# Patient Record
Sex: Male | Born: 1955
Health system: Southern US, Community
[De-identification: ages and names within clinical notes are randomized; demographics above are authoritative.]

## PROBLEM LIST (undated history)

## (undated) DIAGNOSIS — I10 Essential (primary) hypertension: Secondary | ICD-10-CM

## (undated) DIAGNOSIS — M1990 Unspecified osteoarthritis, unspecified site: Secondary | ICD-10-CM

## (undated) DIAGNOSIS — I639 Cerebral infarction, unspecified: Secondary | ICD-10-CM

## (undated) DIAGNOSIS — E785 Hyperlipidemia, unspecified: Secondary | ICD-10-CM

## (undated) DIAGNOSIS — F17200 Nicotine dependence, unspecified, uncomplicated: Secondary | ICD-10-CM

---

## 2004-09-30 ENCOUNTER — Ambulatory Visit (HOSPITAL_COMMUNITY): Admission: RE | Admit: 2004-09-30 | Discharge: 2004-09-30 | Payer: Self-pay | Admitting: Gastroenterology

## 2012-03-18 ENCOUNTER — Ambulatory Visit: Payer: Self-pay

## 2013-07-08 DIAGNOSIS — I10 Essential (primary) hypertension: Secondary | ICD-10-CM | POA: Diagnosis not present

## 2013-07-08 DIAGNOSIS — E782 Mixed hyperlipidemia: Secondary | ICD-10-CM | POA: Diagnosis not present

## 2013-07-08 DIAGNOSIS — R7309 Other abnormal glucose: Secondary | ICD-10-CM | POA: Diagnosis not present

## 2014-01-07 DIAGNOSIS — R7309 Other abnormal glucose: Secondary | ICD-10-CM | POA: Diagnosis not present

## 2014-01-07 DIAGNOSIS — Z125 Encounter for screening for malignant neoplasm of prostate: Secondary | ICD-10-CM | POA: Diagnosis not present

## 2014-01-07 DIAGNOSIS — I10 Essential (primary) hypertension: Secondary | ICD-10-CM | POA: Diagnosis not present

## 2014-01-07 DIAGNOSIS — M545 Low back pain, unspecified: Secondary | ICD-10-CM | POA: Diagnosis not present

## 2014-01-07 DIAGNOSIS — E782 Mixed hyperlipidemia: Secondary | ICD-10-CM | POA: Diagnosis not present

## 2014-07-10 DIAGNOSIS — R739 Hyperglycemia, unspecified: Secondary | ICD-10-CM | POA: Diagnosis not present

## 2014-07-10 DIAGNOSIS — Z23 Encounter for immunization: Secondary | ICD-10-CM | POA: Diagnosis not present

## 2014-07-10 DIAGNOSIS — M545 Low back pain: Secondary | ICD-10-CM | POA: Diagnosis not present

## 2014-07-10 DIAGNOSIS — Z9181 History of falling: Secondary | ICD-10-CM | POA: Diagnosis not present

## 2014-07-10 DIAGNOSIS — R7309 Other abnormal glucose: Secondary | ICD-10-CM | POA: Diagnosis not present

## 2014-07-10 DIAGNOSIS — E782 Mixed hyperlipidemia: Secondary | ICD-10-CM | POA: Diagnosis not present

## 2014-07-10 DIAGNOSIS — I10 Essential (primary) hypertension: Secondary | ICD-10-CM | POA: Diagnosis not present

## 2015-07-30 DIAGNOSIS — Z5320 Procedure and treatment not carried out because of patient's decision for unspecified reasons: Secondary | ICD-10-CM | POA: Diagnosis not present

## 2015-07-30 DIAGNOSIS — E782 Mixed hyperlipidemia: Secondary | ICD-10-CM | POA: Diagnosis not present

## 2015-07-30 DIAGNOSIS — Z720 Tobacco use: Secondary | ICD-10-CM | POA: Diagnosis not present

## 2015-07-30 DIAGNOSIS — Z6832 Body mass index (BMI) 32.0-32.9, adult: Secondary | ICD-10-CM | POA: Diagnosis not present

## 2015-07-30 DIAGNOSIS — M545 Low back pain: Secondary | ICD-10-CM | POA: Diagnosis not present

## 2015-07-30 DIAGNOSIS — Z1389 Encounter for screening for other disorder: Secondary | ICD-10-CM | POA: Diagnosis not present

## 2015-07-30 DIAGNOSIS — I10 Essential (primary) hypertension: Secondary | ICD-10-CM | POA: Diagnosis not present

## 2015-07-30 DIAGNOSIS — R7303 Prediabetes: Secondary | ICD-10-CM | POA: Diagnosis not present

## 2015-07-30 DIAGNOSIS — E669 Obesity, unspecified: Secondary | ICD-10-CM | POA: Diagnosis not present

## 2015-07-30 DIAGNOSIS — Z23 Encounter for immunization: Secondary | ICD-10-CM | POA: Diagnosis not present

## 2016-03-07 DIAGNOSIS — I10 Essential (primary) hypertension: Secondary | ICD-10-CM | POA: Diagnosis not present

## 2016-03-07 DIAGNOSIS — Z720 Tobacco use: Secondary | ICD-10-CM | POA: Diagnosis not present

## 2016-03-07 DIAGNOSIS — F17200 Nicotine dependence, unspecified, uncomplicated: Secondary | ICD-10-CM | POA: Diagnosis not present

## 2016-03-07 DIAGNOSIS — Z79899 Other long term (current) drug therapy: Secondary | ICD-10-CM | POA: Diagnosis not present

## 2016-03-07 DIAGNOSIS — E782 Mixed hyperlipidemia: Secondary | ICD-10-CM | POA: Diagnosis not present

## 2016-03-07 DIAGNOSIS — Z6832 Body mass index (BMI) 32.0-32.9, adult: Secondary | ICD-10-CM | POA: Diagnosis not present

## 2016-03-07 DIAGNOSIS — M545 Low back pain: Secondary | ICD-10-CM | POA: Diagnosis not present

## 2016-03-07 DIAGNOSIS — R7303 Prediabetes: Secondary | ICD-10-CM | POA: Diagnosis not present

## 2016-03-07 DIAGNOSIS — Z125 Encounter for screening for malignant neoplasm of prostate: Secondary | ICD-10-CM | POA: Diagnosis not present

## 2016-07-06 DIAGNOSIS — Z23 Encounter for immunization: Secondary | ICD-10-CM | POA: Diagnosis not present

## 2016-10-25 DIAGNOSIS — Z9181 History of falling: Secondary | ICD-10-CM | POA: Diagnosis not present

## 2016-10-25 DIAGNOSIS — I10 Essential (primary) hypertension: Secondary | ICD-10-CM | POA: Diagnosis not present

## 2016-10-25 DIAGNOSIS — Z1389 Encounter for screening for other disorder: Secondary | ICD-10-CM | POA: Diagnosis not present

## 2016-10-25 DIAGNOSIS — Z6832 Body mass index (BMI) 32.0-32.9, adult: Secondary | ICD-10-CM | POA: Diagnosis not present

## 2016-10-25 DIAGNOSIS — M545 Low back pain: Secondary | ICD-10-CM | POA: Diagnosis not present

## 2016-10-25 DIAGNOSIS — E669 Obesity, unspecified: Secondary | ICD-10-CM | POA: Diagnosis not present

## 2016-10-25 DIAGNOSIS — R7303 Prediabetes: Secondary | ICD-10-CM | POA: Diagnosis not present

## 2016-10-25 DIAGNOSIS — Z720 Tobacco use: Secondary | ICD-10-CM | POA: Diagnosis not present

## 2016-10-25 DIAGNOSIS — E782 Mixed hyperlipidemia: Secondary | ICD-10-CM | POA: Diagnosis not present

## 2017-05-15 DIAGNOSIS — Z1389 Encounter for screening for other disorder: Secondary | ICD-10-CM | POA: Diagnosis not present

## 2017-05-15 DIAGNOSIS — M545 Low back pain: Secondary | ICD-10-CM | POA: Diagnosis not present

## 2017-05-15 DIAGNOSIS — Z0000 Encounter for general adult medical examination without abnormal findings: Secondary | ICD-10-CM | POA: Diagnosis not present

## 2017-05-15 DIAGNOSIS — R7303 Prediabetes: Secondary | ICD-10-CM | POA: Diagnosis not present

## 2017-05-15 DIAGNOSIS — Z136 Encounter for screening for cardiovascular disorders: Secondary | ICD-10-CM | POA: Diagnosis not present

## 2017-05-15 DIAGNOSIS — E785 Hyperlipidemia, unspecified: Secondary | ICD-10-CM | POA: Diagnosis not present

## 2017-05-15 DIAGNOSIS — Z6832 Body mass index (BMI) 32.0-32.9, adult: Secondary | ICD-10-CM | POA: Diagnosis not present

## 2017-05-15 DIAGNOSIS — Z125 Encounter for screening for malignant neoplasm of prostate: Secondary | ICD-10-CM | POA: Diagnosis not present

## 2017-05-15 DIAGNOSIS — E782 Mixed hyperlipidemia: Secondary | ICD-10-CM | POA: Diagnosis not present

## 2017-05-15 DIAGNOSIS — E669 Obesity, unspecified: Secondary | ICD-10-CM | POA: Diagnosis not present

## 2017-05-15 DIAGNOSIS — I10 Essential (primary) hypertension: Secondary | ICD-10-CM | POA: Diagnosis not present

## 2017-06-08 DIAGNOSIS — Z23 Encounter for immunization: Secondary | ICD-10-CM | POA: Diagnosis not present

## 2017-12-04 DIAGNOSIS — E782 Mixed hyperlipidemia: Secondary | ICD-10-CM | POA: Diagnosis not present

## 2017-12-04 DIAGNOSIS — Z79899 Other long term (current) drug therapy: Secondary | ICD-10-CM | POA: Diagnosis not present

## 2017-12-12 DIAGNOSIS — R7303 Prediabetes: Secondary | ICD-10-CM | POA: Diagnosis not present

## 2017-12-12 DIAGNOSIS — M545 Low back pain: Secondary | ICD-10-CM | POA: Diagnosis not present

## 2017-12-12 DIAGNOSIS — E782 Mixed hyperlipidemia: Secondary | ICD-10-CM | POA: Diagnosis not present

## 2017-12-12 DIAGNOSIS — I10 Essential (primary) hypertension: Secondary | ICD-10-CM | POA: Diagnosis not present

## 2018-06-06 DIAGNOSIS — E782 Mixed hyperlipidemia: Secondary | ICD-10-CM | POA: Diagnosis not present

## 2018-06-06 DIAGNOSIS — Z79899 Other long term (current) drug therapy: Secondary | ICD-10-CM | POA: Diagnosis not present

## 2018-06-06 DIAGNOSIS — Z125 Encounter for screening for malignant neoplasm of prostate: Secondary | ICD-10-CM | POA: Diagnosis not present

## 2018-06-26 DIAGNOSIS — I10 Essential (primary) hypertension: Secondary | ICD-10-CM | POA: Diagnosis not present

## 2018-06-26 DIAGNOSIS — E876 Hypokalemia: Secondary | ICD-10-CM | POA: Diagnosis not present

## 2018-06-26 DIAGNOSIS — E782 Mixed hyperlipidemia: Secondary | ICD-10-CM | POA: Diagnosis not present

## 2018-06-26 DIAGNOSIS — R7303 Prediabetes: Secondary | ICD-10-CM | POA: Diagnosis not present

## 2018-06-26 DIAGNOSIS — Z1331 Encounter for screening for depression: Secondary | ICD-10-CM | POA: Diagnosis not present

## 2018-12-23 DIAGNOSIS — Z79899 Other long term (current) drug therapy: Secondary | ICD-10-CM | POA: Diagnosis not present

## 2018-12-23 DIAGNOSIS — E782 Mixed hyperlipidemia: Secondary | ICD-10-CM | POA: Diagnosis not present

## 2018-12-26 DIAGNOSIS — E782 Mixed hyperlipidemia: Secondary | ICD-10-CM | POA: Diagnosis not present

## 2018-12-26 DIAGNOSIS — M545 Low back pain: Secondary | ICD-10-CM | POA: Diagnosis not present

## 2018-12-26 DIAGNOSIS — R7303 Prediabetes: Secondary | ICD-10-CM | POA: Diagnosis not present

## 2018-12-26 DIAGNOSIS — I10 Essential (primary) hypertension: Secondary | ICD-10-CM | POA: Diagnosis not present

## 2019-06-18 ENCOUNTER — Other Ambulatory Visit: Payer: Self-pay

## 2019-09-16 DIAGNOSIS — J358 Other chronic diseases of tonsils and adenoids: Secondary | ICD-10-CM | POA: Diagnosis not present

## 2019-09-16 DIAGNOSIS — J029 Acute pharyngitis, unspecified: Secondary | ICD-10-CM | POA: Diagnosis not present

## 2019-09-16 DIAGNOSIS — Z20822 Contact with and (suspected) exposure to covid-19: Secondary | ICD-10-CM | POA: Diagnosis not present

## 2019-10-08 DIAGNOSIS — Z125 Encounter for screening for malignant neoplasm of prostate: Secondary | ICD-10-CM | POA: Diagnosis not present

## 2019-10-08 DIAGNOSIS — E782 Mixed hyperlipidemia: Secondary | ICD-10-CM | POA: Diagnosis not present

## 2019-10-08 DIAGNOSIS — I10 Essential (primary) hypertension: Secondary | ICD-10-CM | POA: Diagnosis not present

## 2019-10-08 DIAGNOSIS — R7303 Prediabetes: Secondary | ICD-10-CM | POA: Diagnosis not present

## 2019-10-08 DIAGNOSIS — Z79899 Other long term (current) drug therapy: Secondary | ICD-10-CM | POA: Diagnosis not present

## 2019-10-08 DIAGNOSIS — M545 Low back pain: Secondary | ICD-10-CM | POA: Diagnosis not present

## 2019-10-09 DIAGNOSIS — Z0000 Encounter for general adult medical examination without abnormal findings: Secondary | ICD-10-CM | POA: Diagnosis not present

## 2019-10-09 DIAGNOSIS — Z1211 Encounter for screening for malignant neoplasm of colon: Secondary | ICD-10-CM | POA: Diagnosis not present

## 2019-10-09 DIAGNOSIS — Z125 Encounter for screening for malignant neoplasm of prostate: Secondary | ICD-10-CM | POA: Diagnosis not present

## 2019-10-09 DIAGNOSIS — E785 Hyperlipidemia, unspecified: Secondary | ICD-10-CM | POA: Diagnosis not present

## 2019-10-09 DIAGNOSIS — Z6832 Body mass index (BMI) 32.0-32.9, adult: Secondary | ICD-10-CM | POA: Diagnosis not present

## 2019-10-09 DIAGNOSIS — Z9181 History of falling: Secondary | ICD-10-CM | POA: Diagnosis not present

## 2019-10-09 DIAGNOSIS — Z1331 Encounter for screening for depression: Secondary | ICD-10-CM | POA: Diagnosis not present

## 2020-01-27 DIAGNOSIS — Z1159 Encounter for screening for other viral diseases: Secondary | ICD-10-CM | POA: Diagnosis not present

## 2020-05-28 DIAGNOSIS — M545 Low back pain, unspecified: Secondary | ICD-10-CM | POA: Diagnosis not present

## 2020-05-28 DIAGNOSIS — E782 Mixed hyperlipidemia: Secondary | ICD-10-CM | POA: Diagnosis not present

## 2020-05-28 DIAGNOSIS — E669 Obesity, unspecified: Secondary | ICD-10-CM | POA: Diagnosis not present

## 2020-05-28 DIAGNOSIS — Z6832 Body mass index (BMI) 32.0-32.9, adult: Secondary | ICD-10-CM | POA: Diagnosis not present

## 2020-05-28 DIAGNOSIS — I10 Essential (primary) hypertension: Secondary | ICD-10-CM | POA: Diagnosis not present

## 2020-05-28 DIAGNOSIS — Z79899 Other long term (current) drug therapy: Secondary | ICD-10-CM | POA: Diagnosis not present

## 2020-05-28 DIAGNOSIS — R7303 Prediabetes: Secondary | ICD-10-CM | POA: Diagnosis not present

## 2020-05-28 DIAGNOSIS — Z23 Encounter for immunization: Secondary | ICD-10-CM | POA: Diagnosis not present

## 2020-05-28 DIAGNOSIS — Z720 Tobacco use: Secondary | ICD-10-CM | POA: Diagnosis not present

## 2020-10-11 DIAGNOSIS — E669 Obesity, unspecified: Secondary | ICD-10-CM | POA: Diagnosis not present

## 2020-10-11 DIAGNOSIS — Z1331 Encounter for screening for depression: Secondary | ICD-10-CM | POA: Diagnosis not present

## 2020-10-11 DIAGNOSIS — Z9181 History of falling: Secondary | ICD-10-CM | POA: Diagnosis not present

## 2020-10-11 DIAGNOSIS — Z0000 Encounter for general adult medical examination without abnormal findings: Secondary | ICD-10-CM | POA: Diagnosis not present

## 2020-10-11 DIAGNOSIS — E785 Hyperlipidemia, unspecified: Secondary | ICD-10-CM | POA: Diagnosis not present

## 2020-11-17 DIAGNOSIS — E669 Obesity, unspecified: Secondary | ICD-10-CM | POA: Diagnosis not present

## 2020-11-17 DIAGNOSIS — I10 Essential (primary) hypertension: Secondary | ICD-10-CM | POA: Diagnosis not present

## 2020-11-17 DIAGNOSIS — Z125 Encounter for screening for malignant neoplasm of prostate: Secondary | ICD-10-CM | POA: Diagnosis not present

## 2020-11-17 DIAGNOSIS — M545 Low back pain, unspecified: Secondary | ICD-10-CM | POA: Diagnosis not present

## 2020-11-17 DIAGNOSIS — Z79899 Other long term (current) drug therapy: Secondary | ICD-10-CM | POA: Diagnosis not present

## 2020-11-17 DIAGNOSIS — Z139 Encounter for screening, unspecified: Secondary | ICD-10-CM | POA: Diagnosis not present

## 2020-11-17 DIAGNOSIS — E782 Mixed hyperlipidemia: Secondary | ICD-10-CM | POA: Diagnosis not present

## 2020-11-17 DIAGNOSIS — Z720 Tobacco use: Secondary | ICD-10-CM | POA: Diagnosis not present

## 2021-05-10 ENCOUNTER — Emergency Department: Payer: Medicare Other

## 2021-05-10 ENCOUNTER — Inpatient Hospital Stay
Admission: EM | Admit: 2021-05-10 | Discharge: 2021-05-13 | DRG: 481 | Disposition: A | Discharge status: Skilled Nursing Facility | Payer: Medicare Other | Attending: Internal Medicine | Admitting: Internal Medicine

## 2021-05-10 ENCOUNTER — Other Ambulatory Visit: Payer: Self-pay

## 2021-05-10 ENCOUNTER — Encounter: Payer: Self-pay | Admitting: Internal Medicine

## 2021-05-10 DIAGNOSIS — D62 Acute posthemorrhagic anemia: Secondary | ICD-10-CM | POA: Diagnosis not present

## 2021-05-10 DIAGNOSIS — R5381 Other malaise: Secondary | ICD-10-CM | POA: Diagnosis not present

## 2021-05-10 DIAGNOSIS — E569 Vitamin deficiency, unspecified: Secondary | ICD-10-CM | POA: Diagnosis not present

## 2021-05-10 DIAGNOSIS — Z4789 Encounter for other orthopedic aftercare: Secondary | ICD-10-CM | POA: Diagnosis not present

## 2021-05-10 DIAGNOSIS — S7222XA Displaced subtrochanteric fracture of left femur, initial encounter for closed fracture: Principal | ICD-10-CM

## 2021-05-10 DIAGNOSIS — Z79899 Other long term (current) drug therapy: Secondary | ICD-10-CM | POA: Diagnosis not present

## 2021-05-10 DIAGNOSIS — R0902 Hypoxemia: Secondary | ICD-10-CM | POA: Diagnosis not present

## 2021-05-10 DIAGNOSIS — M25552 Pain in left hip: Secondary | ICD-10-CM | POA: Diagnosis not present

## 2021-05-10 DIAGNOSIS — S72142D Displaced intertrochanteric fracture of left femur, subsequent encounter for closed fracture with routine healing: Secondary | ICD-10-CM | POA: Diagnosis not present

## 2021-05-10 DIAGNOSIS — E876 Hypokalemia: Secondary | ICD-10-CM

## 2021-05-10 DIAGNOSIS — Y92008 Other place in unspecified non-institutional (private) residence as the place of occurrence of the external cause: Secondary | ICD-10-CM

## 2021-05-10 DIAGNOSIS — R52 Pain, unspecified: Secondary | ICD-10-CM | POA: Diagnosis not present

## 2021-05-10 DIAGNOSIS — I10 Essential (primary) hypertension: Secondary | ICD-10-CM | POA: Diagnosis present

## 2021-05-10 DIAGNOSIS — M6259 Muscle wasting and atrophy, not elsewhere classified, multiple sites: Secondary | ICD-10-CM | POA: Diagnosis not present

## 2021-05-10 DIAGNOSIS — Z7401 Bed confinement status: Secondary | ICD-10-CM | POA: Diagnosis not present

## 2021-05-10 DIAGNOSIS — S72142A Displaced intertrochanteric fracture of left femur, initial encounter for closed fracture: Secondary | ICD-10-CM | POA: Diagnosis not present

## 2021-05-10 DIAGNOSIS — D638 Anemia in other chronic diseases classified elsewhere: Secondary | ICD-10-CM | POA: Diagnosis present

## 2021-05-10 DIAGNOSIS — F17200 Nicotine dependence, unspecified, uncomplicated: Secondary | ICD-10-CM | POA: Diagnosis present

## 2021-05-10 DIAGNOSIS — W010XXA Fall on same level from slipping, tripping and stumbling without subsequent striking against object, initial encounter: Secondary | ICD-10-CM | POA: Diagnosis present

## 2021-05-10 DIAGNOSIS — Z043 Encounter for examination and observation following other accident: Secondary | ICD-10-CM | POA: Diagnosis not present

## 2021-05-10 DIAGNOSIS — Z8249 Family history of ischemic heart disease and other diseases of the circulatory system: Secondary | ICD-10-CM

## 2021-05-10 DIAGNOSIS — S7222XD Displaced subtrochanteric fracture of left femur, subsequent encounter for closed fracture with routine healing: Secondary | ICD-10-CM | POA: Diagnosis not present

## 2021-05-10 DIAGNOSIS — E871 Hypo-osmolality and hyponatremia: Secondary | ICD-10-CM | POA: Diagnosis present

## 2021-05-10 DIAGNOSIS — R2681 Unsteadiness on feet: Secondary | ICD-10-CM | POA: Diagnosis not present

## 2021-05-10 DIAGNOSIS — S72142K Displaced intertrochanteric fracture of left femur, subsequent encounter for closed fracture with nonunion: Secondary | ICD-10-CM | POA: Diagnosis not present

## 2021-05-10 DIAGNOSIS — W19XXXA Unspecified fall, initial encounter: Secondary | ICD-10-CM

## 2021-05-10 DIAGNOSIS — Z419 Encounter for procedure for purposes other than remedying health state, unspecified: Secondary | ICD-10-CM

## 2021-05-10 DIAGNOSIS — E782 Mixed hyperlipidemia: Secondary | ICD-10-CM | POA: Diagnosis present

## 2021-05-10 DIAGNOSIS — F17210 Nicotine dependence, cigarettes, uncomplicated: Secondary | ICD-10-CM | POA: Diagnosis present

## 2021-05-10 DIAGNOSIS — M14852 Arthropathies in other specified diseases classified elsewhere, left hip: Secondary | ICD-10-CM | POA: Diagnosis not present

## 2021-05-10 DIAGNOSIS — E7849 Other hyperlipidemia: Secondary | ICD-10-CM | POA: Diagnosis not present

## 2021-05-10 DIAGNOSIS — R9431 Abnormal electrocardiogram [ECG] [EKG]: Secondary | ICD-10-CM | POA: Diagnosis not present

## 2021-05-10 DIAGNOSIS — Z7982 Long term (current) use of aspirin: Secondary | ICD-10-CM | POA: Diagnosis not present

## 2021-05-10 DIAGNOSIS — Z20822 Contact with and (suspected) exposure to covid-19: Secondary | ICD-10-CM | POA: Diagnosis present

## 2021-05-10 DIAGNOSIS — S72302A Unspecified fracture of shaft of left femur, initial encounter for closed fracture: Secondary | ICD-10-CM

## 2021-05-10 DIAGNOSIS — R531 Weakness: Secondary | ICD-10-CM | POA: Diagnosis not present

## 2021-05-10 HISTORY — DX: Hyperlipidemia, unspecified: E78.5

## 2021-05-10 HISTORY — DX: Essential (primary) hypertension: I10

## 2021-05-10 LAB — CBC WITH DIFFERENTIAL/PLATELET
Abs Immature Granulocytes: 0.03 10*3/uL (ref 0.00–0.07)
Basophils Absolute: 0 10*3/uL (ref 0.0–0.1)
Basophils Relative: 1 %
Eosinophils Absolute: 0.3 10*3/uL (ref 0.0–0.5)
Eosinophils Relative: 4 %
HCT: 37.1 % — ABNORMAL LOW (ref 39.0–52.0)
Hemoglobin: 12.8 g/dL — ABNORMAL LOW (ref 13.0–17.0)
Immature Granulocytes: 0 %
Lymphocytes Relative: 21 %
Lymphs Abs: 1.5 10*3/uL (ref 0.7–4.0)
MCH: 32.7 pg (ref 26.0–34.0)
MCHC: 34.5 g/dL (ref 30.0–36.0)
MCV: 94.6 fL (ref 80.0–100.0)
Monocytes Absolute: 0.3 10*3/uL (ref 0.1–1.0)
Monocytes Relative: 4 %
Neutro Abs: 5.2 10*3/uL (ref 1.7–7.7)
Neutrophils Relative %: 70 %
Platelets: 314 10*3/uL (ref 150–400)
RBC: 3.92 MIL/uL — ABNORMAL LOW (ref 4.22–5.81)
RDW: 13.1 % (ref 11.5–15.5)
WBC: 7.4 10*3/uL (ref 4.0–10.5)
nRBC: 0 % (ref 0.0–0.2)

## 2021-05-10 LAB — MAGNESIUM: Magnesium: 1.9 mg/dL (ref 1.7–2.4)

## 2021-05-10 LAB — COMPREHENSIVE METABOLIC PANEL
ALT: 12 U/L (ref 0–44)
AST: 16 U/L (ref 15–41)
Albumin: 3.7 g/dL (ref 3.5–5.0)
Alkaline Phosphatase: 86 U/L (ref 38–126)
Anion gap: 6 (ref 5–15)
BUN: 8 mg/dL (ref 8–23)
CO2: 31 mmol/L (ref 22–32)
Calcium: 8.6 mg/dL — ABNORMAL LOW (ref 8.9–10.3)
Chloride: 95 mmol/L — ABNORMAL LOW (ref 98–111)
Creatinine, Ser: 0.78 mg/dL (ref 0.61–1.24)
GFR, Estimated: >60 mL/min (ref >60)
Glucose, Bld: 136 mg/dL — ABNORMAL HIGH (ref 70–99)
Potassium: 3 mmol/L — ABNORMAL LOW (ref 3.5–5.1)
Sodium: 132 mmol/L — ABNORMAL LOW (ref 135–145)
Total Bilirubin: 0.6 mg/dL (ref 0.3–1.2)
Total Protein: 7.4 g/dL (ref 6.5–8.1)

## 2021-05-10 LAB — TYPE AND SCREEN
ABO/RH(D): O POS
Antibody Screen: NEGATIVE

## 2021-05-10 LAB — RESP PANEL BY RT-PCR (FLU A&B, COVID) ARPGX2
Influenza A by PCR: NEGATIVE
Influenza B by PCR: NEGATIVE
SARS Coronavirus 2 by RT PCR: NEGATIVE

## 2021-05-10 MED ORDER — ALBUTEROL SULFATE (2.5 MG/3ML) 0.083% IN NEBU
2.5000 mg | INHALATION_SOLUTION | Freq: Four times a day (QID) | RESPIRATORY_TRACT | Status: DC
  Administered 2021-05-10 – 2021-05-11 (×2): 2.5 mg via RESPIRATORY_TRACT
  Filled 2021-05-10 (×3): qty 3

## 2021-05-10 MED ORDER — ONDANSETRON HCL 4 MG/2ML IJ SOLN: 4.0000 mg | Freq: Four times a day (QID) | INTRAMUSCULAR | Status: DC | PRN

## 2021-05-10 MED ORDER — BISACODYL 5 MG PO TBEC: 5.0000 mg | DELAYED_RELEASE_TABLET | Freq: Every day | ORAL | Status: DC | PRN

## 2021-05-10 MED ORDER — ATORVASTATIN CALCIUM 20 MG PO TABS
20.0000 mg | ORAL_TABLET | Freq: Every day | ORAL | Status: DC
  Administered 2021-05-11 – 2021-05-12 (×3): 20 mg via ORAL
  Filled 2021-05-10 (×4): qty 1

## 2021-05-10 MED ORDER — ACETAMINOPHEN 650 MG RE SUPP: 650.0000 mg | Freq: Four times a day (QID) | RECTAL | Status: DC | PRN

## 2021-05-10 MED ORDER — METOPROLOL TARTRATE 5 MG/5ML IV SOLN: 5.0000 mg | Freq: Four times a day (QID) | INTRAVENOUS | Status: DC | PRN

## 2021-05-10 MED ORDER — CYCLOBENZAPRINE HCL 10 MG PO TABS
10.0000 mg | ORAL_TABLET | Freq: Three times a day (TID) | ORAL | Status: DC | PRN
  Administered 2021-05-11 – 2021-05-12 (×3): 10 mg via ORAL
  Filled 2021-05-10 (×3): qty 1

## 2021-05-10 MED ORDER — MAGNESIUM OXIDE -MG SUPPLEMENT 400 (240 MG) MG PO TABS
400.0000 mg | ORAL_TABLET | Freq: Every day | ORAL | Status: DC
  Administered 2021-05-12 – 2021-05-13 (×2): 400 mg via ORAL
  Filled 2021-05-10 (×2): qty 1

## 2021-05-10 MED ORDER — MAGNESIUM SULFATE IN D5W 1-5 GM/100ML-% IV SOLN
1.0000 g | Freq: Once | INTRAVENOUS | Status: AC
  Administered 2021-05-11: 1 g via INTRAVENOUS
  Filled 2021-05-10 (×2): qty 100

## 2021-05-10 MED ORDER — METOPROLOL TARTRATE 50 MG PO TABS
100.0000 mg | ORAL_TABLET | Freq: Two times a day (BID) | ORAL | Status: DC
  Administered 2021-05-10 – 2021-05-12 (×4): 100 mg via ORAL
  Filled 2021-05-10 (×6): qty 2

## 2021-05-10 MED ORDER — MORPHINE SULFATE (PF) 2 MG/ML IV SOLN
2.0000 mg | INTRAVENOUS | Status: DC | PRN
  Administered 2021-05-10 – 2021-05-11 (×3): 2 mg via INTRAVENOUS
  Filled 2021-05-10 (×3): qty 1

## 2021-05-10 MED ORDER — ONDANSETRON HCL 4 MG PO TABS: 4.0000 mg | ORAL_TABLET | Freq: Four times a day (QID) | ORAL | Status: DC | PRN

## 2021-05-10 MED ORDER — POTASSIUM CHLORIDE CRYS ER 20 MEQ PO TBCR
40.0000 meq | EXTENDED_RELEASE_TABLET | Freq: Once | ORAL | Status: AC
  Administered 2021-05-10: 40 meq via ORAL
  Filled 2021-05-10: qty 2

## 2021-05-10 MED ORDER — THIAMINE HCL 100 MG PO TABS
100.0000 mg | ORAL_TABLET | Freq: Every day | ORAL | Status: DC
  Administered 2021-05-10 – 2021-05-13 (×3): 100 mg via ORAL
  Filled 2021-05-10 (×3): qty 1

## 2021-05-10 MED ORDER — HYDROCODONE-ACETAMINOPHEN 5-325 MG PO TABS
1.0000 | ORAL_TABLET | ORAL | Status: DC | PRN
  Administered 2021-05-11: 2 via ORAL
  Filled 2021-05-10 (×2): qty 2

## 2021-05-10 MED ORDER — SENNOSIDES-DOCUSATE SODIUM 8.6-50 MG PO TABS: 1.0000 | ORAL_TABLET | Freq: Every evening | ORAL | Status: DC | PRN

## 2021-05-10 MED ORDER — MORPHINE SULFATE (PF) 4 MG/ML IV SOLN
4.0000 mg | Freq: Once | INTRAVENOUS | Status: AC
  Administered 2021-05-10: 4 mg via INTRAVENOUS
  Filled 2021-05-10: qty 1

## 2021-05-10 MED ORDER — ACETAMINOPHEN 325 MG PO TABS: 650.0000 mg | ORAL_TABLET | Freq: Four times a day (QID) | ORAL | Status: DC | PRN

## 2021-05-10 MED ORDER — CEFAZOLIN SODIUM-DEXTROSE 2-4 GM/100ML-% IV SOLN
2.0000 g | INTRAVENOUS | Status: AC
  Administered 2021-05-11: 2 g via INTRAVENOUS
  Filled 2021-05-10 (×2): qty 100

## 2021-05-10 NOTE — ED Notes (Signed)
Surgeon at bedside. Pt will have surgery tomorrow at 1300. NPO after midnight tonight.

## 2021-05-10 NOTE — Consult Note (Signed)
ORTHOPAEDIC CONSULTATION  REQUESTING PHYSICIAN: Marlow Baars, MD  Chief Complaint:   Left hip/thigh pain  History of Present Illness: Jonathon Anderson is a 65 y.o. male with a history of hypertension hyperlipidemia who lives independently and ambulates without any assistive devices.  The patient was in his usual state of health this afternoon when he apparently slipped and fell while going back into his house, landing on his left side.  He was unable to get up and so was brought to the emergency room by EMS where x-rays have demonstrated a displaced subtrochanteric left hip fracture.  The patient denies any associated injuries.  He did not strike his head or lose consciousness.  The patient also denies any lightheadedness, dizziness, chest pain, shortness of breath, or other symptoms which may have precipitated his fall.  Past Medical History:  Diagnosis Date   Hyperlipidemia    Hypertension    History reviewed. No pertinent surgical history. Social History   Socioeconomic History   Marital status: Single    Spouse name: Not on file   Number of children: Not on file   Years of education: Not on file   Highest education level: Not on file  Occupational History   Not on file  Tobacco Use   Smoking status: Not on file   Smokeless tobacco: Not on file  Substance and Sexual Activity   Alcohol use: Not on file   Drug use: Not on file   Sexual activity: Not on file  Other Topics Concern   Not on file  Social History Narrative   Not on file   Social Determinants of Health   Financial Resource Strain: Not on file  Food Insecurity: Not on file  Transportation Needs: Not on file  Physical Activity: Not on file  Stress: Not on file  Social Connections: Not on file   History reviewed. No pertinent family history. No Known Allergies Prior to Admission medications   Medication Sig Start Date End Date Taking?  Authorizing Provider  aspirin EC 81 MG tablet Take 81 mg by mouth daily. Swallow whole.   Yes [provider]  atorvastatin (LIPITOR) 20 MG tablet Take 20 mg by mouth at bedtime. 11/28/20  Yes [provider]  cyclobenzaprine (FLEXERIL) 10 MG tablet Take 10 mg by mouth 2 (two) times daily as needed. 02/24/21  Yes [provider]  diclofenac (VOLTAREN) 75 MG EC tablet Take 75 mg by mouth 2 (two) times daily. 02/24/21  Yes [provider]  metoprolol tartrate (LOPRESSOR) 100 MG tablet Take 100 mg by mouth 2 (two) times daily. 02/24/21  Yes [provider]  triamterene-hydrochlorothiazide (MAXZIDE) 75-50 MG tablet Take 1 tablet by mouth every morning. 02/24/21  Yes [provider]   DG Chest Portable 1 View  Result Date: 05/10/2021 CLINICAL DATA:  Fall EXAM: PORTABLE CHEST 1 VIEW COMPARISON:  None. FINDINGS: The heart size and mediastinal contours are within normal limits. Both lungs are clear. The visualized skeletal structures are unremarkable. IMPRESSION: No active disease. Electronically Signed   By: Jasmine Pang M.D.   On: 05/10/2021 16:53   DG Hip Unilat With Pelvis 2-3 Views Left  Result Date: 05/10/2021 CLINICAL DATA:  Fall with hip pain EXAM: DG HIP (WITH OR WITHOUT PELVIS) 2-3V LEFT COMPARISON:  None. FINDINGS: There is an acute subtrochanteric fracture on the left with approximately 1.0 cm medial displacement, 1.0 cm anterior displacement, and slight posterior angulation of the dominant distal fragment. Femoroacetabular alignment is maintained. There is no  fracture on the right. The SI joints and symphysis pubis are intact. IMPRESSION: Acute mildly displaced left subtrochanteric fracture. Electronically Signed   By: Lesia Hausen M.D.   On: 05/10/2021 16:53    Positive ROS: All other systems have been reviewed and were otherwise negative with the exception of those mentioned in the HPI and as above.  Physical Exam: General:  Alert, no acute  distress Psychiatric:  Patient is competent for consent with normal mood and affect   Cardiovascular:  No pedal edema Respiratory:  No wheezing, non-labored breathing GI:  Abdomen is soft and non-tender Skin:  No lesions in the area of chief complaint Neurologic:  Sensation intact distally Lymphatic:  No axillary or cervical lymphadenopathy  Orthopedic Exam:  Orthopedic examination is limited to the left hip and lower extremity.  Skin inspection around the left hip is unremarkable.  The left lower extremity is somewhat shortened and externally rotated as compared to the right.  No swelling, erythema, ecchymosis, abrasions, or other skin abnormalities are identified.  He has mild tenderness to palpation over the anterior and lateral aspects of the left hip.  He has more severe pain with any attempted active or passive motion of the left hip.  He is neurovascularly intact to the left lower extremity and foot.  X-rays:  Recent x-rays of the pelvis and left hip are available for review and have been reviewed by myself.  These films demonstrate a 2-part mildly displaced subtrochanteric fracture of the left hip.  No significant degenerative changes of the left hip joint are noted.  No lytic lesions or other acute bony abnormalities are identified.  Assessment: Closed displaced left subtrochanteric hip fracture.  Plan: The treatment options, including both surgical and nonsurgical choices, have been discussed in detail with the patient and his family.  The patient and his family would like to proceed with surgical intervention to include an intramedullary nailing of the displaced left subtrochanteric hip fracture.  The risks (including bleeding, infection, nerve and/or blood vessel injury, persistent or recurrent pain, loosening or failure of the components, leg length inequality, dislocation, need for further surgery, blood clots, strokes, heart attacks or arrhythmias, pneumonia, etc.) and benefits of  the surgical procedure were discussed.  The patient states his understanding and agrees to proceed.  He agrees to a blood transfusion if necessary.  A formal written consent will be obtained by the nursing staff.  Thank you for asking me to participate in the care of this most pleasant yet unfortunate man.  I will be happy to follow him with you.   Maryagnes Amos, MD  Beeper #:  [phone]  05/10/2021 5:37 PM

## 2021-05-10 NOTE — H&P (Signed)
History and Physical    Jonathon Anderson IRC:[id number] DOB: 01/07/1956 DOA: 05/10/2021  PCP: Pcp, No Dr. Rob Bunting Patient coming from: Home  I have personally briefly reviewed patient's [facility].  Chief Complaint: Left hip pain after fall at home  HPI: Jonathon Anderson is a 65 y.o. male with medical history significant for hypertension, hyperlipidemia, smoker, who presents to the emergency department on 05/10/2021 with left hip pain after a fall at home. He slipped on the floor at home and landed on his left hip on the stairs. He did not hit his head. He had instant pain in the left hip, does not radiate, is up to 7/10 at times and is characterized as sharp. He was unable to stand or walk. Symptoms are alleviated by nothing and exacerbated by attempting to stand. Associated symptoms: No fever, chills, chest pain, palpitations, shortness of breath. He has chronic occasional cough but has not noticed wheezing.  ED Course: X-rays of the left hip show an acute mildly displaced left subtrochanteric fracture.  Orthopedic surgeon, Dr. Joice Lofts, was consulted by the emergency department; he evaluated the patient and plan is for surgery in the afternoon on 05/11/2021.  Review of Systems: As per HPI otherwise all other systems reviewed and are unremarable. CARDIOVASCULAR: No chest pain or palpitations.  GI: No abdominal pain, nausea, vomiting, diarrhea, constipation, or bloody stool.  NEUROLOGICAL: No headache or focal weakness.    Past Medical History:  Diagnosis Date   Hyperlipidemia    Hypertension     History reviewed. No pertinent surgical history. Patient has never had surgery before.   Social History   Social History   Tobacco Use   Smoking status: Every Day    Packs/day: 0.50    Types: Cigarettes   Smokeless tobacco: Never  Substance Use Topics   Alcohol use: Not Currently    Comment: Previously heavy alcohol use 4-5 cans of beer/day but  no alchol since 10/2020.   Drug use: None.   No Known Allergies  Family History  Problem Relation Age of Onset   Heart attack Mother    Hypertension Brother    Heart failure Paternal Grandfather      Home Medications  Prior to Admission medications   Medication Sig Start Date End Date Taking? Authorizing Provider  aspirin EC 81 MG tablet Take 81 mg by mouth daily. Swallow whole.   Yes [provider]  atorvastatin (LIPITOR) 20 MG tablet Take 20 mg by mouth at bedtime. 11/28/20  Yes [provider]  cyclobenzaprine (FLEXERIL) 10 MG tablet Take 10 mg by mouth 2 (two) times daily as needed. 02/24/21  Yes [provider]  diclofenac (VOLTAREN) 75 MG EC tablet Take 75 mg by mouth 2 (two) times daily. 02/24/21  Yes [provider]  metoprolol tartrate (LOPRESSOR) 100 MG tablet Take 100 mg by mouth 2 (two) times daily. 02/24/21  Yes [provider]  triamterene-hydrochlorothiazide (MAXZIDE) 75-50 MG tablet Take 1 tablet by mouth every morning. 02/24/21  Yes [provider]    Physical Exam: Vitals:   05/10/21 1530 05/10/21 1535 05/10/21 1545  BP:  (!) 145/85 (!) 147/90  Pulse:  72 69  Resp:  16 13  Temp:  97.6 F (36.4 C)   TempSrc:  Oral   SpO2:  96% 95%  Weight: 97.5 kg    Height: 5\' 8"  (1.727 m)      Constitutional: NAD, calm, comfortable, ill-appearing. Vitals:   05/10/21  1530 05/10/21 1535 05/10/21 1545  BP:  (!) 145/85 (!) 147/90  Pulse:  72 69  Resp:  16 13  Temp:  97.6 F (36.4 C)   TempSrc:  Oral   SpO2:  96% 95%  Weight: 97.5 kg    Height: 5\' 8"  (1.727 m)     Eyes: Pupils equal and round, lids and conjunctivae without icterus or erythema. ENMT: Mucous membranes are dry. Posterior pharynx clear of any exudate or lesions. Nares patent without discharge or bleeding.  Normocephalic, atraumatic.  Poor dentition.  Neck: normal, supple, no masses, trachea midline.  Thyroid nontender, no masses appreciated, no  thyromegaly. Respiratory: clear to auscultation bilaterally. Chest wall movements are symmetric. No rales. Bilateral wheezing and coarse breath sounds. Occasional cough. Normal respiratory effort. No accessory muscle use.  Cardiovascular: Regular rate and rhythm, no murmurs / rubs / gallops. Pulses: DP pulses 2+ bilaterally. No carotid bruits.  Capillary refill less than 3 seconds. Edema: None bilaterally. GI: soft, non-distended, normal active bowel sounds. No hepatosplenomegaly. No rigidity, rebound, or guarding. Non-tender. No masses palpated.  Musculoskeletal: no clubbing / cyanosis. No joint deformity upper and lower extremities. Good ROM, no contractures. Normal muscle tone.  No tenderness or deformity in the upper extremities bilaterally. Tenderness in the left hip and left femur. Mild tenderness in the left knee. Left leg is shortened and externally rotated.  Integument: no rashes, lesions, ulcers. No induration. Clean, dry, intact. Neurologic: CN 2-12 grossly intact. Sensation grossly intact to light touch. DTR 2+ bilaterally.  Babinski: Toes downgoing bilaterally.  Strength 5/5 in all, except left lower extremity not tested due to fracture.  Intact rapid alternating movements bilaterally.  No pronator drift. Psychiatric: Normal judgment and insight. Alert and oriented x 3. Normal mood.  Normal and appropriate affect. Lymphatic: No cervical lymphadenopathy. No supraclavicular lymphadenopathy.   Labs on Admission: I have personally reviewed the following labs and imaging studies.  CBC: Recent Labs  Lab 05/10/21 1552  WBC 7.4  NEUTROABS 5.2  HGB 12.8*  HCT 37.1*  MCV 94.6  PLT 314    Basic Metabolic Panel: Recent Labs  Lab 05/10/21 1552  NA 132*  K 3.0*  CL 95*  CO2 31  GLUCOSE 136*  BUN 8  CREATININE 0.78  CALCIUM 8.6*    GFR: Estimated Creatinine Clearance: 101.4 mL/min (by C-G formula based on SCr of 0.78 mg/dL).  Liver Function Tests: Recent Labs  Lab  05/10/21 1552  AST 16  ALT 12  ALKPHOS 86  BILITOT 0.6  PROT 7.4  ALBUMIN 3.7    Radiological Exams on Admission: DG Chest Portable 1 View  Result Date: 05/10/2021 CLINICAL DATA:  Fall EXAM: PORTABLE CHEST 1 VIEW COMPARISON:  None. FINDINGS: The heart size and mediastinal contours are within normal limits. Both lungs are clear. The visualized skeletal structures are unremarkable. IMPRESSION: No active disease. Electronically Signed   By: 05/12/2021 M.D.   On: 05/10/2021 16:53   DG Hip Unilat With Pelvis 2-3 Views Left  Result Date: 05/10/2021 CLINICAL DATA:  Fall with hip pain EXAM: DG HIP (WITH OR WITHOUT PELVIS) 2-3V LEFT COMPARISON:  None. FINDINGS: There is an acute subtrochanteric fracture on the left with approximately 1.0 cm medial displacement, 1.0 cm anterior displacement, and slight posterior angulation of the dominant distal fragment. Femoroacetabular alignment is maintained. There is no fracture on the right. The SI joints and symphysis pubis are intact. IMPRESSION: Acute mildly displaced left subtrochanteric fracture. Electronically Signed   By: 05/12/2021  Noone M.D.   On: 05/10/2021 16:53    EKG: Independently reviewed.  70 bpm.  Sinus rhythm.  Significant artifact especially in the leads V4-V6.    Assessment/Plan  Principal Problem:    Closed subtrochanteric fracture of hip, left, initial encounter (HCC) Not on any blood thinners other than aspirin does take diclofenac at home, which will not be continued while he is hospitalized. Plan: IV morphine as needed for pain.  Appreciate orthopedic surgery consult.  Plan is for surgery on 05/11/2021.   Active Problems:    Primary hypertension Plan: Continue home metoprolol.  We will hold home triamterene hydrochlorothiazide because patient has low potassium level and because he may need some IV fluid because he will be n.p.o. for his surgery.    Hypokalemia Plan: Replace.  Check magnesium and replace if needed.     Cigarette nicotine dependence, uncomplicated Plan: Counseled to quit.  Nicotine patch given.  Patient also had wheezing on exam with a chest x-ray showing no acute process; will give albuterol nebs.    Mixed hyperlipidemia Plan: Continue home atorvastatin.     DVT prophylaxis: SCDs. Code Status:   Full Code  Disposition Plan:   Patient is from:  Home  Anticipated DC to:  Home  Anticipated DC date:  10/22.22.  Anticipated DC barriers: Pain, surgery.  Consults called:  Dr. Joice Lofts, Orthopedic surgery  Admission status:  Inpatient   Severity of Illness: The appropriate patient status for this patient is INPATIENT. Inpatient status is judged to be reasonable and necessary in order to provide the required intensity of service to ensure the patient's safety. The patient's presenting symptoms, physical exam findings, and initial radiographic and laboratory data in the context of their chronic comorbidities is felt to place them at high risk for further clinical deterioration. Furthermore, it is not anticipated that the patient will be medically stable for discharge from the hospital within 2 midnights of admission.  Patient has an acute left hip fracture and is unable to stand.  He requires IV morphine, IV fluids, surgical treatment.  * I certify that at the point of admission it is my clinical judgment that the patient will require inpatient hospital care spanning beyond 2 midnights from the point of admission due to high intensity of service, high risk for further deterioration and high frequency of surveillance required.Marlow Baars MD [facility]  How to contact the [facility] Attending or Consulting provider 7A - 7P or covering provider during after hours 7P -7A, for this patient?   Check the care team in [facility] and look for a) attending/consulting [facility] provider listed and b) the [facility] team listed Log into [email or web address] and use [place]'s universal password to access. If you do not  have the password, please contact the hospital operator. Locate the [facility] provider you are looking for under [facility] and page to a number that you can be directly reached. If you still have difficulty reaching the provider, please page the [facility] (Director on Call) for the Hospitalists listed on amion for assistance.  05/10/2021, 5:34 PM

## 2021-05-10 NOTE — ED Triage Notes (Signed)
Pt to ED from home, [facility]  Pt slipped and fell back while getting into house  Shortening and external rotation L leg  20g IV fentanyl by EMS, now 3/10 pain, VS normal  Alert and oriented

## 2021-05-10 NOTE — ED Provider Notes (Signed)
[facility] Emergency Department Provider Note ____________________________________________   Event Date/Time   First MD Initiated Contact with Patient 05/10/21 1535     (approximate)  I have reviewed the triage vital signs and the nursing notes.  HISTORY  Chief Complaint Fall and Hip Pain   HPI Jonathon Anderson is a 65 y.o. malewho presents to the ED for evaluation of hip pain after a fall.   Chart review indicates history of HTN, HLD.  ASA 81 is only thinner.  Patient presents to the ED from home via EMS for evaluation of mechanical fall and left hip pain.  He reports being in his typical state of health, until developing a mechanical fall this afternoon.  He was outside, conversing with his daughter, when he turned, lost his footing and struck his left hip directly on concrete steps.  Denies head injury, syncope or additional site of pain beyond his left hip.  Reports 6/10 intensity pain that is constant and nonradiating.  No past medical history on file.  There are no problems to display for this patient.    Prior to Admission medications   Not on File    Allergies Patient has no known allergies.  No family history on file.  Social History    Review of Systems  Constitutional: No fever/chills Eyes: No visual changes. ENT: No sore throat. Cardiovascular: Denies chest pain. Respiratory: Denies shortness of breath. Gastrointestinal: No abdominal pain.  No nausea, no vomiting.  No diarrhea.  No constipation. Genitourinary: Negative for dysuria. Musculoskeletal: Negative for back pain. Positive for left hip pain after a fall Skin: Negative for rash. Neurological: Negative for headaches, focal weakness or numbness.   ____________________________________________   PHYSICAL EXAM:  VITAL SIGNS: Vitals:   05/10/21 1535 05/10/21 1545  BP: (!) 145/85 (!) 147/90  Pulse: 72 69  Resp: 16 13  Temp: 97.6 F (36.4 C)   SpO2: 96% 95%      Constitutional: Alert and oriented. Well appearing and in no acute distress. Eyes: Conjunctivae are normal. PERRL. EOMI. Head: Atraumatic. Nose: No congestion/rhinnorhea. Mouth/Throat: Mucous membranes are moist.  Oropharynx non-erythematous. Neck: No stridor. No cervical spine tenderness to palpation. Cardiovascular: Normal rate, regular rhythm. Grossly normal heart sounds.  Good peripheral circulation. Respiratory: Normal respiratory effort.  No retractions. Lungs CTAB. Gastrointestinal: Soft , nondistended, nontender to palpation. No CVA tenderness. Musculoskeletal: Left leg is shortened and externally rotated.  Pain with logrolling of the left lower extremity.  No signs of deformity or trauma to the left knee, shin, ankle/foot. Other 3 extremities have no deformity or evidence of acute trauma. Neurologic:  Normal speech and language. No gross focal neurologic deficits are appreciated.  Skin:  Skin is warm, dry and intact. No rash noted. Psychiatric: Mood and affect are normal. Speech and behavior are normal.  ____________________________________________   LABS (all labs ordered are listed, but only abnormal results are displayed)  Labs Reviewed  COMPREHENSIVE METABOLIC PANEL - Abnormal; Notable for the following components:      Result Value   Sodium 132 (*)    Potassium 3.0 (*)    Chloride 95 (*)    Glucose, Bld 136 (*)    Calcium 8.6 (*)    All other components within normal limits  CBC WITH DIFFERENTIAL/PLATELET - Abnormal; Notable for the following components:   RBC 3.92 (*)    Hemoglobin 12.8 (*)    HCT 37.1 (*)    All other components within normal limits  TYPE AND  SCREEN   ____________________________________________  12 Lead EKG  Sinus rhythm, rate of 70 bpm.  Normal axis and intervals.  No evidence of acute ischemia. ____________________________________________  RADIOLOGY  ED MD interpretation: Plain film of the pelvis and left hip reviewed by me with  subtrochanteric fracture with some displacement. CXR reviewed by me without evidence of acute cardiopulmonary pathology.  Official radiology report(s): No results found.  ____________________________________________   PROCEDURES and INTERVENTIONS  Procedure(s) performed (including Critical Care):  .1-3 Lead EKG Interpretation Performed by: Delton Prairie, MD Authorized by: Delton Prairie, MD     Interpretation: normal     ECG rate:  70   ECG rate assessment: normal     Rhythm: sinus rhythm     Ectopy: none     Conduction: normal    Medications  morphine 4 MG/ML injection 4 mg (has no administration in time range)  potassium chloride SA (KLOR-CON) CR tablet 40 mEq (has no administration in time range)    ____________________________________________   MDM / ED COURSE   Fairly healthy and functional 65 year old male presents to the ED after mechanical fall with a displaced subtrochanteric fracture on the left.  Exam with clinical hip fracture due to shortening and external rotation of the left leg.  No evidence of open injury or trauma beyond his left hip.  No evidence of neurologic or vascular deficits.  Blood work with mild hypokalemia that was repleted orally.  Plain film confirms subtrochanteric fracture on the left.  We will discuss with Ortho regarding fixation and anticipate medical admission.  Clinical Course as of 05/10/21 Jonathon Anderson  Tue May 10, 2021  1555 Reviewed plan of care with the patient my suspicion for hip fracture.  We discussed work-up, imaging and the possibility of surgical intervention.  He is in agreement. [DS]  1657 Reassessed and updated patient. [DS]  1716 I discuss with Dr. Joice Lofts, he reviews XR, tomorrow morning.  [DS]    Clinical Course User Index [DS] Delton Prairie, MD    ____________________________________________   FINAL CLINICAL IMPRESSION(S) / ED DIAGNOSES  Final diagnoses:  Fall, initial encounter  Hypokalemia  Left hip pain  Closed  displaced subtrochanteric fracture of left femur, initial encounter [facility])     ED Discharge Orders     None        Jonathon Anderson   Note:  This document was prepared using Dragon voice recognition software and may include unintentional dictation errors.    Delton Prairie, MD 05/10/21 Jonathon Anderson

## 2021-05-10 NOTE — ED Notes (Signed)
Dr. Smith at bedside.

## 2021-05-11 ENCOUNTER — Inpatient Hospital Stay: Payer: Medicare Other

## 2021-05-11 ENCOUNTER — Encounter: Payer: Self-pay | Admitting: Internal Medicine

## 2021-05-11 ENCOUNTER — Inpatient Hospital Stay: Payer: Medicare Other | Admitting: Anesthesiology

## 2021-05-11 ENCOUNTER — Encounter
Admission: EM | Disposition: A | Discharge status: Skilled Nursing Facility | Payer: Self-pay | Source: Home / Self Care | Attending: Internal Medicine

## 2021-05-11 HISTORY — PX: INTRAMEDULLARY (IM) NAIL INTERTROCHANTERIC: SHX5875

## 2021-05-11 LAB — SURGICAL PCR SCREEN
MRSA, PCR: NEGATIVE
Staphylococcus aureus: POSITIVE — AB

## 2021-05-11 LAB — CBC
HCT: 39.7 % (ref 39.0–52.0)
Hemoglobin: 13.3 g/dL (ref 13.0–17.0)
MCH: 31.5 pg (ref 26.0–34.0)
MCHC: 33.5 g/dL (ref 30.0–36.0)
MCV: 94.1 fL (ref 80.0–100.0)
Platelets: 340 10*3/uL (ref 150–400)
RBC: 4.22 MIL/uL (ref 4.22–5.81)
RDW: 12.9 % (ref 11.5–15.5)
WBC: 9 10*3/uL (ref 4.0–10.5)
nRBC: 0 % (ref 0.0–0.2)

## 2021-05-11 LAB — PROTIME-INR
INR: 1.1 (ref 0.8–1.2)
Prothrombin Time: 13.7 seconds (ref 11.4–15.2)

## 2021-05-11 LAB — BASIC METABOLIC PANEL
Anion gap: 11 (ref 5–15)
BUN: 7 mg/dL — ABNORMAL LOW (ref 8–23)
CO2: 27 mmol/L (ref 22–32)
Calcium: 8.9 mg/dL (ref 8.9–10.3)
Chloride: 96 mmol/L — ABNORMAL LOW (ref 98–111)
Creatinine, Ser: 0.67 mg/dL (ref 0.61–1.24)
GFR, Estimated: >60 mL/min (ref >60)
Glucose, Bld: 108 mg/dL — ABNORMAL HIGH (ref 70–99)
Potassium: 3.8 mmol/L (ref 3.5–5.1)
Sodium: 134 mmol/L — ABNORMAL LOW (ref 135–145)

## 2021-05-11 LAB — APTT: aPTT: 30 seconds (ref 24–36)

## 2021-05-11 LAB — HIV ANTIBODY (ROUTINE TESTING W REFLEX): HIV Screen 4th Generation wRfx: NONREACTIVE

## 2021-05-11 SURGERY — FIXATION, FRACTURE, INTERTROCHANTERIC, WITH INTRAMEDULLARY ROD
Anesthesia: General | Site: Leg Upper | Laterality: Left

## 2021-05-11 MED ORDER — LACTATED RINGERS IV SOLN: INTRAVENOUS | Status: DC | PRN

## 2021-05-11 MED ORDER — FLEET ENEMA 7-19 GM/118ML RE ENEM: 1.0000 | ENEMA | Freq: Once | RECTAL | Status: DC | PRN

## 2021-05-11 MED ORDER — METOCLOPRAMIDE HCL 5 MG/ML IJ SOLN: 5.0000 mg | Freq: Three times a day (TID) | INTRAMUSCULAR | Status: DC | PRN

## 2021-05-11 MED ORDER — DOCUSATE SODIUM 100 MG PO CAPS
100.0000 mg | ORAL_CAPSULE | Freq: Two times a day (BID) | ORAL | Status: DC
  Administered 2021-05-11 – 2021-05-13 (×4): 100 mg via ORAL
  Filled 2021-05-11 (×4): qty 1

## 2021-05-11 MED ORDER — BUPIVACAINE IN DEXTROSE 0.75-8.25 % IT SOLN
INTRATHECAL | Status: DC | PRN
  Administered 2021-05-11: 2 mL via INTRATHECAL

## 2021-05-11 MED ORDER — CEFAZOLIN SODIUM-DEXTROSE 2-4 GM/100ML-% IV SOLN
2.0000 g | Freq: Four times a day (QID) | INTRAVENOUS | Status: AC
  Administered 2021-05-11 – 2021-05-12 (×3): 2 g via INTRAVENOUS
  Filled 2021-05-11 (×3): qty 100

## 2021-05-11 MED ORDER — DIPHENHYDRAMINE HCL 12.5 MG/5ML PO ELIX: 12.5000 mg | ORAL_SOLUTION | ORAL | Status: DC | PRN

## 2021-05-11 MED ORDER — LACTATED RINGERS IV SOLN: INTRAVENOUS | Status: DC

## 2021-05-11 MED ORDER — DEXAMETHASONE SODIUM PHOSPHATE 10 MG/ML IJ SOLN
INTRAMUSCULAR | Status: DC | PRN
  Administered 2021-05-11: 10 mg via INTRAVENOUS

## 2021-05-11 MED ORDER — 0.9 % SODIUM CHLORIDE (POUR BTL) OPTIME
TOPICAL | Status: DC | PRN
  Administered 2021-05-11: 200 mL

## 2021-05-11 MED ORDER — KETAMINE HCL 50 MG/5ML IJ SOSY
PREFILLED_SYRINGE | INTRAMUSCULAR | Status: AC
  Filled 2021-05-11: qty 5

## 2021-05-11 MED ORDER — CEFAZOLIN SODIUM-DEXTROSE 2-4 GM/100ML-% IV SOLN
INTRAVENOUS | Status: AC
  Filled 2021-05-11: qty 100

## 2021-05-11 MED ORDER — ONDANSETRON HCL 4 MG/2ML IJ SOLN
INTRAMUSCULAR | Status: AC
  Filled 2021-05-11: qty 2

## 2021-05-11 MED ORDER — ACETAMINOPHEN 10 MG/ML IV SOLN
INTRAVENOUS | Status: DC | PRN
  Administered 2021-05-11: 1000 mg via INTRAVENOUS

## 2021-05-11 MED ORDER — KETAMINE HCL 50 MG/ML IJ SOLN
INTRAMUSCULAR | Status: DC | PRN
  Administered 2021-05-11 (×2): 10 mg via INTRAMUSCULAR

## 2021-05-11 MED ORDER — CHLORHEXIDINE GLUCONATE CLOTH 2 % EX PADS
6.0000 | MEDICATED_PAD | Freq: Every day | CUTANEOUS | Status: DC
  Administered 2021-05-12 – 2021-05-13 (×2): 6 via TOPICAL

## 2021-05-11 MED ORDER — PROPOFOL 500 MG/50ML IV EMUL
INTRAVENOUS | Status: DC | PRN
  Administered 2021-05-11: 25 ug/kg/min via INTRAVENOUS

## 2021-05-11 MED ORDER — LACTATED RINGERS IV BOLUS
250.0000 mL | INTRAVENOUS | Status: AC
  Administered 2021-05-11: 250 mL via INTRAVENOUS

## 2021-05-11 MED ORDER — ACETAMINOPHEN 10 MG/ML IV SOLN
INTRAVENOUS | Status: AC
  Filled 2021-05-11: qty 100

## 2021-05-11 MED ORDER — METOCLOPRAMIDE HCL 10 MG PO TABS: 5.0000 mg | ORAL_TABLET | Freq: Three times a day (TID) | ORAL | Status: DC | PRN

## 2021-05-11 MED ORDER — MIDAZOLAM HCL 5 MG/5ML IJ SOLN
INTRAMUSCULAR | Status: DC | PRN
  Administered 2021-05-11: 2 mg via INTRAVENOUS

## 2021-05-11 MED ORDER — KETOROLAC TROMETHAMINE 15 MG/ML IJ SOLN
7.5000 mg | Freq: Four times a day (QID) | INTRAMUSCULAR | Status: AC
  Administered 2021-05-12 (×3): 7.5 mg via INTRAVENOUS
  Filled 2021-05-11 (×3): qty 1

## 2021-05-11 MED ORDER — TRAMADOL HCL 50 MG PO TABS: 50.0000 mg | ORAL_TABLET | Freq: Four times a day (QID) | ORAL | Status: DC | PRN

## 2021-05-11 MED ORDER — BUPIVACAINE-EPINEPHRINE (PF) 0.5% -1:200000 IJ SOLN
INTRAMUSCULAR | Status: DC | PRN
  Administered 2021-05-11: 30 mL via PERINEURAL

## 2021-05-11 MED ORDER — FENTANYL CITRATE (PF) 100 MCG/2ML IJ SOLN: 25.0000 ug | INTRAMUSCULAR | Status: DC | PRN

## 2021-05-11 MED ORDER — KETOROLAC TROMETHAMINE 15 MG/ML IJ SOLN: 15.0000 mg | Freq: Once | INTRAMUSCULAR | Status: AC

## 2021-05-11 MED ORDER — MAGNESIUM HYDROXIDE 400 MG/5ML PO SUSP
30.0000 mL | Freq: Every day | ORAL | Status: DC | PRN
  Administered 2021-05-11 – 2021-05-12 (×2): 30 mL via ORAL
  Filled 2021-05-11 (×3): qty 30

## 2021-05-11 MED ORDER — KETOROLAC TROMETHAMINE 15 MG/ML IJ SOLN
INTRAMUSCULAR | Status: AC
  Administered 2021-05-11: 15 mg via INTRAVENOUS
  Filled 2021-05-11: qty 1

## 2021-05-11 MED ORDER — PHENYLEPHRINE HCL (PRESSORS) 10 MG/ML IV SOLN
INTRAVENOUS | Status: DC | PRN
  Administered 2021-05-11 (×3): 80 ug via INTRAVENOUS

## 2021-05-11 MED ORDER — MUPIROCIN 2 % EX OINT
1.0000 "application " | TOPICAL_OINTMENT | Freq: Two times a day (BID) | CUTANEOUS | Status: DC
  Administered 2021-05-11 – 2021-05-13 (×4): 1 via NASAL
  Filled 2021-05-11: qty 22

## 2021-05-11 MED ORDER — FENTANYL CITRATE (PF) 100 MCG/2ML IJ SOLN
INTRAMUSCULAR | Status: DC | PRN
  Administered 2021-05-11 (×2): 25 ug via INTRAVENOUS

## 2021-05-11 MED ORDER — ENOXAPARIN SODIUM 40 MG/0.4ML IJ SOSY
40.0000 mg | PREFILLED_SYRINGE | INTRAMUSCULAR | Status: DC
  Administered 2021-05-12 – 2021-05-13 (×2): 40 mg via SUBCUTANEOUS
  Filled 2021-05-11 (×2): qty 0.4

## 2021-05-11 MED ORDER — ONDANSETRON HCL 4 MG/2ML IJ SOLN
4.0000 mg | Freq: Four times a day (QID) | INTRAMUSCULAR | Status: DC | PRN
  Administered 2021-05-11: 4 mg via INTRAVENOUS
  Filled 2021-05-11: qty 2

## 2021-05-11 MED ORDER — DEXAMETHASONE SODIUM PHOSPHATE 10 MG/ML IJ SOLN
INTRAMUSCULAR | Status: AC
  Filled 2021-05-11: qty 1

## 2021-05-11 MED ORDER — ACETAMINOPHEN 325 MG PO TABS: 325.0000 mg | ORAL_TABLET | Freq: Four times a day (QID) | ORAL | Status: DC | PRN

## 2021-05-11 MED ORDER — VASOPRESSIN 20 UNIT/ML IV SOLN
INTRAVENOUS | Status: AC
  Filled 2021-05-11: qty 1

## 2021-05-11 MED ORDER — BUPIVACAINE-EPINEPHRINE (PF) 0.5% -1:200000 IJ SOLN
INTRAMUSCULAR | Status: AC
  Filled 2021-05-11: qty 30

## 2021-05-11 MED ORDER — ACETAMINOPHEN 10 MG/ML IV SOLN: 1000.0000 mg | Freq: Once | INTRAVENOUS | Status: DC | PRN

## 2021-05-11 MED ORDER — LACTATED RINGERS IV BOLUS
250.0000 mL | Freq: Once | INTRAVENOUS | Status: AC | PRN
  Administered 2021-05-11: 250 mL via INTRAVENOUS

## 2021-05-11 MED ORDER — FENTANYL CITRATE (PF) 100 MCG/2ML IJ SOLN
INTRAMUSCULAR | Status: AC
  Filled 2021-05-11: qty 2

## 2021-05-11 MED ORDER — ONDANSETRON HCL 4 MG/2ML IJ SOLN
INTRAMUSCULAR | Status: DC | PRN
  Administered 2021-05-11: 4 mg via INTRAVENOUS

## 2021-05-11 MED ORDER — ONDANSETRON HCL 4 MG/2ML IJ SOLN: 4.0000 mg | Freq: Once | INTRAMUSCULAR | Status: DC | PRN

## 2021-05-11 MED ORDER — POTASSIUM CHLORIDE IN NACL 20-0.9 MEQ/L-% IV SOLN
INTRAVENOUS | Status: DC
  Filled 2021-05-11 (×2): qty 1000

## 2021-05-11 MED ORDER — ACETAMINOPHEN 500 MG PO TABS
500.0000 mg | ORAL_TABLET | Freq: Four times a day (QID) | ORAL | Status: AC
  Administered 2021-05-11 – 2021-05-12 (×3): 500 mg via ORAL
  Filled 2021-05-11 (×3): qty 1

## 2021-05-11 MED ORDER — MIDAZOLAM HCL 2 MG/2ML IJ SOLN
INTRAMUSCULAR | Status: AC
  Filled 2021-05-11: qty 2

## 2021-05-11 MED ORDER — ONDANSETRON HCL 4 MG PO TABS
4.0000 mg | ORAL_TABLET | Freq: Four times a day (QID) | ORAL | Status: DC | PRN
  Administered 2021-05-12: 4 mg via ORAL
  Filled 2021-05-11: qty 1

## 2021-05-11 MED ORDER — PHENYLEPHRINE HCL-NACL 20-0.9 MG/250ML-% IV SOLN
INTRAVENOUS | Status: DC | PRN
  Administered 2021-05-11: 40 ug/min via INTRAVENOUS

## 2021-05-11 MED ORDER — BISACODYL 10 MG RE SUPP: 10.0000 mg | Freq: Every day | RECTAL | Status: DC | PRN

## 2021-05-11 MED ORDER — DEXMEDETOMIDINE HCL IN NACL 400 MCG/100ML IV SOLN
INTRAVENOUS | Status: DC | PRN
  Administered 2021-05-11: 8 ug via INTRAVENOUS
  Administered 2021-05-11: 4 ug via INTRAVENOUS
  Administered 2021-05-11: 8 ug via INTRAVENOUS

## 2021-05-11 MED ORDER — HYDROCODONE-ACETAMINOPHEN 5-325 MG PO TABS
1.0000 | ORAL_TABLET | ORAL | Status: DC | PRN
  Administered 2021-05-11: 1 via ORAL
  Administered 2021-05-12 (×2): 2 via ORAL
  Administered 2021-05-13: 1 via ORAL
  Administered 2021-05-13: 2 via ORAL
  Filled 2021-05-11 (×3): qty 2
  Filled 2021-05-11: qty 1

## 2021-05-11 MED ORDER — VASOPRESSIN 20 UNIT/ML IV SOLN
INTRAVENOUS | Status: DC | PRN
  Administered 2021-05-11: 1 [IU] via INTRAVENOUS
  Administered 2021-05-11 (×2): 2 [IU] via INTRAVENOUS
  Administered 2021-05-11: 1 [IU] via INTRAVENOUS
  Administered 2021-05-11: 2 [IU] via INTRAVENOUS

## 2021-05-11 MED ORDER — PROPOFOL 10 MG/ML IV BOLUS
INTRAVENOUS | Status: AC
  Filled 2021-05-11: qty 20

## 2021-05-11 MED ORDER — PHENYLEPHRINE HCL-NACL 20-0.9 MG/250ML-% IV SOLN
INTRAVENOUS | Status: AC
  Filled 2021-05-11: qty 250

## 2021-05-11 MED ORDER — OXYCODONE HCL 5 MG/5ML PO SOLN
5.0000 mg | Freq: Once | ORAL | Status: DC | PRN
Start: 2021-05-11 — End: 2021-05-11

## 2021-05-11 MED ORDER — OXYCODONE HCL 5 MG PO TABS
5.0000 mg | ORAL_TABLET | Freq: Once | ORAL | Status: DC | PRN
Start: 2021-05-11 — End: 2021-05-11

## 2021-05-11 MED ORDER — ALBUTEROL SULFATE (2.5 MG/3ML) 0.083% IN NEBU: 2.5000 mg | INHALATION_SOLUTION | RESPIRATORY_TRACT | Status: DC | PRN

## 2021-05-11 SURGICAL SUPPLY — 50 items
BIT DRILL 4.3MMS DISTAL GRDTED (BIT) ×1 IMPLANT
BNDG COHESIVE 4X5 TAN ST LF (GAUZE/BANDAGES/DRESSINGS) ×2 IMPLANT
BNDG COHESIVE 6X5 TAN ST LF (GAUZE/BANDAGES/DRESSINGS) ×2 IMPLANT
CHLORAPREP W/TINT 26 (MISCELLANEOUS) ×4 IMPLANT
CORTICAL BONE SCR 5.0MM X 48MM (Screw) ×4 IMPLANT
DRAPE 3/4 80X56 (DRAPES) ×2 IMPLANT
DRAPE C-ARMOR (DRAPES) ×2 IMPLANT
DRILL 4.3MMS DISTAL GRADUATED (BIT) ×2
DRSG MEPILEX SACRM 8.7X9.8 (GAUZE/BANDAGES/DRESSINGS) ×2 IMPLANT
DRSG OPSITE POSTOP 3X4 (GAUZE/BANDAGES/DRESSINGS) IMPLANT
DRSG OPSITE POSTOP 4X6 (GAUZE/BANDAGES/DRESSINGS) IMPLANT
DRSG OPSITE POSTOP 4X8 (GAUZE/BANDAGES/DRESSINGS) ×2 IMPLANT
ELECT CAUTERY BLADE 6.4 (BLADE) ×2 IMPLANT
ELECT REM PT RETURN 9FT ADLT (ELECTROSURGICAL) ×2
ELECTRODE REM PT RTRN 9FT ADLT (ELECTROSURGICAL) ×1 IMPLANT
GAUZE 4X4 16PLY ~~LOC~~+RFID DBL (SPONGE) ×2 IMPLANT
GAUZE SPONGE 4X4 12PLY STRL (GAUZE/BANDAGES/DRESSINGS) ×2 IMPLANT
GLOVE SURG ENC MOIS LTX SZ8 (GLOVE) ×4 IMPLANT
GLOVE SURG UNDER LTX SZ8 (GLOVE) ×2 IMPLANT
GOWN STRL REUS W/ TWL LRG LVL3 (GOWN DISPOSABLE) ×1 IMPLANT
GOWN STRL REUS W/ TWL XL LVL3 (GOWN DISPOSABLE) ×1 IMPLANT
GOWN STRL REUS W/TWL LRG LVL3 (GOWN DISPOSABLE) ×2
GOWN STRL REUS W/TWL XL LVL3 (GOWN DISPOSABLE) ×2
GUIDEPIN VERSANAIL DSP 3.2X444 (ORTHOPEDIC DISPOSABLE SUPPLIES) ×2 IMPLANT
GUIDEWIRE BALL NOSE 100CM (WIRE) ×2 IMPLANT
HFN A/R SCREW 90MM (Screw) ×2 IMPLANT
HIP FR NAIL LAG SCREW 10.5X110 (Orthopedic Implant) ×2 IMPLANT
MANIFOLD NEPTUNE II (INSTRUMENTS) ×2 IMPLANT
MAT ABSORB  FLUID 56X50 GRAY (MISCELLANEOUS) ×2
MAT ABSORB FLUID 56X50 GRAY (MISCELLANEOUS) ×1 IMPLANT
NAIL HIP FRA AFFIX 130X9X400 L (Nail) ×2 IMPLANT
NEEDLE FILTER BLUNT 18X 1/2SAF (NEEDLE) ×1
NEEDLE FILTER BLUNT 18X1 1/2 (NEEDLE) ×1 IMPLANT
NEEDLE HYPO 22GX1.5 SAFETY (NEEDLE) ×2 IMPLANT
NS IRRIG 500ML POUR BTL (IV SOLUTION) ×2 IMPLANT
PACK HIP COMPR (MISCELLANEOUS) ×2 IMPLANT
SCREW BONE CORTICAL 5.0X44 (Screw) ×2 IMPLANT
SCREW CORTICL BON 5.0MM X 48MM (Screw) ×2 IMPLANT
SCREW DRILL BIT ANIT ROTATION (BIT) ×2 IMPLANT
SCREW LAG HIP FR NAIL 10.5X110 (Orthopedic Implant) ×1 IMPLANT
SPONGE T-LAP 18X18 ~~LOC~~+RFID (SPONGE) ×4 IMPLANT
STAPLER SKIN PROX 35W (STAPLE) ×2 IMPLANT
STRAP SAFETY 5IN WIDE (MISCELLANEOUS) ×2 IMPLANT
SUT VIC AB 0 CT1 36 (SUTURE) ×2 IMPLANT
SUT VIC AB 1 CT1 36 (SUTURE) ×2 IMPLANT
SUT VIC AB 2-0 CT1 (SUTURE) ×4 IMPLANT
SYR 10ML LL (SYRINGE) ×2 IMPLANT
SYR 30ML LL (SYRINGE) ×2 IMPLANT
TAPE MICROFOAM 4IN (TAPE) ×2 IMPLANT
WATER STERILE IRR 500ML POUR (IV SOLUTION) ×2 IMPLANT

## 2021-05-11 NOTE — Plan of Care (Signed)
  Problem: Education: Goal: Knowledge of General Education information will improve Description: Including pain rating scale, medication(s)/side effects and non-pharmacologic comfort measures Outcome: Progressing   Problem: Health Behavior/Discharge Planning: Goal: Ability to manage health-related needs will improve Outcome: Progressing   Problem: Clinical Measurements: Goal: Ability to maintain clinical measurements within normal limits will improve Outcome: Progressing Goal: Will remain free from infection Outcome: Progressing Goal: Respiratory complications will improve Outcome: Progressing   Problem: Activity: Goal: Risk for activity intolerance will decrease Outcome: Progressing   Problem: Nutrition: Goal: Adequate nutrition will be maintained Outcome: Progressing   Problem: Coping: Goal: Level of anxiety will decrease Outcome: Progressing   Problem: Elimination: Goal: Will not experience complications related to bowel motility Outcome: Progressing Goal: Will not experience complications related to urinary retention Outcome: Progressing   Problem: Pain Managment: Goal: General experience of comfort will improve Outcome: Progressing   Problem: Safety: Goal: Ability to remain free from injury will improve Outcome: Progressing   Problem: Skin Integrity: Goal: Risk for impaired skin integrity will decrease Outcome: Progressing   Problem: Education: Goal: Knowledge of the prescribed therapeutic regimen will improve Outcome: Progressing Goal: Understanding of discharge needs will improve Outcome: Progressing   Problem: Activity: Goal: Ability to avoid complications of mobility impairment will improve Outcome: Progressing Goal: Ability to tolerate increased activity will improve Outcome: Progressing   Problem: Clinical Measurements: Goal: Postoperative complications will be avoided or minimized Outcome: Progressing   Problem: Skin Integrity: Goal: Will  show signs of wound healing Outcome: Progressing   Problem: Pain Management: Goal: Pain level will decrease with appropriate interventions Outcome: Progressing

## 2021-05-11 NOTE — Op Note (Signed)
05/10/2021 - 05/11/2021  3:30 PM  Patient:   Jonathon Anderson  Pre-Op Diagnosis:   Displaced subtrochanteric fracture, left hip.  Post-Op Diagnosis:   Same  Procedure:   Reduction and internal fixation of displaced left subtrochanteric hip fracture with Biomet Affixis TFN nail.  Surgeon:   Maryagnes Amos, MD  Assistant:   None  Anesthesia:   Spinal  Findings:   As above  Complications:   None  EBL:   125 cc  Fluids:   1000 cc crystalloid  UOP:   None  TT:   None  Drains:   None  Closure:   Staples  Implants:   Biomet Affixis 9 x 400 mm TFN with a 110 mm lag screw, a 90 mm antirotation screw, and 44 mm and 48 mm distal interlocking screws  Brief Clinical Note:   The patient is a 65 year old male who sustained above-noted injury yesterday afternoon when he apparently tripped and fell while entering his home, landing on his left side. He was brought to the emergency room where x-rays demonstrated the above-noted injury. The patient has been cleared medically and presents at this time for reduction and internal fixation of the displaced subtrochanteric left hip fracture.  Procedure:   The patient was brought into the operating room. After adequate spinal anesthesia was obtained, the patient was lain in the supine position on the fracture table. The uninjured leg was placed in a flexed and abducted position while the injured lower extremity was placed in longitudinal traction. The fracture was reduced using longitudinal traction and internal rotation. The adequacy of reduction was verified fluoroscopically in AP and lateral projections and found to be satisfactorily aligned. The lateral aspects of the left hip and thigh were prepped with ChloraPrep solution before being draped sterilely. Preoperative antibiotics were administered. A timeout was performed to verify the appropriate surgical site.   The greater trochanter was identified fluoroscopically and an approximately 7-8 cm  incision was made about 2-3 fingerbreadths above the tip of the greater trochanter. The incision was carried down through the subcutaneous tissues to expose the gluteal fascia. This was split the length of the incision, providing access to the tip of the trochanter. Under fluoroscopic guidance, a guidewire was drilled through the tip of the trochanter into the proximal metaphysis to the level of the lesser trochanter. After verifying its position fluoroscopically in AP and lateral projections, it was overreamed with the initial reamer to the depth of the lesser trochanter. A guidewire was passed down through the femoral canal to the supracondylar region. The adequacy of guidewire position was verified fluoroscopically in AP and lateral projections before the length of the guidewire within the canal was measured and found to be 410 mm. Therefore, a 400 mm length nail was selected. The guidewire was overreamed sequentially using the flexible reamers, beginning with an 8.5 mm reamer and progressing to a 10.5 mm reamer. This provided good cortical chatter. The 9 x 400 mm Biomet Affixis TFN rod was selected and advanced to the appropriate depth, as verified fluoroscopically.   The guide system for the lag screw was positioned and advanced through an approximately 3 cm stab incision over the lateral aspect of the proximal femur. The guidewire was drilled up through the trochanteric femoral nail and into the femoral neck to rest within 5 mm of subchondral bone. After verifying its position in the femoral neck and head in both AP and lateral projections, the guidewire was measured and found to be optimally  replicated by a 110 mm lag screw. The guidewire was overreamed to the appropriate depth before the lag screw was inserted and advanced to the appropriate depth as verified fluoroscopically in AP and lateral projections. Given that this was a subtrochanteric fracture, it was elected to lock the screw to the rod. In  addition, it was felt best to place a second screw for additional fixation proximal to the fracture. Therefore, a 90 mm antirotation screw was placed and advanced to the appropriate depth as verified by fluoroscopic visualization. The locking screw was advanced, then backed off a quarter turn to set the lag screw. Again the adequacy of hardware position and fracture reduction was verified fluoroscopically in AP and lateral projections and found to be excellent.  Attention was directed distally. Using the "perfect circle" technique, the leg and fluoroscopy machine were positioned appropriately and approximately 1.5 cm stab incision was made over the skin at the appropriate point before the drill bit was advanced to the cortex and across the dynamic hole of these nail bicortically. The appropriate screw length was determined before the 48 mm distal interlocking screw was positioned, then advanced and tightened securely. Given that this was a subtrochanteric fracture, it was felt best that a second screw be placed distally. Therefore, an approximately 1.5 cm stab incision was made over the skin at the appropriate point before the drill bit was advanced through the cortex and across the static hole of the nail. The appropriate length of the screw was determined before the 48 mm distal interlocking screw was positioned, then advanced and tightened securely. Again the adequacy of screw positions were verified fluoroscopically in AP and lateral projections and found to be excellent.  Initially a 48 mm screw was selected for the static hole, but this was clearly too long by fluoroscopic visualization, so it was replaced with a 44 mm screw.  The wounds were irrigated thoroughly with sterile saline solution before the abductor fascia was reapproximated using #1 Vicryl interrupted sutures. The subcutaneous tissues were closed using 2-0 Vicryl interrupted sutures. The skin was closed using staples. A total of 30 cc of 0.5%  Sensorcaine with epinephrine was injected in and around all incisions. Sterile occlusive dressings were applied to all wounds before the patient was transferred back to his hospital bed. The patient was then transferred to the recovery room in satisfactory condition after tolerating the procedure well.

## 2021-05-11 NOTE — Anesthesia Procedure Notes (Signed)
Spinal  Patient location during procedure: OR Start time: 05/11/2021 1:02 PM End time: 05/11/2021 1:06 PM Reason for block: surgical anesthesia Staffing Performed: anesthesiologist  Anesthesiologist: Reed Breech, MD Preanesthetic Checklist Completed: patient identified, IV checked, site marked, risks and benefits discussed, surgical consent, monitors and equipment checked, pre-op evaluation and timeout performed Spinal Block Patient position: right lateral decubitus Prep: ChloraPrep Patient monitoring: heart rate, cardiac monitor, continuous pulse ox and blood pressure Approach: midline Location: L2-3 Injection technique: single-shot Needle Needle type: Sprotte  Needle gauge: 24 G Needle length: 9 cm Assessment Sensory level: T4 Events: CSF return

## 2021-05-11 NOTE — Progress Notes (Signed)
PROGRESS NOTE    Jonathon Anderson  OZD:[id number] DOB: 1956/06/15 DOA: 05/10/2021 PCP: Pcp, No   Brief Narrative: Taken from H&P. Jonathon Anderson is a 65 y.o. male with medical history significant for hypertension, hyperlipidemia, smoker, who presents to the emergency department on 05/10/2021 with left hip pain after a fall at home. He slipped on the floor at home and landed on his left hip on the stairs. Found to have an acute mildly displaced left subtrochanteric fracture.  Orthopedic was consulted and he will be going to the OR later today.  Subjective: Patient was having some pain when seen today but stating that it is bearable at this time.  Waiting for his surgery.  Assessment & Plan:   Principal Problem:   Closed subtrochanteric fracture of hip, left, initial encounter [facility]) Active Problems:   Primary hypertension   Mixed hyperlipidemia   Cigarette nicotine dependence, uncomplicated   Hypokalemia  Left subtrochanteric fracture secondary to mechanical fall.  Going for ORIF with orthopedic later today. -Follow-up postoperative recommendations -Continue with pain management.  Hypertension.  Blood pressure within goal. -Continue with home metoprolol. -Can resume home dose of triamterene HCTZ if needed  Hypokalemia.  Resolved.  Magnesium within normal limit.  Nicotine dependence. -Nicotine patch  Dyslipidemia. -Continue with home dose of atorvastatin  Objective: Vitals:   05/11/21 0700 05/11/21 0800 05/11/21 0911 05/11/21 1221  BP: (!) 143/85 (!) 143/92 (!) 143/88 (!) 126/94  Pulse: 88 92 77 (!) 107  Resp: 14 13  18   Temp:  99.3 F (37.4 C) (!) 97.4 F (36.3 C) 97.8 F (36.6 C)  TempSrc:    Oral  SpO2: 95% 97% 100% 98%  Weight:      Height:        Intake/Output Summary (Last 24 hours) at 05/11/2021 1405 Last data filed at 05/11/2021 0203 Gross per 24 hour  Intake 100 ml  Output --  Net 100 ml   Filed Weights   05/10/21 1530  Weight: 97.5 kg     Examination:  General exam: Appears calm and comfortable  Respiratory system: Clear to auscultation. Respiratory effort normal. Cardiovascular system: S1 & S2 heard, RRR.  Gastrointestinal system: Soft, nontender, nondistended, bowel sounds positive. Central nervous system: Alert and oriented. No focal neurological deficits. Extremities: No edema, no cyanosis, pulses intact and symmetrical. Psychiatry: Judgement and insight appear normal. Mood & affect appropriate.    DVT prophylaxis: Lovenox after the surgery Code Status: Full Family Communication:  Disposition Plan:  Status is: Inpatient  Remains inpatient appropriate because: Of severity of illness.   Level of care: Med-Surg  All the records are reviewed and case discussed with Care Management/Social Worker. Management plans discussed with the patient, nursing and they are in agreement.  Consultants:  Orthopedic surgery  Procedures:  Antimicrobials:   Data Reviewed: I have personally reviewed following labs and imaging studies  CBC: Recent Labs  Lab 05/10/21 1552 05/11/21 0620  WBC 7.4 9.0  NEUTROABS 5.2  --   HGB 12.8* 13.3  HCT 37.1* 39.7  MCV 94.6 94.1  PLT 314 340   Basic Metabolic Panel: Recent Labs  Lab 05/10/21 1552 05/11/21 0620  NA 132* 134*  K 3.0* 3.8  CL 95* 96*  CO2 31 27  GLUCOSE 136* 108*  BUN 8 7*  CREATININE 0.78 0.67  CALCIUM 8.6* 8.9  MG 1.9  --    GFR: Estimated Creatinine Clearance: 101.4 mL/min (by C-G formula based on SCr of 0.67 mg/dL). Liver Function Tests:  Recent Labs  Lab 05/10/21 1552  AST 16  ALT 12  ALKPHOS 86  BILITOT 0.6  PROT 7.4  ALBUMIN 3.7   No results for input(s): LIPASE, AMYLASE in the last 168 hours. No results for input(s): AMMONIA in the last 168 hours. Coagulation Profile: Recent Labs  Lab 05/11/21 0620  INR 1.1   Cardiac Enzymes: No results for input(s): CKTOTAL, CKMB, CKMBINDEX, TROPONINI in the last 168 hours. BNP (last 3  results) No results for input(s): PROBNP in the last 8760 hours. HbA1C: No results for input(s): HGBA1C in the last 72 hours. CBG: No results for input(s): GLUCAP in the last 168 hours. Lipid Profile: No results for input(s): CHOL, HDL, LDLCALC, TRIG, CHOLHDL, LDLDIRECT in the last 72 hours. Thyroid Function Tests: No results for input(s): TSH, T4TOTAL, FREET4, T3FREE, THYROIDAB in the last 72 hours. Anemia Panel: No results for input(s): VITAMINB12, FOLATE, FERRITIN, TIBC, IRON, RETICCTPCT in the last 72 hours. Sepsis Labs: No results for input(s): PROCALCITON, LATICACIDVEN in the last 168 hours.  Recent Results (from the past 240 hour(s))  Resp Panel by RT-PCR (Flu A&B, Covid) Nasopharyngeal Swab     Status: None   Collection Time: 05/10/21  3:45 PM   Specimen: Nasopharyngeal Swab; Nasopharyngeal(NP) swabs in vial transport medium  Result Value Ref Range Status   SARS Coronavirus 2 by RT PCR NEGATIVE NEGATIVE Final    Comment: (NOTE) SARS-CoV-2 target nucleic acids are NOT DETECTED.  The SARS-CoV-2 RNA is generally detectable in upper respiratory specimens during the acute phase of infection. The lowest concentration of SARS-CoV-2 viral copies this assay can detect is 138 copies/mL. A negative result does not preclude SARS-Cov-2 infection and should not be used as the sole basis for treatment or other patient management decisions. A negative result may occur with  improper specimen collection/handling, submission of specimen other than nasopharyngeal swab, presence of viral mutation(s) within the areas targeted by this assay, and inadequate number of viral copies(<138 copies/mL). A negative result must be combined with clinical observations, patient history, and epidemiological information. The expected result is Negative.  Fact Sheet for Patients:  BloggerCourse.com  Fact Sheet for Healthcare Providers:   SeriousBroker.it  This test is no t yet approved or cleared by the [place] FDA and  has been authorized for detection and/or diagnosis of SARS-CoV-2 by FDA under an Emergency Use Authorization (EUA). This EUA will remain  in effect (meaning this test can be used) for the duration of the COVID-19 declaration under Section 564(b)(1) of the Act, 21 U.S.C.section 360bbb-3(b)(1), unless the authorization is terminated  or revoked sooner.       Influenza A by PCR NEGATIVE NEGATIVE Final   Influenza B by PCR NEGATIVE NEGATIVE Final    Comment: (NOTE) The Xpert Xpress SARS-CoV-2/FLU/RSV plus assay is intended as an aid in the diagnosis of influenza from Nasopharyngeal swab specimens and should not be used as a sole basis for treatment. Nasal washings and aspirates are unacceptable for Xpert Xpress SARS-CoV-2/FLU/RSV testing.  Fact Sheet for Patients: BloggerCourse.com  Fact Sheet for Healthcare Providers: SeriousBroker.it  This test is not yet approved or cleared by the [place] FDA and has been authorized for detection and/or diagnosis of SARS-CoV-2 by FDA under an Emergency Use Authorization (EUA). This EUA will remain in effect (meaning this test can be used) for the duration of the COVID-19 declaration under Section 564(b)(1) of the Act, 21 U.S.C. section 360bbb-3(b)(1), unless the authorization is terminated or revoked.  Performed at [facility], [address], [place], [place] [address]   Surgical pcr screen     Status: Abnormal   Collection Time: 05/11/21 10:03 AM   Specimen: Nasal Mucosa; Nasal Swab  Result Value Ref Range Status   MRSA, PCR NEGATIVE NEGATIVE Final   Staphylococcus aureus POSITIVE (A) NEGATIVE Final    Comment: (NOTE) The Xpert SA Assay (FDA approved for NASAL specimens in patients 37 years of age and older), is one component of a  comprehensive surveillance program. It is not intended to diagnose infection nor to guide or monitor treatment. Performed at [facility], [address] [place], [place] [address]      Radiology Studies: DG Chest Portable 1 View  Result Date: 05/10/2021 CLINICAL DATA:  Fall EXAM: PORTABLE CHEST 1 VIEW COMPARISON:  None. FINDINGS: The heart size and mediastinal contours are within normal limits. Both lungs are clear. The visualized skeletal structures are unremarkable. IMPRESSION: No active disease. Electronically Signed   By: Jasmine Pang M.D.   On: 05/10/2021 16:53   DG Hip Unilat With Pelvis 2-3 Views Left  Result Date: 05/10/2021 CLINICAL DATA:  Fall with hip pain EXAM: DG HIP (WITH OR WITHOUT PELVIS) 2-3V LEFT COMPARISON:  None. FINDINGS: There is an acute subtrochanteric fracture on the left with approximately 1.0 cm medial displacement, 1.0 cm anterior displacement, and slight posterior angulation of the dominant distal fragment. Femoroacetabular alignment is maintained. There is no fracture on the right. The SI joints and symphysis pubis are intact. IMPRESSION: Acute mildly displaced left subtrochanteric fracture. Electronically Signed   By: Lesia Hausen M.D.   On: 05/10/2021 16:53    Scheduled Meds:  [MAR Hold] albuterol  2.5 mg Nebulization QID   [MAR Hold] atorvastatin  20 mg Oral QHS   [MAR Hold] Chlorhexidine Gluconate Cloth  6 each Topical Daily   [MAR Hold] magnesium oxide  400 mg Oral Daily   [MAR Hold] metoprolol tartrate  100 mg Oral BID   [MAR Hold] mupirocin ointment  1 application Nasal BID   [MAR Hold] thiamine  100 mg Oral Daily   Continuous Infusions:   LOS: 1 day   Time spent: 40 minutes More than 50% of the time was spent in counseling/coordination of care  Arnetha Courser, MD [facility]  If 7PM-7AM, please contact night-coverage [email or web address]  05/11/2021, 2:05 PM   This record has been created using Manufacturing engineer. Errors have been sought and corrected,but may not always be located. Such creation errors do not reflect on the standard of care.

## 2021-05-11 NOTE — Anesthesia Preprocedure Evaluation (Addendum)
Anesthesia Evaluation  Patient identified by MRN, date of birth, ID band Patient awake    Reviewed: Allergy & Precautions, NPO status , Patient's Chart, lab work & pertinent test results  History of Anesthesia Complications Negative for: history of anesthetic complications  Airway Mallampati: I   Neck ROM: Full    Dental   Few missing and broken teeth; upper partial removed:   Pulmonary Current Smoker (1/3-1/2 ppd) and Patient abstained from smoking.,    Pulmonary exam normal breath sounds clear to auscultation       Cardiovascular hypertension, Pt. on medications and Pt. on home beta blockers Normal cardiovascular exam Rhythm:Regular Rate:Normal  ECG 05/10/21:  Sinus rhythm Paired ventricular premature complexes Aberrant conduction of SV complex(es) Minimal ST elevation, anterior leads Baseline wander in lead(s) III   Neuro/Psych CVA (pt reported, not in Epic; 2010, residual left hand numbness)    GI/Hepatic negative GI ROS,   Endo/Other  Obesity   Renal/GU negative Renal ROS     Musculoskeletal   Abdominal   Peds  Hematology negative hematology ROS (+)   Anesthesia Other Findings Left Subtrochanteric Hip Fracture  Reproductive/Obstetrics                          Anesthesia Physical Anesthesia Plan  ASA: 2  Anesthesia Plan: General and Spinal   Post-op Pain Management:    Induction: Intravenous  PONV Risk Score and Plan: 1 and Propofol infusion, TIVA and Treatment may vary due to age or medical condition  Airway Management Planned: Natural Airway  Additional Equipment:   Intra-op Plan:   Post-operative Plan:   Informed Consent: I have reviewed the patients History and Physical, chart, labs and discussed the procedure including the risks, benefits and alternatives for the proposed anesthesia with the patient or authorized representative who has indicated his/her understanding  and acceptance.       Plan Discussed with: CRNA  Anesthesia Plan Comments: (Plan for spinal and GA with natural airway, GETA backup. Plt 340 on 05/11/21.)       Anesthesia Quick Evaluation

## 2021-05-11 NOTE — ED Notes (Signed)
Informed RN bed assigned 

## 2021-05-11 NOTE — Transfer of Care (Signed)
Immediate Anesthesia Transfer of Care Note  Patient: Jonathon Anderson  Procedure(s) Performed: INTRAMEDULLARY (IM) NAIL INTERTROCHANTRIC (Left)  Patient Location: PACU  Anesthesia Type:Spinal  Level of Consciousness: awake  Airway & Oxygen Therapy: Patient Spontanous Breathing  Post-op Assessment: Report given to RN  Post vital signs: stable  Last Vitals:  Vitals Value Taken Time  BP    Temp    Pulse    Resp 14 05/11/21 1537  SpO2    Vitals shown include unvalidated device data.  Last Pain:  Vitals:   05/11/21 1221  TempSrc: Oral  PainSc: 2          Complications: No notable events documented.

## 2021-05-12 ENCOUNTER — Encounter: Payer: Self-pay | Admitting: Surgery

## 2021-05-12 LAB — CBC
HCT: 29.9 % — ABNORMAL LOW (ref 39.0–52.0)
Hemoglobin: 10.5 g/dL — ABNORMAL LOW (ref 13.0–17.0)
MCH: 33.2 pg (ref 26.0–34.0)
MCHC: 35.1 g/dL (ref 30.0–36.0)
MCV: 94.6 fL (ref 80.0–100.0)
Platelets: 328 K/uL (ref 150–400)
RBC: 3.16 MIL/uL — ABNORMAL LOW (ref 4.22–5.81)
RDW: 12.5 % (ref 11.5–15.5)
WBC: 14.5 K/uL — ABNORMAL HIGH (ref 4.0–10.5)
nRBC: 0 % (ref 0.0–0.2)

## 2021-05-12 LAB — BASIC METABOLIC PANEL
Anion gap: 6 (ref 5–15)
BUN: 13 mg/dL (ref 8–23)
CO2: 28 mmol/L (ref 22–32)
Calcium: 8.3 mg/dL — ABNORMAL LOW (ref 8.9–10.3)
Chloride: 97 mmol/L — ABNORMAL LOW (ref 98–111)
Creatinine, Ser: 0.99 mg/dL (ref 0.61–1.24)
GFR, Estimated: >60 mL/min (ref >60)
Glucose, Bld: 150 mg/dL — ABNORMAL HIGH (ref 70–99)
Potassium: 3.8 mmol/L (ref 3.5–5.1)
Sodium: 131 mmol/L — ABNORMAL LOW (ref 135–145)

## 2021-05-12 LAB — IRON AND TIBC
Iron: 28 ug/dL — ABNORMAL LOW (ref 45–182)
Saturation Ratios: 12 % — ABNORMAL LOW (ref 17.9–39.5)
TIBC: 239 ug/dL — ABNORMAL LOW (ref 250–450)
UIBC: 211 ug/dL

## 2021-05-12 LAB — RETICULOCYTES
Immature Retic Fract: 22.3 % — ABNORMAL HIGH (ref 2.3–15.9)
RBC.: 3.14 MIL/uL — ABNORMAL LOW (ref 4.22–5.81)
Retic Count, Absolute: 79.8 10*3/uL (ref 19.0–186.0)
Retic Ct Pct: 2.5 % (ref 0.4–3.1)

## 2021-05-12 LAB — GLUCOSE, CAPILLARY: Glucose-Capillary: 144 mg/dL — ABNORMAL HIGH (ref 70–99)

## 2021-05-12 LAB — FOLATE: Folate: 4.2 ng/mL — ABNORMAL LOW (ref >5.9)

## 2021-05-12 LAB — FERRITIN: Ferritin: 86 ng/mL (ref 24–336)

## 2021-05-12 LAB — VITAMIN B12: Vitamin B-12: 224 pg/mL (ref 180–914)

## 2021-05-12 MED ORDER — FE FUMARATE-B12-VIT C-FA-IFC PO CAPS
1.0000 | ORAL_CAPSULE | Freq: Two times a day (BID) | ORAL | Status: DC
  Administered 2021-05-12 – 2021-05-13 (×2): 1 via ORAL
  Filled 2021-05-12 (×5): qty 1

## 2021-05-12 NOTE — Progress Notes (Signed)
PROGRESS NOTE    STAVROS CAIL  YIF:[id number] DOB: 1956-03-13 DOA: 05/10/2021 PCP: Pcp, No   Brief Narrative: Taken from H&P. XYON LUKASIK is a 65 y.o. male with medical history significant for hypertension, hyperlipidemia, smoker, who presents to the emergency department on 05/10/2021 with left hip pain after a fall at home. He slipped on the floor at home and landed on his left hip on the stairs. Found to have an acute mildly displaced left subtrochanteric fracture.  Orthopedic was consulted and he was taken to the OR s/p ORIF.  Tolerated the procedure well. PT is recommending SNF.  TOC started the work-up  Subjective: Patient was seen and examined today.  Seems improving, pain well controlled under current regimen.  Eating his breakfast.  Assessment & Plan:   Principal Problem:   Closed subtrochanteric fracture of hip, left, initial encounter [facility]) Active Problems:   Primary hypertension   Mixed hyperlipidemia   Cigarette nicotine dependence, uncomplicated   Hypokalemia  Left subtrochanteric fracture secondary to mechanical fall.  S/P ORIF with orthopedic on 05/01/2021. -PT is recommending SNF-TOC started the work-up. -Continue with pain management.  Acute blood loss anemia.  Hemoglobin dropped to 10.5 from 12.8, although blood loss mentioned is 125 cc in the operating note.  -Check anemia panel-shows mildly low folate and iron, B12 pending. -Start him on supplement.  Hypertension.  Blood pressure within goal. -Continue with home metoprolol. -Can resume home dose of triamterene HCTZ if needed  Hypokalemia.  Resolved.  Magnesium within normal limit.  Nicotine dependence. -Nicotine patch  Dyslipidemia. -Continue with home dose of atorvastatin  Objective: Vitals:   05/11/21 2044 05/12/21 0011 05/12/21 0532 05/12/21 0733  BP:  116/64 120/68 117/70  Pulse:  98 (!) 109 (!) 110  Resp:    16  Temp:  98.8 F (37.1 C) 98.2 F (36.8 C) 97.6 F (36.4 C)   TempSrc:  Oral Oral Oral  SpO2: 95% 97% 96% 96%  Weight:      Height:        Intake/Output Summary (Last 24 hours) at 05/12/2021 0905 Last data filed at 05/12/2021 0600 Gross per 24 hour  Intake 2490 ml  Output 1125 ml  Net 1365 ml    Filed Weights   05/10/21 1530  Weight: 97.5 kg    Examination:  General.  Well-developed gentleman, in no acute distress. Pulmonary.  Lungs clear bilaterally, normal respiratory effort. CV.  Regular rate and rhythm, no JVD, rub or murmur. Abdomen.  Soft, nontender, nondistended, BS positive. CNS.  Alert and oriented x3.  No focal neurologic deficit. Extremities.  No edema, no cyanosis, pulses intact and symmetrical. Psychiatry.  Judgment and insight appears normal.    DVT prophylaxis: Lovenox  Code Status: Full Family Communication: Discussed with patient Disposition Plan:  Status is: Inpatient  Remains inpatient appropriate because: Of severity of illness.   Level of care: Med-Surg  All the records are reviewed and case discussed with Care Management/Social Worker. Management plans discussed with the patient, nursing and they are in agreement.  Consultants:  Orthopedic surgery  Procedures:  Antimicrobials:   Data Reviewed: I have personally reviewed following labs and imaging studies  CBC: Recent Labs  Lab 05/10/21 1552 05/11/21 0620 05/12/21 0412  WBC 7.4 9.0 14.5*  NEUTROABS 5.2  --   --   HGB 12.8* 13.3 10.5*  HCT 37.1* 39.7 29.9*  MCV 94.6 94.1 94.6  PLT 314 340 328    Basic Metabolic Panel: Recent Labs  Lab  05/10/21 1552 05/11/21 0620 05/12/21 0412  NA 132* 134* 131*  K 3.0* 3.8 3.8  CL 95* 96* 97*  CO2 31 27 28   GLUCOSE 136* 108* 150*  BUN 8 7* 13  CREATININE 0.78 0.67 0.99  CALCIUM 8.6* 8.9 8.3*  MG 1.9  --   --     GFR: Estimated Creatinine Clearance: 81.9 mL/min (by C-G formula based on SCr of 0.99 mg/dL). Liver Function Tests: Recent Labs  Lab 05/10/21 1552  AST 16  ALT 12  ALKPHOS  86  BILITOT 0.6  PROT 7.4  ALBUMIN 3.7    No results for input(s): LIPASE, AMYLASE in the last 168 hours. No results for input(s): AMMONIA in the last 168 hours. Coagulation Profile: Recent Labs  Lab 05/11/21 0620  INR 1.1    Cardiac Enzymes: No results for input(s): CKTOTAL, CKMB, CKMBINDEX, TROPONINI in the last 168 hours. BNP (last 3 results) No results for input(s): PROBNP in the last 8760 hours. HbA1C: No results for input(s): HGBA1C in the last 72 hours. CBG: No results for input(s): GLUCAP in the last 168 hours. Lipid Profile: No results for input(s): CHOL, HDL, LDLCALC, TRIG, CHOLHDL, LDLDIRECT in the last 72 hours. Thyroid Function Tests: No results for input(s): TSH, T4TOTAL, FREET4, T3FREE, THYROIDAB in the last 72 hours. Anemia Panel: No results for input(s): VITAMINB12, FOLATE, FERRITIN, TIBC, IRON, RETICCTPCT in the last 72 hours. Sepsis Labs: No results for input(s): PROCALCITON, LATICACIDVEN in the last 168 hours.  Recent Results (from the past 240 hour(s))  Resp Panel by RT-PCR (Flu A&B, Covid) Nasopharyngeal Swab     Status: None   Collection Time: 05/10/21  3:45 PM   Specimen: Nasopharyngeal Swab; Nasopharyngeal(NP) swabs in vial transport medium  Result Value Ref Range Status   SARS Coronavirus 2 by RT PCR NEGATIVE NEGATIVE Final    Comment: (NOTE) SARS-CoV-2 target nucleic acids are NOT DETECTED.  The SARS-CoV-2 RNA is generally detectable in upper respiratory specimens during the acute phase of infection. The lowest concentration of SARS-CoV-2 viral copies this assay can detect is 138 copies/mL. A negative result does not preclude SARS-Cov-2 infection and should not be used as the sole basis for treatment or other patient management decisions. A negative result may occur with  improper specimen collection/handling, submission of specimen other than nasopharyngeal swab, presence of viral mutation(s) within the areas targeted by this assay, and  inadequate number of viral copies(<138 copies/mL). A negative result must be combined with clinical observations, patient history, and epidemiological information. The expected result is Negative.  Fact Sheet for Patients:  05/12/21  Fact Sheet for Healthcare Providers:  BloggerCourse.com  This test is no t yet approved or cleared by the [employer] FDA and  has been authorized for detection and/or diagnosis of SARS-CoV-2 by FDA under an Emergency Use Authorization (EUA). This EUA will remain  in effect (meaning this test can be used) for the duration of the COVID-19 declaration under Section 564(b)(1) of the Act, 21 U.S.C.section 360bbb-3(b)(1), unless the authorization is terminated  or revoked sooner.       Influenza A by PCR NEGATIVE NEGATIVE Final   Influenza B by PCR NEGATIVE NEGATIVE Final    Comment: (NOTE) The Xpert Xpress SARS-CoV-2/FLU/RSV plus assay is intended as an aid in the diagnosis of influenza from Nasopharyngeal swab specimens and should not be used as a sole basis for treatment. Nasal washings and aspirates are unacceptable for Xpert Xpress SARS-CoV-2/FLU/RSV testing.  Fact Sheet for Patients: [place]  Fact  Sheet for Healthcare Providers: SeriousBroker.it  This test is not yet approved or cleared by the [place] and has been authorized for detection and/or diagnosis of SARS-CoV-2 by FDA under an Emergency Use Authorization (EUA). This EUA will remain in effect (meaning this test can be used) for the duration of the COVID-19 declaration under Section 564(b)(1) of the Act, 21 U.S.C. section 360bbb-3(b)(1), unless the authorization is terminated or revoked.  Performed at [facility], [address] [place], [place] [address]   Surgical pcr screen     Status: Abnormal   Collection Time: 05/11/21 10:03 AM    Specimen: Nasal Mucosa; Nasal Swab  Result Value Ref Range Status   MRSA, PCR NEGATIVE NEGATIVE Final   Staphylococcus aureus POSITIVE (A) NEGATIVE Final    Comment: (NOTE) The Xpert SA Assay (FDA approved for NASAL specimens in patients 40 years of age and older), is one component of a comprehensive surveillance program. It is not intended to diagnose infection nor to guide or monitor treatment. Performed at [facility], [address] [place], [place] [address]       Radiology Studies: DG Chest Portable 1 View  Result Date: 05/10/2021 CLINICAL DATA:  Fall EXAM: PORTABLE CHEST 1 VIEW COMPARISON:  None. FINDINGS: The heart size and mediastinal contours are within normal limits. Both lungs are clear. The visualized skeletal structures are unremarkable. IMPRESSION: No active disease. Electronically Signed   By: Jasmine Pang M.D.   On: 05/10/2021 16:53   DG C-Arm 1-60 Min  Result Date: 05/11/2021 CLINICAL DATA:  Surgery EXAM: DG C-ARM 1-60 MIN FLUOROSCOPY TIME:  Fluoroscopy Time:  1 minute 12 seconds Radiation Exposure Index (if provided by the fluoroscopic device): 22.2 mGy Number of Acquired Spot Images: 7 images COMPARISON:  Radiograph 05/11/2021 FINDINGS: Intraoperative images were obtained during left intertrochanteric femur fracture fixation with intramedullary nailing. IMPRESSION: Intraoperative images were obtained during left intertrochanteric femur fracture fixation with intramedullary nailing. Electronically Signed   By: Caprice Renshaw M.D.   On: 05/11/2021 17:50   DG C-Arm 1-60 Min  Result Date: 05/11/2021 CLINICAL DATA:  Surgery EXAM: DG C-ARM 1-60 MIN FLUOROSCOPY TIME:  Fluoroscopy Time:  1 minute 12 seconds Radiation Exposure Index (if provided by the fluoroscopic device): 22.2 mGy Number of Acquired Spot Images: 7 images COMPARISON:  Radiograph 05/10/2021 FINDINGS: Intraoperative images were obtained during intertrochanteric fracture fixation with intramedullary  nailing. IMPRESSION: Intraoperative images were obtained during intertrochanteric fracture fixation with intramedullary nailing. Electronically Signed   By: Caprice Renshaw M.D.   On: 05/11/2021 17:49   DG Hip Unilat With Pelvis 2-3 Views Left  Result Date: 05/10/2021 CLINICAL DATA:  Fall with hip pain EXAM: DG HIP (WITH OR WITHOUT PELVIS) 2-3V LEFT COMPARISON:  None. FINDINGS: There is an acute subtrochanteric fracture on the left with approximately 1.0 cm medial displacement, 1.0 cm anterior displacement, and slight posterior angulation of the dominant distal fragment. Femoroacetabular alignment is maintained. There is no fracture on the right. The SI joints and symphysis pubis are intact. IMPRESSION: Acute mildly displaced left subtrochanteric fracture. Electronically Signed   By: Lesia Hausen M.D.   On: 05/10/2021 16:53   DG FEMUR MIN 2 VIEWS LEFT  Result Date: 05/11/2021 CLINICAL DATA:  Status post left femur intramedullary nail EXAM: LEFT FEMUR 2 VIEWS COMPARISON:  None. FINDINGS: Postsurgical changes of left femur intramedullary nailing for an intertrochanteric fracture. Improved alignment. Expected soft tissue changes. IMPRESSION: Postsurgical changes of left femur intramedullary nailing for an intertrochanteric fracture.  Improved alignment. Expected soft tissue changes. Electronically Signed   By: Caprice Renshaw M.D.   On: 05/11/2021 17:51   DG FEMUR MIN 2 VIEWS LEFT  Result Date: 05/11/2021 CLINICAL DATA:  Left femur surgery EXAM: LEFT FEMUR 2 VIEWS COMPARISON:  Hip radiograph 05/10/2021 FINDINGS: Intraoperative images during left femur intramedullary narrowing and intertrochanteric fracture fixation. Intact hardware without evidence of immediate complication. IMPRESSION: Intraoperative images during intertrochanteric fracture fixation with intramedullary nailing. No evidence of immediate complication. Electronically Signed   By: Caprice Renshaw M.D.   On: 05/11/2021 17:48    Scheduled Meds:   acetaminophen  500 mg Oral Q6H   atorvastatin  20 mg Oral QHS   Chlorhexidine Gluconate Cloth  6 each Topical Daily   docusate sodium  100 mg Oral BID   enoxaparin (LOVENOX) injection  40 mg Subcutaneous Q24H   ketorolac  7.5 mg Intravenous Q6H   magnesium oxide  400 mg Oral Daily   metoprolol tartrate  100 mg Oral BID   mupirocin ointment  1 application Nasal BID   thiamine  100 mg Oral Daily   Continuous Infusions:  0.9 % NaCl with KCl 20 mEq / L 75 mL/hr at 05/11/21 1928    ceFAZolin (ANCEF) IV 2 g (05/12/21 0135)     LOS: 2 days   Time spent: 35 minutes More than 50% of the time was spent in counseling/coordination of care  Arnetha Courser, MD [facility]  If 7PM-7AM, please contact night-coverage [email or web address]  05/12/2021, 9:05 AM   This record has been created using Conservation officer, historic buildings. Errors have been sought and corrected,but may not always be located. Such creation errors do not reflect on the standard of care.

## 2021-05-12 NOTE — Evaluation (Signed)
Physical Therapy Evaluation Patient Details Name: Jonathon Anderson MRN: [id number] DOB: 06/26/1956 Today's Date: 05/12/2021  History of Present Illness  Pt is a 65 y.o. male with medical history significant for hypertension, hyperlipidemia, smoker, who presents to the emergency department on 05/10/2021 with left hip pain after a fall at home.  Pt diagnosed with displaced subtrochanteric fracture of the L hip and is s/p reduction and internal fixation of displaced left subtrochanteric hip fracture with [employer] TFN nail.   Clinical Impression  Pt was pleasant and motivated to participate during the session. Pt put forth very good effort during the session but despite that required extensive physical assistance with bed mobility and transfers and was only able to take several effortful steps at the EOB before fatiguing and needing to return to sitting.  Pt was able to maintain proper sequencing for PWB compliance during ambulation attempts with heavy multi-modal cuing and reported no increase in L hip pain with gait.  Pt is at a high risk for falls as well as NWB compliance if he were to return to his prior living situation at discharge from acute care. Pt will benefit from PT services in a SNF setting upon discharge to safely address deficits listed in patient problem list for decreased caregiver assistance and eventual return to PLOF.        Recommendations for follow up therapy are one component of a multi-disciplinary discharge planning process, led by the attending physician.  Recommendations may be updated based on patient status, additional functional criteria and insurance authorization.  Follow Up Recommendations SNF;Supervision/Assistance - 24 hour    Equipment Recommendations  Rolling walker with 5" wheels    Recommendations for Other Services       Precautions / Restrictions Precautions Precautions: Fall Restrictions Weight Bearing Restrictions: Yes LLE Weight Bearing:  Partial weight bearing      Mobility  Bed Mobility Overal bed mobility: Needs Assistance Bed Mobility: Supine to Sit;Sit to Supine     Supine to sit: Min assist Sit to supine: Mod assist   General bed mobility comments: Min to mod A for trunk and LLE control with signficant extra time and effort needed    Transfers Overall transfer level: Needs assistance Equipment used: Rolling walker (2 wheeled) Transfers: Sit to/from Stand Sit to Stand: From elevated surface;Mod assist         General transfer comment: Mod to max cuing for sequencing with Mod A and multiple rocking attempts needed to come to standing; pt with difficulty coming to full upright standing  Ambulation/Gait Ambulation/Gait assistance: Min assist;Mod assist Gait Distance (Feet): 1 Feet x 2 Assistive device: Rolling walker (2 wheeled) Gait Pattern/deviations: Step-to pattern;Trunk flexed;Decreased stance time - left;Antalgic Gait velocity: decreased   General Gait Details: Max verbal and visual cues needed for sequencing for PWB compliance as well as min-mod A for stability and to advance the RW  Stairs            Wheelchair Mobility    Modified Rankin (Stroke Patients Only)       Balance Overall balance assessment: Needs assistance;History of Falls   Sitting balance-Leahy Scale: Good     Standing balance support: Bilateral upper extremity supported;During functional activity Standing balance-Leahy Scale: Poor                               Pertinent Vitals/Pain Pain Assessment: 0-10 Pain Score: 5  Pain Location: L hip Pain  Descriptors / Indicators: Aching;Sore Pain Intervention(s): Premedicated before session;Repositioned;Ice applied;Monitored during session    Home Living Family/patient expects to be discharged to:: Private residence Living Arrangements: Other relatives Available Help at Discharge: Family;Available 24 hours/day Type of Home: Mobile home Home Access:  Stairs to enter Entrance Stairs-Rails: None Entrance Stairs-Number of Steps: 3 Home Layout: One level Home Equipment: Cane - single point Additional Comments: Pt lives with younger sister who is also retired, Building control surveyor available    Prior Function Level of Independence: Independent         Comments: Ind amb without an AD community distances, current fall secondary to slipping on wet concrete, no other fall history, Ind with ADLs     Hand Dominance        Extremity/Trunk Assessment   Upper Extremity Assessment Upper Extremity Assessment: Generalized weakness    Lower Extremity Assessment Lower Extremity Assessment: Generalized weakness;LLE deficits/detail LLE: Unable to fully assess due to pain       Communication   Communication: No difficulties  Cognition Arousal/Alertness: Awake/alert Behavior During Therapy: WFL for tasks assessed/performed Overall Cognitive Status: Within Functional Limits for tasks assessed                                        General Comments      Exercises Total Joint Exercises Ankle Circles/Pumps: AROM;Strengthening;Both;10 reps Quad Sets: Strengthening;Both;10 reps Gluteal Sets: 10 reps;Both;Strengthening Hip ABduction/ADduction: AAROM;Strengthening;Left;10 reps Straight Leg Raises: 10 reps;Left;Strengthening;AAROM Long Arc Quad: AROM;Strengthening;Both;10 reps Knee Flexion: 10 reps;Both;Strengthening;AROM Other Exercises Other Exercises: Smoking cessation education provided with emphasis on relationship between smoking and wound heeling Other Exercises: HEP education for BLE APs, QS, GS, and LAQs x 10 each 3-4x/day   Assessment/Plan    PT Assessment Patient needs continued PT services  PT Problem List Decreased strength;Decreased activity tolerance;Decreased balance;Decreased mobility;Decreased knowledge of use of DME;Pain;Decreased knowledge of precautions       PT Treatment Interventions DME  instruction;Gait training;Stair training;Functional mobility training;Therapeutic activities;Therapeutic exercise;Balance training;Patient/family education    PT Goals (Current goals can be found in the Care Plan section)  Acute Rehab PT Goals Patient Stated Goal: Get back to walking PT Goal Formulation: With patient Time For Goal Achievement: 05/25/21 Potential to Achieve Goals: Good    Frequency BID   Barriers to discharge Inaccessible home environment;Decreased caregiver support      Co-evaluation               AM-PAC PT "6 Clicks" Mobility  Outcome Measure Help needed turning from your back to your side while in a flat bed without using bedrails?: A Little Help needed moving from lying on your back to sitting on the side of a flat bed without using bedrails?: A Lot Help needed moving to and from a bed to a chair (including a wheelchair)?: A Lot Help needed standing up from a chair using your arms (e.g., wheelchair or bedside chair)?: A Lot Help needed to walk in hospital room?: Total Help needed climbing 3-5 steps with a railing? : Total 6 Click Score: 11    End of Session Equipment Utilized During Treatment: Gait belt Activity Tolerance: Patient tolerated treatment well Patient left: in bed;with call bell/phone within reach;with bed alarm set;with nursing/sitter in room;with SCD's reapplied Nurse Communication: Mobility status;Weight bearing status PT Visit Diagnosis: Unsteadiness on feet (R26.81);History of falling (Z91.81);Other abnormalities of gait and mobility (R26.89);Muscle weakness (generalized) (M62.81);Pain Pain -  Right/Left: Left Pain - part of body: Hip    Time: 6415-8309 PT Time Calculation (min) (ACUTE ONLY): 43 min   Charges:   PT Evaluation $PT Eval Moderate Complexity: 1 Mod PT Treatments $Therapeutic Exercise: 8-22 mins $Therapeutic Activity: 8-22 mins        D. Scott Fong Mccarry PT, DPT 05/12/21, 9:52 AM

## 2021-05-12 NOTE — Progress Notes (Signed)
Physical Therapy Treatment Patient Details Name: Jonathon Anderson MRN: [id number] DOB: 1956-01-01 Today's Date: 05/12/2021   History of Present Illness Pt is a 65 y.o. male with medical history significant for hypertension, hyperlipidemia, smoker, who presents to the emergency department on 05/10/2021 with left hip pain after a fall at home.  Pt diagnosed with displaced subtrochanteric fracture of the L hip and is s/p reduction and internal fixation of displaced left subtrochanteric hip fracture with [employer] TFN nail.    PT Comments    Pt was pleasant and motivated to participate during the session and put forth very good effort throughout. Pt required decreased physical assistance with bed mobility tasks and transfers and was able to amb 2 feet at the EOB with good WB compliance.  Pt reported no adverse symptoms during the session other than mild to moderate L hip pain with SpO2 and HR WNL.  Pt continued to present with functional weakness and poor activity tolerance with ambulation and would not be safe to return to his prior living situation at this time.  Pt will benefit from PT services in a SNF setting upon discharge to safely address deficits listed in patient problem list for decreased caregiver assistance and eventual return to PLOF.     Recommendations for follow up therapy are one component of a multi-disciplinary discharge planning process, led by the attending physician.  Recommendations may be updated based on patient status, additional functional criteria and insurance authorization.  Follow Up Recommendations  SNF;Supervision/Assistance - 24 hour     Equipment Recommendations  Rolling walker with 5" wheels    Recommendations for Other Services       Precautions / Restrictions Precautions Precautions: Fall Restrictions Weight Bearing Restrictions: Yes LLE Weight Bearing: Partial weight bearing     Mobility  Bed Mobility Overal bed mobility: Needs  Assistance Bed Mobility: Supine to Sit;Sit to Supine     Supine to sit: Supervision Sit to supine: Supervision   General bed mobility comments: Significantly increased time and effort and use of bed rails but no physical assist needed    Transfers Overall transfer level: Needs assistance Equipment used: Rolling walker (2 wheeled) Transfers: Sit to/from Stand Sit to Stand: From elevated surface;Min assist         General transfer comment: Mod cuing for sequencing with Min A and multiple rocking attempts needed to come to standing; pt with difficulty coming to full upright standing  Ambulation/Gait Ambulation/Gait assistance: Min assist Gait Distance (Feet): 2 Feet Assistive device: Rolling walker (2 wheeled) Gait Pattern/deviations: Step-to pattern;Trunk flexed;Decreased stance time - left;Antalgic Gait velocity: decreased   General Gait Details: Mod verbal and visual cues needed for sequencing for PWB compliance as well as min A for stability and to advance the RW   Stairs             Wheelchair Mobility    Modified Rankin (Stroke Patients Only)       Balance Overall balance assessment: Needs assistance;History of Falls   Sitting balance-Leahy Scale: Good     Standing balance support: Bilateral upper extremity supported;During functional activity Standing balance-Leahy Scale: Poor                              Cognition Arousal/Alertness: Awake/alert Behavior During Therapy: WFL for tasks assessed/performed Overall Cognitive Status: Within Functional Limits for tasks assessed  Exercises Total Joint Exercises Ankle Circles/Pumps: AROM;Strengthening;Both;10 reps Hip ABduction/ADduction: AAROM;Strengthening;Left;10 reps Straight Leg Raises: 10 reps;Left;Strengthening;AAROM Long Arc Quad: AROM;Strengthening;Both;10 reps Knee Flexion: 10 reps;Both;Strengthening;AROM    General Comments         Pertinent Vitals/Pain Pain Assessment: 0-10 Pain Score: 4  Pain Location: L hip Pain Descriptors / Indicators: Aching;Sore Pain Intervention(s): Premedicated before session;Monitored during session;Repositioned;Ice applied    Home Living                      Prior Function            PT Goals (current goals can now be found in the care plan section) Progress towards PT goals: Progressing toward goals    Frequency    BID      PT Plan Current plan remains appropriate    Co-evaluation              AM-PAC PT "6 Clicks" Mobility   Outcome Measure  Help needed turning from your back to your side while in a flat bed without using bedrails?: A Little Help needed moving from lying on your back to sitting on the side of a flat bed without using bedrails?: A Little Help needed moving to and from a bed to a chair (including a wheelchair)?: A Little Help needed standing up from a chair using your arms (e.g., wheelchair or bedside chair)?: A Little Help needed to walk in hospital room?: Total Help needed climbing 3-5 steps with a railing? : Total 6 Click Score: 14    End of Session Equipment Utilized During Treatment: Gait belt Activity Tolerance: Patient tolerated treatment well Patient left: in bed;with call bell/phone within reach;with bed alarm set;with SCD's reapplied Nurse Communication: Mobility status;Weight bearing status PT Visit Diagnosis: Unsteadiness on feet (R26.81);History of falling (Z91.81);Other abnormalities of gait and mobility (R26.89);Muscle weakness (generalized) (M62.81);Pain Pain - Right/Left: Left Pain - part of body: Hip     Time: 1350-1415 PT Time Calculation (min) (ACUTE ONLY): 25 min  Charges:  $Gait Training: 8-22 mins $Therapeutic Exercise: 8-22 mins                     D. Scott Deshawnda Acrey PT, DPT 05/12/21, 2:25 PM

## 2021-05-12 NOTE — Anesthesia Postprocedure Evaluation (Signed)
Anesthesia Post Note  Patient: TREVONTE ASHKAR  Procedure(s) Performed: INTRAMEDULLARY (IM) NAIL INTERTROCHANTRIC OF LEFT SUBTHROCHANTRIC HIP FRACTURE (Left: Leg Upper)  Patient location during evaluation: PACU Anesthesia Type: Spinal Level of consciousness: awake and alert, awake and oriented Pain management: pain level controlled Vital Signs Assessment: post-procedure vital signs reviewed and stable Respiratory status: spontaneous breathing, nonlabored ventilation and respiratory function stable Cardiovascular status: blood pressure returned to baseline and stable Postop Assessment: no apparent nausea or vomiting Anesthetic complications: no   No notable events documented.   Last Vitals:  Vitals:   05/12/21 0011 05/12/21 0532  BP: 116/64 120/68  Pulse: 98 (!) 109  Resp:    Temp: 37.1 C 36.8 C  SpO2: 97% 96%    Last Pain:  Vitals:   05/12/21 0557  TempSrc:   PainSc: 0-No pain                 Manfred Arch

## 2021-05-12 NOTE — Progress Notes (Signed)
   Subjective: 1 Day Post-Op Procedure(s) (LRB): INTRAMEDULLARY (IM) NAIL INTERTROCHANTRIC OF LEFT SUBTHROCHANTRIC HIP FRACTURE (Left) Patient reports pain as mild.   Patient is well, and has had no acute complaints or problems Denies any CP, SOB, ABD pain. We will continue therapy today.  Plan is to go Skilled nursing facility after hospital stay.  Objective: Vital signs in last 24 hours: Temp:  [97 F (36.1 C)-99 F (37.2 C)] 97.6 F (36.4 C) (10/20 0733) Pulse Rate:  [76-110] 110 (10/20 0733) Resp:  [14-19] 16 (10/20 0733) BP: (87-138)/(47-94) 117/70 (10/20 0733) SpO2:  [95 %-98 %] 96 % (10/20 0733)  Intake/Output from previous day: 10/19 0701 - 10/20 0700 In: 2490 [I.V.:1790; IV Piggyback:700] Out: 1125 [Urine:1000; Blood:125] Intake/Output this shift: No intake/output data recorded.  Recent Labs    05/10/21 1552 05/11/21 0620 05/12/21 0412  HGB 12.8* 13.3 10.5*   Recent Labs    05/11/21 0620 05/12/21 0412  WBC 9.0 14.5*  RBC 4.22 3.16*  HCT 39.7 29.9*  PLT 340 328   Recent Labs    05/11/21 0620 05/12/21 0412  NA 134* 131*  K 3.8 3.8  CL 96* 97*  CO2 27 28  BUN 7* 13  CREATININE 0.67 0.99  GLUCOSE 108* 150*  CALCIUM 8.9 8.3*   Recent Labs    05/11/21 0620  INR 1.1    EXAM General - Patient is Alert, Appropriate, and Oriented Extremity - Neurovascular intact Sensation intact distally Intact pulses distally Dorsiflexion/Plantar flexion intact Dressing - dressing C/D/I and no drainage Motor Function - intact, moving foot and toes well on exam.   Past Medical History:  Diagnosis Date   Hyperlipidemia    Hypertension     Assessment/Plan:   1 Day Post-Op Procedure(s) (LRB): INTRAMEDULLARY (IM) NAIL INTERTROCHANTRIC OF LEFT SUBTHROCHANTRIC HIP FRACTURE (Left) Principal Problem:   Closed subtrochanteric fracture of hip, left, initial encounter (HCC) Active Problems:   Primary hypertension   Mixed hyperlipidemia   Cigarette nicotine  dependence, uncomplicated   Hypokalemia  Estimated body mass index is 32.69 kg/m as calculated from the following:   Height as of this encounter: 5\' 8"  (1.727 m).   Weight as of this encounter: 97.5 kg. Advance diet Up with therapy, PWB LLE Pain well controlled Labs are stable - Hgb 10.5, continue to monitor VSS - HR 110, continue with IV fluids. Continue to monitor Work on BM CM to assist with discharge to SNF    DVT Prophylaxis - Lovenox, Foot Pumps, and TED hose Partial Weight-Bearing  to left leg   T. , PA-C [facility] 05/12/2021, 9:30 AM

## 2021-05-12 NOTE — NC FL2 (Signed)
[place] MEDICAID FL2 LEVEL OF CARE SCREENING TOOL     IDENTIFICATION  Patient Name: Jonathon Anderson Birthdate: Jul 07, 1956 Sex: male Admission Date (Current Location): 05/10/2021  [facility] and [place] Number:  Chiropodist and Address:  [facility], [address] [place], [place] [address]      Provider Number: [id number]  Attending Physician Name and Address:  Arnetha Courser, MD  Relative Name and Phone Number:  Eber Jones Sister [phone]    Current Level of Care: Hospital Recommended Level of Care: Skilled Nursing Facility Prior Approval Number:    Date Approved/Denied:   PASRR Number:   [id number] Discharge Plan: SNF    Current Diagnoses: Patient Active Problem List   Diagnosis Date Noted   Closed subtrochanteric fracture of hip, left, initial encounter (HCC) 05/10/2021   Primary hypertension 05/10/2021   Mixed hyperlipidemia 05/10/2021   Cigarette nicotine dependence, uncomplicated 05/10/2021   Hypokalemia 05/10/2021    Orientation RESPIRATION BLADDER Height & Weight     Self, Time, Situation, Place  Normal Continent, External catheter Weight: 97.5 kg Height:  5\' 8"  (172.7 cm)  BEHAVIORAL SYMPTOMS/MOOD NEUROLOGICAL BOWEL NUTRITION STATUS      Continent Diet (regular)  AMBULATORY STATUS COMMUNICATION OF NEEDS Skin   Extensive Assist   Normal, Surgical wounds                       Personal Care Assistance Level of Assistance  Bathing, Feeding, Dressing Bathing Assistance: Limited assistance Feeding assistance: Independent Dressing Assistance: Limited assistance     Functional Limitations Info             SPECIAL CARE FACTORS FREQUENCY  PT (By licensed PT), OT (By licensed OT)     PT Frequency: 5 times per week OT Frequency: 5 times per week            Contractures Contractures Info: Not present    Additional Factors Info  Code Status, Allergies Code Status Info: full Allergies Info: NKDA            Current Medications (05/12/2021):  This is the current hospital active medication list Current Facility-Administered Medications  Medication Dose Route Frequency Provider Last Rate Last Admin   0.9 % NaCl with KCl 20 mEq/ L  infusion   Intravenous Continuous Poggi, 05/14/2021, MD 75 mL/hr at 05/11/21 1928 New Bag at 05/11/21 1928   acetaminophen (TYLENOL) tablet 325-650 mg  325-650 mg Oral Q6H PRN Poggi, 05/13/21, MD       acetaminophen (TYLENOL) tablet 500 mg  500 mg Oral Q6H Poggi, Excell Seltzer, MD   500 mg at 05/12/21 0555   albuterol (PROVENTIL) (2.5 MG/3ML) 0.083% nebulizer solution 2.5 mg  2.5 mg Nebulization Q4H PRN 05/14/21, MD       atorvastatin (LIPITOR) tablet 20 mg  20 mg Oral QHS Poggi, Arnetha Courser, MD   20 mg at 05/11/21 2134   bisacodyl (DULCOLAX) suppository 10 mg  10 mg Rectal Daily PRN Poggi, 2135, MD       Chlorhexidine Gluconate Cloth 2 % PADS 6 each  6 each Topical Daily Poggi, Excell Seltzer, MD       cyclobenzaprine (FLEXERIL) tablet 10 mg  10 mg Oral TID PRN Excell Seltzer, MD   10 mg at 05/11/21 2134   diphenhydrAMINE (BENADRYL) 12.5 MG/5ML elixir 12.5-25 mg  12.5-25 mg Oral Q4H PRN Poggi, 08-23-1984, MD       docusate sodium (COLACE)  capsule 100 mg  100 mg Oral BID Christena Flake, MD   100 mg at 05/11/21 2134   enoxaparin (LOVENOX) injection 40 mg  40 mg Subcutaneous Q24H Poggi, Excell Seltzer, MD   40 mg at 05/12/21 1016   ferrous fumarate-b12-vitamic C-folic acid (TRINSICON / FOLTRIN) capsule 1 capsule  1 capsule Oral BID PC Arnetha Courser, MD       HYDROcodone-acetaminophen (NORCO/VICODIN) 5-325 MG per tablet 1-2 tablet  1-2 tablet Oral Q4H PRN Poggi, Excell Seltzer, MD   2 tablet at 05/11/21 1802   HYDROcodone-acetaminophen (NORCO/VICODIN) 5-325 MG per tablet 1-2 tablet  1-2 tablet Oral Q4H PRN Poggi, Excell Seltzer, MD   2 tablet at 05/12/21 1015   ketorolac (TORADOL) 15 MG/ML injection 7.5 mg  7.5 mg Intravenous Q6H Poggi, Excell Seltzer, MD   7.5 mg at 05/12/21 0555   magnesium hydroxide (MILK OF MAGNESIA) suspension  30 mL  30 mL Oral Daily PRN Christena Flake, MD   30 mL at 05/11/21 1803   magnesium oxide (MAG-OX) tablet 400 mg  400 mg Oral Daily Poggi, Excell Seltzer, MD   400 mg at 05/12/21 1014   metoCLOPramide (REGLAN) tablet 5-10 mg  5-10 mg Oral Q8H PRN Poggi, Excell Seltzer, MD       Or   metoCLOPramide (REGLAN) injection 5-10 mg  5-10 mg Intravenous Q8H PRN Poggi, Excell Seltzer, MD       metoprolol tartrate (LOPRESSOR) injection 5 mg  5 mg Intravenous Q6H PRN Poggi, Excell Seltzer, MD       metoprolol tartrate (LOPRESSOR) tablet 100 mg  100 mg Oral BID Poggi, Excell Seltzer, MD   100 mg at 05/12/21 1014   morphine 2 MG/ML injection 2 mg  2 mg Intravenous Q2H PRN Poggi, Excell Seltzer, MD   2 mg at 05/11/21 0615   mupirocin ointment (BACTROBAN) 2 % 1 application  1 application Nasal BID Poggi, Excell Seltzer, MD   1 application at 05/11/21 2136   ondansetron (ZOFRAN) tablet 4 mg  4 mg Oral Q6H PRN Poggi, Excell Seltzer, MD   4 mg at 05/12/21 1015   Or   ondansetron (ZOFRAN) injection 4 mg  4 mg Intravenous Q6H PRN Poggi, Excell Seltzer, MD   4 mg at 05/11/21 1804   senna-docusate (Senokot-S) tablet 1 tablet  1 tablet Oral QHS PRN Poggi, Excell Seltzer, MD       sodium phosphate (FLEET) 7-19 GM/118ML enema 1 enema  1 enema Rectal Once PRN Poggi, Excell Seltzer, MD       thiamine tablet 100 mg  100 mg Oral Daily Poggi, Excell Seltzer, MD   100 mg at 05/12/21 1015   traMADol (ULTRAM) tablet 50 mg  50 mg Oral Q6H PRN Poggi, Excell Seltzer, MD         Discharge Medications: Please see discharge summary for a list of discharge medications.  Relevant Imaging Results:  Relevant Lab Results:   Additional Information SS# [id number]  Marlowe Sax, RN

## 2021-05-12 NOTE — TOC Progression Note (Signed)
Transition of Care [facility]) - Progression Note    Patient Details  Name: Jonathon Anderson MRN: [id number] Date of Birth: 03-Nov-1955  Transition of Care [facility]) CM/SW Contact  Marlowe Sax, RN Phone Number: 05/12/2021, 10:32 AM  Clinical Narrative:      Spoke with the patient at the bedside, he is agreeable to a SNF bedsearch for STR, he lives at home with his younger sister and plans to return home      Expected Discharge Plan and Services                                                 Social Determinants of Health (SDOH) Interventions    Readmission Risk Interventions No flowsheet data found.

## 2021-05-13 DIAGNOSIS — R5381 Other malaise: Secondary | ICD-10-CM | POA: Diagnosis not present

## 2021-05-13 DIAGNOSIS — M14852 Arthropathies in other specified diseases classified elsewhere, left hip: Secondary | ICD-10-CM | POA: Diagnosis not present

## 2021-05-13 DIAGNOSIS — M6281 Muscle weakness (generalized): Secondary | ICD-10-CM | POA: Diagnosis not present

## 2021-05-13 DIAGNOSIS — R531 Weakness: Secondary | ICD-10-CM | POA: Diagnosis not present

## 2021-05-13 DIAGNOSIS — E7849 Other hyperlipidemia: Secondary | ICD-10-CM | POA: Diagnosis not present

## 2021-05-13 DIAGNOSIS — S7222XD Displaced subtrochanteric fracture of left femur, subsequent encounter for closed fracture with routine healing: Secondary | ICD-10-CM | POA: Diagnosis not present

## 2021-05-13 DIAGNOSIS — F17210 Nicotine dependence, cigarettes, uncomplicated: Secondary | ICD-10-CM | POA: Diagnosis not present

## 2021-05-13 DIAGNOSIS — E569 Vitamin deficiency, unspecified: Secondary | ICD-10-CM | POA: Diagnosis not present

## 2021-05-13 DIAGNOSIS — R2681 Unsteadiness on feet: Secondary | ICD-10-CM | POA: Diagnosis not present

## 2021-05-13 DIAGNOSIS — E785 Hyperlipidemia, unspecified: Secondary | ICD-10-CM | POA: Diagnosis not present

## 2021-05-13 DIAGNOSIS — S7222XA Displaced subtrochanteric fracture of left femur, initial encounter for closed fracture: Secondary | ICD-10-CM | POA: Diagnosis not present

## 2021-05-13 DIAGNOSIS — I10 Essential (primary) hypertension: Secondary | ICD-10-CM | POA: Diagnosis not present

## 2021-05-13 DIAGNOSIS — U071 COVID-19: Secondary | ICD-10-CM | POA: Diagnosis not present

## 2021-05-13 DIAGNOSIS — D509 Iron deficiency anemia, unspecified: Secondary | ICD-10-CM | POA: Diagnosis not present

## 2021-05-13 DIAGNOSIS — Z4789 Encounter for other orthopedic aftercare: Secondary | ICD-10-CM | POA: Diagnosis not present

## 2021-05-13 DIAGNOSIS — E871 Hypo-osmolality and hyponatremia: Secondary | ICD-10-CM | POA: Diagnosis not present

## 2021-05-13 DIAGNOSIS — W19XXXA Unspecified fall, initial encounter: Secondary | ICD-10-CM | POA: Diagnosis not present

## 2021-05-13 DIAGNOSIS — F17200 Nicotine dependence, unspecified, uncomplicated: Secondary | ICD-10-CM | POA: Diagnosis not present

## 2021-05-13 DIAGNOSIS — D649 Anemia, unspecified: Secondary | ICD-10-CM | POA: Diagnosis not present

## 2021-05-13 DIAGNOSIS — M6259 Muscle wasting and atrophy, not elsewhere classified, multiple sites: Secondary | ICD-10-CM | POA: Diagnosis not present

## 2021-05-13 DIAGNOSIS — Z7401 Bed confinement status: Secondary | ICD-10-CM | POA: Diagnosis not present

## 2021-05-13 LAB — RESP PANEL BY RT-PCR (FLU A&B, COVID) ARPGX2
Influenza A by PCR: NEGATIVE
Influenza B by PCR: NEGATIVE
SARS Coronavirus 2 by RT PCR: NEGATIVE

## 2021-05-13 LAB — BASIC METABOLIC PANEL
Anion gap: 10 (ref 5–15)
BUN: 14 mg/dL (ref 8–23)
CO2: 28 mmol/L (ref 22–32)
Calcium: 8 mg/dL — ABNORMAL LOW (ref 8.9–10.3)
Chloride: 96 mmol/L — ABNORMAL LOW (ref 98–111)
Creatinine, Ser: 0.72 mg/dL (ref 0.61–1.24)
GFR, Estimated: >60 mL/min (ref >60)
Glucose, Bld: 119 mg/dL — ABNORMAL HIGH (ref 70–99)
Potassium: 4 mmol/L (ref 3.5–5.1)
Sodium: 134 mmol/L — ABNORMAL LOW (ref 135–145)

## 2021-05-13 LAB — CBC
HCT: 26.2 % — ABNORMAL LOW (ref 39.0–52.0)
Hemoglobin: 8.9 g/dL — ABNORMAL LOW (ref 13.0–17.0)
MCH: 31.8 pg (ref 26.0–34.0)
MCHC: 34 g/dL (ref 30.0–36.0)
MCV: 93.6 fL (ref 80.0–100.0)
Platelets: 267 10*3/uL (ref 150–400)
RBC: 2.8 MIL/uL — ABNORMAL LOW (ref 4.22–5.81)
RDW: 12.8 % (ref 11.5–15.5)
WBC: 10.6 10*3/uL — ABNORMAL HIGH (ref 4.0–10.5)
nRBC: 0 % (ref 0.0–0.2)

## 2021-05-13 MED ORDER — HYDROCODONE-ACETAMINOPHEN 5-325 MG PO TABS: 1.0000 | ORAL_TABLET | ORAL | 0 refills | Status: DC | PRN

## 2021-05-13 MED ORDER — THIAMINE HCL 100 MG PO TABS: 100.0000 mg | ORAL_TABLET | Freq: Every day | ORAL | Status: DC

## 2021-05-13 MED ORDER — FE FUMARATE-B12-VIT C-FA-IFC PO CAPS: 1.0000 | ORAL_CAPSULE | Freq: Three times a day (TID) | ORAL | Status: DC

## 2021-05-13 MED ORDER — ENOXAPARIN SODIUM 40 MG/0.4ML IJ SOSY: 40.0000 mg | PREFILLED_SYRINGE | INTRAMUSCULAR | 0 refills | Status: DC

## 2021-05-13 MED ORDER — SENNOSIDES-DOCUSATE SODIUM 8.6-50 MG PO TABS: 1.0000 | ORAL_TABLET | Freq: Every day | ORAL | Status: DC

## 2021-05-13 MED ORDER — MUPIROCIN 2 % EX OINT: 1.0000 "application " | TOPICAL_OINTMENT | Freq: Two times a day (BID) | CUTANEOUS | 0 refills | Status: AC

## 2021-05-13 MED ORDER — MAGNESIUM OXIDE -MG SUPPLEMENT 400 (240 MG) MG PO TABS: 400.0000 mg | ORAL_TABLET | Freq: Every day | ORAL | Status: DC

## 2021-05-13 MED ORDER — BISACODYL 10 MG RE SUPP: 10.0000 mg | Freq: Every day | RECTAL | 0 refills | Status: DC | PRN

## 2021-05-13 NOTE — Plan of Care (Signed)
  Problem: Education: Goal: Knowledge of General Education information will improve Description: Including pain rating scale, medication(s)/side effects and non-pharmacologic comfort measures Outcome: Adequate for Discharge   Problem: Health Behavior/Discharge Planning: Goal: Ability to manage health-related needs will improve Outcome: Adequate for Discharge   Problem: Clinical Measurements: Goal: Ability to maintain clinical measurements within normal limits will improve Outcome: Adequate for Discharge Goal: Will remain free from infection Outcome: Adequate for Discharge Goal: Respiratory complications will improve Outcome: Adequate for Discharge   Problem: Activity: Goal: Risk for activity intolerance will decrease Outcome: Adequate for Discharge   Problem: Nutrition: Goal: Adequate nutrition will be maintained Outcome: Adequate for Discharge   Problem: Coping: Goal: Level of anxiety will decrease Outcome: Adequate for Discharge   Problem: Elimination: Goal: Will not experience complications related to bowel motility Outcome: Adequate for Discharge Goal: Will not experience complications related to urinary retention Outcome: Adequate for Discharge   Problem: Pain Managment: Goal: General experience of comfort will improve Outcome: Adequate for Discharge   Problem: Safety: Goal: Ability to remain free from injury will improve Outcome: Adequate for Discharge   Problem: Skin Integrity: Goal: Risk for impaired skin integrity will decrease Outcome: Adequate for Discharge   Problem: Education: Goal: Knowledge of the prescribed therapeutic regimen will improve Outcome: Adequate for Discharge Goal: Understanding of discharge needs will improve Outcome: Adequate for Discharge   Problem: Activity: Goal: Ability to avoid complications of mobility impairment will improve Outcome: Adequate for Discharge Goal: Ability to tolerate increased activity will improve Outcome:  Adequate for Discharge   Problem: Clinical Measurements: Goal: Postoperative complications will be avoided or minimized Outcome: Adequate for Discharge   Problem: Pain Management: Goal: Pain level will decrease with appropriate interventions Outcome: Adequate for Discharge   Problem: Skin Integrity: Goal: Will show signs of wound healing Outcome: Adequate for Discharge

## 2021-05-13 NOTE — Progress Notes (Signed)
Physical Therapy Treatment Patient Details Name: Jonathon Anderson MRN: [id number] DOB: 03-12-1956 Today's Date: 05/13/2021   History of Present Illness Pt is a 65 y.o. male with medical history significant for hypertension, hyperlipidemia, smoker, who presents to the emergency department on 05/10/2021 with left hip pain after a fall at home.  Pt diagnosed with displaced subtrochanteric fracture of the L hip and is s/p reduction and internal fixation of displaced left subtrochanteric hip fracture with Bi[employer] Affixis TFN nail.    PT Comments    Pt received upright in bed agreeable to afternoon session. Family member present throughout. Pt reports improvement in pain.  Remains very pleasant and motivated throughout. Able to transfer to EOB with HOB elevated and without external support from PT. Pt still requires use of his UE's to assist in mobilizing LLE to EOB. With bed elevated, able to stand with good hand palcement to RW with minguard and amb maintaining PWB on LLE 8' to standard chair in room. With seated rest for 3 min, able to stand with minguard with Ue's on chair rails and amb additional 7' to EOB requesting additional seated rest. After another 2-3 min with safe support and supervision, pt SPT/ step to recliner. Education during transfer on upright posture with ambulation to reduce strain on pecs, shoulders, and pt's Ue's. Pt verbalizing understanding. Pt will benefit from STR to progress safe ambulation to safe household distances with LRAD and LE strength as pt is still limited in mobility by WB'ing precautions placing pt at increased risk of falls. Pt denies questions about HEP packet.    Recommendations for follow up therapy are one component of a multi-disciplinary discharge planning process, led by the attending physician.  Recommendations may be updated based on patient status, additional functional criteria and insurance authorization.  Follow Up Recommendations   SNF;Supervision/Assistance - 24 hour     Equipment Recommendations  None recommended by PT (tbd by next venue of care)    Recommendations for Other Services       Precautions / Restrictions Precautions Precautions: Fall Restrictions Weight Bearing Restrictions: Yes LLE Weight Bearing: Partial weight bearing     Mobility  Bed Mobility Overal bed mobility: Needs Assistance Bed Mobility: Supine to Sit     Supine to sit: Supervision;HOB elevated Sit to supine: Supervision;HOB elevated   General bed mobility comments: No MinA needed for LLE for afternoon session Patient Response: Cooperative  Transfers Overall transfer level: Needs assistance Equipment used: Rolling walker (2 wheeled) Transfers: Sit to/from [employer] Sit to Stand: Min guard;From elevated surface Stand pivot transfers: From elevated surface;Min guard       General transfer comment: Relies on elevated surface, but better, safe hand placement with UE's without cuing needed.  Ambulation/Gait Ambulation/Gait assistance: Min guard Gait Distance (Feet): 17 Feet (8+7+2) Assistive device: Rolling walker (2 wheeled) Gait Pattern/deviations: Step-to pattern;Trunk flexed;Decreased stance time - left;Antalgic Gait velocity: decreased   General Gait Details: Remains with trunk flexed, but progressed in tolerance for ambulation. Maintains precautions.   Stairs             Wheelchair Mobility    Modified Rankin (Stroke Patients Only)       Balance Overall balance assessment: Needs assistance;History of Falls Sitting-balance support: No upper extremity supported;Feet supported Sitting balance-Leahy Scale: Good     Standing balance support: Bilateral upper extremity supported;During functional activity Standing balance-Leahy Scale: Poor Standing balance comment: Trunk flexed, still relies on UE reliance on RW's.  Cognition Arousal/Alertness:  Awake/alert Behavior During Therapy: WFL for tasks assessed/performed Overall Cognitive Status: Within Functional Limits for tasks assessed                                        Exercises Total Joint Exercises Ankle Circles/Pumps: AROM;Strengthening;Both;10 reps Quad Sets: Strengthening;AROM;Left;10 reps Gluteal Sets: 10 reps;Both;Strengthening Short Arc Quad: AROM;Left;10 reps Heel Slides: AROM;Left;10 reps Hip ABduction/ADduction: AAROM;Strengthening;Left;10 reps Straight Leg Raises: 10 reps;Left;Strengthening;AAROM Long Arc Quad: AROM;Strengthening;Both;10 reps Other Exercises Other Exercises: Education on upright psoture with ambulation    General Comments        Pertinent Vitals/Pain Pain Assessment: Faces Pain Score: 7  Faces Pain Scale: Hurts a little bit Pain Location: L hip Pain Descriptors / Indicators: Aching;Sore Pain Intervention(s): Limited activity within patient's tolerance;Repositioned;Premedicated before session    Home Living                      Prior Function            PT Goals (current goals can now be found in the care plan section) Acute Rehab PT Goals Patient Stated Goal: Get back to walking PT Goal Formulation: With patient Time For Goal Achievement: 05/25/21 Potential to Achieve Goals: Good Progress towards PT goals: Progressing toward goals    Frequency    BID      PT Plan Current plan remains appropriate    Co-evaluation              AM-PAC PT "6 Clicks" Mobility   Outcome Measure  Help needed turning from your back to your side while in a flat bed without using bedrails?: A Little Help needed moving from lying on your back to sitting on the side of a flat bed without using bedrails?: A Little Help needed moving to and from a bed to a chair (including a wheelchair)?: A Little Help needed standing up from a chair using your arms (e.g., wheelchair or bedside chair)?: A Little Help needed to  walk in hospital room?: A Lot Help needed climbing 3-5 steps with a railing? : Total 6 Click Score: 15    End of Session Equipment Utilized During Treatment: Gait belt Activity Tolerance: Patient tolerated treatment well Patient left: in chair;with call bell/phone within reach;with chair alarm set;with family/visitor present Nurse Communication: Mobility status PT Visit Diagnosis: Unsteadiness on feet (R26.81);History of falling (Z91.81);Other abnormalities of gait and mobility (R26.89);Muscle weakness (generalized) (M62.81);Pain Pain - Right/Left: Left Pain - part of body: Hip     Time: 6160-7371 PT Time Calculation (min) (ACUTE ONLY): 30 min  Charges:  $Gait Training: 23-37 mins $Therapeutic Exercise: 23-37 mins $Therapeutic Activity: 8-22 mins                     Selma Rodelo M. Fairly IV, PT, DPT Physical Therapist- [facility]  05/13/2021, 2:04 PM

## 2021-05-13 NOTE — Progress Notes (Signed)
This nurse attempted to call report to the facility. I was kept on hold for 20 minutes while the facility tried to get any nurse available to take report, but was unsuccessful. I left a message with the unit supervisor for someone to call me back for report. Case manager is aware, she will also reach out to the facility.

## 2021-05-13 NOTE — TOC Progression Note (Signed)
Transition of Care [facility]) - Progression Note    Patient Details  Name: ASWAD WANDREY MRN: [id number] Date of Birth: 11-27-1955  [facility] of [facility]) CM/SW Contact  Marlowe Sax, RN Phone Number: 05/13/2021, 1:39 PM  Clinical Narrative:   Called EMS to go to [facility], The bedside nurse to call report to [facility], The patient will notify his family of the DC        Expected Discharge Plan and Services           Expected Discharge Date: 05/13/21                                     Social Determinants of Health (SDOH) Interventions    Readmission Risk Interventions No flowsheet data found.

## 2021-05-13 NOTE — Progress Notes (Signed)
Physical Therapy Treatment Patient Details Name: Jonathon Anderson MRN: [id number] DOB: 07-13-1956 Today's Date: 05/13/2021   History of Present Illness Pt is a 65 y.o. male with medical history significant for hypertension, hyperlipidemia, smoker, who presents to the emergency department on 05/10/2021 with left hip pain after a fall at home.  Pt diagnosed with displaced subtrochanteric fracture of the L hip and is s/p reduction and internal fixation of displaced left subtrochanteric hip fracture with [employer] TFN nail.    PT Comments    Pt received upright in bed with RN and RN students giving pt morning meds. Agreeable to PT session. Began with LE therex and given appropriate HEP handout for Hip fracture. Intermittent, multimodal cues required for exercises with good carryover after cuing. Requires minA for mobilizing LLE to EoB today due to L hip pain at 5/10 NPS. Good static sitting balance noted. With bed elevated, pt requires minA to stand to RW with VC's for safe hand placement. Able to stand following PWB orders on LLE, but does require significant UE reliance on RW. Difficulty ambulating past 4' needing to sit at foot of bed with pt reporting 7/10 pain with weightbearing and ambulating. SPT x2 from bed to recliner <> then back to bed. Supervision only needed going from chair to bed with VC's/education to push up with UE's on chair rails then needs minA to return to bed. MinA relied on for returning LLE back into bed as well. Pt still having difficulty mobilizing due to L hip pain and Wb'ing precautions only able to amb 4' with hunched over posture placing pt at increased risk of falls. Will benefit from STR to optimize strength and progress OOB mobility with LRAD prior to returning back to home environment.   Recommendations for follow up therapy are one component of a multi-disciplinary discharge planning process, led by the attending physician.  Recommendations may be updated based on  patient status, additional functional criteria and insurance authorization.  Follow Up Recommendations  SNF;Supervision/Assistance - 24 hour     Equipment Recommendations  Rolling walker with 5" wheels    Recommendations for Other Services       Precautions / Restrictions Precautions Precautions: Fall Restrictions Weight Bearing Restrictions: Yes LLE Weight Bearing: Partial weight bearing     Mobility  Bed Mobility Overal bed mobility: Needs Assistance Bed Mobility: Supine to Sit;Sit to Supine     Supine to sit: Min assist;Supervision;HOB elevated Sit to supine: Supervision;Min assist;HOB elevated   General bed mobility comments: MinA for LLE due to pain. Does require increased time and effort still with [facility] elevated Patient Response: Cooperative  Transfers Overall transfer level: Needs assistance Equipment used: Rolling walker (2 wheeled) Transfers: Sit to/from [employer] Sit to Stand: From elevated surface;Min assist Stand pivot transfers: Min assist       General transfer comment: Still relies on elevated surface and Mod VC's for hand placement. Due to pain and WB'ing precautions, difficult standing upright. Significant UE reliance on RW.  Ambulation/Gait Ambulation/Gait assistance: Min assist Gait Distance (Feet): 4 Feet Assistive device: Rolling walker (2 wheeled) Gait Pattern/deviations: Step-to pattern;Trunk flexed;Decreased stance time - left;Antalgic Gait velocity: decreased   General Gait Details: ABle to maintain precautions througout. Only able to tolerate limited amb due to his pain.   Stairs             Wheelchair Mobility    Modified Rankin (Stroke Patients Only)       Balance Overall balance assessment: Needs assistance;History  of Falls Sitting-balance support: No upper extremity supported;Feet supported Sitting balance-Leahy Scale: Good     Standing balance support: Bilateral upper extremity supported;During  functional activity Standing balance-Leahy Scale: Poor Standing balance comment: Heavy reliance of UE's on RW for support.                            Cognition Arousal/Alertness: Awake/alert Behavior During Therapy: WFL for tasks assessed/performed Overall Cognitive Status: Within Functional Limits for tasks assessed                                        Exercises Total Joint Exercises Ankle Circles/Pumps: AROM;Strengthening;Both;10 reps Quad Sets: Strengthening;AROM;Left;10 reps Gluteal Sets: 10 reps;Both;Strengthening Short Arc Quad: AROM;Left;10 reps Heel Slides: AROM;Left;10 reps Hip ABduction/ADduction: AAROM;Strengthening;Left;10 reps Straight Leg Raises: 10 reps;Left;Strengthening;AAROM Long Arc Quad: AROM;Strengthening;Both;10 reps Other Exercises Other Exercises: Education on safe hand placement for transfers sitting to standing    General Comments        Pertinent Vitals/Pain Pain Assessment: 0-10 Pain Score: 7  Pain Location: L hip Pain Descriptors / Indicators: Aching;Sore Pain Intervention(s): Limited activity within patient's tolerance;Repositioned;Premedicated before session    Home Living                      Prior Function            PT Goals (current goals can now be found in the care plan section) Acute Rehab PT Goals Patient Stated Goal: Get back to walking PT Goal Formulation: With patient Time For Goal Achievement: 05/25/21 Potential to Achieve Goals: Good Progress towards PT goals: Progressing toward goals    Frequency    BID      PT Plan Current plan remains appropriate    Co-evaluation              AM-PAC PT "6 Clicks" Mobility   Outcome Measure  Help needed turning from your back to your side while in a flat bed without using bedrails?: A Little Help needed moving from lying on your back to sitting on the side of a flat bed without using bedrails?: A Little Help needed moving to and  from a bed to a chair (including a wheelchair)?: A Little Help needed standing up from a chair using your arms (e.g., wheelchair or bedside chair)?: A Lot Help needed to walk in hospital room?: A Lot Help needed climbing 3-5 steps with a railing? : Total 6 Click Score: 14    End of Session Equipment Utilized During Treatment: Gait belt Activity Tolerance: Patient tolerated treatment well;Patient limited by pain Patient left: in bed;with call bell/phone within reach;with bed alarm set;with SCD's reapplied Nurse Communication: Mobility status PT Visit Diagnosis: Unsteadiness on feet (R26.81);History of falling (Z91.81);Other abnormalities of gait and mobility (R26.89);Muscle weakness (generalized) (M62.81);Pain Pain - Right/Left: Left Pain - part of body: Hip     Time: 6433-2951 PT Time Calculation (min) (ACUTE ONLY): 40 min  Charges:  $Therapeutic Exercise: 23-37 mins $Therapeutic Activity: 8-22 mins                     Nayelie Gionfriddo M. Fairly IV, PT, DPT Physical Therapist- [facility]  05/13/2021, 11:54 AM

## 2021-05-13 NOTE — Care Management Important Message (Signed)
Important Message  Patient Details  Name: Jonathon Anderson MRN: [id number] Date of Birth: August 28, 1955   Medicare Important Message Given:  Yes     Olegario Messier A Pamelia Botto 05/13/2021, 12:14 PM

## 2021-05-13 NOTE — Progress Notes (Addendum)
  Subjective: 2 Days Post-Op Procedure(s) (LRB): INTRAMEDULLARY (IM) NAIL INTERTROCHANTRIC OF LEFT SUBTHROCHANTRIC HIP FRACTURE (Left) Patient reports pain as 5 on 0-10 scale.   Patient is well, and has had no acute complaints or problems Plan is to go Skilled nursing facility after hospital stay. Negative for chest pain and shortness of breath Fever: no Gastrointestinal: negative for nausea and vomiting.  Patient has had a bowel movement.  Objective: Vital signs in last 24 hours: Temp:  [97.5 F (36.4 C)-98.5 F (36.9 C)] 98 F (36.7 C) (10/21 0359) Pulse Rate:  [85-105] 85 (10/21 0359) Resp:  [15-20] 20 (10/21 0359) BP: (104-131)/(73-83) 104/76 (10/21 0359) SpO2:  [96 %-100 %] 100 % (10/21 0359)  Intake/Output from previous day:  Intake/Output Summary (Last 24 hours) at 05/13/2021 0748 Last data filed at 05/13/2021 0740 Gross per 24 hour  Intake 120 ml  Output 2100 ml  Net -1980 ml    Intake/Output this shift: Total I/O In: 120 [P.O.:120] Out: 250 [Urine:250]  Labs: Recent Labs    05/10/21 1552 05/11/21 0620 05/12/21 0412 05/13/21 0311  HGB 12.8* 13.3 10.5* 8.9*   Recent Labs    05/12/21 0412 05/13/21 0311  WBC 14.5* 10.6*  RBC 3.16*  3.14* 2.80*  HCT 29.9* 26.2*  PLT 328 267   Recent Labs    05/12/21 0412 05/13/21 0311  NA 131* 134*  K 3.8 4.0  CL 97* 96*  CO2 28 28  BUN 13 14  CREATININE 0.99 0.72  GLUCOSE 150* 119*  CALCIUM 8.3* 8.0*   Recent Labs    05/11/21 0620  INR 1.1     EXAM General - Patient is Alert, Appropriate, and Oriented Extremity - Neurovascular intact Dorsiflexion/Plantar flexion intact Compartment soft Dressing/Incision -clean, dry, no drainage Motor Function - intact, moving foot and toes well on exam.     Assessment/Plan: 2 Days Post-Op Procedure(s) (LRB): INTRAMEDULLARY (IM) NAIL INTERTROCHANTRIC OF LEFT SUBTHROCHANTRIC HIP FRACTURE (Left) Principal Problem:   Closed subtrochanteric fracture of hip, left,  initial encounter (HCC) Active Problems:   Primary hypertension   Mixed hyperlipidemia   Cigarette nicotine dependence, uncomplicated   Hypokalemia  Estimated body mass index is 32.69 kg/m as calculated from the following:   Height as of this encounter: 5\' 8"  (1.727 m).   Weight as of this encounter: 97.5 kg. Advance diet Up with therapy  Likely d/c to STR today.   F/u in 2 weeks at [facility] for staple removal.   DVT Prophylaxis - Lovenox, Ted hose, and foot pumps 50% Weight-Bearing to left leg  [facility], PA-C [facility] 05/13/2021, 7:48 AM

## 2021-05-13 NOTE — Discharge Summary (Signed)
Physician Discharge Summary  [name] QVZ:[id number] DOB: [birth year] DOA: 05/10/2021  PCP: Pcp, No  Admit date: 05/10/2021 Discharge date: 05/13/2021  Admitted From: Home Disposition: SNF  Recommendations for Outpatient Follow-up:  Follow up with PCP in 1-2 weeks Follow-up with orthopedic surgery Please obtain BMP/CBC in one week Please follow up on the following pending results: None  Home Health: No Equipment/Devices: Rolling walker Discharge Condition: Stable CODE STATUS: Full Diet recommendation: Heart Healthy   Brief/Interim Summary: Jonathon Anderson is a 65 y.o. male with medical history significant for hypertension, hyperlipidemia, smoker, who presents to the emergency department on 05/10/2021 with left hip pain after a fall at home. He slipped on the floor at home and landed on his left hip on the stairs. Found to have an acute mildly displaced left subtrochanteric fracture.  Orthopedic was consulted and he was taken to the OR s/p ORIF.  Tolerated the procedure well. Our physical therapist suggested going to rehab before returning home, he is being discharged to rehab for further management.  He will follow-up with orthopedic surgery within next 1 to 2 weeks for further recommendations and removal of staples.  He was also given 2 weeks of Lovenox for DVT prophylaxis.  Patient developed acute blood loss anemia postoperatively.  Although blood loss mentioned in the operating note was 125 cc but his hemoglobin dropped quite significantly.  Hemoglobin on the day of discharge was 8.9.  No significant postoperative bleeding noted.  Anemia panel with some anemia of chronic disease, low folate levels, some iron deficiency and B12 levels at 224 which are within lower normal limit.  He was started on supplements and should continue with them.  He needs to have his regular CBC checked to make sure that it is improving.  Will continue his home dose of antihypertensives on  discharge.  Patient will continue with rest of his home medications and follow-up with his providers.  Discharge Diagnoses:  Principal Problem:   Closed subtrochanteric fracture of hip, left, initial encounter [facility]) Active Problems:   Primary hypertension   Mixed hyperlipidemia   Cigarette nicotine dependence, uncomplicated   Hypokalemia   Discharge Instructions  Discharge Instructions     Diet - low sodium heart healthy   Complete by: As directed    Increase activity slowly   Complete by: As directed    Leave dressing on - Keep it clean, dry, and intact until clinic visit   Complete by: As directed       Allergies as of 05/13/2021   No Known Allergies      Medication List     TAKE these medications    aspirin EC 81 MG tablet Take 81 mg by mouth daily. Swallow whole.   atorvastatin 20 MG tablet Commonly known as: LIPITOR Take 20 mg by mouth at bedtime.   bisacodyl 10 MG suppository Commonly known as: DULCOLAX Place 1 suppository (10 mg total) rectally daily as needed for moderate constipation.   cyclobenzaprine 10 MG tablet Commonly known as: FLEXERIL Take 10 mg by mouth 2 (two) times daily as needed.   diclofenac 75 MG EC tablet Commonly known as: VOLTAREN Take 75 mg by mouth 2 (two) times daily.   enoxaparin 40 MG/0.4ML injection Commonly known as: LOVENOX Inject 0.4 mLs (40 mg total) into the skin daily for 14 days.   ferrous fumarate-b12-vitamic C-folic acid capsule Commonly known as: TRINSICON / FOLTRIN Take 1 capsule by mouth 3 (three) times daily after meals.   HYDROcodone-acetaminophen 5-325  MG tablet Commonly known as: NORCO/VICODIN Take 1 tablet by mouth every 4 (four) hours as needed for moderate pain.   magnesium oxide 400 (240 Mg) MG tablet Commonly known as: MAG-OX Take 1 tablet (400 mg total) by mouth daily. Start taking on: May 14, 2021   metoprolol tartrate 100 MG tablet Commonly known as: LOPRESSOR Take 100 mg by mouth 2  (two) times daily.   mupirocin ointment 2 % Commonly known as: BACTROBAN Place 1 application into the nose 2 (two) times daily for 3 days.   senna-docusate 8.6-50 MG tablet Commonly known as: Senokot-S Take 1 tablet by mouth at bedtime.   thiamine 100 MG tablet Take 1 tablet (100 mg total) by mouth daily. Start taking on: May 14, 2021   triamterene-hydrochlorothiazide 75-50 MG tablet Commonly known as: MAXZIDE Take 1 tablet by mouth every morning.               Discharge Care Instructions  (From admission, onward)           Start     Ordered   05/13/21 0000  Leave dressing on - Keep it clean, dry, and intact until clinic visit        05/13/21 1116            Contact information for follow-up providers     Anson [place], PA-C Follow up in 2 week(s).   Specialty: Physician Assistant Contact information: [address] [place] [address] [phone]              Contact information for after-discharge care     Destination     [facility] Preferred SNF .   Service: Skilled Nursing Contact information: [address] [place] [address] [phone]                    No Known Allergies  Consultations: Orthopedic surgery  Procedures/Studies: DG Chest Portable 1 View  Result Date: 05/10/2021 CLINICAL DATA:  Fall EXAM: PORTABLE CHEST 1 VIEW COMPARISON:  None. FINDINGS: The heart size and mediastinal contours are within normal limits. Both lungs are clear. The visualized skeletal structures are unremarkable. IMPRESSION: No active disease. Electronically Signed   By: Jasmine Pang M.D.   On: 05/10/2021 16:53   DG C-Arm 1-60 Min  Result Date: 05/11/2021 CLINICAL DATA:  Surgery EXAM: DG C-ARM 1-60 MIN FLUOROSCOPY TIME:  Fluoroscopy Time:  1 minute 12 seconds Radiation Exposure Index (if provided by the fluoroscopic device): 22.2 mGy Number of Acquired Spot  Images: 7 images COMPARISON:  Radiograph 05/11/2021 FINDINGS: Intraoperative images were obtained during left intertrochanteric femur fracture fixation with intramedullary nailing. IMPRESSION: Intraoperative images were obtained during left intertrochanteric femur fracture fixation with intramedullary nailing. Electronically Signed   By: Caprice Renshaw M.D.   On: 05/11/2021 17:50   DG C-Arm 1-60 Min  Result Date: 05/11/2021 CLINICAL DATA:  Surgery EXAM: DG C-ARM 1-60 MIN FLUOROSCOPY TIME:  Fluoroscopy Time:  1 minute 12 seconds Radiation Exposure Index (if provided by the fluoroscopic device): 22.2 mGy Number of Acquired Spot Images: 7 images COMPARISON:  Radiograph 05/10/2021 FINDINGS: Intraoperative images were obtained during intertrochanteric fracture fixation with intramedullary nailing. IMPRESSION: Intraoperative images were obtained during intertrochanteric fracture fixation with intramedullary nailing. Electronically Signed   By: Caprice Renshaw M.D.   On: 05/11/2021 17:49   DG Hip Unilat With Pelvis 2-3 Views Left  Result Date: 05/10/2021 CLINICAL DATA:  Fall with hip pain EXAM: DG  HIP (WITH OR WITHOUT PELVIS) 2-3V LEFT COMPARISON:  None. FINDINGS: There is an acute subtrochanteric fracture on the left with approximately 1.0 cm medial displacement, 1.0 cm anterior displacement, and slight posterior angulation of the dominant distal fragment. Femoroacetabular alignment is maintained. There is no fracture on the right. The SI joints and symphysis pubis are intact. IMPRESSION: Acute mildly displaced left subtrochanteric fracture. Electronically Signed   By: Lesia Hausen M.D.   On: 05/10/2021 16:53   DG FEMUR MIN 2 VIEWS LEFT  Result Date: 05/11/2021 CLINICAL DATA:  Status post left femur intramedullary nail EXAM: LEFT FEMUR 2 VIEWS COMPARISON:  None. FINDINGS: Postsurgical changes of left femur intramedullary nailing for an intertrochanteric fracture. Improved alignment. Expected soft tissue changes.  IMPRESSION: Postsurgical changes of left femur intramedullary nailing for an intertrochanteric fracture. Improved alignment. Expected soft tissue changes. Electronically Signed   By: Caprice Renshaw M.D.   On: 05/11/2021 17:51   DG FEMUR MIN 2 VIEWS LEFT  Result Date: 05/11/2021 CLINICAL DATA:  Left femur surgery EXAM: LEFT FEMUR 2 VIEWS COMPARISON:  Hip radiograph 05/10/2021 FINDINGS: Intraoperative images during left femur intramedullary narrowing and intertrochanteric fracture fixation. Intact hardware without evidence of immediate complication. IMPRESSION: Intraoperative images during intertrochanteric fracture fixation with intramedullary nailing. No evidence of immediate complication. Electronically Signed   By: Caprice Renshaw M.D.   On: 05/11/2021 17:48    Subjective: Patient was seen and examined today.  No new complaints.  Ready to move on to rehab. Brother at bedside.  Discharge Exam: Vitals:   05/13/21 0700 05/13/21 1115  BP: 108/73 136/80  Pulse: 92 (!) 101  Resp: 18 19  Temp: 98.2 F (36.8 C) 98.4 F (36.9 C)  SpO2: 100% 97%   Vitals:   05/13/21 0006 05/13/21 0359 05/13/21 0700 05/13/21 1115  BP: 131/73 104/76 108/73 136/80  Pulse: 98 85 92 (!) 101  Resp: 20 20 18 19   Temp: (!) 97.5 F (36.4 C) 98 F (36.7 C) 98.2 F (36.8 C) 98.4 F (36.9 C)  TempSrc: Oral Oral Oral Oral  SpO2: 96% 100% 100% 97%  Weight:      Height:        General: Pt is alert, awake, not in acute distress Cardiovascular: RRR, S1/S2 +, no rubs, no gallops Respiratory: CTA bilaterally, no wheezing, no rhonchi Abdominal: Soft, NT, ND, bowel sounds + Extremities: no edema, no cyanosis   The results of significant diagnostics from this hospitalization (including imaging, microbiology, ancillary and laboratory) are listed below for reference.    Microbiology: Recent Results (from the past 240 hour(s))  Resp Panel by RT-PCR (Flu A&B, Covid) Nasopharyngeal Swab     Status: None   Collection Time:  05/10/21  3:45 PM   Specimen: Nasopharyngeal Swab; Nasopharyngeal(NP) swabs in vial transport medium  Result Value Ref Range Status   SARS Coronavirus 2 by RT PCR NEGATIVE NEGATIVE Final    Comment: (NOTE) SARS-CoV-2 target nucleic acids are NOT DETECTED.  The SARS-CoV-2 RNA is generally detectable in upper respiratory specimens during the acute phase of infection. The lowest concentration of SARS-CoV-2 viral copies this assay can detect is 138 copies/mL. A negative result does not preclude SARS-Cov-2 infection and should not be used as the sole basis for treatment or other patient management decisions. A negative result may occur with  improper specimen collection/handling, submission of specimen other than nasopharyngeal swab, presence of viral mutation(s) within the areas targeted by this assay, and inadequate number of viral copies(<138 copies/mL). A negative result  must be combined with clinical observations, patient history, and epidemiological information. The expected result is Negative.  Fact Sheet for Patients:  BloggerCourse.com  Fact Sheet for Healthcare Providers:  SeriousBroker.it  This test is no t yet approved or cleared by the [place] FDA and  has been authorized for detection and/or diagnosis of SARS-CoV-2 by FDA under an Emergency Use Authorization (EUA). This EUA will remain  in effect (meaning this test can be used) for the duration of the COVID-19 declaration under Section 564(b)(1) of the Act, 21 U.S.C.section 360bbb-3(b)(1), unless the authorization is terminated  or revoked sooner.       Influenza A by PCR NEGATIVE NEGATIVE Final   Influenza B by PCR NEGATIVE NEGATIVE Final    Comment: (NOTE) The Xpert Xpress SARS-CoV-2/FLU/RSV plus assay is intended as an aid in the diagnosis of influenza from Nasopharyngeal swab specimens and should not be used as a sole basis for treatment. Nasal washings  and aspirates are unacceptable for Xpert Xpress SARS-CoV-2/FLU/RSV testing.  Fact Sheet for Patients: BloggerCourse.com  Fact Sheet for Healthcare Providers: SeriousBroker.it  This test is not yet approved or cleared by the [place] FDA and has been authorized for detection and/or diagnosis of SARS-CoV-2 by FDA under an Emergency Use Authorization (EUA). This EUA will remain in effect (meaning this test can be used) for the duration of the COVID-19 declaration under Section 564(b)(1) of the Act, 21 U.S.C. section 360bbb-3(b)(1), unless the authorization is terminated or revoked.  Performed at [facility], [address] [place], [place] [address]   Surgical pcr screen     Status: Abnormal   Collection Time: 05/11/21 10:03 AM   Specimen: Nasal Mucosa; Nasal Swab  Result Value Ref Range Status   MRSA, PCR NEGATIVE NEGATIVE Final   Staphylococcus aureus POSITIVE (A) NEGATIVE Final    Comment: (NOTE) The Xpert SA Assay (FDA approved for NASAL specimens in patients 58 years of age and older), is one component of a comprehensive surveillance program. It is not intended to diagnose infection nor to guide or monitor treatment. Performed at [facility], [address] [place], [place] [address]      Labs: BNP (last 3 results) No results for input(s): BNP in the last 8760 hours. Basic Metabolic Panel: Recent Labs  Lab 05/10/21 1552 05/11/21 0620 05/12/21 0412 05/13/21 0311  NA 132* 134* 131* 134*  K 3.0* 3.8 3.8 4.0  CL 95* 96* 97* 96*  CO2 GLUCOSE 136* 108* 150* 119*  BUN 8 7* 13 14  CREATININE 0.78 0.67 0.99 0.72  CALCIUM 8.6* 8.9 8.3* 8.0*  MG 1.9  --   --   --    Liver Function Tests: Recent Labs  Lab 05/10/21 1552  AST 16  ALT 12  ALKPHOS 86  BILITOT 0.6  PROT 7.4  ALBUMIN 3.7   No results for input(s): LIPASE, AMYLASE in the last 168 hours. No results for  input(s): AMMONIA in the last 168 hours. CBC: Recent Labs  Lab 05/10/21 1552 05/11/21 0620 05/12/21 0412 05/13/21 0311  WBC 7.4 9.0 14.5* 10.6*  NEUTROABS 5.2  --   --   --   HGB 12.8* 13.3 10.5* 8.9*  HCT 37.1* 39.7 29.9* 26.2*  MCV 94.6 94.1 94.6 93.6  PLT 314 340 328 267   Cardiac Enzymes: No results for input(s): CKTOTAL, CKMB, CKMBINDEX, TROPONINI in the last 168 hours. BNP: Invalid input(s): POCBNP CBG: Recent Labs  Lab 05/12/21 2206  GLUCAP 144*   D-Dimer No results for input(s): DDIMER in the last 72 hours. Hgb A1c No results for input(s): HGBA1C in the last 72 hours. Lipid Profile No results for input(s): CHOL, HDL, LDLCALC, TRIG, CHOLHDL, LDLDIRECT in the last 72 hours. Thyroid function studies No results for input(s): TSH, T4TOTAL, T3FREE, THYROIDAB in the last 72 hours.  Invalid input(s): FREET3 Anemia work up [employer]    05/12/21 0412 05/12/21 0957  VITAMINB12  --  224  FOLATE 4.2*  --   FERRITIN 86  --   TIBC 239*  --   IRON 28*  --   RETICCTPCT 2.5  --    Urinalysis No results found for: COLORURINE, APPEARANCEUR, LABSPEC, PHURINE, GLUCOSEU, HGBUR, BILIRUBINUR, KETONESUR, PROTEINUR, UROBILINOGEN, NITRITE, LEUKOCYTESUR Sepsis Labs Invalid input(s): PROCALCITONIN,  WBC,  LACTICIDVEN Microbiology Recent Results (from the past 240 hour(s))  Resp Panel by RT-PCR (Flu A&B, Covid) Nasopharyngeal Swab     Status: None   Collection Time: 05/10/21  3:45 PM   Specimen: Nasopharyngeal Swab; Nasopharyngeal(NP) swabs in vial transport medium  Result Value Ref Range Status   SARS Coronavirus 2 by RT PCR NEGATIVE NEGATIVE Final    Comment: (NOTE) SARS-CoV-2 target nucleic acids are NOT DETECTED.  The SARS-CoV-2 RNA is generally detectable in upper respiratory specimens during the acute phase of infection. The lowest concentration of SARS-CoV-2 viral copies this assay can detect is 138 copies/mL. A negative result does not preclude SARS-Cov-2 infection  and should not be used as the sole basis for treatment or other patient management decisions. A negative result may occur with  improper specimen collection/handling, submission of specimen other than nasopharyngeal swab, presence of viral mutation(s) within the areas targeted by this assay, and inadequate number of viral copies(<138 copies/mL). A negative result must be combined with clinical observations, patient history, and epidemiological information. The expected result is Negative.  Fact Sheet for Patients:  BloggerCourse.com  Fact Sheet for Healthcare Providers:  SeriousBroker.it  This test is no t yet approved or cleared by the [place] FDA and  has been authorized for detection and/or diagnosis of SARS-CoV-2 by FDA under an Emergency Use Authorization (EUA). This EUA will remain  in effect (meaning this test can be used) for the duration of the COVID-19 declaration under Section 564(b)(1) of the Act, 21 U.S.C.section 360bbb-3(b)(1), unless the authorization is terminated  or revoked sooner.       Influenza A by PCR NEGATIVE NEGATIVE Final   Influenza B by PCR NEGATIVE NEGATIVE Final    Comment: (NOTE) The Xpert Xpress SARS-CoV-2/FLU/RSV plus assay is intended as an aid in the diagnosis of influenza from Nasopharyngeal swab specimens and should not be used as a sole basis for treatment. Nasal washings and aspirates are unacceptable for Xpert Xpress SARS-CoV-2/FLU/RSV testing.  Fact Sheet for Patients: BloggerCourse.com  Fact Sheet for Healthcare Providers: SeriousBroker.it  This test is not yet approved or cleared by the [place] FDA and has been authorized for detection and/or diagnosis of SARS-CoV-2 by FDA under an Emergency Use Authorization (EUA). This EUA will remain in effect (meaning this test can be used) for the duration of the COVID-19 declaration  under Section 564(b)(1) of the Act, 21 U.S.C. section 360bbb-3(b)(1), unless the authorization is terminated or revoked.  Performed at [facility], [address], [place], [place] [address]   Surgical pcr screen     Status: Abnormal   Collection Time: 05/11/21 10:03 AM   Specimen: Nasal Mucosa; Nasal Swab  Result  Value Ref Range Status   MRSA, PCR NEGATIVE NEGATIVE Final   Staphylococcus aureus POSITIVE (A) NEGATIVE Final    Comment: (NOTE) The Xpert SA Assay (FDA approved for NASAL specimens in patients 66 years of age and older), is one component of a comprehensive surveillance program. It is not intended to diagnose infection nor to guide or monitor treatment. Performed at [facility], [address] [place], [place] [address]     Time coordinating discharge: Over 30 minutes  SIGNED:  Arnetha Courser, MD  [facility] 05/13/2021, 11:17 AM  If 7PM-7AM, please contact night-coverage [email or web address]  This record has been created using Conservation officer, historic buildings. Errors have been sought and corrected,but may not always be located. Such creation errors do not reflect on the standard of care.

## 2021-05-14 DIAGNOSIS — D509 Iron deficiency anemia, unspecified: Secondary | ICD-10-CM | POA: Diagnosis not present

## 2021-05-14 DIAGNOSIS — S7222XD Displaced subtrochanteric fracture of left femur, subsequent encounter for closed fracture with routine healing: Secondary | ICD-10-CM | POA: Diagnosis not present

## 2021-05-14 DIAGNOSIS — F17200 Nicotine dependence, unspecified, uncomplicated: Secondary | ICD-10-CM | POA: Diagnosis not present

## 2021-05-14 DIAGNOSIS — I10 Essential (primary) hypertension: Secondary | ICD-10-CM | POA: Diagnosis not present

## 2021-05-17 DIAGNOSIS — I10 Essential (primary) hypertension: Secondary | ICD-10-CM | POA: Diagnosis not present

## 2021-05-17 DIAGNOSIS — D509 Iron deficiency anemia, unspecified: Secondary | ICD-10-CM | POA: Diagnosis not present

## 2021-05-17 DIAGNOSIS — F17200 Nicotine dependence, unspecified, uncomplicated: Secondary | ICD-10-CM | POA: Diagnosis not present

## 2021-05-17 DIAGNOSIS — E785 Hyperlipidemia, unspecified: Secondary | ICD-10-CM | POA: Diagnosis not present

## 2021-05-17 DIAGNOSIS — S7222XD Displaced subtrochanteric fracture of left femur, subsequent encounter for closed fracture with routine healing: Secondary | ICD-10-CM | POA: Diagnosis not present

## 2021-05-18 DIAGNOSIS — I10 Essential (primary) hypertension: Secondary | ICD-10-CM | POA: Diagnosis not present

## 2021-05-18 DIAGNOSIS — U071 COVID-19: Secondary | ICD-10-CM | POA: Diagnosis not present

## 2021-05-18 DIAGNOSIS — S7222XD Displaced subtrochanteric fracture of left femur, subsequent encounter for closed fracture with routine healing: Secondary | ICD-10-CM | POA: Diagnosis not present

## 2021-05-20 DIAGNOSIS — F17200 Nicotine dependence, unspecified, uncomplicated: Secondary | ICD-10-CM | POA: Diagnosis not present

## 2021-05-20 DIAGNOSIS — M6281 Muscle weakness (generalized): Secondary | ICD-10-CM | POA: Diagnosis not present

## 2021-05-20 DIAGNOSIS — U071 COVID-19: Secondary | ICD-10-CM | POA: Diagnosis not present

## 2021-05-20 DIAGNOSIS — S7222XD Displaced subtrochanteric fracture of left femur, subsequent encounter for closed fracture with routine healing: Secondary | ICD-10-CM | POA: Diagnosis not present

## 2021-05-24 DIAGNOSIS — U071 COVID-19: Secondary | ICD-10-CM | POA: Diagnosis not present

## 2021-05-24 DIAGNOSIS — D649 Anemia, unspecified: Secondary | ICD-10-CM | POA: Diagnosis not present

## 2021-05-24 DIAGNOSIS — S7222XD Displaced subtrochanteric fracture of left femur, subsequent encounter for closed fracture with routine healing: Secondary | ICD-10-CM | POA: Diagnosis not present

## 2021-05-24 DIAGNOSIS — E871 Hypo-osmolality and hyponatremia: Secondary | ICD-10-CM | POA: Diagnosis not present

## 2021-05-25 DIAGNOSIS — S7222XD Displaced subtrochanteric fracture of left femur, subsequent encounter for closed fracture with routine healing: Secondary | ICD-10-CM | POA: Diagnosis not present

## 2021-05-25 DIAGNOSIS — I10 Essential (primary) hypertension: Secondary | ICD-10-CM | POA: Diagnosis not present

## 2021-05-25 DIAGNOSIS — F17200 Nicotine dependence, unspecified, uncomplicated: Secondary | ICD-10-CM | POA: Diagnosis not present

## 2021-05-25 DIAGNOSIS — D649 Anemia, unspecified: Secondary | ICD-10-CM | POA: Diagnosis not present

## 2021-05-25 DIAGNOSIS — E785 Hyperlipidemia, unspecified: Secondary | ICD-10-CM | POA: Diagnosis not present

## 2021-05-25 DIAGNOSIS — U071 COVID-19: Secondary | ICD-10-CM | POA: Diagnosis not present

## 2021-05-25 DIAGNOSIS — E871 Hypo-osmolality and hyponatremia: Secondary | ICD-10-CM | POA: Diagnosis not present

## 2021-05-31 DIAGNOSIS — E876 Hypokalemia: Secondary | ICD-10-CM | POA: Diagnosis not present

## 2021-05-31 DIAGNOSIS — S7222XD Displaced subtrochanteric fracture of left femur, subsequent encounter for closed fracture with routine healing: Secondary | ICD-10-CM | POA: Diagnosis not present

## 2021-05-31 DIAGNOSIS — E871 Hypo-osmolality and hyponatremia: Secondary | ICD-10-CM | POA: Diagnosis not present

## 2021-05-31 DIAGNOSIS — Z7982 Long term (current) use of aspirin: Secondary | ICD-10-CM | POA: Diagnosis not present

## 2021-05-31 DIAGNOSIS — W19XXXD Unspecified fall, subsequent encounter: Secondary | ICD-10-CM | POA: Diagnosis not present

## 2021-05-31 DIAGNOSIS — Z8616 Personal history of COVID-19: Secondary | ICD-10-CM | POA: Diagnosis not present

## 2021-05-31 DIAGNOSIS — D62 Acute posthemorrhagic anemia: Secondary | ICD-10-CM | POA: Diagnosis not present

## 2021-05-31 DIAGNOSIS — Z791 Long term (current) use of non-steroidal anti-inflammatories (NSAID): Secondary | ICD-10-CM | POA: Diagnosis not present

## 2021-05-31 DIAGNOSIS — J989 Respiratory disorder, unspecified: Secondary | ICD-10-CM | POA: Diagnosis not present

## 2021-05-31 DIAGNOSIS — M519 Unspecified thoracic, thoracolumbar and lumbosacral intervertebral disc disorder: Secondary | ICD-10-CM | POA: Diagnosis not present

## 2021-05-31 DIAGNOSIS — I10 Essential (primary) hypertension: Secondary | ICD-10-CM | POA: Diagnosis not present

## 2021-05-31 DIAGNOSIS — M170 Bilateral primary osteoarthritis of knee: Secondary | ICD-10-CM | POA: Diagnosis not present

## 2021-05-31 DIAGNOSIS — Z9181 History of falling: Secondary | ICD-10-CM | POA: Diagnosis not present

## 2021-05-31 DIAGNOSIS — E782 Mixed hyperlipidemia: Secondary | ICD-10-CM | POA: Diagnosis not present

## 2021-05-31 DIAGNOSIS — F17210 Nicotine dependence, cigarettes, uncomplicated: Secondary | ICD-10-CM | POA: Diagnosis not present

## 2021-06-01 DIAGNOSIS — I10 Essential (primary) hypertension: Secondary | ICD-10-CM | POA: Diagnosis not present

## 2021-06-01 DIAGNOSIS — E876 Hypokalemia: Secondary | ICD-10-CM | POA: Diagnosis not present

## 2021-06-01 DIAGNOSIS — D62 Acute posthemorrhagic anemia: Secondary | ICD-10-CM | POA: Diagnosis not present

## 2021-06-01 DIAGNOSIS — W19XXXD Unspecified fall, subsequent encounter: Secondary | ICD-10-CM | POA: Diagnosis not present

## 2021-06-01 DIAGNOSIS — S7222XD Displaced subtrochanteric fracture of left femur, subsequent encounter for closed fracture with routine healing: Secondary | ICD-10-CM | POA: Diagnosis not present

## 2021-06-01 DIAGNOSIS — E871 Hypo-osmolality and hyponatremia: Secondary | ICD-10-CM | POA: Diagnosis not present

## 2021-06-02 DIAGNOSIS — Z6829 Body mass index (BMI) 29.0-29.9, adult: Secondary | ICD-10-CM | POA: Diagnosis not present

## 2021-06-02 DIAGNOSIS — E871 Hypo-osmolality and hyponatremia: Secondary | ICD-10-CM | POA: Diagnosis not present

## 2021-06-02 DIAGNOSIS — Z8781 Personal history of (healed) traumatic fracture: Secondary | ICD-10-CM | POA: Diagnosis not present

## 2021-06-02 DIAGNOSIS — D62 Acute posthemorrhagic anemia: Secondary | ICD-10-CM | POA: Diagnosis not present

## 2021-06-02 DIAGNOSIS — Z79899 Other long term (current) drug therapy: Secondary | ICD-10-CM | POA: Diagnosis not present

## 2021-06-02 DIAGNOSIS — Z23 Encounter for immunization: Secondary | ICD-10-CM | POA: Diagnosis not present

## 2021-06-08 DIAGNOSIS — E876 Hypokalemia: Secondary | ICD-10-CM | POA: Diagnosis not present

## 2021-06-08 DIAGNOSIS — S7222XD Displaced subtrochanteric fracture of left femur, subsequent encounter for closed fracture with routine healing: Secondary | ICD-10-CM | POA: Diagnosis not present

## 2021-06-08 DIAGNOSIS — I10 Essential (primary) hypertension: Secondary | ICD-10-CM | POA: Diagnosis not present

## 2021-06-08 DIAGNOSIS — W19XXXD Unspecified fall, subsequent encounter: Secondary | ICD-10-CM | POA: Diagnosis not present

## 2021-06-08 DIAGNOSIS — D62 Acute posthemorrhagic anemia: Secondary | ICD-10-CM | POA: Diagnosis not present

## 2021-06-08 DIAGNOSIS — E871 Hypo-osmolality and hyponatremia: Secondary | ICD-10-CM | POA: Diagnosis not present

## 2021-06-14 DIAGNOSIS — I10 Essential (primary) hypertension: Secondary | ICD-10-CM | POA: Diagnosis not present

## 2021-06-14 DIAGNOSIS — E871 Hypo-osmolality and hyponatremia: Secondary | ICD-10-CM | POA: Diagnosis not present

## 2021-06-14 DIAGNOSIS — S7222XD Displaced subtrochanteric fracture of left femur, subsequent encounter for closed fracture with routine healing: Secondary | ICD-10-CM | POA: Diagnosis not present

## 2021-06-14 DIAGNOSIS — D62 Acute posthemorrhagic anemia: Secondary | ICD-10-CM | POA: Diagnosis not present

## 2021-06-14 DIAGNOSIS — W19XXXD Unspecified fall, subsequent encounter: Secondary | ICD-10-CM | POA: Diagnosis not present

## 2021-06-14 DIAGNOSIS — E876 Hypokalemia: Secondary | ICD-10-CM | POA: Diagnosis not present

## 2021-06-17 DIAGNOSIS — I10 Essential (primary) hypertension: Secondary | ICD-10-CM | POA: Diagnosis not present

## 2021-06-17 DIAGNOSIS — E871 Hypo-osmolality and hyponatremia: Secondary | ICD-10-CM | POA: Diagnosis not present

## 2021-06-17 DIAGNOSIS — E876 Hypokalemia: Secondary | ICD-10-CM | POA: Diagnosis not present

## 2021-06-17 DIAGNOSIS — W19XXXD Unspecified fall, subsequent encounter: Secondary | ICD-10-CM | POA: Diagnosis not present

## 2021-06-17 DIAGNOSIS — D62 Acute posthemorrhagic anemia: Secondary | ICD-10-CM | POA: Diagnosis not present

## 2021-06-17 DIAGNOSIS — S7222XD Displaced subtrochanteric fracture of left femur, subsequent encounter for closed fracture with routine healing: Secondary | ICD-10-CM | POA: Diagnosis not present

## 2021-06-21 DIAGNOSIS — E871 Hypo-osmolality and hyponatremia: Secondary | ICD-10-CM | POA: Diagnosis not present

## 2021-06-21 DIAGNOSIS — S7222XD Displaced subtrochanteric fracture of left femur, subsequent encounter for closed fracture with routine healing: Secondary | ICD-10-CM | POA: Diagnosis not present

## 2021-06-21 DIAGNOSIS — I10 Essential (primary) hypertension: Secondary | ICD-10-CM | POA: Diagnosis not present

## 2021-06-21 DIAGNOSIS — W19XXXD Unspecified fall, subsequent encounter: Secondary | ICD-10-CM | POA: Diagnosis not present

## 2021-06-21 DIAGNOSIS — D62 Acute posthemorrhagic anemia: Secondary | ICD-10-CM | POA: Diagnosis not present

## 2021-06-21 DIAGNOSIS — E876 Hypokalemia: Secondary | ICD-10-CM | POA: Diagnosis not present

## 2021-06-23 DIAGNOSIS — I10 Essential (primary) hypertension: Secondary | ICD-10-CM | POA: Diagnosis not present

## 2021-06-23 DIAGNOSIS — S7222XD Displaced subtrochanteric fracture of left femur, subsequent encounter for closed fracture with routine healing: Secondary | ICD-10-CM | POA: Diagnosis not present

## 2021-06-23 DIAGNOSIS — E876 Hypokalemia: Secondary | ICD-10-CM | POA: Diagnosis not present

## 2021-06-23 DIAGNOSIS — W19XXXD Unspecified fall, subsequent encounter: Secondary | ICD-10-CM | POA: Diagnosis not present

## 2021-06-23 DIAGNOSIS — D62 Acute posthemorrhagic anemia: Secondary | ICD-10-CM | POA: Diagnosis not present

## 2021-06-23 DIAGNOSIS — E871 Hypo-osmolality and hyponatremia: Secondary | ICD-10-CM | POA: Diagnosis not present

## 2021-06-24 DIAGNOSIS — S7222XD Displaced subtrochanteric fracture of left femur, subsequent encounter for closed fracture with routine healing: Secondary | ICD-10-CM | POA: Diagnosis not present

## 2021-06-27 DIAGNOSIS — D62 Acute posthemorrhagic anemia: Secondary | ICD-10-CM | POA: Diagnosis not present

## 2021-06-27 DIAGNOSIS — S7222XD Displaced subtrochanteric fracture of left femur, subsequent encounter for closed fracture with routine healing: Secondary | ICD-10-CM | POA: Diagnosis not present

## 2021-06-27 DIAGNOSIS — W19XXXD Unspecified fall, subsequent encounter: Secondary | ICD-10-CM | POA: Diagnosis not present

## 2021-06-27 DIAGNOSIS — I10 Essential (primary) hypertension: Secondary | ICD-10-CM | POA: Diagnosis not present

## 2021-06-27 DIAGNOSIS — E876 Hypokalemia: Secondary | ICD-10-CM | POA: Diagnosis not present

## 2021-06-27 DIAGNOSIS — E871 Hypo-osmolality and hyponatremia: Secondary | ICD-10-CM | POA: Diagnosis not present

## 2021-06-29 DIAGNOSIS — E782 Mixed hyperlipidemia: Secondary | ICD-10-CM | POA: Diagnosis not present

## 2021-06-29 DIAGNOSIS — E876 Hypokalemia: Secondary | ICD-10-CM | POA: Diagnosis not present

## 2021-06-29 DIAGNOSIS — M545 Low back pain, unspecified: Secondary | ICD-10-CM | POA: Diagnosis not present

## 2021-06-29 DIAGNOSIS — D62 Acute posthemorrhagic anemia: Secondary | ICD-10-CM | POA: Diagnosis not present

## 2021-06-29 DIAGNOSIS — Z79899 Other long term (current) drug therapy: Secondary | ICD-10-CM | POA: Diagnosis not present

## 2021-06-29 DIAGNOSIS — Z6830 Body mass index (BMI) 30.0-30.9, adult: Secondary | ICD-10-CM | POA: Diagnosis not present

## 2021-06-29 DIAGNOSIS — E871 Hypo-osmolality and hyponatremia: Secondary | ICD-10-CM | POA: Diagnosis not present

## 2021-06-29 DIAGNOSIS — I10 Essential (primary) hypertension: Secondary | ICD-10-CM | POA: Diagnosis not present

## 2021-06-29 DIAGNOSIS — S7222XD Displaced subtrochanteric fracture of left femur, subsequent encounter for closed fracture with routine healing: Secondary | ICD-10-CM | POA: Diagnosis not present

## 2021-06-29 DIAGNOSIS — W19XXXD Unspecified fall, subsequent encounter: Secondary | ICD-10-CM | POA: Diagnosis not present

## 2021-06-30 DIAGNOSIS — D62 Acute posthemorrhagic anemia: Secondary | ICD-10-CM | POA: Diagnosis not present

## 2021-06-30 DIAGNOSIS — Z8616 Personal history of COVID-19: Secondary | ICD-10-CM | POA: Diagnosis not present

## 2021-06-30 DIAGNOSIS — J989 Respiratory disorder, unspecified: Secondary | ICD-10-CM | POA: Diagnosis not present

## 2021-06-30 DIAGNOSIS — Z7982 Long term (current) use of aspirin: Secondary | ICD-10-CM | POA: Diagnosis not present

## 2021-06-30 DIAGNOSIS — F17210 Nicotine dependence, cigarettes, uncomplicated: Secondary | ICD-10-CM | POA: Diagnosis not present

## 2021-06-30 DIAGNOSIS — W19XXXD Unspecified fall, subsequent encounter: Secondary | ICD-10-CM | POA: Diagnosis not present

## 2021-06-30 DIAGNOSIS — E876 Hypokalemia: Secondary | ICD-10-CM | POA: Diagnosis not present

## 2021-06-30 DIAGNOSIS — Z9181 History of falling: Secondary | ICD-10-CM | POA: Diagnosis not present

## 2021-06-30 DIAGNOSIS — M519 Unspecified thoracic, thoracolumbar and lumbosacral intervertebral disc disorder: Secondary | ICD-10-CM | POA: Diagnosis not present

## 2021-06-30 DIAGNOSIS — M170 Bilateral primary osteoarthritis of knee: Secondary | ICD-10-CM | POA: Diagnosis not present

## 2021-06-30 DIAGNOSIS — E871 Hypo-osmolality and hyponatremia: Secondary | ICD-10-CM | POA: Diagnosis not present

## 2021-06-30 DIAGNOSIS — I10 Essential (primary) hypertension: Secondary | ICD-10-CM | POA: Diagnosis not present

## 2021-06-30 DIAGNOSIS — S7222XD Displaced subtrochanteric fracture of left femur, subsequent encounter for closed fracture with routine healing: Secondary | ICD-10-CM | POA: Diagnosis not present

## 2021-06-30 DIAGNOSIS — Z791 Long term (current) use of non-steroidal anti-inflammatories (NSAID): Secondary | ICD-10-CM | POA: Diagnosis not present

## 2021-06-30 DIAGNOSIS — E782 Mixed hyperlipidemia: Secondary | ICD-10-CM | POA: Diagnosis not present

## 2021-07-04 DIAGNOSIS — S7222XD Displaced subtrochanteric fracture of left femur, subsequent encounter for closed fracture with routine healing: Secondary | ICD-10-CM | POA: Diagnosis not present

## 2021-07-04 DIAGNOSIS — W19XXXD Unspecified fall, subsequent encounter: Secondary | ICD-10-CM | POA: Diagnosis not present

## 2021-07-04 DIAGNOSIS — I10 Essential (primary) hypertension: Secondary | ICD-10-CM | POA: Diagnosis not present

## 2021-07-04 DIAGNOSIS — E876 Hypokalemia: Secondary | ICD-10-CM | POA: Diagnosis not present

## 2021-07-04 DIAGNOSIS — D62 Acute posthemorrhagic anemia: Secondary | ICD-10-CM | POA: Diagnosis not present

## 2021-07-04 DIAGNOSIS — E871 Hypo-osmolality and hyponatremia: Secondary | ICD-10-CM | POA: Diagnosis not present

## 2021-07-06 DIAGNOSIS — W19XXXD Unspecified fall, subsequent encounter: Secondary | ICD-10-CM | POA: Diagnosis not present

## 2021-07-06 DIAGNOSIS — E871 Hypo-osmolality and hyponatremia: Secondary | ICD-10-CM | POA: Diagnosis not present

## 2021-07-06 DIAGNOSIS — D62 Acute posthemorrhagic anemia: Secondary | ICD-10-CM | POA: Diagnosis not present

## 2021-07-06 DIAGNOSIS — E876 Hypokalemia: Secondary | ICD-10-CM | POA: Diagnosis not present

## 2021-07-06 DIAGNOSIS — I10 Essential (primary) hypertension: Secondary | ICD-10-CM | POA: Diagnosis not present

## 2021-07-06 DIAGNOSIS — S7222XD Displaced subtrochanteric fracture of left femur, subsequent encounter for closed fracture with routine healing: Secondary | ICD-10-CM | POA: Diagnosis not present

## 2021-07-07 DIAGNOSIS — I10 Essential (primary) hypertension: Secondary | ICD-10-CM | POA: Diagnosis not present

## 2021-07-07 DIAGNOSIS — D62 Acute posthemorrhagic anemia: Secondary | ICD-10-CM | POA: Diagnosis not present

## 2021-07-07 DIAGNOSIS — E871 Hypo-osmolality and hyponatremia: Secondary | ICD-10-CM | POA: Diagnosis not present

## 2021-07-07 DIAGNOSIS — W19XXXD Unspecified fall, subsequent encounter: Secondary | ICD-10-CM | POA: Diagnosis not present

## 2021-07-07 DIAGNOSIS — E876 Hypokalemia: Secondary | ICD-10-CM | POA: Diagnosis not present

## 2021-07-07 DIAGNOSIS — S7222XD Displaced subtrochanteric fracture of left femur, subsequent encounter for closed fracture with routine healing: Secondary | ICD-10-CM | POA: Diagnosis not present

## 2021-07-11 DIAGNOSIS — E876 Hypokalemia: Secondary | ICD-10-CM | POA: Diagnosis not present

## 2021-07-11 DIAGNOSIS — D62 Acute posthemorrhagic anemia: Secondary | ICD-10-CM | POA: Diagnosis not present

## 2021-07-11 DIAGNOSIS — W19XXXD Unspecified fall, subsequent encounter: Secondary | ICD-10-CM | POA: Diagnosis not present

## 2021-07-11 DIAGNOSIS — S7222XD Displaced subtrochanteric fracture of left femur, subsequent encounter for closed fracture with routine healing: Secondary | ICD-10-CM | POA: Diagnosis not present

## 2021-07-11 DIAGNOSIS — E871 Hypo-osmolality and hyponatremia: Secondary | ICD-10-CM | POA: Diagnosis not present

## 2021-07-11 DIAGNOSIS — I10 Essential (primary) hypertension: Secondary | ICD-10-CM | POA: Diagnosis not present

## 2021-07-13 DIAGNOSIS — W19XXXD Unspecified fall, subsequent encounter: Secondary | ICD-10-CM | POA: Diagnosis not present

## 2021-07-13 DIAGNOSIS — D62 Acute posthemorrhagic anemia: Secondary | ICD-10-CM | POA: Diagnosis not present

## 2021-07-13 DIAGNOSIS — E871 Hypo-osmolality and hyponatremia: Secondary | ICD-10-CM | POA: Diagnosis not present

## 2021-07-13 DIAGNOSIS — E876 Hypokalemia: Secondary | ICD-10-CM | POA: Diagnosis not present

## 2021-07-13 DIAGNOSIS — I10 Essential (primary) hypertension: Secondary | ICD-10-CM | POA: Diagnosis not present

## 2021-07-13 DIAGNOSIS — S7222XD Displaced subtrochanteric fracture of left femur, subsequent encounter for closed fracture with routine healing: Secondary | ICD-10-CM | POA: Diagnosis not present

## 2021-07-14 DIAGNOSIS — E871 Hypo-osmolality and hyponatremia: Secondary | ICD-10-CM | POA: Diagnosis not present

## 2021-07-14 DIAGNOSIS — S7222XD Displaced subtrochanteric fracture of left femur, subsequent encounter for closed fracture with routine healing: Secondary | ICD-10-CM | POA: Diagnosis not present

## 2021-07-14 DIAGNOSIS — E876 Hypokalemia: Secondary | ICD-10-CM | POA: Diagnosis not present

## 2021-07-14 DIAGNOSIS — W19XXXD Unspecified fall, subsequent encounter: Secondary | ICD-10-CM | POA: Diagnosis not present

## 2021-07-14 DIAGNOSIS — D62 Acute posthemorrhagic anemia: Secondary | ICD-10-CM | POA: Diagnosis not present

## 2021-07-14 DIAGNOSIS — I10 Essential (primary) hypertension: Secondary | ICD-10-CM | POA: Diagnosis not present

## 2021-07-19 DIAGNOSIS — S7222XD Displaced subtrochanteric fracture of left femur, subsequent encounter for closed fracture with routine healing: Secondary | ICD-10-CM | POA: Diagnosis not present

## 2021-07-19 DIAGNOSIS — E871 Hypo-osmolality and hyponatremia: Secondary | ICD-10-CM | POA: Diagnosis not present

## 2021-07-19 DIAGNOSIS — W19XXXD Unspecified fall, subsequent encounter: Secondary | ICD-10-CM | POA: Diagnosis not present

## 2021-07-19 DIAGNOSIS — D62 Acute posthemorrhagic anemia: Secondary | ICD-10-CM | POA: Diagnosis not present

## 2021-07-19 DIAGNOSIS — I10 Essential (primary) hypertension: Secondary | ICD-10-CM | POA: Diagnosis not present

## 2021-07-19 DIAGNOSIS — E876 Hypokalemia: Secondary | ICD-10-CM | POA: Diagnosis not present

## 2021-07-20 DIAGNOSIS — W19XXXD Unspecified fall, subsequent encounter: Secondary | ICD-10-CM | POA: Diagnosis not present

## 2021-07-20 DIAGNOSIS — I10 Essential (primary) hypertension: Secondary | ICD-10-CM | POA: Diagnosis not present

## 2021-07-20 DIAGNOSIS — S7222XD Displaced subtrochanteric fracture of left femur, subsequent encounter for closed fracture with routine healing: Secondary | ICD-10-CM | POA: Diagnosis not present

## 2021-07-20 DIAGNOSIS — D62 Acute posthemorrhagic anemia: Secondary | ICD-10-CM | POA: Diagnosis not present

## 2021-07-20 DIAGNOSIS — E876 Hypokalemia: Secondary | ICD-10-CM | POA: Diagnosis not present

## 2021-07-20 DIAGNOSIS — E871 Hypo-osmolality and hyponatremia: Secondary | ICD-10-CM | POA: Diagnosis not present

## 2021-07-21 DIAGNOSIS — I10 Essential (primary) hypertension: Secondary | ICD-10-CM | POA: Diagnosis not present

## 2021-07-21 DIAGNOSIS — W19XXXD Unspecified fall, subsequent encounter: Secondary | ICD-10-CM | POA: Diagnosis not present

## 2021-07-21 DIAGNOSIS — D62 Acute posthemorrhagic anemia: Secondary | ICD-10-CM | POA: Diagnosis not present

## 2021-07-21 DIAGNOSIS — E871 Hypo-osmolality and hyponatremia: Secondary | ICD-10-CM | POA: Diagnosis not present

## 2021-07-21 DIAGNOSIS — E876 Hypokalemia: Secondary | ICD-10-CM | POA: Diagnosis not present

## 2021-07-21 DIAGNOSIS — S7222XD Displaced subtrochanteric fracture of left femur, subsequent encounter for closed fracture with routine healing: Secondary | ICD-10-CM | POA: Diagnosis not present

## 2021-07-26 DIAGNOSIS — E876 Hypokalemia: Secondary | ICD-10-CM | POA: Diagnosis not present

## 2021-07-26 DIAGNOSIS — S7222XD Displaced subtrochanteric fracture of left femur, subsequent encounter for closed fracture with routine healing: Secondary | ICD-10-CM | POA: Diagnosis not present

## 2021-07-26 DIAGNOSIS — D62 Acute posthemorrhagic anemia: Secondary | ICD-10-CM | POA: Diagnosis not present

## 2021-07-26 DIAGNOSIS — E871 Hypo-osmolality and hyponatremia: Secondary | ICD-10-CM | POA: Diagnosis not present

## 2021-07-26 DIAGNOSIS — I10 Essential (primary) hypertension: Secondary | ICD-10-CM | POA: Diagnosis not present

## 2021-07-26 DIAGNOSIS — W19XXXD Unspecified fall, subsequent encounter: Secondary | ICD-10-CM | POA: Diagnosis not present

## 2021-07-28 DIAGNOSIS — I10 Essential (primary) hypertension: Secondary | ICD-10-CM | POA: Diagnosis not present

## 2021-07-28 DIAGNOSIS — S7222XD Displaced subtrochanteric fracture of left femur, subsequent encounter for closed fracture with routine healing: Secondary | ICD-10-CM | POA: Diagnosis not present

## 2021-07-28 DIAGNOSIS — E876 Hypokalemia: Secondary | ICD-10-CM | POA: Diagnosis not present

## 2021-07-28 DIAGNOSIS — W19XXXD Unspecified fall, subsequent encounter: Secondary | ICD-10-CM | POA: Diagnosis not present

## 2021-07-28 DIAGNOSIS — D62 Acute posthemorrhagic anemia: Secondary | ICD-10-CM | POA: Diagnosis not present

## 2021-07-28 DIAGNOSIS — E871 Hypo-osmolality and hyponatremia: Secondary | ICD-10-CM | POA: Diagnosis not present

## 2021-08-03 DIAGNOSIS — S7222XG Displaced subtrochanteric fracture of left femur, subsequent encounter for closed fracture with delayed healing: Secondary | ICD-10-CM | POA: Diagnosis not present

## 2021-09-14 DIAGNOSIS — S7222XK Displaced subtrochanteric fracture of left femur, subsequent encounter for closed fracture with nonunion: Secondary | ICD-10-CM | POA: Diagnosis not present

## 2021-09-14 DIAGNOSIS — M1712 Unilateral primary osteoarthritis, left knee: Secondary | ICD-10-CM | POA: Diagnosis not present

## 2021-10-27 DIAGNOSIS — Z0000 Encounter for general adult medical examination without abnormal findings: Secondary | ICD-10-CM | POA: Diagnosis not present

## 2021-10-27 DIAGNOSIS — Z1331 Encounter for screening for depression: Secondary | ICD-10-CM | POA: Diagnosis not present

## 2021-10-27 DIAGNOSIS — Z9181 History of falling: Secondary | ICD-10-CM | POA: Diagnosis not present

## 2021-10-27 DIAGNOSIS — E669 Obesity, unspecified: Secondary | ICD-10-CM | POA: Diagnosis not present

## 2021-10-27 DIAGNOSIS — E785 Hyperlipidemia, unspecified: Secondary | ICD-10-CM | POA: Diagnosis not present

## 2022-01-27 DIAGNOSIS — S7222XK Displaced subtrochanteric fracture of left femur, subsequent encounter for closed fracture with nonunion: Secondary | ICD-10-CM | POA: Diagnosis not present

## 2022-02-27 DIAGNOSIS — S7222XK Displaced subtrochanteric fracture of left femur, subsequent encounter for closed fracture with nonunion: Secondary | ICD-10-CM | POA: Diagnosis not present

## 2022-03-23 IMAGING — XA DG FEMUR 2+V*L*
7 series · 7 of 7 positions shown · non-contrast
Comparison: Hip radiograph 05/10/2021

CLINICAL DATA: Left femur surgery

EXAM:
LEFT FEMUR 2 VIEWS

[Series 1: ortho standard · 1 of 1 slices shown (1 of 7)]
[im 1/1]
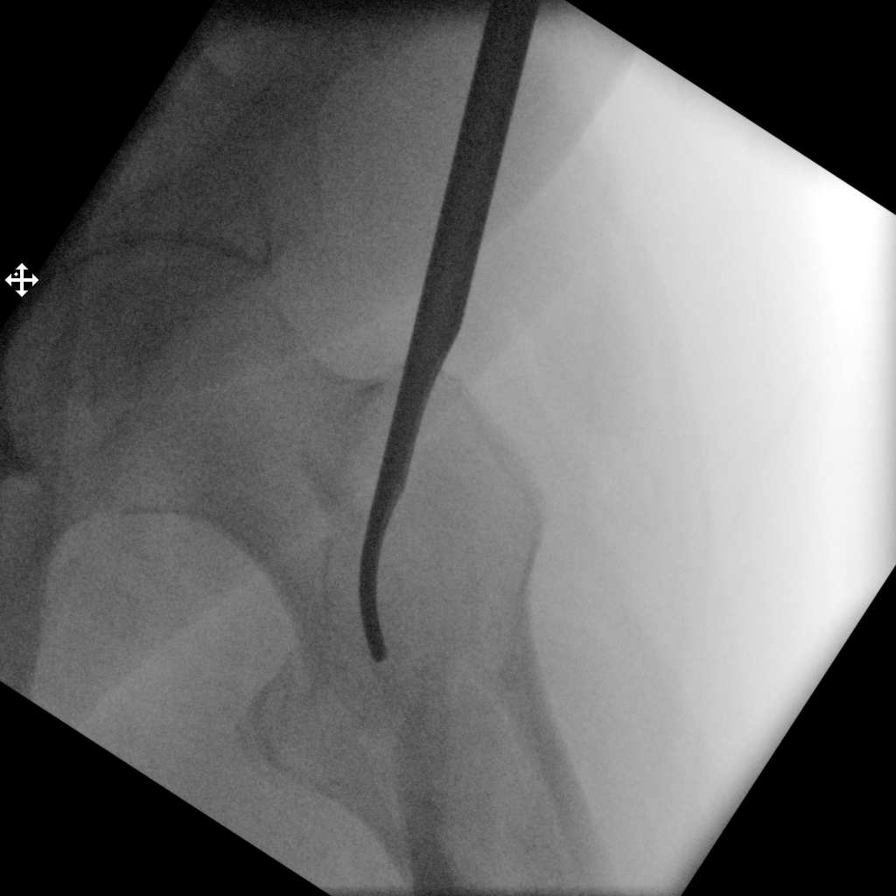

[Series 2: ortho standard · 1 of 1 slices shown (2 of 7)]
[im 1/1]
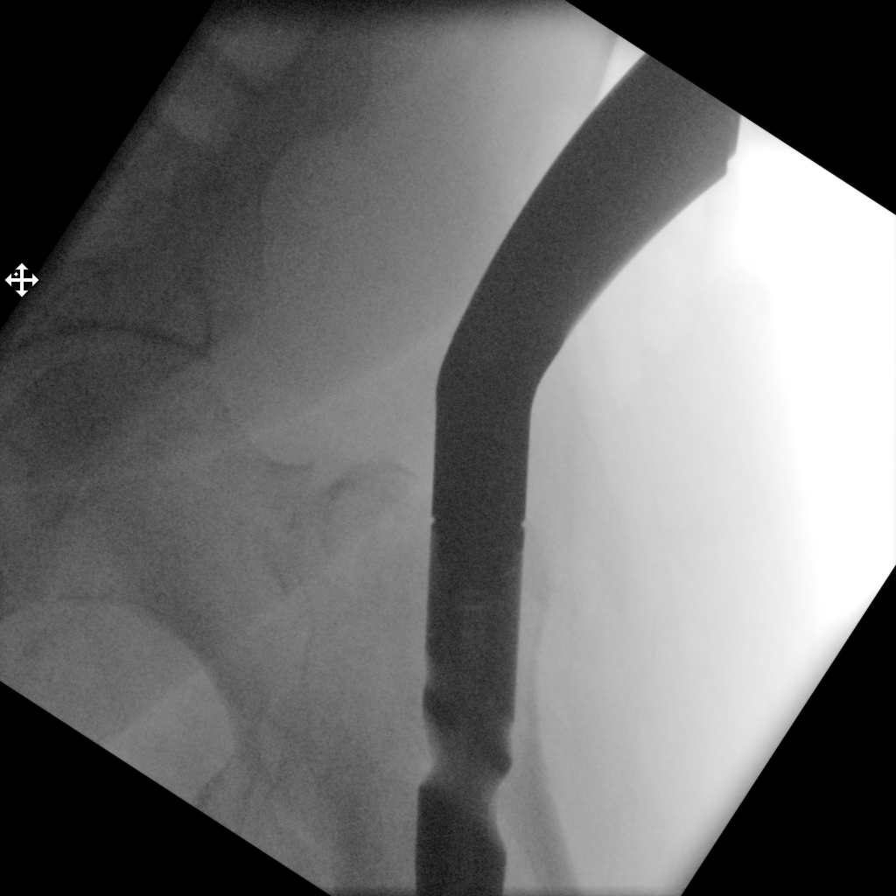

[Series 3: ortho standard · 1 of 1 slices shown (3 of 7)]
[im 1/1]
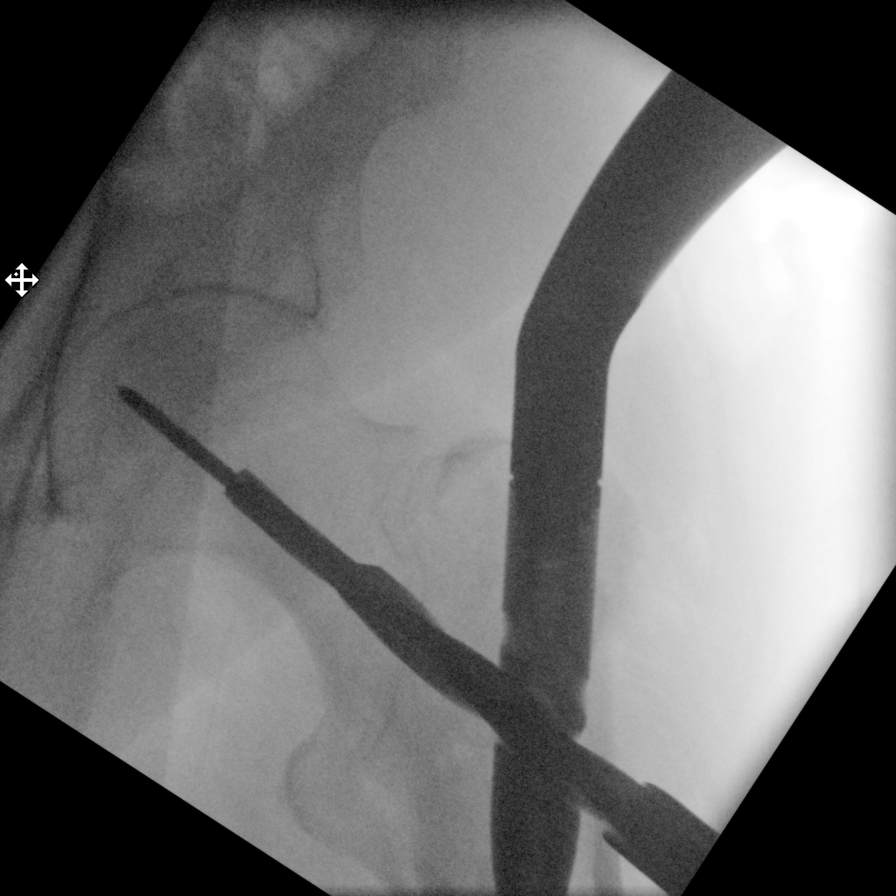

[Series 4: ortho standard · 1 of 1 slices shown (4 of 7)]
[im 1/1]
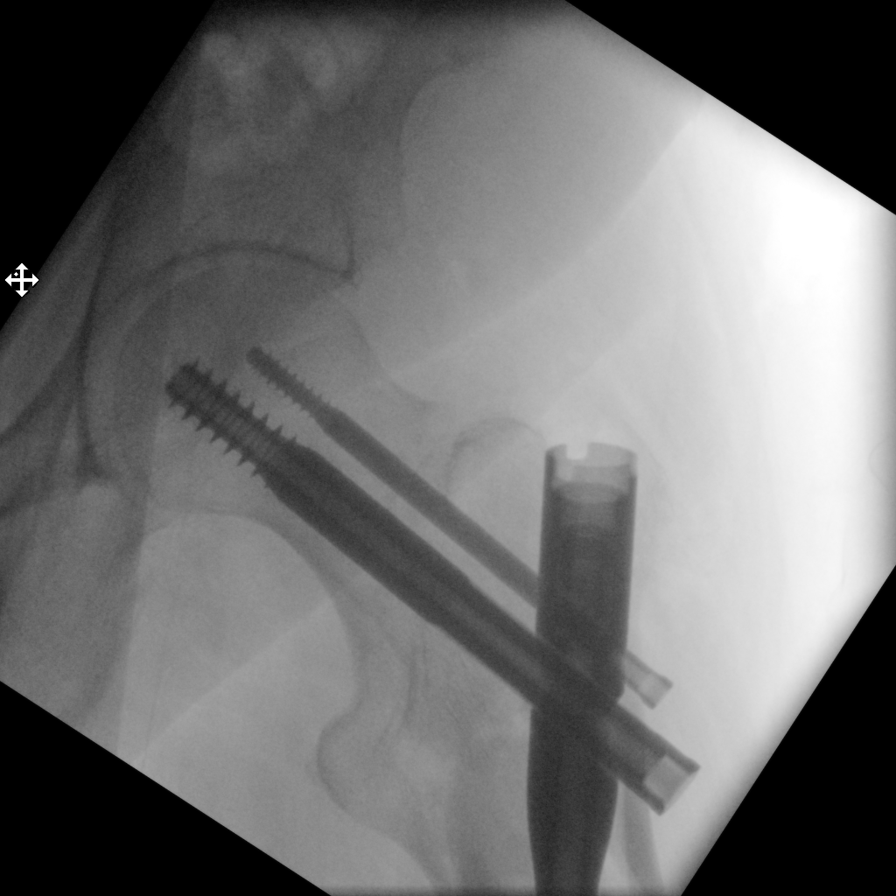

[Series 5: ortho standard · 1 of 1 slices shown (5 of 7)]
[im 1/1]
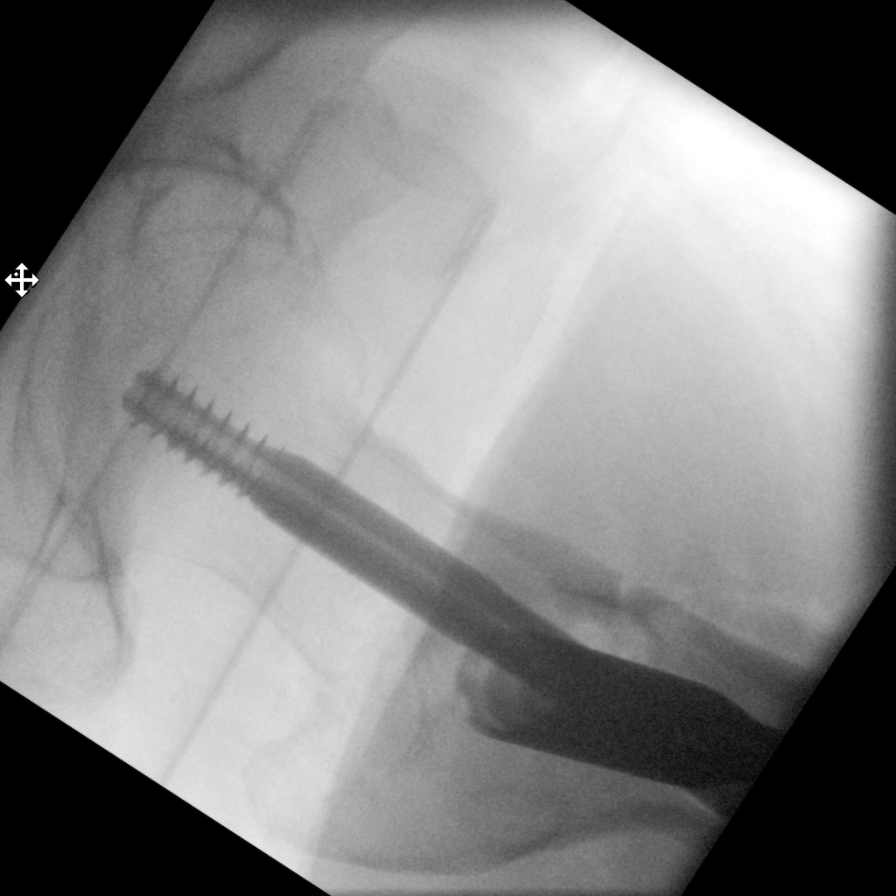

[Series 6: ortho standard · 1 of 1 slices shown (6 of 7)]
[im 1/1]
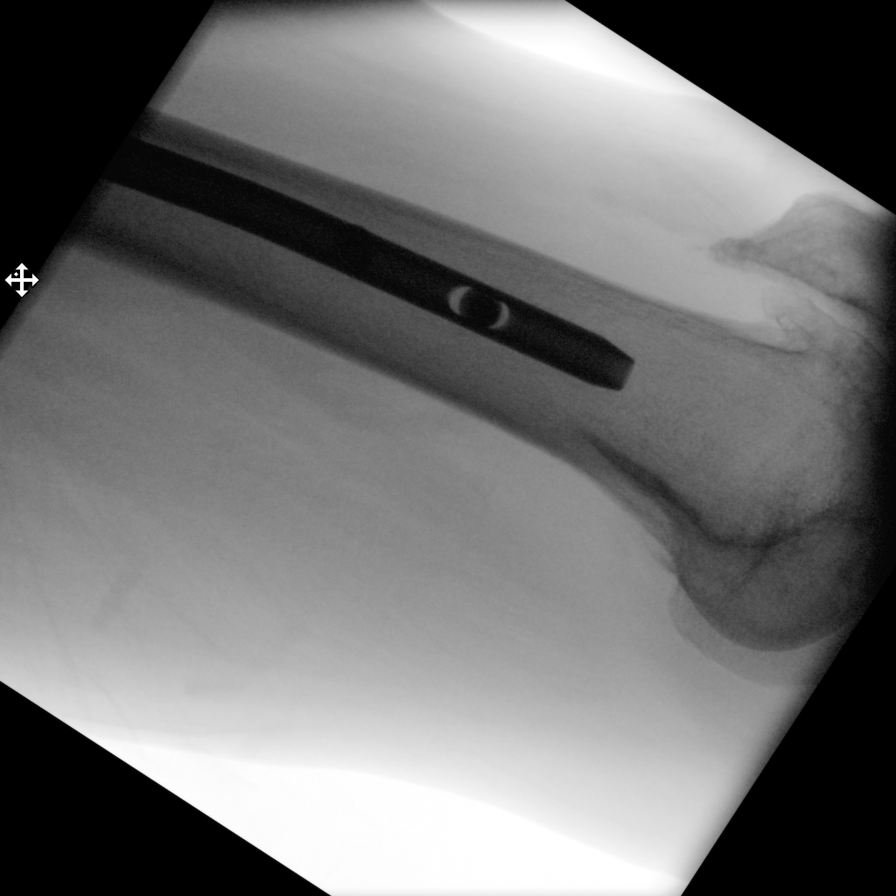

[Series 7: ortho standard · 1 of 1 slices shown (7 of 7)]
[im 1/1]
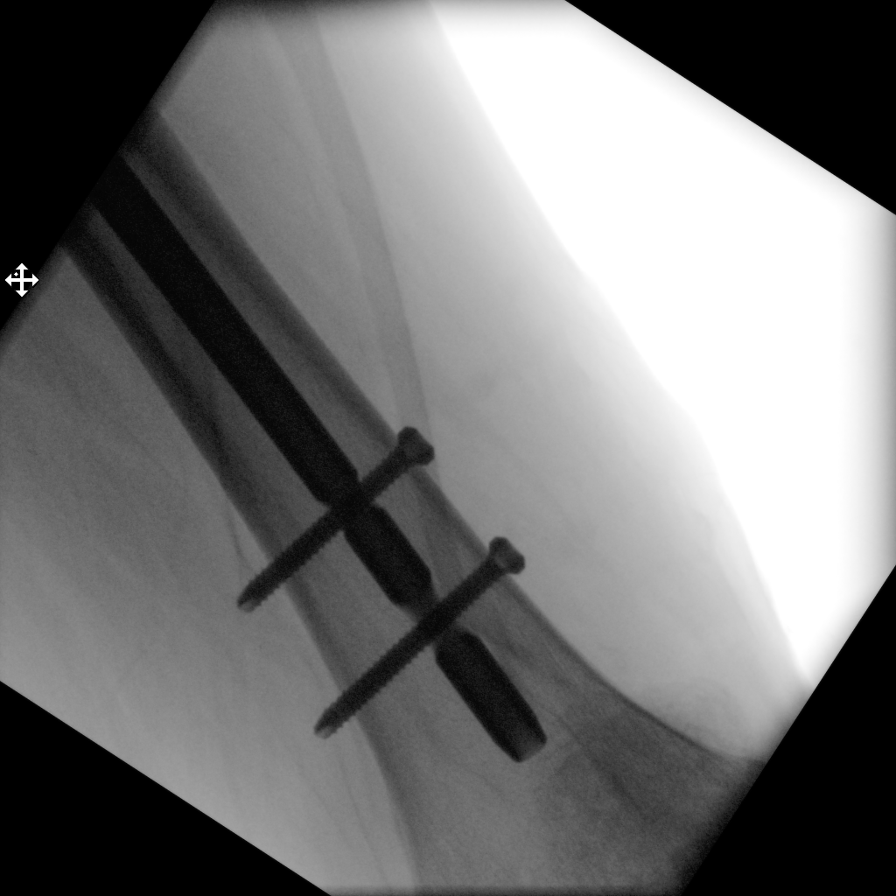

[7 of 7 positions shown; findings below may reference images not displayed]

FINDINGS: Intraoperative images during left femur intramedullary narrowing and
intertrochanteric fracture fixation. Intact hardware without
evidence of immediate complication.
IMPRESSION: Intraoperative images during intertrochanteric fracture fixation
with intramedullary nailing. No evidence of immediate complication.

## 2022-03-23 IMAGING — DX DG FEMUR 2+V*L*
5 series · 5 of 5 positions shown · non-contrast
Comparison: None.

CLINICAL DATA: Status post left femur intramedullary nail

EXAM:
LEFT FEMUR 2 VIEWS

[femur ap (1 of 2)]
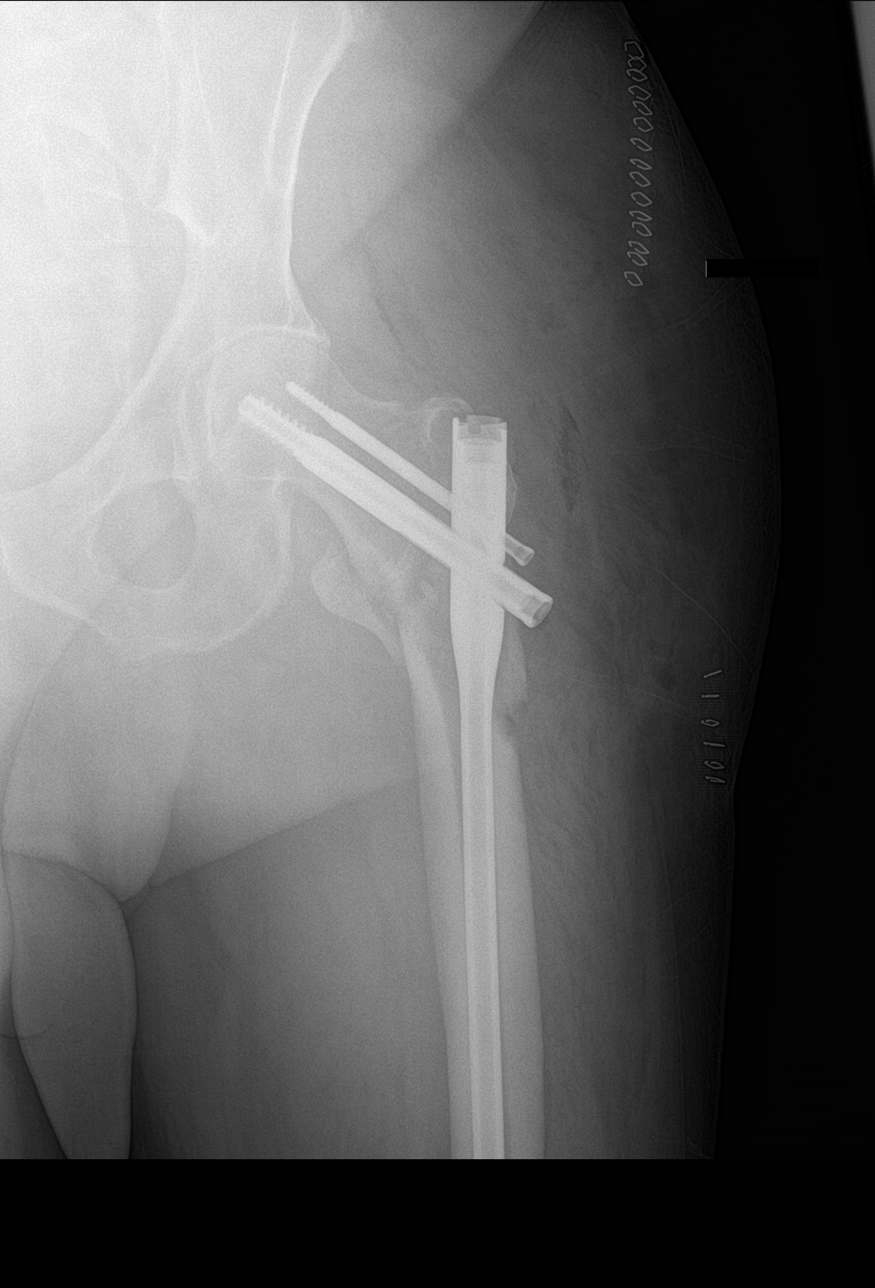

[femur ap (2 of 2)]
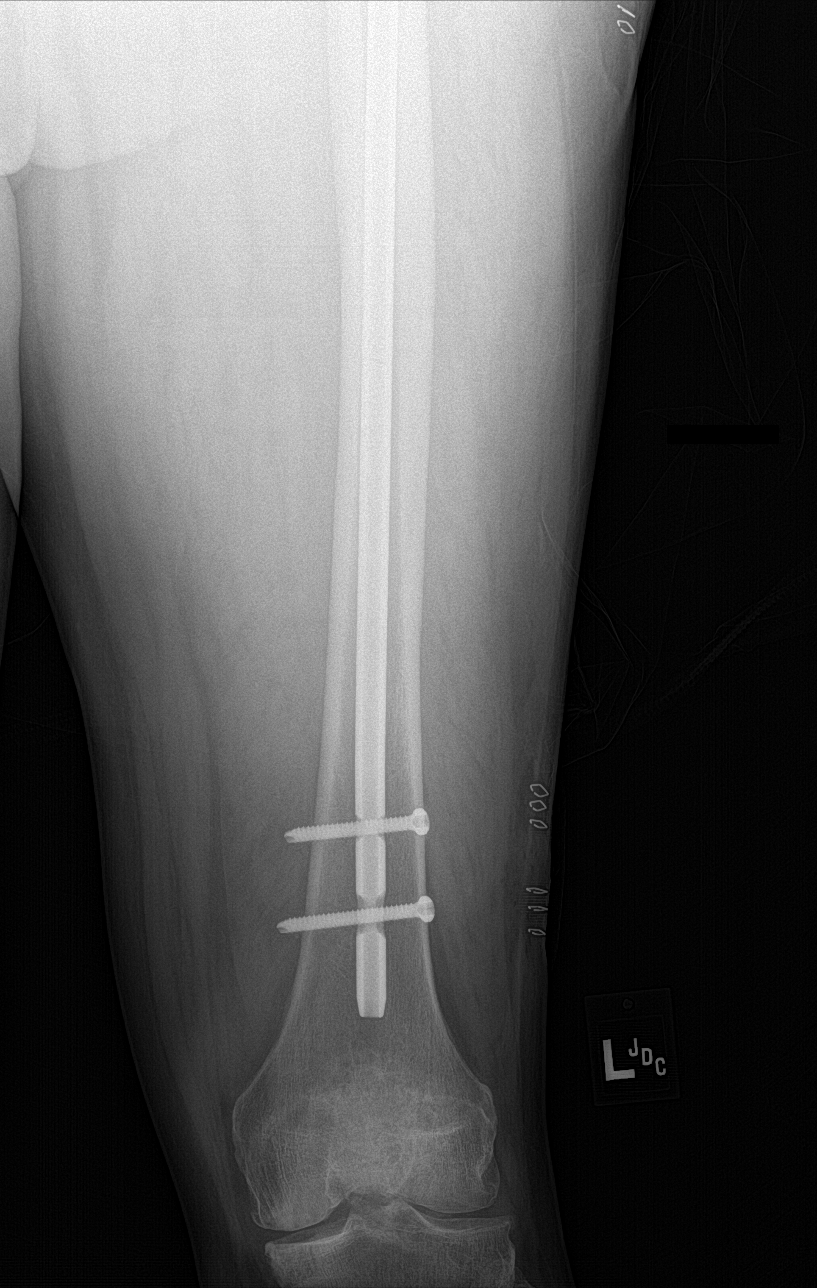

[femur lat (1 of 3)]
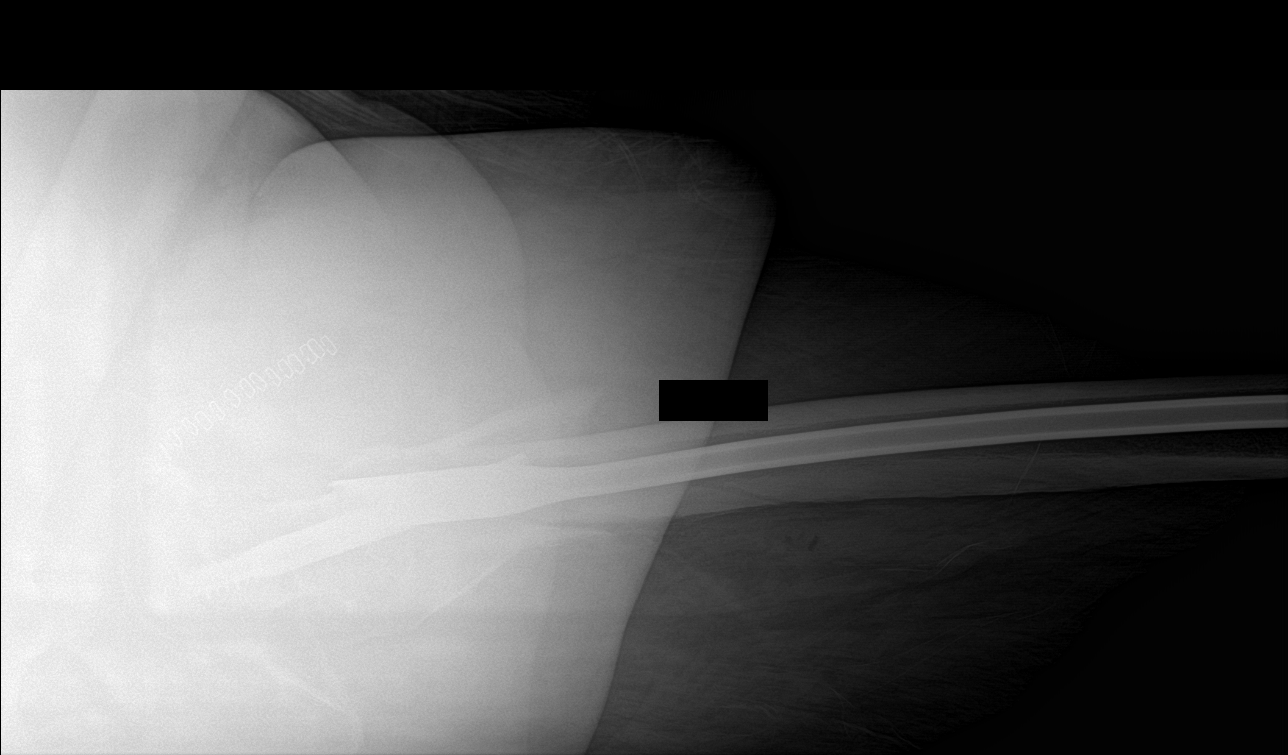

[femur lat (2 of 3)]
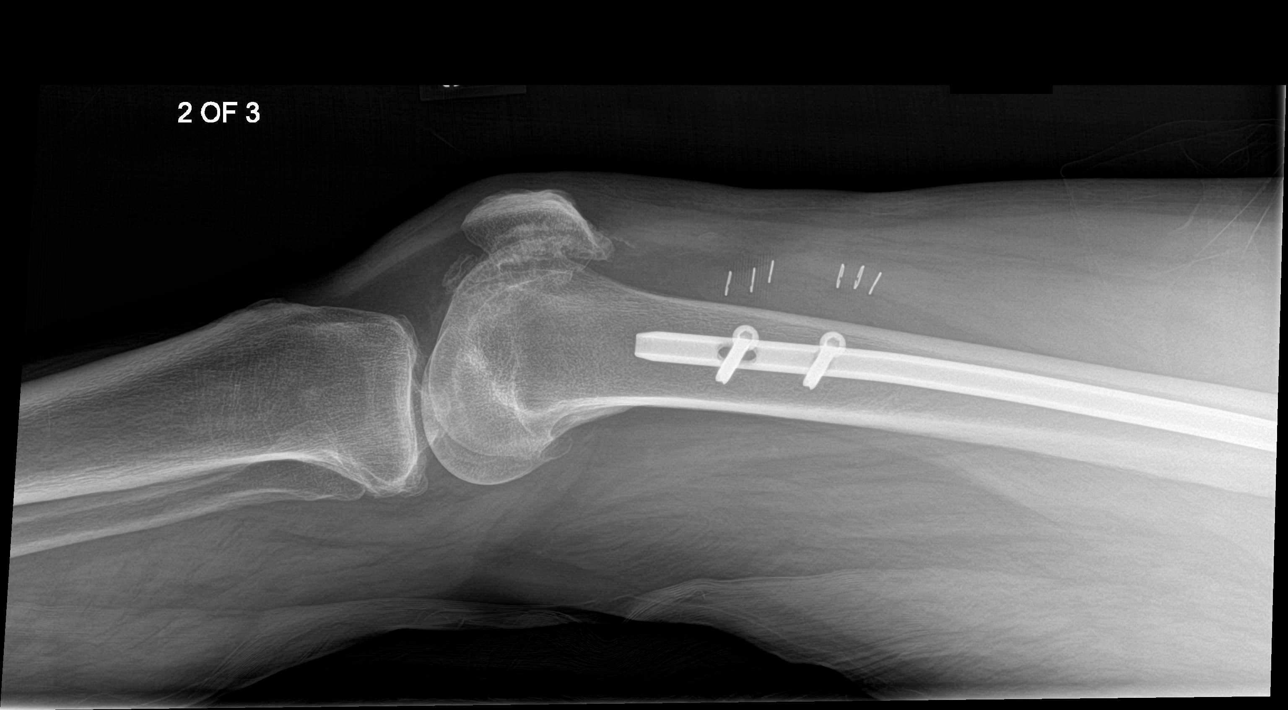

[femur lat (3 of 3)]
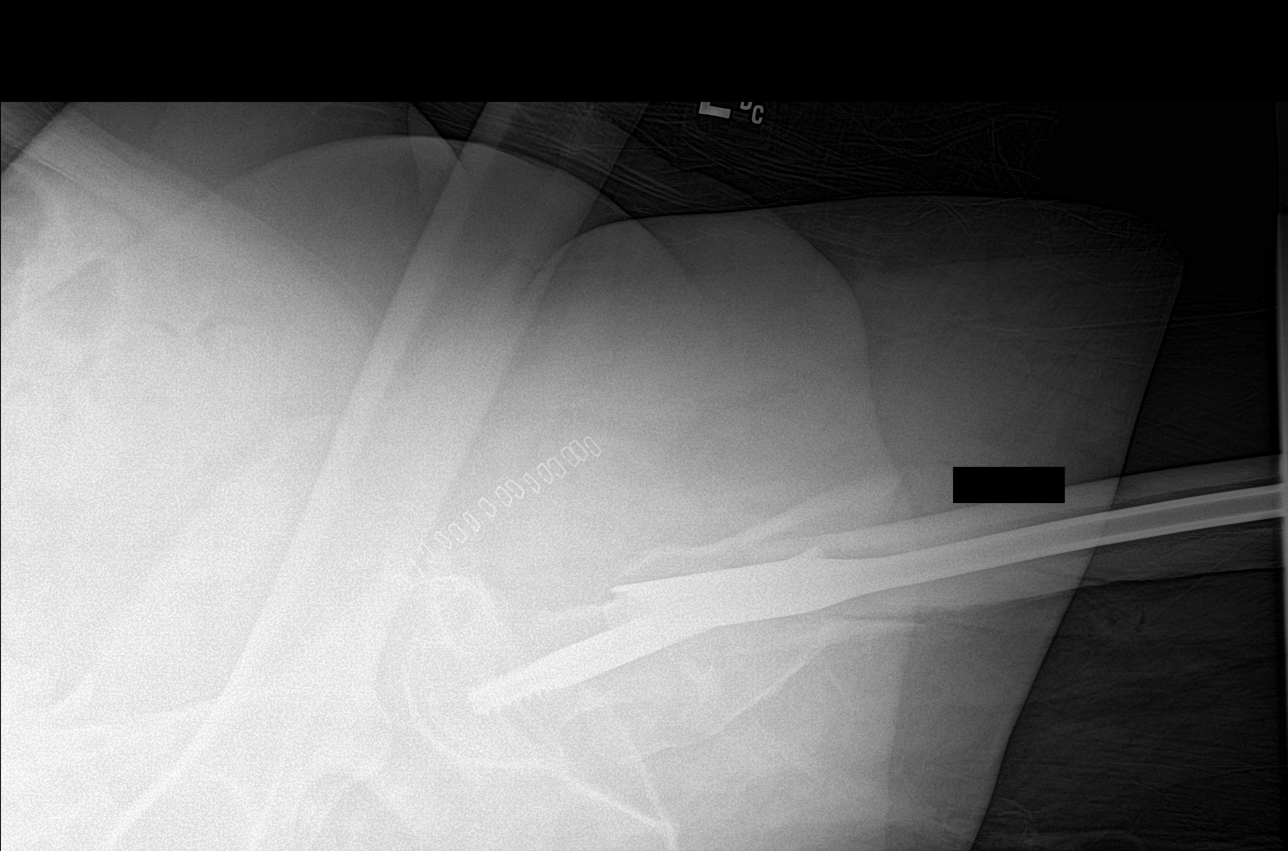

[5 of 5 positions shown; findings below may reference images not displayed]

FINDINGS: Postsurgical changes of left femur intramedullary nailing for an
intertrochanteric fracture. Improved alignment. Expected soft tissue
changes.
IMPRESSION: Postsurgical changes of left femur intramedullary nailing for an
intertrochanteric fracture. Improved alignment. Expected soft tissue
changes.

## 2022-03-29 DIAGNOSIS — M545 Low back pain, unspecified: Secondary | ICD-10-CM | POA: Diagnosis not present

## 2022-03-29 DIAGNOSIS — Z125 Encounter for screening for malignant neoplasm of prostate: Secondary | ICD-10-CM | POA: Diagnosis not present

## 2022-03-29 DIAGNOSIS — E871 Hypo-osmolality and hyponatremia: Secondary | ICD-10-CM | POA: Diagnosis not present

## 2022-03-29 DIAGNOSIS — E782 Mixed hyperlipidemia: Secondary | ICD-10-CM | POA: Diagnosis not present

## 2022-03-29 DIAGNOSIS — Z6830 Body mass index (BMI) 30.0-30.9, adult: Secondary | ICD-10-CM | POA: Diagnosis not present

## 2022-03-29 DIAGNOSIS — I10 Essential (primary) hypertension: Secondary | ICD-10-CM | POA: Diagnosis not present

## 2022-05-17 ENCOUNTER — Other Ambulatory Visit: Payer: Self-pay | Admitting: Student

## 2022-05-17 DIAGNOSIS — S7222XK Displaced subtrochanteric fracture of left femur, subsequent encounter for closed fracture with nonunion: Secondary | ICD-10-CM | POA: Diagnosis not present

## 2022-05-22 ENCOUNTER — Ambulatory Visit
Admission: RE | Admit: 2022-05-22 | Discharge: 2022-05-22 | Disposition: A | Discharge status: Home / Self Care | Payer: Medicare Other | Source: Ambulatory Visit | Attending: Student | Admitting: Student

## 2022-05-22 DIAGNOSIS — M1612 Unilateral primary osteoarthritis, left hip: Secondary | ICD-10-CM | POA: Diagnosis not present

## 2022-05-22 DIAGNOSIS — S7222XK Displaced subtrochanteric fracture of left femur, subsequent encounter for closed fracture with nonunion: Secondary | ICD-10-CM | POA: Insufficient documentation

## 2022-05-22 DIAGNOSIS — S7222XA Displaced subtrochanteric fracture of left femur, initial encounter for closed fracture: Secondary | ICD-10-CM | POA: Diagnosis not present

## 2022-05-31 DIAGNOSIS — S7222XK Displaced subtrochanteric fracture of left femur, subsequent encounter for closed fracture with nonunion: Secondary | ICD-10-CM | POA: Diagnosis not present

## 2022-06-01 DIAGNOSIS — Z23 Encounter for immunization: Secondary | ICD-10-CM | POA: Diagnosis not present

## 2022-06-01 DIAGNOSIS — E871 Hypo-osmolality and hyponatremia: Secondary | ICD-10-CM | POA: Diagnosis not present

## 2022-06-05 ENCOUNTER — Other Ambulatory Visit: Payer: Self-pay

## 2022-06-05 ENCOUNTER — Encounter (HOSPITAL_COMMUNITY): Payer: Self-pay | Admitting: Orthopedic Surgery

## 2022-06-05 NOTE — Progress Notes (Signed)
PCP - Sharman Crate Hamrick with [facility] Cardiologist - Denies EKG - Day of surgery Chest x-ray - NI ECHO - Denies Cardiac Cath - Denies CPAP - Denies DM Denies Blood Thinner Instructions: Per patient last aspirin dose on Saturday 06/03/22 ERAS Protcol - Clears until 0430 on 14th COVID TEST- NI  Anesthesia review: NI  -------------  SDW INSTRUCTIONS:  Your procedure is scheduled on Tuesday 06/06/22 @ 5:30. Please report to [facility] Main Entrance "A" at 530 A.M., and check in at the Admitting office. Call this number if you have problems the morning of surgery: [phone]   Remember: Do not eat anything after midnight the night before your surgery  You may drink clear liquids until 430 the morning of your surgery.   Clear liquids allowed are: Water, Non-Citrus Juices (without pulp), Carbonated Beverages, Clear Tea, Black Coffee Only, and Gatorade   Medications to take morning of surgery with a sip of water include: Metoprolol IF needed: Vicodin or Flexeril  As of today, STOP taking any Voltaren, Aspirin (unless otherwise instructed by your surgeon), Aleve, Naproxen, Ibuprofen, Motrin, Advil, Goody's, BC's, all herbal medications, fish oil, and all vitamins.    The Morning of Surgery Do not wear jewelry, make-up or nail polish. Do not wear lotions, powders, or perfumes/colognes, or deodorant Do not bring valuables to the hospital. [facility] is not responsible for any belongings or valuables.  If you are a smoker, DO NOT Smoke 24 hours prior to surgery  If you wear a CPAP at night please bring your mask the morning of surgery   Remember that you must have someone to transport you home after your surgery, and remain with you for 24 hours if you are discharged the same day.  Please bring cases for contacts, glasses, hearing aids, dentures or bridgework because it cannot be worn into surgery.   Patients discharged the day of surgery will not be allowed to  drive home.   Please shower the NIGHT BEFORE/MORNING OF SURGERY (use antibacterial soap like DIAL soap if possible). Wear comfortable clothes the morning of surgery. Oral Hygiene is also important to reduce your risk of infection.  Remember - BRUSH YOUR TEETH THE MORNING OF SURGERY WITH YOUR REGULAR TOOTHPASTE  Patient denies shortness of breath, fever, cough and chest pain.

## 2022-06-06 ENCOUNTER — Other Ambulatory Visit: Payer: Self-pay

## 2022-06-06 ENCOUNTER — Inpatient Hospital Stay (HOSPITAL_COMMUNITY)
Admission: RE | Admit: 2022-06-06 | Discharge: 2022-06-10 | DRG: 481 | Disposition: A | Discharge status: Home Health | Payer: Medicare Other | Attending: Orthopedic Surgery | Admitting: Orthopedic Surgery

## 2022-06-06 ENCOUNTER — Inpatient Hospital Stay (HOSPITAL_COMMUNITY): Payer: Medicare Other | Admitting: Anesthesiology

## 2022-06-06 ENCOUNTER — Encounter (HOSPITAL_COMMUNITY): Payer: Self-pay | Admitting: Orthopedic Surgery

## 2022-06-06 ENCOUNTER — Inpatient Hospital Stay (HOSPITAL_COMMUNITY): Payer: Medicare Other

## 2022-06-06 ENCOUNTER — Encounter (HOSPITAL_COMMUNITY)
Admission: RE | Disposition: A | Discharge status: Home Health | Payer: Self-pay | Source: Home / Self Care | Attending: Orthopedic Surgery

## 2022-06-06 DIAGNOSIS — D62 Acute posthemorrhagic anemia: Secondary | ICD-10-CM | POA: Diagnosis not present

## 2022-06-06 DIAGNOSIS — M80059A Age-related osteoporosis with current pathological fracture, unspecified femur, initial encounter for fracture: Secondary | ICD-10-CM

## 2022-06-06 DIAGNOSIS — S72002K Fracture of unspecified part of neck of left femur, subsequent encounter for closed fracture with nonunion: Principal | ICD-10-CM

## 2022-06-06 DIAGNOSIS — E871 Hypo-osmolality and hyponatremia: Secondary | ICD-10-CM | POA: Diagnosis present

## 2022-06-06 DIAGNOSIS — T84125A Displacement of internal fixation device of left femur, initial encounter: Principal | ICD-10-CM | POA: Diagnosis present

## 2022-06-06 DIAGNOSIS — S72142K Displaced intertrochanteric fracture of left femur, subsequent encounter for closed fracture with nonunion: Secondary | ICD-10-CM | POA: Diagnosis not present

## 2022-06-06 DIAGNOSIS — S72352K Displaced comminuted fracture of shaft of left femur, subsequent encounter for closed fracture with nonunion: Secondary | ICD-10-CM | POA: Diagnosis not present

## 2022-06-06 DIAGNOSIS — J9811 Atelectasis: Secondary | ICD-10-CM | POA: Diagnosis not present

## 2022-06-06 DIAGNOSIS — E785 Hyperlipidemia, unspecified: Secondary | ICD-10-CM | POA: Diagnosis present

## 2022-06-06 DIAGNOSIS — I10 Essential (primary) hypertension: Secondary | ICD-10-CM | POA: Diagnosis present

## 2022-06-06 DIAGNOSIS — S72052K Unspecified fracture of head of left femur, subsequent encounter for closed fracture with nonunion: Secondary | ICD-10-CM | POA: Diagnosis not present

## 2022-06-06 DIAGNOSIS — Z87310 Personal history of (healed) osteoporosis fracture: Secondary | ICD-10-CM

## 2022-06-06 DIAGNOSIS — S7292XA Unspecified fracture of left femur, initial encounter for closed fracture: Secondary | ICD-10-CM | POA: Diagnosis not present

## 2022-06-06 DIAGNOSIS — Y792 Prosthetic and other implants, materials and accessory orthopedic devices associated with adverse incidents: Secondary | ICD-10-CM | POA: Diagnosis present

## 2022-06-06 DIAGNOSIS — F17200 Nicotine dependence, unspecified, uncomplicated: Secondary | ICD-10-CM | POA: Diagnosis present

## 2022-06-06 DIAGNOSIS — Z01818 Encounter for other preprocedural examination: Secondary | ICD-10-CM | POA: Diagnosis not present

## 2022-06-06 DIAGNOSIS — M84750K Atypical femoral fracture, unspecified, subsequent encounter for fracture with nonunion: Secondary | ICD-10-CM

## 2022-06-06 DIAGNOSIS — Z79891 Long term (current) use of opiate analgesic: Secondary | ICD-10-CM

## 2022-06-06 DIAGNOSIS — Z4789 Encounter for other orthopedic aftercare: Secondary | ICD-10-CM | POA: Diagnosis not present

## 2022-06-06 DIAGNOSIS — Z8249 Family history of ischemic heart disease and other diseases of the circulatory system: Secondary | ICD-10-CM

## 2022-06-06 DIAGNOSIS — M65852 Other synovitis and tenosynovitis, left thigh: Secondary | ICD-10-CM | POA: Diagnosis not present

## 2022-06-06 HISTORY — PX: ORIF FEMUR FRACTURE: SHX2119

## 2022-06-06 HISTORY — PX: HARDWARE REMOVAL: SHX979

## 2022-06-06 HISTORY — DX: Nicotine dependence, unspecified, uncomplicated: F17.200

## 2022-06-06 LAB — CBC WITH DIFFERENTIAL/PLATELET
Abs Immature Granulocytes: 0.02 10*3/uL (ref 0.00–0.07)
Basophils Absolute: 0 10*3/uL (ref 0.0–0.1)
Basophils Relative: 0 %
Eosinophils Absolute: 0.2 10*3/uL (ref 0.0–0.5)
Eosinophils Relative: 4 %
HCT: 32.1 % — ABNORMAL LOW (ref 39.0–52.0)
Hemoglobin: 11.2 g/dL — ABNORMAL LOW (ref 13.0–17.0)
Immature Granulocytes: 0 %
Lymphocytes Relative: 17 %
Lymphs Abs: 1 10*3/uL (ref 0.7–4.0)
MCH: 32.3 pg (ref 26.0–34.0)
MCHC: 34.9 g/dL (ref 30.0–36.0)
MCV: 92.5 fL (ref 80.0–100.0)
Monocytes Absolute: 0.3 10*3/uL (ref 0.1–1.0)
Monocytes Relative: 5 %
Neutro Abs: 4.3 10*3/uL (ref 1.7–7.7)
Neutrophils Relative %: 74 %
Platelets: 300 10*3/uL (ref 150–400)
RBC: 3.47 MIL/uL — ABNORMAL LOW (ref 4.22–5.81)
RDW: 12.2 % (ref 11.5–15.5)
WBC: 5.8 10*3/uL (ref 4.0–10.5)
nRBC: 0 % (ref 0.0–0.2)

## 2022-06-06 LAB — COMPREHENSIVE METABOLIC PANEL
ALT: 8 U/L (ref 0–44)
AST: 12 U/L — ABNORMAL LOW (ref 15–41)
Albumin: 3.1 g/dL — ABNORMAL LOW (ref 3.5–5.0)
Alkaline Phosphatase: 108 U/L (ref 38–126)
Anion gap: 11 (ref 5–15)
BUN: 5 mg/dL — ABNORMAL LOW (ref 8–23)
CO2: 27 mmol/L (ref 22–32)
Calcium: 8.5 mg/dL — ABNORMAL LOW (ref 8.9–10.3)
Chloride: 92 mmol/L — ABNORMAL LOW (ref 98–111)
Creatinine, Ser: 0.87 mg/dL (ref 0.61–1.24)
GFR, Estimated: >60 mL/min (ref >60)
Glucose, Bld: 112 mg/dL — ABNORMAL HIGH (ref 70–99)
Potassium: 3.1 mmol/L — ABNORMAL LOW (ref 3.5–5.1)
Sodium: 130 mmol/L — ABNORMAL LOW (ref 135–145)
Total Bilirubin: 0.4 mg/dL (ref 0.3–1.2)
Total Protein: 6.7 g/dL (ref 6.5–8.1)

## 2022-06-06 LAB — VITAMIN D 25 HYDROXY (VIT D DEFICIENCY, FRACTURES): Vit D, 25-Hydroxy: 40.77 ng/mL (ref 30–100)

## 2022-06-06 LAB — SURGICAL PCR SCREEN
MRSA, PCR: NEGATIVE
Staphylococcus aureus: POSITIVE — AB

## 2022-06-06 LAB — C-REACTIVE PROTEIN: CRP: 5.5 mg/dL — ABNORMAL HIGH (ref <1.0)

## 2022-06-06 LAB — SEDIMENTATION RATE: Sed Rate: 40 mm/hr — ABNORMAL HIGH (ref 0–16)

## 2022-06-06 LAB — RAPID URINE DRUG SCREEN, HOSP PERFORMED
Amphetamines: NOT DETECTED
Barbiturates: NOT DETECTED
Benzodiazepines: POSITIVE — AB
Cocaine: NOT DETECTED
Opiates: POSITIVE — AB
Tetrahydrocannabinol: NOT DETECTED

## 2022-06-06 LAB — HEMOGLOBIN A1C
Hgb A1c MFr Bld: 5 % (ref 4.8–5.6)
Mean Plasma Glucose: 96.8 mg/dL

## 2022-06-06 LAB — OSMOLALITY, URINE: Osmolality, Ur: 334 mOsm/kg (ref 300–900)

## 2022-06-06 SURGERY — OPEN REDUCTION INTERNAL FIXATION FEMORAL SHAFT FRACTURE
Anesthesia: General | Site: Leg Upper | Laterality: Left

## 2022-06-06 MED ORDER — OXYCODONE HCL 5 MG PO TABS
10.0000 mg | ORAL_TABLET | ORAL | Status: DC | PRN
  Administered 2022-06-07 – 2022-06-10 (×4): 10 mg via ORAL
  Filled 2022-06-06 (×4): qty 2

## 2022-06-06 MED ORDER — FENTANYL CITRATE (PF) 250 MCG/5ML IJ SOLN
INTRAMUSCULAR | Status: DC | PRN
  Administered 2022-06-06: 50 ug via INTRAVENOUS
  Administered 2022-06-06 (×2): 75 ug via INTRAVENOUS
  Administered 2022-06-06: 50 ug via INTRAVENOUS

## 2022-06-06 MED ORDER — 0.9 % SODIUM CHLORIDE (POUR BTL) OPTIME
TOPICAL | Status: DC | PRN
  Administered 2022-06-06: 1000 mL

## 2022-06-06 MED ORDER — TRANEXAMIC ACID-NACL 1000-0.7 MG/100ML-% IV SOLN
INTRAVENOUS | Status: AC
  Filled 2022-06-06: qty 100

## 2022-06-06 MED ORDER — LACTATED RINGERS IV SOLN: INTRAVENOUS | Status: DC | PRN

## 2022-06-06 MED ORDER — BISACODYL 5 MG PO TBEC: 5.0000 mg | DELAYED_RELEASE_TABLET | Freq: Every day | ORAL | Status: DC | PRN

## 2022-06-06 MED ORDER — METHYLENE BLUE 1 % INJ SOLN
INTRAVENOUS | Status: AC
  Filled 2022-06-06: qty 10

## 2022-06-06 MED ORDER — ONDANSETRON HCL 4 MG PO TABS: 4.0000 mg | ORAL_TABLET | Freq: Four times a day (QID) | ORAL | Status: DC | PRN

## 2022-06-06 MED ORDER — ONDANSETRON HCL 4 MG/2ML IJ SOLN
INTRAMUSCULAR | Status: DC | PRN
  Administered 2022-06-06: 4 mg via INTRAVENOUS

## 2022-06-06 MED ORDER — IOHEXOL 300 MG/ML  SOLN
INTRAMUSCULAR | Status: DC | PRN
  Administered 2022-06-06: 50 mL

## 2022-06-06 MED ORDER — ORAL CARE MOUTH RINSE: 15.0000 mL | Freq: Once | OROMUCOSAL | Status: AC

## 2022-06-06 MED ORDER — ACETAMINOPHEN 500 MG PO TABS
1000.0000 mg | ORAL_TABLET | Freq: Once | ORAL | Status: AC
  Administered 2022-06-06: 1000 mg via ORAL
  Filled 2022-06-06: qty 2

## 2022-06-06 MED ORDER — MIDAZOLAM HCL 2 MG/2ML IJ SOLN
INTRAMUSCULAR | Status: AC
  Filled 2022-06-06: qty 2

## 2022-06-06 MED ORDER — POTASSIUM CHLORIDE IN NACL 20-0.9 MEQ/L-% IV SOLN
INTRAVENOUS | Status: DC
  Filled 2022-06-06 (×4): qty 1000

## 2022-06-06 MED ORDER — PHENOL 1.4 % MT LIQD: 1.0000 | OROMUCOSAL | Status: DC | PRN

## 2022-06-06 MED ORDER — THIAMINE MONONITRATE 100 MG PO TABS
100.0000 mg | ORAL_TABLET | Freq: Every day | ORAL | Status: DC
  Administered 2022-06-07 – 2022-06-10 (×4): 100 mg via ORAL
  Filled 2022-06-06 (×4): qty 1

## 2022-06-06 MED ORDER — CHLORHEXIDINE GLUCONATE 4 % EX LIQD: 60.0000 mL | Freq: Once | CUTANEOUS | Status: DC

## 2022-06-06 MED ORDER — SUGAMMADEX SODIUM 200 MG/2ML IV SOLN
INTRAVENOUS | Status: DC | PRN
  Administered 2022-06-06: 200 mg via INTRAVENOUS

## 2022-06-06 MED ORDER — CEFAZOLIN SODIUM 1 G IJ SOLR
INTRAMUSCULAR | Status: AC
  Filled 2022-06-06: qty 20

## 2022-06-06 MED ORDER — MIDAZOLAM HCL 5 MG/5ML IJ SOLN
INTRAMUSCULAR | Status: DC | PRN
  Administered 2022-06-06: 1 mg via INTRAVENOUS

## 2022-06-06 MED ORDER — ACETAMINOPHEN 325 MG PO TABS: 325.0000 mg | ORAL_TABLET | Freq: Four times a day (QID) | ORAL | Status: DC | PRN

## 2022-06-06 MED ORDER — PHENYLEPHRINE 80 MCG/ML (10ML) SYRINGE FOR IV PUSH (FOR BLOOD PRESSURE SUPPORT)
PREFILLED_SYRINGE | INTRAVENOUS | Status: AC
  Filled 2022-06-06: qty 10

## 2022-06-06 MED ORDER — SENNOSIDES-DOCUSATE SODIUM 8.6-50 MG PO TABS: 1.0000 | ORAL_TABLET | Freq: Every evening | ORAL | Status: DC | PRN

## 2022-06-06 MED ORDER — POVIDONE-IODINE 10 % EX SWAB
2.0000 | Freq: Once | CUTANEOUS | Status: AC
  Administered 2022-06-06: 2 via TOPICAL

## 2022-06-06 MED ORDER — DEXAMETHASONE SODIUM PHOSPHATE 10 MG/ML IJ SOLN
INTRAMUSCULAR | Status: DC | PRN
  Administered 2022-06-06: 5 mg via INTRAVENOUS

## 2022-06-06 MED ORDER — METHYLENE BLUE 1 % INJ SOLN
INTRAVENOUS | Status: DC | PRN
  Administered 2022-06-06: 1 mL

## 2022-06-06 MED ORDER — PROPOFOL 10 MG/ML IV BOLUS
INTRAVENOUS | Status: AC
  Filled 2022-06-06: qty 20

## 2022-06-06 MED ORDER — OXYCODONE HCL 5 MG PO TABS
5.0000 mg | ORAL_TABLET | ORAL | Status: DC | PRN
  Administered 2022-06-06 – 2022-06-10 (×7): 10 mg via ORAL
  Filled 2022-06-06 (×7): qty 2

## 2022-06-06 MED ORDER — ASPIRIN 81 MG PO TBEC
81.0000 mg | DELAYED_RELEASE_TABLET | Freq: Every day | ORAL | Status: DC
  Administered 2022-06-06 – 2022-06-09 (×4): 81 mg via ORAL
  Filled 2022-06-06 (×4): qty 1

## 2022-06-06 MED ORDER — KETAMINE HCL 50 MG/5ML IJ SOSY
PREFILLED_SYRINGE | INTRAMUSCULAR | Status: AC
  Filled 2022-06-06: qty 5

## 2022-06-06 MED ORDER — FENTANYL CITRATE (PF) 250 MCG/5ML IJ SOLN
INTRAMUSCULAR | Status: AC
  Filled 2022-06-06: qty 5

## 2022-06-06 MED ORDER — ROCURONIUM BROMIDE 10 MG/ML (PF) SYRINGE
PREFILLED_SYRINGE | INTRAVENOUS | Status: AC
  Filled 2022-06-06: qty 20

## 2022-06-06 MED ORDER — PROPOFOL 10 MG/ML IV BOLUS
INTRAVENOUS | Status: DC | PRN
  Administered 2022-06-06: 150 mg via INTRAVENOUS

## 2022-06-06 MED ORDER — OXYCODONE HCL 5 MG PO TABS: 5.0000 mg | ORAL_TABLET | Freq: Once | ORAL | Status: DC | PRN

## 2022-06-06 MED ORDER — PHENYLEPHRINE HCL-NACL 20-0.9 MG/250ML-% IV SOLN
INTRAVENOUS | Status: DC | PRN
  Administered 2022-06-06: 15 ug/min via INTRAVENOUS

## 2022-06-06 MED ORDER — TRANEXAMIC ACID-NACL 1000-0.7 MG/100ML-% IV SOLN
1000.0000 mg | INTRAVENOUS | Status: AC
  Administered 2022-06-06: 1000 mg via INTRAVENOUS

## 2022-06-06 MED ORDER — ROCURONIUM BROMIDE 10 MG/ML (PF) SYRINGE
PREFILLED_SYRINGE | INTRAVENOUS | Status: DC | PRN
  Administered 2022-06-06 (×3): 20 mg via INTRAVENOUS
  Administered 2022-06-06: 10 mg via INTRAVENOUS
  Administered 2022-06-06: 20 mg via INTRAVENOUS
  Administered 2022-06-06: 60 mg via INTRAVENOUS

## 2022-06-06 MED ORDER — OXYCODONE HCL 5 MG/5ML PO SOLN: 5.0000 mg | Freq: Once | ORAL | Status: DC | PRN

## 2022-06-06 MED ORDER — VANCOMYCIN HCL 1000 MG IV SOLR
INTRAVENOUS | Status: AC
  Filled 2022-06-06: qty 20

## 2022-06-06 MED ORDER — TOBRAMYCIN SULFATE 1.2 G IJ SOLR
INTRAMUSCULAR | Status: AC
  Filled 2022-06-06: qty 1.2

## 2022-06-06 MED ORDER — CHLORHEXIDINE GLUCONATE 0.12 % MT SOLN
15.0000 mL | Freq: Once | OROMUCOSAL | Status: AC
  Administered 2022-06-06: 15 mL via OROMUCOSAL
  Filled 2022-06-06: qty 15

## 2022-06-06 MED ORDER — MENTHOL 3 MG MT LOZG: 1.0000 | LOZENGE | OROMUCOSAL | Status: DC | PRN

## 2022-06-06 MED ORDER — LIDOCAINE 2% (20 MG/ML) 5 ML SYRINGE
INTRAMUSCULAR | Status: DC | PRN
  Administered 2022-06-06: 60 mg via INTRAVENOUS

## 2022-06-06 MED ORDER — ENOXAPARIN SODIUM 40 MG/0.4ML IJ SOSY
40.0000 mg | PREFILLED_SYRINGE | INTRAMUSCULAR | Status: DC
  Administered 2022-06-07 – 2022-06-08 (×2): 40 mg via SUBCUTANEOUS
  Filled 2022-06-06 (×2): qty 0.4

## 2022-06-06 MED ORDER — METOCLOPRAMIDE HCL 5 MG PO TABS: 5.0000 mg | ORAL_TABLET | Freq: Three times a day (TID) | ORAL | Status: DC | PRN

## 2022-06-06 MED ORDER — ONDANSETRON HCL 4 MG/2ML IJ SOLN
INTRAMUSCULAR | Status: AC
  Filled 2022-06-06: qty 2

## 2022-06-06 MED ORDER — ALBUMIN HUMAN 5 % IV SOLN: INTRAVENOUS | Status: DC | PRN

## 2022-06-06 MED ORDER — ALUM & MAG HYDROXIDE-SIMETH 200-200-20 MG/5ML PO SUSP: 30.0000 mL | ORAL | Status: DC | PRN

## 2022-06-06 MED ORDER — KETAMINE HCL 10 MG/ML IJ SOLN
INTRAMUSCULAR | Status: DC | PRN
  Administered 2022-06-06: 10 mg via INTRAVENOUS
  Administered 2022-06-06: 30 mg via INTRAVENOUS

## 2022-06-06 MED ORDER — ONDANSETRON HCL 4 MG/2ML IJ SOLN: 4.0000 mg | Freq: Four times a day (QID) | INTRAMUSCULAR | Status: DC | PRN

## 2022-06-06 MED ORDER — HYDROMORPHONE HCL 1 MG/ML IJ SOLN
INTRAMUSCULAR | Status: AC
  Filled 2022-06-06: qty 1

## 2022-06-06 MED ORDER — TOBRAMYCIN SULFATE 1.2 G IJ SOLR
INTRAMUSCULAR | Status: DC | PRN
  Administered 2022-06-06: 1.2 g

## 2022-06-06 MED ORDER — HYDROMORPHONE HCL 1 MG/ML IJ SOLN
0.2500 mg | INTRAMUSCULAR | Status: DC | PRN
  Administered 2022-06-06 (×2): 0.5 mg via INTRAVENOUS

## 2022-06-06 MED ORDER — THIAMINE HCL 100 MG PO TABS: 100.0000 mg | ORAL_TABLET | Freq: Every day | ORAL | Status: DC

## 2022-06-06 MED ORDER — HYDROMORPHONE HCL 1 MG/ML IJ SOLN: 0.5000 mg | INTRAMUSCULAR | Status: DC | PRN

## 2022-06-06 MED ORDER — METOPROLOL TARTRATE 50 MG PO TABS
ORAL_TABLET | ORAL | Status: AC
  Administered 2022-06-06: 100 mg via ORAL
  Filled 2022-06-06: qty 2

## 2022-06-06 MED ORDER — PHENYLEPHRINE 80 MCG/ML (10ML) SYRINGE FOR IV PUSH (FOR BLOOD PRESSURE SUPPORT)
PREFILLED_SYRINGE | INTRAVENOUS | Status: DC | PRN
  Administered 2022-06-06 (×8): 80 ug via INTRAVENOUS

## 2022-06-06 MED ORDER — CEFAZOLIN SODIUM-DEXTROSE 2-4 GM/100ML-% IV SOLN
2.0000 g | INTRAVENOUS | Status: AC
  Administered 2022-06-06 (×2): 2 g via INTRAVENOUS
  Filled 2022-06-06: qty 100

## 2022-06-06 MED ORDER — LIDOCAINE 2% (20 MG/ML) 5 ML SYRINGE
INTRAMUSCULAR | Status: AC
  Filled 2022-06-06: qty 5

## 2022-06-06 MED ORDER — CEFAZOLIN SODIUM-DEXTROSE 2-4 GM/100ML-% IV SOLN
2.0000 g | Freq: Four times a day (QID) | INTRAVENOUS | Status: AC
  Administered 2022-06-06 – 2022-06-07 (×2): 2 g via INTRAVENOUS
  Filled 2022-06-06 (×2): qty 100

## 2022-06-06 MED ORDER — SODIUM CHLORIDE (PF) 0.9 % IJ SOLN
INTRAMUSCULAR | Status: AC
  Filled 2022-06-06: qty 10

## 2022-06-06 MED ORDER — DOCUSATE SODIUM 100 MG PO CAPS
100.0000 mg | ORAL_CAPSULE | Freq: Two times a day (BID) | ORAL | Status: DC
  Administered 2022-06-06 – 2022-06-10 (×8): 100 mg via ORAL
  Filled 2022-06-06 (×8): qty 1

## 2022-06-06 MED ORDER — ACETAMINOPHEN 500 MG PO TABS
1000.0000 mg | ORAL_TABLET | Freq: Four times a day (QID) | ORAL | Status: DC
  Administered 2022-06-06 – 2022-06-10 (×13): 1000 mg via ORAL
  Filled 2022-06-06 (×15): qty 2

## 2022-06-06 MED ORDER — AMISULPRIDE (ANTIEMETIC) 5 MG/2ML IV SOLN: 10.0000 mg | Freq: Once | INTRAVENOUS | Status: DC | PRN

## 2022-06-06 MED ORDER — CYCLOBENZAPRINE HCL 10 MG PO TABS
10.0000 mg | ORAL_TABLET | Freq: Three times a day (TID) | ORAL | Status: DC | PRN
  Administered 2022-06-07 – 2022-06-10 (×3): 10 mg via ORAL
  Filled 2022-06-06 (×3): qty 1

## 2022-06-06 MED ORDER — METOCLOPRAMIDE HCL 5 MG/ML IJ SOLN: 5.0000 mg | Freq: Three times a day (TID) | INTRAMUSCULAR | Status: DC | PRN

## 2022-06-06 MED ORDER — ONDANSETRON HCL 4 MG/2ML IJ SOLN: 4.0000 mg | Freq: Once | INTRAMUSCULAR | Status: DC | PRN

## 2022-06-06 MED ORDER — LACTATED RINGERS IV SOLN: INTRAVENOUS | Status: DC

## 2022-06-06 MED ORDER — SODIUM CHLORIDE 0.9 % IV SOLN: INTRAVENOUS | Status: DC | PRN

## 2022-06-06 MED ORDER — VANCOMYCIN HCL 1000 MG IV SOLR
INTRAVENOUS | Status: DC | PRN
  Administered 2022-06-06: 1000 mg

## 2022-06-06 MED ORDER — DEXAMETHASONE SODIUM PHOSPHATE 10 MG/ML IJ SOLN
INTRAMUSCULAR | Status: AC
  Filled 2022-06-06: qty 1

## 2022-06-06 MED ORDER — METOPROLOL TARTRATE 100 MG PO TABS
100.0000 mg | ORAL_TABLET | Freq: Once | ORAL | Status: AC
  Filled 2022-06-06: qty 1

## 2022-06-06 SURGICAL SUPPLY — 72 items
BAG COUNTER SPONGE SURGICOUNT (BAG) ×1 IMPLANT
BANDAGE ESMARK 6X9 LF (GAUZE/BANDAGES/DRESSINGS) ×1 IMPLANT
BIT DRILL Q/COUPLING 1 (BIT) IMPLANT
BNDG COHESIVE 6X5 TAN STRL LF (GAUZE/BANDAGES/DRESSINGS) ×1 IMPLANT
BNDG ELASTIC 4X5.8 VLCR STR LF (GAUZE/BANDAGES/DRESSINGS) ×1 IMPLANT
BNDG ELASTIC 6X5.8 VLCR STR LF (GAUZE/BANDAGES/DRESSINGS) ×1 IMPLANT
BNDG ESMARK 6X9 LF (GAUZE/BANDAGES/DRESSINGS) ×1
BNDG GAUZE DERMACEA FLUFF 4 (GAUZE/BANDAGES/DRESSINGS) ×2 IMPLANT
BONE CANC CHIPS 40CC CAN1/2 (Bone Implant) ×1 IMPLANT
BONE CHIP PRESERV 40CC PCAN1/2 (Bone Implant) ×1 IMPLANT
BRUSH SCRUB EZ PLAIN DRY (MISCELLANEOUS) ×2 IMPLANT
CATH FOLEY 2WAY SLVR  5CC 16FR (CATHETERS) ×1
CATH FOLEY 2WAY SLVR 5CC 16FR (CATHETERS) IMPLANT
CEMENT BONE R 1X40 (Cement) IMPLANT
CHIPS CANC BONE 40CC CAN1/2 (Bone Implant) ×1 IMPLANT
COVER SURGICAL LIGHT HANDLE (MISCELLANEOUS) ×2 IMPLANT
DRAPE C-ARM 42X72 X-RAY (DRAPES) ×1 IMPLANT
DRAPE C-ARMOR (DRAPES) ×1 IMPLANT
DRAPE IMP U-DRAPE 54X76 (DRAPES) ×1 IMPLANT
DRAPE ORTHO SPLIT 77X108 STRL (DRAPES) ×2
DRAPE SURG ORHT 6 SPLT 77X108 (DRAPES) ×2 IMPLANT
DRAPE U-SHAPE 47X51 STRL (DRAPES) ×1 IMPLANT
DRESSING PREVENA PLUS CUSTOM (GAUZE/BANDAGES/DRESSINGS) IMPLANT
DRSG ADAPTIC 3X8 NADH LF (GAUZE/BANDAGES/DRESSINGS) ×1 IMPLANT
DRSG PREVENA PLUS CUSTOM (GAUZE/BANDAGES/DRESSINGS) ×1
ELECT REM PT RETURN 9FT ADLT (ELECTROSURGICAL) ×1
ELECTRODE REM PT RTRN 9FT ADLT (ELECTROSURGICAL) ×1 IMPLANT
GAUZE PAD ABD 8X10 STRL (GAUZE/BANDAGES/DRESSINGS) ×4 IMPLANT
GAUZE SPONGE 4X4 12PLY STRL (GAUZE/BANDAGES/DRESSINGS) ×2 IMPLANT
GLOVE BIO SURGEON STRL SZ7.5 (GLOVE) ×1 IMPLANT
GLOVE BIO SURGEON STRL SZ8 (GLOVE) ×1 IMPLANT
GLOVE BIOGEL PI IND STRL 7.5 (GLOVE) ×1 IMPLANT
GLOVE BIOGEL PI IND STRL 8 (GLOVE) ×1 IMPLANT
GLOVE SURG ORTHO LTX SZ7.5 (GLOVE) ×2 IMPLANT
GOWN STRL REUS W/ TWL LRG LVL3 (GOWN DISPOSABLE) ×2 IMPLANT
GOWN STRL REUS W/ TWL XL LVL3 (GOWN DISPOSABLE) ×1 IMPLANT
GOWN STRL REUS W/TWL LRG LVL3 (GOWN DISPOSABLE) ×2
GOWN STRL REUS W/TWL XL LVL3 (GOWN DISPOSABLE) ×1
GRAFT BNE CANC CHIPS 1-8 40CC (Bone Implant) IMPLANT
GRAFT BNE CHIP CANC 1-8 40 (Bone Implant) IMPLANT
GUIDEPIN VERSANAIL DSP 3.2X444 (ORTHOPEDIC DISPOSABLE SUPPLIES) IMPLANT
KIT BASIN OR (CUSTOM PROCEDURE TRAY) ×1 IMPLANT
KIT INFUSE LRG II (Orthopedic Implant) IMPLANT
KIT TURNOVER KIT B (KITS) ×1 IMPLANT
MANIFOLD NEPTUNE II (INSTRUMENTS) ×1 IMPLANT
NS IRRIG 1000ML POUR BTL (IV SOLUTION) ×1 IMPLANT
PACK ORTHO EXTREMITY (CUSTOM PROCEDURE TRAY) ×1 IMPLANT
PACK TOTAL JOINT (CUSTOM PROCEDURE TRAY) ×1 IMPLANT
PACK UNIVERSAL I (CUSTOM PROCEDURE TRAY) ×1 IMPLANT
PAD ARMBOARD 7.5X6 YLW CONV (MISCELLANEOUS) ×2 IMPLANT
PADDING CAST COTTON 6X4 STRL (CAST SUPPLIES) ×3 IMPLANT
PLATE CONDYLR 95D 12H/60/204 (Plate) IMPLANT
SCREW CORTEX ST 4.5X36 (Screw) IMPLANT
SCREW CORTEX ST 4.5X54 (Screw) IMPLANT
SPONGE T-LAP 18X18 ~~LOC~~+RFID (SPONGE) ×1 IMPLANT
STAPLER VISISTAT 35W (STAPLE) ×1 IMPLANT
STOCKINETTE IMPERVIOUS LG (DRAPES) ×1 IMPLANT
SUT VIC AB 0 CT1 27 (SUTURE) ×3
SUT VIC AB 0 CT1 27XBRD ANBCTR (SUTURE) ×1 IMPLANT
SUT VIC AB 1 CT1 18XCR BRD 8 (SUTURE) IMPLANT
SUT VIC AB 1 CT1 27 (SUTURE) ×4
SUT VIC AB 1 CT1 27XBRD ANTBC (SUTURE) ×4 IMPLANT
SUT VIC AB 1 CT1 8-18 (SUTURE) ×1
SUT VIC AB 2-0 CT1 27 (SUTURE) ×3
SUT VIC AB 2-0 CT1 TAPERPNT 27 (SUTURE) ×1 IMPLANT
TOWEL GREEN STERILE (TOWEL DISPOSABLE) ×2 IMPLANT
TOWEL GREEN STERILE FF (TOWEL DISPOSABLE) ×2 IMPLANT
TOWER CARTRIDGE SMART MIX (DISPOSABLE) IMPLANT
TUBE CONNECTING 12X1/4 (SUCTIONS) ×1 IMPLANT
UNDERPAD 30X36 HEAVY ABSORB (UNDERPADS AND DIAPERS) ×1 IMPLANT
WATER STERILE IRR 1000ML POUR (IV SOLUTION) ×3 IMPLANT
YANKAUER SUCT BULB TIP NO VENT (SUCTIONS) ×1 IMPLANT

## 2022-06-06 NOTE — H&P (Signed)
[facility] H&P  Patient ID: Jonathon Anderson MRN: [id number] DOB/AGE: 1955-10-16 66 y.o.  Chief Complaint:  Left intertroch nonunion HPI: Jonathon Anderson is an 66 y.o. male.s/p IMN in Oct 2022 by Dr. Joice Lofts at [facility]. Screws have cut out and nonunion present.  Past Medical History:  Diagnosis Date   Hyperlipidemia    Hypertension     Past Surgical History:  Procedure Laterality Date   INTRAMEDULLARY (IM) NAIL INTERTROCHANTERIC Left 05/11/2021   Procedure: INTRAMEDULLARY (IM) NAIL INTERTROCHANTRIC OF LEFT SUBTHROCHANTRIC HIP FRACTURE;  Surgeon: Christena Flake, MD;  Location: [facility] ORS;  Service: Orthopedics;  Laterality: Left;    Family History  Problem Relation Age of Onset   Heart attack Mother    Hypertension Brother    Heart failure Paternal Grandfather    Social History:  reports that he quit smoking 5 days ago. His smoking use included cigarettes. He smoked an average of .5 packs per day. He has never used smokeless tobacco. He reports that he does not currently use alcohol. He reports that he does not use drugs.  Allergies: No Known Allergies  Medications Prior to Admission  Medication Sig Dispense Refill   acetaminophen (TYLENOL) 650 MG CR tablet Take 1,300 mg by mouth 2 (two) times daily as needed for pain or fever.     aspirin EC 81 MG tablet Take 81 mg by mouth at bedtime. Swallow whole.     atorvastatin (LIPITOR) 20 MG tablet Take 20 mg by mouth at bedtime.     Calcium Carb-Cholecalciferol (CALCIUM 600+D3 PO) Take 1 tablet by mouth 2 (two) times daily.     cyclobenzaprine (FLEXERIL) 10 MG tablet Take 10 mg by mouth 2 (two) times daily as needed for muscle spasms.     diclofenac (VOLTAREN) 75 MG EC tablet Take 75 mg by mouth 2 (two) times daily.     metoprolol tartrate (LOPRESSOR) 100 MG tablet Take 100 mg by mouth 2 (two) times daily.     Omega-3 Fatty Acids (FISH OIL) 1200 MG CAPS Take 1,200 mg by mouth 2 (two) times daily.      triamterene-hydrochlorothiazide (MAXZIDE) 75-50 MG tablet Take 1 tablet by mouth every morning.     enoxaparin (LOVENOX) 40 MG/0.4ML injection Inject 0.4 mLs (40 mg total) into the skin daily for 14 days. (Patient not taking: Reported on 06/05/2022) 5.6 mL 0   ferrous fumarate-b12-vitamic C-folic acid (TRINSICON / FOLTRIN) capsule Take 1 capsule by mouth 3 (three) times daily after meals. (Patient not taking: Reported on 06/05/2022)     magnesium oxide (MAG-OX) 400 (240 Mg) MG tablet Take 1 tablet (400 mg total) by mouth daily. (Patient not taking: Reported on 06/05/2022)     senna-docusate (SENOKOT-S) 8.6-50 MG tablet Take 1 tablet by mouth at bedtime. (Patient not taking: Reported on 06/05/2022)     thiamine 100 MG tablet Take 1 tablet (100 mg total) by mouth daily. (Patient not taking: Reported on 06/05/2022)      Results for orders placed or performed during the hospital encounter of 06/06/22 (from the past 48 hour(s))  C-reactive protein     Status: Abnormal   Collection Time: 06/06/22  5:47 AM  Result Value Ref Range   CRP 5.5 (H) <1.0 mg/dL    Comment: Performed at [facility], [address], [place], [place] [address]  Hemoglobin A1c     Status: None   Collection Time: 06/06/22  5:47 AM  Result Value Ref Range  Hgb A1c MFr Bld 5.0 4.8 - 5.6 %    Comment: (NOTE) Pre diabetes:          5.7%-6.4%  Diabetes:              >6.4%  Glycemic control for   <7.0% adults with diabetes    Mean Plasma Glucose 96.8 mg/dL    Comment: Performed at [facility], [address] [place], [place] [address]  Sedimentation rate     Status: Abnormal   Collection Time: 06/06/22  5:47 AM  Result Value Ref Range   Sed Rate 40 (H) 0 - 16 mm/hr    Comment: Performed at [facility], [address], [place], [place] [address]  Comprehensive metabolic panel     Status: Abnormal   Collection Time: 06/06/22  5:47 AM  Result Value Ref Range   Sodium 130 (L) 135 - 145 mmol/L    Potassium 3.1 (L) 3.5 - 5.1 mmol/L   Chloride 92 (L) 98 - 111 mmol/L   CO2 27 22 - 32 mmol/L   Glucose, Bld 112 (H) 70 - 99 mg/dL    Comment: Glucose reference range applies only to samples taken after fasting for at least 8 hours.   BUN 5 (L) 8 - 23 mg/dL   Creatinine, Ser 2.83 0.61 - 1.24 mg/dL   Calcium 8.5 (L) 8.9 - 10.3 mg/dL   Total Protein 6.7 6.5 - 8.1 g/dL   Albumin 3.1 (L) 3.5 - 5.0 g/dL   AST 12 (L) 15 - 41 U/L   ALT 8 0 - 44 U/L   Alkaline Phosphatase 108 38 - 126 U/L   Total Bilirubin 0.4 0.3 - 1.2 mg/dL   GFR, Estimated >15 >17 mL/min    Comment: (NOTE) Calculated using the CKD-EPI Creatinine Equation (2021)    Anion gap 11 5 - 15    Comment: Performed at [facility], [address] [place], [place] [address]  CBC WITH DIFFERENTIAL     Status: Abnormal   Collection Time: 06/06/22  5:47 AM  Result Value Ref Range   WBC 5.8 4.0 - 10.5 K/uL   RBC 3.47 (L) 4.22 - 5.81 MIL/uL   Hemoglobin 11.2 (L) 13.0 - 17.0 g/dL   HCT 37.1 (L) 06.2 - 69.4 %   MCV 92.5 80.0 - 100.0 fL   MCH 32.3 26.0 - 34.0 pg   MCHC 34.9 30.0 - 36.0 g/dL   RDW 85.4 62.7 - 03.5 %   Platelets 300 150 - 400 K/uL   nRBC 0.0 0.0 - 0.2 %   Neutrophils Relative % 74 %   Neutro Abs 4.3 1.7 - 7.7 K/uL   Lymphocytes Relative 17 %   Lymphs Abs 1.0 0.7 - 4.0 K/uL   Monocytes Relative 5 %   Monocytes Absolute 0.3 0.1 - 1.0 K/uL   Eosinophils Relative 4 %   Eosinophils Absolute 0.2 0.0 - 0.5 K/uL   Basophils Relative 0 %   Basophils Absolute 0.0 0.0 - 0.1 K/uL   Immature Granulocytes 0 %   Abs Immature Granulocytes 0.02 0.00 - 0.07 K/uL    Comment: Performed at [facility], [address], [place], [place] [address]   DG Chest Portable 1 View  Result Date: 06/06/2022 CLINICAL DATA:  Preop for femur fracture. EXAM: PORTABLE CHEST 1 VIEW COMPARISON:  05/10/2021 FINDINGS: Normal heart size and stable aortic contours. Mild linear atelectasis at the right minor fissure. There is no edema,  consolidation, effusion, or pneumothorax. IMPRESSION: Minimal atelectasis at the right minor fissure. Otherwise stable from 2022. Electronically Signed   By: Tiburcio Pea M.D.   On: 06/06/2022 06:09    ROS No recent fever, bleeding abnormalities, urologic dysfunction, GI problems, or weight gain.  Blood pressure 119/83, pulse (!) 108, temperature 97.6 F (36.4 C), temperature source Oral, resp. rate 18, height 5' 8.5" (1.74 m), weight 96.2 kg, SpO2 99 %. Physical Exam NCAT, very pleasant RRR LLE Incision well healed without evidence of drainage  Edema/ swelling controlled  Sens: DPN, SPN, TN intact  Motor: EHL, FHL, and lessor toe ext and flex all intact grossly  Brisk cap refill, warm to touch    Assessment/Plan  Left intertroch nonunion with loose hardware  The risks and benefits of surgical repair and bone grafting were discussed with the patient, including the possibility of infection, nerve injury, vessel injury, wound breakdown, arthritis, symptomatic hardware, DVT/ PE, loss of motion, malunion, nonunion, and need for further surgery among others.  We also specifically discussed the possible need to stage surgery because of the elevated risk of soft tissue breakdown that could lead to amputation.  These risks were acknowledged and consent provided to proceed.   Myrene Galas, MD [facility], [facility] [phone]  06/06/2022, 7:49 AM  [facility] [address] [place] [address] [phone] (F)

## 2022-06-06 NOTE — Anesthesia Preprocedure Evaluation (Addendum)
Anesthesia Evaluation  Patient identified by MRN, date of birth, ID band Patient awake    Reviewed: Allergy & Precautions, NPO status , Patient's Chart, lab work & pertinent test results, reviewed documented beta blocker date and time   Airway Mallampati: II  TM Distance: >3 FB Neck ROM: Full    Dental  (+) Poor Dentition, Dental Advisory Given, Missing, Chipped,    Pulmonary former smoker Quit smoking last week   Pulmonary exam normal breath sounds clear to auscultation       Cardiovascular hypertension (119/83 in preop), Pt. on medications and Pt. on home beta blockers Normal cardiovascular exam Rhythm:Regular Rate:Normal     Neuro/Psych negative neurological ROS  negative psych ROS   GI/Hepatic negative GI ROS, Neg liver ROS,,,  Endo/Other  negative endocrine ROS    Renal/GU negative Renal ROS  negative genitourinary   Musculoskeletal L hip fx   Abdominal   Peds  Hematology  (+) Blood dyscrasia, anemia Hb 11.2, plt 300   Anesthesia Other Findings   Reproductive/Obstetrics negative OB ROS                             Anesthesia Physical Anesthesia Plan  ASA: 2  Anesthesia Plan: General   Post-op Pain Management: Tylenol PO (pre-op)*, Ketamine IV* and Dilaudid IV   Induction: Intravenous  PONV Risk Score and Plan: 2 and Ondansetron, Dexamethasone, Midazolam and Treatment may vary due to age or medical condition  Airway Management Planned: Oral ETT  Additional Equipment: None  Intra-op Plan:   Post-operative Plan: Extubation in OR  Informed Consent: I have reviewed the patients History and Physical, chart, labs and discussed the procedure including the risks, benefits and alternatives for the proposed anesthesia with the patient or authorized representative who has indicated his/her understanding and acceptance.     Dental advisory given  Plan Discussed with:  CRNA  Anesthesia Plan Comments:         Anesthesia Quick Evaluation

## 2022-06-06 NOTE — Progress Notes (Signed)
Pt seen in room alert/oriented in no apparent distress. Welcome guide/menu provided with instructions. Pt verbalized understanding of instructions. Hospital valuables policy has been thoroughly discussed with no complaints. Pt orientated to room/equipments. Hospital bed in lowest position with 3 side rails up, call bell/room phone within reach, and all wheels locked.

## 2022-06-06 NOTE — Plan of Care (Signed)
  Problem: Education: Goal: Knowledge of General Education information will improve Description: Including pain rating scale, medication(s)/side effects and non-pharmacologic comfort measures Outcome: Progressing   Problem: Health Behavior/Discharge Planning: Goal: Ability to manage health-related needs will improve Outcome: Progressing   Problem: Clinical Measurements: Goal: Will remain free from infection Outcome: Progressing   Problem: Activity: Goal: Risk for activity intolerance will decrease Outcome: Progressing   Problem: Elimination: Goal: Will not experience complications related to bowel motility Outcome: Progressing   Problem: Pain Managment: Goal: General experience of comfort will improve Outcome: Progressing   

## 2022-06-06 NOTE — Progress Notes (Signed)
   06/06/22 1954  Assess: MEWS Score  Temp 98.6 F (37 C)  BP 97/76  Pulse Rate (!) 105  Resp 16  Level of Consciousness Alert  SpO2 98 %  O2 Device Room Air  Assess: MEWS Score  MEWS Temp 0  MEWS Systolic 1  MEWS Pulse 1  MEWS RR 0  MEWS LOC 0  MEWS Score 2  MEWS Score Color Yellow  Assess: if the MEWS score is Yellow or Red  Were vital signs taken at a resting state? Yes  Focused Assessment Change from prior assessment (see assessment flowsheet)  Does the patient meet 2 or more of the SIRS criteria? No  MEWS guidelines implemented *See Row Information* No, vital signs rechecked  Treat  MEWS Interventions Administered prn meds/treatments  Pain Scale 0-10  Pain Score 8  Pain Intervention(s) Medication (See eMAR)  Assess: SIRS CRITERIA  SIRS Temperature  0  SIRS Pulse 1  SIRS Respirations  0  SIRS WBC 1  SIRS Score Sum  2

## 2022-06-06 NOTE — Transfer of Care (Signed)
Immediate Anesthesia Transfer of Care Note  Patient: Jonathon Anderson  Procedure(s) Performed: OPEN REDUCTION INTERNAL FIXATION FEMORAL PROXIMAL FRACTURE (Left: Leg Upper) HARDWARE REMOVAL (Left: Leg Upper)  Patient Location: PACU  Anesthesia Type:General  Level of Consciousness: drowsy  Airway & Oxygen Therapy: Patient Spontanous Breathing and Patient connected to face mask oxygen  Post-op Assessment: Report given to RN and Post -op Vital signs reviewed and stable  Post vital signs: Reviewed and stable  Last Vitals:  Vitals Value Taken Time  BP 127/73 06/06/22 1319  Temp    Pulse 93 06/06/22 1323  Resp 16 06/06/22 1323  SpO2 100 % 06/06/22 1323  Vitals shown include unvalidated device data.  Last Pain:  Vitals:   06/06/22 0642  TempSrc:   PainSc: 10-Worst pain ever         Complications: No notable events documented.

## 2022-06-06 NOTE — Anesthesia Postprocedure Evaluation (Signed)
Anesthesia Post Note  Patient: Jonathon Anderson  Procedure(s) Performed: OPEN REDUCTION INTERNAL FIXATION FEMORAL PROXIMAL FRACTURE (Left: Leg Upper) HARDWARE REMOVAL (Left: Leg Upper)     Patient location during evaluation: PACU Anesthesia Type: General Level of consciousness: awake and alert, oriented and patient cooperative Pain management: pain level controlled Vital Signs Assessment: post-procedure vital signs reviewed and stable Respiratory status: spontaneous breathing, nonlabored ventilation and respiratory function stable Cardiovascular status: blood pressure returned to baseline and stable Postop Assessment: no apparent nausea or vomiting Anesthetic complications: no   No notable events documented.  Last Vitals:  Vitals:   06/06/22 1515 06/06/22 1532  BP: 120/76 98/72  Pulse: 95 98  Resp: 14 17  Temp: 37.2 C 37 C  SpO2: 98% 97%    Last Pain:  Vitals:   06/06/22 1532  TempSrc: Oral  PainSc:                  Lannie Fields

## 2022-06-06 NOTE — Brief Op Note (Signed)
06/06/2022  2:09 PM  PATIENT:  Jonathon Anderson  66 y.o. male  PRE-OPERATIVE DIAGNOSIS:  LEFT INTERTROCHANTERIC NONUNION  POST-OPERATIVE DIAGNOSIS:  LEFT INTERTROCHANTERIC NONUNION  PROCEDURE:  Procedure(s): OPEN REDUCTION INTERNAL FIXATION FEMORAL PROXIMAL FRACTURE (Left) HARDWARE REMOVAL (Left)  SURGEON:  Surgeon(s) and Role:    Myrene Galas, MD - Primary  PHYSICIAN ASSISTANT: Montez Morita, PA-C  ANESTHESIA:   general  I/O:  Total I/O In: 2800 [I.V.:1950; IV Piggyback:850] Out: 1510 [Urine:610; Blood:900]  SPECIMEN:  Source of Specimen:  deep tissue, some within bone  TOURNIQUET:  * No tourniquets in log *  COMPLICATIONS: NONE  DICTATION:   DISPOSITION: TO PACU  CONDITION: STABLE  DELAY START OF DVT PROPHYLAXIS BECAUSE OF BLEEDING RISK: NO

## 2022-06-06 NOTE — Anesthesia Procedure Notes (Signed)
Procedure Name: Intubation Date/Time: 06/06/2022 8:18 AM  Performed by: Colin Benton, CRNAPre-anesthesia Checklist: Patient identified, Emergency Drugs available, Suction available and Patient being monitored Patient Re-evaluated:Patient Re-evaluated prior to induction Oxygen Delivery Method: Circle system utilized Preoxygenation: Pre-oxygenation with 100% oxygen Induction Type: IV induction Ventilation: Mask ventilation without difficulty Laryngoscope Size: Mac and 3 Grade View: Grade I Tube type: Oral Tube size: 7.5 mm Number of attempts: 1 Airway Equipment and Method: Stylet Placement Confirmation: ETT inserted through vocal cords under direct vision, positive ETCO2 and breath sounds checked- equal and bilateral Secured at: 23 cm Tube secured with: Tape Dental Injury: Teeth and Oropharynx as per pre-operative assessment

## 2022-06-06 NOTE — Op Note (Signed)
06/06/2022  2:09 PM  PATIENT:  Jonathon Anderson  66 y.o. male  PRE-OPERATIVE DIAGNOSIS:   1. LEFT INTERTROCHANTERIC NONUNION 2. GROSSLY LOOSE HARDWARE WITH HEAD PENETRATION  POST-OPERATIVE DIAGNOSIS:  1. LEFT INTERTROCHANTERIC NONUNION 2. GROSSLY LOOSE HARDWARE WITH HEAD PENETRATION  PROCEDURES: 1.  Repair of left femoral nonunion using Infuse allograft. 2.  Open reduction internal fixation of left subtrochanteric femur with a 95 degree Synthes blade plate. 3.  Removal of deep implant, left femur, complicated. 4.  Insertion of antibiotic cement spacer. 5.  Application of a small wound vac.  SURGEON:  Surgeon(s) and Role:    * Myrene Galas, MD - Primary  PHYSICIAN ASSISTANT: Montez Morita, PA-C  ANESTHESIA:   general  I/O:  Total I/O In: 2800 [I.V.:1950; IV Piggyback:850] Out: 1510 [Urine:610; Blood:900]  SPECIMEN:  Source of Specimen:  deep tissue, some within bone  TOURNIQUET:  * No tourniquets in log *  COMPLICATIONS: NONE  DICTATION; Written in EPIC  DISPOSITION: TO PACU  CONDITION: STABLE  DELAY START OF DVT PROPHYLAXIS BECAUSE OF BLEEDING RISK: NO  BRIEF SUMMARY INDICATIONS FOR PROCEDURE:  This is a very pleasant 66 y.o. s/p IM nailing of a left hip subtrochanteric fracture approximately 2 years  ago by Dr. Joice Lofts at [facility].  This developed into an atrophic nonunion with fatigue fracture of the implant and varus malalignment. I discussed with the patient preoperatively the risks and benefits of surgical treatment including that we intended to take a specimen from the nonunion site, the possibility of failure to unite and elevated risk of skin and soft tissue problems because of previous surgery.  Other risks include heart attack, stroke, nerve injury, vessel injury, DVT, PE, blood loss requiring transfusion, the possibility of having to stage the surgery with a cement spacer, and others.  After acknowledging these risks, consent was provided to proceed.  BRIEF  SUMMARY OF PROCEDURE:  The patient was taken to the operating room after administration of preoperative antibiotics.  The operative lower extremity was prepped and draped in the usual sterile fashion with a chlorhexidine wash, Betadine scrub and paint  of the extremity.  We brought in the C-arm to identify the correct starting point and trajectory which did require additional incisions for removal of the nail that were unrelated to subsequent instrumentation of the femur fracture itself.  A proximal 3  cm incision was made.  Dissection was carried down through the tensor to the head of the nail, which was cleaned out with a curette.  The set screw was then loosened and then a subsequent incision made along the lateral aspect of the thigh, through which the lag  screw was removed from the head.  Additional locking bolts were removed at the knee but this was complicated by severe loosening and bone growth over the heads. The nail was engaged proximally and withdrawn. Wounds were irrigated thoroughly.  I then made a more formal incision to approach the nonunion site and for subsequent application of the blade plate.  The tensor was split in line with the  skin and then the vastus was mobilized of the fascia and retracted anteriorly. I was careful to preserve the periosteum directly on the bone at the nonunion site itself.  A formal nonunion repair was required.  I removed the fibrous tissue after incising it with a 10 blade.  I did pass some of this tissue off for culture.  It should be noted upon entering close to the lag screw itself, I encountered  a bursa and sent some of this  material for microanalysis but no overt purulence.  We also sent tissue from within the canal.  I thoroughly cleaning the bony ends with a curette and removing some dead dysvascular cortical bone. Managing the femoral head cut out from acute fracture on chronic nonunion was particularly difficult to address. We added contrast to the  Infuse sponge to make sure it and the allograft within it stayed within the head and did not spill out into the joint. It was placed with a pituitary rongeur and also additional cancellous graft. Following this repair, I then turned my attention to instrumentation.  The seating chisel was brought in and advanced after using the guide and a K-wire for orientation.  I was careful to control rotation and alignment throughout.  This was inserted to the appropriate depth and then another screw placed proximally.  I then  brought to the shaft distally allowing the subtroch area to come into slight valgus to improve the biomechanics and healing potential. It was secured with 4 bicortical screws distally.  Wound was irrigated thoroughly.  Final AP and lateral images were obtained and then a layered closure with 0 Vicryl, 1 Vicryl, 2-0 Vicryl and 2-0 nylon.  It was a  complex and difficult closure secondary to prior surgery.  Montez Morita, PA-C, was present and assisting throughout.  PROGNOSIS:  The patient will be touchdown weightbearing on the left lower extremity with unrestricted range of motion of the hip and knee.  We will plan to see him back at 2 weeks after discharge from the hospital, which is anticipated in 2-3 days.   Bleeding was commensurate with extended approach and operative time and transfusion could be required. Risk of persistent or recurrent nonunion is elevated. Formal DVT prophylaxis will be prescribed. Removing the cement spacer and placing bone graft would be anticipated as second stage in 6 wks.

## 2022-06-07 ENCOUNTER — Encounter (HOSPITAL_COMMUNITY): Payer: Self-pay | Admitting: Orthopedic Surgery

## 2022-06-07 LAB — BASIC METABOLIC PANEL
Anion gap: 10 (ref 5–15)
BUN: <5 mg/dL — ABNORMAL LOW (ref 8–23)
CO2: 25 mmol/L (ref 22–32)
Calcium: 7.7 mg/dL — ABNORMAL LOW (ref 8.9–10.3)
Chloride: 96 mmol/L — ABNORMAL LOW (ref 98–111)
Creatinine, Ser: 0.73 mg/dL (ref 0.61–1.24)
GFR, Estimated: >60 mL/min (ref >60)
Glucose, Bld: 128 mg/dL — ABNORMAL HIGH (ref 70–99)
Potassium: 3.7 mmol/L (ref 3.5–5.1)
Sodium: 131 mmol/L — ABNORMAL LOW (ref 135–145)

## 2022-06-07 LAB — CBC
HCT: 21.3 % — ABNORMAL LOW (ref 39.0–52.0)
Hemoglobin: 7.1 g/dL — ABNORMAL LOW (ref 13.0–17.0)
MCH: 31.8 pg (ref 26.0–34.0)
MCHC: 33.3 g/dL (ref 30.0–36.0)
MCV: 95.5 fL (ref 80.0–100.0)
Platelets: 299 10*3/uL (ref 150–400)
RBC: 2.23 MIL/uL — ABNORMAL LOW (ref 4.22–5.81)
RDW: 12.6 % (ref 11.5–15.5)
WBC: 10 10*3/uL (ref 4.0–10.5)
nRBC: 0 % (ref 0.0–0.2)

## 2022-06-07 LAB — POCT I-STAT, CHEM 8
BUN: 5 mg/dL — ABNORMAL LOW (ref 8–23)
BUN: 4 mg/dL — ABNORMAL LOW (ref 8–23)
Calcium, Ion: 1.07 mmol/L — ABNORMAL LOW (ref 1.15–1.40)
Calcium, Ion: 1.06 mmol/L — ABNORMAL LOW (ref 1.15–1.40)
Chloride: 91 mmol/L — ABNORMAL LOW (ref 98–111)
Chloride: 91 mmol/L — ABNORMAL LOW (ref 98–111)
Creatinine, Ser: 0.6 mg/dL — ABNORMAL LOW (ref 0.61–1.24)
Creatinine, Ser: 0.6 mg/dL — ABNORMAL LOW (ref 0.61–1.24)
Glucose, Bld: 114 mg/dL — ABNORMAL HIGH (ref 70–99)
Glucose, Bld: 132 mg/dL — ABNORMAL HIGH (ref 70–99)
HCT: 27 % — ABNORMAL LOW (ref 39.0–52.0)
HCT: 25 % — ABNORMAL LOW (ref 39.0–52.0)
Hemoglobin: 9.2 g/dL — ABNORMAL LOW (ref 13.0–17.0)
Hemoglobin: 8.5 g/dL — ABNORMAL LOW (ref 13.0–17.0)
Potassium: 3.7 mmol/L (ref 3.5–5.1)
Potassium: 3.6 mmol/L (ref 3.5–5.1)
Sodium: 129 mmol/L — ABNORMAL LOW (ref 135–145)
Sodium: 131 mmol/L — ABNORMAL LOW (ref 135–145)
TCO2: 28 mmol/L (ref 22–32)
TCO2: 27 mmol/L (ref 22–32)

## 2022-06-07 LAB — GLUCOSE, CAPILLARY
Glucose-Capillary: 124 mg/dL — ABNORMAL HIGH (ref 70–99)
Glucose-Capillary: 143 mg/dL — ABNORMAL HIGH (ref 70–99)

## 2022-06-07 LAB — HEMOGLOBIN AND HEMATOCRIT, BLOOD
HCT: 21.6 % — ABNORMAL LOW (ref 39.0–52.0)
Hemoglobin: 7.2 g/dL — ABNORMAL LOW (ref 13.0–17.0)

## 2022-06-07 LAB — SURGICAL PATHOLOGY

## 2022-06-07 MED ORDER — CHLORHEXIDINE GLUCONATE CLOTH 2 % EX PADS
6.0000 | MEDICATED_PAD | Freq: Every day | CUTANEOUS | Status: DC
  Administered 2022-06-07: 6 via TOPICAL

## 2022-06-07 MED ORDER — MUPIROCIN 2 % EX OINT
1.0000 | TOPICAL_OINTMENT | Freq: Two times a day (BID) | CUTANEOUS | Status: DC
  Administered 2022-06-07 (×2): 1 via NASAL
  Filled 2022-06-07 (×2): qty 22

## 2022-06-07 NOTE — TOC Initial Note (Signed)
Transition of Care [facility]) - Initial/Assessment Note    Patient Details  Name: Jonathon Anderson MRN: [id number] Date of Birth: [birth year]  [facility] of [facility]) CM/SW Contact:    Joanne Chars, LCSW Phone Number: 06/07/2022, 4:11 PM  Clinical Narrative:      CSW met with pt regarding DC recommendation for SNF.  Pt reports he went to [facility] a year ago when he had his first surgery.  He had [facility] after [facility].  He does not want to return to SNF now and would like to DC home with [facility].  Pt lives with [name], who can be reached at pt home phone number.  His other sister [name] lives 30 minutes away.  He also has a brother in [place].  [name] is home full time and able to assist.  Pt acknowledged he will need help getting up the stairs into his home but once inside, he said he can work with [facility] as needed. Pt reports he has walker and shower chair at home.  CSW attempted to reach both pt sisters and pt brother, unable to reach any of them, left messages.              Expected Discharge Plan: [place] Barriers to Discharge: Continued Medical Work up   Patient Goals and CMS Choice Patient states their goals for this hospitalization and ongoing recovery are:: be like i was, walking      Expected Discharge Plan and Services Expected Discharge Plan: [place] In-house Referral: Clinical Social Work   Post Acute Care Choice: [place] arrangements for the past 2 months: [place]                                      Prior Living Arrangements/Services Living arrangements for the past 2 months: Single Family Home Lives with:: Siblings ([name]) Patient language and need for interpreter reviewed:: Yes Do you feel safe going back to the place where you live?: Yes      Need for Family Participation in Patient Care: Yes (Comment) Care giver support system in place?: Yes (comment) Current home  services: Other (comment) (none) Criminal Activity/Legal Involvement Pertinent to Current Situation/Hospitalization: No - Comment as needed  Activities of Daily Living      Permission Sought/Granted Permission sought to share information with : Family Supports Permission granted to share information with : Yes, Verbal Permission Granted  Share Information with NAME: sisters [name] and Manuela Schwartz, brother [name]           Emotional Assessment Appearance:: Appears stated age Attitude/Demeanor/Rapport: Engaged Affect (typically observed): Pleasant, Appropriate Orientation: : Oriented to Self, Oriented to Place, Oriented to  Time, Oriented to Situation      Admission diagnosis:  Atypical fracture of femur with nonunion [M84.750K] Closed disp comminuted fracture of shaft of left femur with nonunion [S72.352K] Patient Active Problem List   Diagnosis Date Noted   Atypical fracture of femur with nonunion 06/06/2022   Closed disp comminuted fracture of shaft of left femur with nonunion 06/06/2022   Closed subtrochanteric fracture of hip, left, initial encounter ([place]) 05/10/2021   Hypertension 05/10/2021   Mixed hyperlipidemia 05/10/2021   Nicotine dependence 05/10/2021   Hyponatremia 05/10/2021   PCP:  Leonides Sake, MD Pharmacy:   [place],  - [id number] [place] [address] Phone: [phone] Fax: [phone]     Social Determinants of Health (SDOH) Interventions    Readmission Risk Interventions     No data to display

## 2022-06-07 NOTE — Evaluation (Signed)
Occupational Therapy Evaluation Patient Details Name: Jonathon Anderson MRN: [id number] DOB: November 11, 1955 Today's Date: 06/07/2022   History of Present Illness Pt is a 65 year old man admitted on 11/14 /23 for ORIF of L femur due to non union. Original injury occured 10/22 with pt undergoing IMN at [facility]. PMH: HLD, HTN, smoker.   Clinical Impression   Pt most recently was ambulating with a cane and he was independent in self care. His sister has been assisting with IADLs. Pt currently requiring min assist for bed mobility and +2 mod assist for OOB with RW and close chair follow.  He has significant difficulty maintaining NWB on L LE and fatigues quickly with ambulation. Pt requires set up to total assist for ADLs. Attempted to help pt problem solve how he will manage stairs and ADLs at home. Pt repeatedly stating he managed when he originally fractured his femur with the help of his brother and sister and does not anticipate he will have any difficulty this time. Pt reports he was not NWB at that time. Recommending ST rehab in SNF, but pt insists he can manage at home. Will follow acutely.      Recommendations for follow up therapy are one component of a multi-disciplinary discharge planning process, led by the attending physician.  Recommendations may be updated based on patient status, additional functional criteria and insurance authorization.   Follow Up Recommendations  Skilled nursing-short term rehab (<3 hours/day) (pt currently refusing)     Assistance Recommended at Discharge Frequent or constant Supervision/Assistance  Patient can return home with the following Two people to help with walking and/or transfers;Assistance with cooking/housework;A lot of help with bathing/dressing/bathroom;Direct supervision/assist for medications management;Direct supervision/assist for financial management;Assist for transportation;Help with stairs or ramp for entrance    Functional Status Assessment   Patient has had a recent decline in their functional status and demonstrates the ability to make significant improvements in function in a reasonable and predictable amount of time.  Equipment Recommendations  Tub/shower bench;Wheelchair (measurements OT);Wheelchair cushion (measurements OT)    Recommendations for Other Services       Precautions / Restrictions Precautions Precautions: Fall Precaution Comments: wound vac Restrictions Weight Bearing Restrictions: Yes LLE Weight Bearing: Non weight bearing      Mobility Bed Mobility Overal bed mobility: Needs Assistance Bed Mobility: Supine to Sit     Supine to sit: Min assist     General bed mobility comments: HOB up, assist to manage L LE    Transfers Overall transfer level: Needs assistance Equipment used: Rolling walker (2 wheels) Transfers: Sit to/from Stand Sit to Stand: Mod assist, From elevated surface, +2 physical assistance           General transfer comment: assist for technique and to maintain NWB on L LE, assist to rise and steady as well as to control descent to chair      Balance Overall balance assessment: Needs assistance   Sitting balance-Leahy Scale: Good     Standing balance support: Bilateral upper extremity supported Standing balance-Leahy Scale: Poor Standing balance comment: heavy reliance on UEs and verbal cues to maintain NWB on L LE                           ADL either performed or assessed with clinical judgement   ADL Overall ADL's : Needs assistance/impaired Eating/Feeding: Independent;Sitting   Grooming: Set up;Sitting   Upper Body Bathing: Minimal assistance;Sitting   Lower Body  Bathing: Total assistance;Sit to/from stand;Sitting/lateral leans   Upper Body Dressing : Minimal assistance;Sitting   Lower Body Dressing: Total assistance;Sitting/lateral leans Lower Body Dressing Details (indicate cue type and reason): for R shoe Toilet Transfer: Moderate  assistance;Ambulation;Rolling walker (2 wheels);+2 for safety/equipment   Toileting- Clothing Manipulation and Hygiene: Sit to/from stand;Total assistance       Functional mobility during ADLs: Moderate assistance;Cueing for sequencing;Cueing for safety;+2 for safety/equipment;Rolling walker (2 wheels) General ADL Comments: Recommended pt use 3 in 1over toilet, repeatedly saying his nephew was going to install taller toilets at some point, redirected pt to immediate planning.     Vision Baseline Vision/History: 1 Wears glasses Ability to See in Adequate Light: 0 Adequate Patient Visual Report: No change from baseline       Perception     Praxis      Pertinent Vitals/Pain Pain Assessment Pain Assessment: Faces Faces Pain Scale: Hurts a little bit Pain Location: L hip Pain Descriptors / Indicators: Sore, Discomfort Pain Intervention(s): Repositioned, Monitored during session     Hand Dominance Right   Extremity/Trunk Assessment Upper Extremity Assessment Upper Extremity Assessment: Overall WFL for tasks assessed   Lower Extremity Assessment Lower Extremity Assessment: Defer to PT evaluation   Cervical / Trunk Assessment Cervical / Trunk Assessment: Normal   Communication Communication Communication: No difficulties   Cognition Arousal/Alertness: Awake/alert Behavior During Therapy: WFL for tasks assessed/performed Overall Cognitive Status: Impaired/Different from baseline Area of Impairment: Problem solving, Safety/judgement                         Safety/Judgement: Decreased awareness of deficits, Decreased awareness of safety   Problem Solving: Difficulty sequencing, Requires verbal cues General Comments: Pt with difficulty understanding limitations now that he is NWB on L LE, repeatedly talking about how he managed after his fall last October in which he was not NWB.     General Comments       Exercises     Shoulder Instructions      Home  Living Family/patient expects to be discharged to:: Private residence Living Arrangements: Other relatives (sister) Available Help at Discharge: Family;Available 24 hours/day Type of Home: Mobile home Home Access: Stairs to enter Entrance Stairs-Number of Steps: 6 Entrance Stairs-Rails: Right;Left Home Layout: One level     Bathroom Shower/Tub: Other (comment) (garden tub with step)   Bathroom Toilet: Standard Bathroom Accessibility: Yes How Accessible: Accessible via walker Home Equipment: Rolling Walker (2 wheels);Cane - single point;Hand held shower head;BSC/3in1;Grab bars - tub/shower          Prior Functioning/Environment Prior Level of Function : Needs assist;History of Falls (last six months)             Mobility Comments: walking with a cane ADLs Comments: assisted for cooking and cleaning by sister, sister also helped him on stairs        OT Problem List: Decreased strength;Impaired balance (sitting and/or standing);Decreased safety awareness;Decreased knowledge of use of DME or AE;Decreased knowledge of precautions;Pain      OT Treatment/Interventions: Self-care/ADL training;DME and/or AE instruction;Therapeutic activities;Visual/perceptual remediation/compensation;Patient/family education;Balance training    OT Goals(Current goals can be found in the care plan section) Acute Rehab OT Goals OT Goal Formulation: With patient Time For Goal Achievement: 06/21/22 Potential to Achieve Goals: Good  OT Frequency: Min 2X/week    Co-evaluation PT/OT/SLP Co-Evaluation/Treatment: Yes Reason for Co-Treatment: For patient/therapist safety   OT goals addressed during session: ADL's and self-care  AM-PAC OT "6 Clicks" Daily Activity     Outcome Measure Help from another person eating meals?: None Help from another person taking care of personal grooming?: A Little Help from another person toileting, which includes using toliet, bedpan, or urinal?: A Lot Help  from another person bathing (including washing, rinsing, drying)?: A Lot Help from another person to put on and taking off regular upper body clothing?: A Little Help from another person to put on and taking off regular lower body clothing?: Total 6 Click Score: 15   End of Session Equipment Utilized During Treatment: Rolling walker (2 wheels);Gait belt  Activity Tolerance: Patient tolerated treatment well Patient left: in chair;with call bell/phone within reach;with chair alarm set  OT Visit Diagnosis: Unsteadiness on feet (R26.81);Other abnormalities of gait and mobility (R26.89);Muscle weakness (generalized) (M62.81);History of falling (Z91.81);Other symptoms and signs involving cognitive function;Pain                Time: 3818-2993 OT Time Calculation (min): 26 min Charges:  OT General Charges $OT Visit: 1 Visit OT Evaluation $OT Eval Moderate Complexity: 1 Mod  Berna Spare, OTR/L Acute Rehabilitation Services Office: [phone]  Evern Bio 06/07/2022, 1:50 PM

## 2022-06-07 NOTE — Progress Notes (Addendum)
Orthopaedic Trauma Service Progress Note  Patient ID: Jonathon Anderson MRN: [id number] DOB/AGE: 08/04/1955 66 y.o.  Subjective:  Doing very well this am Pain better than pre-op  Sitting in chair No dizziness or lightheadedness when getting up   Lives with sister in trailer. About 5 stairs to gain entry   Tox screen unremarkable (collected after surgery)  Quit smoking about a week ago   No CP or SOB No N/V  Intra-op cultures show NGTD, no organisms noted on gram stain   ROS As above Objective:   VITALS:   Vitals:   06/06/22 2133 06/07/22 0002 06/07/22 0402 06/07/22 0808  BP: 124/78 115/75 118/63 126/71  Pulse: (!) 106 (!) 119 (!) 115 (!) 102  Resp:  18 18 16   Temp:  98.2 F (36.8 C) (!) 97.5 F (36.4 C) 97.8 F (36.6 C)  TempSrc:    Oral  SpO2:  99% 100% 100%  Weight:      Height:        Estimated body mass index is 31.77 kg/m as calculated from the following:   Height as of this encounter: 5' 8.5" (1.74 m).   Weight as of this encounter: 96.2 kg.   Intake/Output      11/14 0701 11/15 0700 11/15 0701 11/16 0700   P.O. 240    I.V. (mL/kg) 2150 (22.3)    IV Piggyback 850    Total Intake(mL/kg) 3240 (33.7)    Urine (mL/kg/hr) 785 (0.3) 225 (0.5)   Blood 900    Total Output 1685 225   Net +1555 -225          LABS  Results for orders placed or performed during the hospital encounter of 06/06/22 (from the past 24 hour(s))  Surgical pathology     Status: None   Collection Time: 06/06/22  1:05 PM  Result Value Ref Range   SURGICAL PATHOLOGY      SURGICAL PATHOLOGY CASE: 06/08/22 PATIENT: Jonathon Anderson Surgical Pathology Report     Clinical History: left hip fracture (cm)     FINAL MICROSCOPIC DIAGNOSIS:  A. LEFT HIP, SOFT TISSUE, EXCISION: Chronic fibrous synovitis with scattered macrophages with foreign body pigment Fragments of reactive and devitalized  trabecular and cortical bone with hyaline cartilage   GROSS DESCRIPTION:  The specimen is received fresh and consists of a 3.8 x 3.5 x 1.1 cm aggregate of tan-red soft tissue and possible bone fragments. Sectioning reveals tan-gray cut surfaces, with fragments of embedded possible metal.  Representative sections are submitted in 2 cassettes. 1 = soft tissue 2 = bone, following decalcification [id number] 06/06/2022)    Final Diagnosis performed by 06/08/2022, MD.   Electronically signed 06/07/2022 Technical and / or Professional components performed at [facility]. [facility], [address], [place], [place].  Immunohi stochemistry Technical component (if applicable) was performed at [facility]. [address], [address], [place], [place].   IMMUNOHISTOCHEMISTRY DISCLAIMER (if applicable): Some of these immunohistochemical stains may have been developed and the performance characteristics determine by [facility]. Some may not have been cleared or approved by the U.S. Food and Drug Administration. The FDA has determined that such clearance or approval is not necessary. This test is used for clinical purposes. It should not be regarded as investigational or  for research. This laboratory is certified under the Clinical Laboratory Improvement Amendments of 1988 (CLIA-88) as qualified to perform high complexity clinical laboratory testing.  The controls stained appropriately.   Rapid urine drug screen (hospital performed)     Status: Abnormal   Collection Time: 06/06/22  6:30 PM  Result Value Ref Range   Opiates POSITIVE (A) NONE DETECTED   Cocaine NONE DETECTED NONE DETECTED   Benzodiazepines POSITIVE (A) NONE DETECTED   Amphetamines NONE DETECTED NONE DETECTED   Tetrahydrocannabinol NONE DETECTED NONE DETECTED   Barbiturates NONE DETECTED NONE DETECTED  Osmolality, urine     Status: None   Collection Time: 06/06/22  6:30  PM  Result Value Ref Range   Osmolality, Ur 334 300 - 900 mOsm/kg  Basic metabolic panel     Status: Abnormal   Collection Time: 06/07/22  6:13 AM  Result Value Ref Range   Sodium 131 (L) 135 - 145 mmol/L   Potassium 3.7 3.5 - 5.1 mmol/L   Chloride 96 (L) 98 - 111 mmol/L   CO2 25 22 - 32 mmol/L   Glucose, Bld 128 (H) 70 - 99 mg/dL   BUN <5 (L) 8 - 23 mg/dL   Creatinine, Ser 0.48 0.61 - 1.24 mg/dL   Calcium 7.7 (L) 8.9 - 10.3 mg/dL   GFR, Estimated >88 >91 mL/min   Anion gap 10 5 - 15  Glucose, capillary     Status: Abnormal   Collection Time: 06/07/22  8:20 AM  Result Value Ref Range   Glucose-Capillary 124 (H) 70 - 99 mg/dL  CBC     Status: Abnormal   Collection Time: 06/07/22  8:24 AM  Result Value Ref Range   WBC 10.0 4.0 - 10.5 K/uL   RBC 2.23 (L) 4.22 - 5.81 MIL/uL   Hemoglobin 7.1 (L) 13.0 - 17.0 g/dL   HCT 69.4 (L) 50.3 - 88.8 %   MCV 95.5 80.0 - 100.0 fL   MCH 31.8 26.0 - 34.0 pg   MCHC 33.3 30.0 - 36.0 g/dL   RDW 28.0 03.4 - 91.7 %   Platelets 299 150 - 400 K/uL   nRBC 0.0 0.0 - 0.2 %  Glucose, capillary     Status: Abnormal   Collection Time: 06/07/22 11:24 AM  Result Value Ref Range   Glucose-Capillary 143 (H) 70 - 99 mg/dL    Latest Reference Range & Units 06/06/22 05:47  Vitamin D, 25-Hydroxy 30 - 100 ng/mL 40.77    PHYSICAL EXAM:   Gen: sitting up in chair, looks good, NAD, pleasant  Lungs: unlabored Cardiac: reg Ext:       Left Lower extremity   Prevena functioning well, good seal  DPN, SPN , TN sensation intact  EHL, FHL, lesser toe motor intact  Ankle flexion, extension, inversion and eversion intact  + Quad set   No DCT  Minimal swelling distally   + DP pulse     Assessment/Plan: 1 Day Post-Op   Principal Problem:   Atypical fracture of femur with nonunion Active Problems:   Hypertension   Nicotine dependence   Hyponatremia   Closed disp comminuted fracture of shaft of left femur with nonunion   Anti-infectives (From admission,  onward)    Start     Dose/Rate Route Frequency Ordered Stop   06/06/22 1800  ceFAZolin (ANCEF) IVPB 2g/100 mL premix        2 g 200 mL/hr over 30 Minutes Intravenous Every 6 hours 06/06/22 1548 06/07/22 0700  06/06/22 1107  vancomycin (VANCOCIN) powder  Status:  Discontinued          As needed 06/06/22 1107 06/06/22 1315   06/06/22 1106  tobramycin (NEBCIN) powder  Status:  Discontinued          As needed 06/06/22 1107 06/06/22 1315   06/06/22 0600  ceFAZolin (ANCEF) IVPB 2g/100 mL premix        2 g 200 mL/hr over 30 Minutes Intravenous On call to O.R. 06/06/22 0544 06/06/22 1217     .  POD/HD#: 14  66 year old male with left subtrochanteric femur nonunion   -Chronic left subtrochanteric femur nonunion s/p movable of hardware, plate osteosynthesis of nonunion with blade plate, antibiotic cement spacer placement  Touchdown weightbearing left leg using crutches or walker  Unrestricted range of motion left hip and knee  PT and OT evaluations  Prevena for 14 days   Ice and elevate for swelling and pain control   Possibly home tomorrow  - Pain management:  Multimodal  Good control thus far - ABL anemia/Hemodynamics  Acute blood loss anemia present due to surgery.  Recheck H&H this afternoon.  Patient agreeable to transfusion if clinically necessary  - Medical issues   Hyponatremia   Hypertensive medications on hold   Urine osmolality is normal   Check serum osmolality   Potassium levels improved  - DVT/PE prophylaxis:  Lovenox  Dc on DOAC  - ID:   Periop abx  - Metabolic Bone Disease:  Vitamin d levels look great  - Activity:  As above  - FEN/GI prophylaxis/Foley/Lines:  Reg diet  - Impediments to fracture healing:  Established nonunion   Nicotine use   - Dispo:  Therapy evals  Follow up on H/H  Possibly home tomorrow but more likely Friday      [name], PA-C [phone] (C) 06/07/2022, 11:42 AM  [facility] [address] [place] [address] [phone] Jonathon Anderson (F)    After 5pm and on the weekends please log on to Amion, go to orthopaedics and the look under the [facility] Call for the provider(s) on call. You can also call our office at [phone] and then follow the prompts to be connected to the call team.  Patient ID: Jonathon Anderson, male   DOB: [birth year], 66 y.o.   MRN: [id number]

## 2022-06-07 NOTE — Progress Notes (Signed)
Physical Therapy Evaluation Patient Details Name: Jonathon Anderson MRN: [id number] DOB: 12-22-1955 Today's Date: 06/07/2022  History of Present Illness  66 yo male admitted 11/14 with history of injury to L thigh had disruption of IM nailing to L femur from 2022, now has received bone graft and screws/plate for a non union.  PMHx:  HLD, HTN, smoker.  Clinical Impression  Pt was seen with assistance due to his issues of management of  NWB on LLE.  Pt is accustomed to using a SPC, and has been still requiring help with family for stairs and longer distances in the store.  Due to his difficulties with NWB and recent mobility issues, talked with pt about SNF stay.  He is committed to going home, but is not quite problem solving to get up 5-6 steps and to safely maintain NWB.  Follow up with him to get home and manage safety as best we can, but will still recommend SNF care due to the real risk of LLE repair being injured as well as his increased fall risk.  Follow up with all needs for the discharge destination but hopefully pt will agree to a care environment.       Recommendations for follow up therapy are one component of a multi-disciplinary discharge planning process, led by the attending physician.  Recommendations may be updated based on patient status, additional functional criteria and insurance authorization.  Follow Up Recommendations Skilled nursing-short term rehab (<3 hours/day) Can patient physically be transported by private vehicle: No    Assistance Recommended at Discharge Frequent or constant Supervision/Assistance  Patient can return home with the following  Two people to help with walking and/or transfers;A lot of help with bathing/dressing/bathroom;Assistance with cooking/housework;Direct supervision/assist for medications management;Direct supervision/assist for financial management;Assist for transportation;Help with stairs or ramp for entrance    Equipment Recommendations  Rolling walker (2 wheels);Wheelchair (measurements PT);Wheelchair cushion (measurements PT)  Recommendations for Other Services       Functional Status Assessment Patient has had a recent decline in their functional status and demonstrates the ability to make significant improvements in function in a reasonable and predictable amount of time.     Precautions / Restrictions Precautions Precautions: Fall Precaution Comments: wound vac Restrictions Weight Bearing Restrictions: Yes LLE Weight Bearing: Non weight bearing      Mobility  Bed Mobility Overal bed mobility: Needs Assistance Bed Mobility: Supine to Sit     Supine to sit: Min assist     General bed mobility comments: min assist to manage LLE    Transfers Overall transfer level: Needs assistance Equipment used: Rolling walker (2 wheels) Transfers: Sit to/from Stand Sit to Stand: Mod assist, +2 physical assistance, +2 safety/equipment, From elevated surface           General transfer comment: mod to assist power up and maintain NWB, otherwise begins to WB immediately    Ambulation/Gait Ambulation/Gait assistance: Mod assist, +2 physical assistance, +2 safety/equipment Gait Distance (Feet): 14 Feet Assistive device: Rolling walker (2 wheels), 2 person hand held assist   Gait velocity: reduced Gait velocity interpretation: <1.31 ft/sec, indicative of household ambulator Pre-gait activities: standing with cues for avoiding WB on LLE General Gait Details: Pt is struggling to keep LLE off the floor and PWB, requiring stop of gait after a few feet to sit due to starting to WB on LLE  Stairs            Wheelchair Mobility    Modified Rankin (Stroke Patients Only)  Balance Overall balance assessment: Needs assistance Sitting-balance support: Feet supported Sitting balance-Leahy Scale: Good     Standing balance support: Bilateral upper extremity supported Standing balance-Leahy Scale: Poor                                Pertinent Vitals/Pain Pain Assessment Pain Assessment: Faces Faces Pain Scale: Hurts a little bit Pain Location: L hip Pain Descriptors / Indicators: Discomfort, Guarding Pain Intervention(s): Limited activity within patient's tolerance, Monitored during session, Premedicated before session, Repositioned    Home Living Family/patient expects to be discharged to:: Private residence Living Arrangements: Other relatives (sister is home with him) Available Help at Discharge: Family;Available 24 hours/day Type of Home: Mobile home Home Access: Stairs to enter Entrance Stairs-Rails: Right;Left Entrance Stairs-Number of Steps: 6   Home Layout: One level Home Equipment: Agricultural consultant (2 wheels);Cane - single point;Hand held shower head;BSC/3in1;Grab bars - tub/shower      Prior Function Prior Level of Function : Needs assist;History of Falls (last six months)       Physical Assist : Mobility (physical) Mobility (physical): Gait   Mobility Comments: walking with a cane (driving and using shopping cart in store for LLE) ADLs Comments: assisted for cooking and cleaning by sister, sister also helped him on stairs     Hand Dominance   Dominant Hand: Right    Extremity/Trunk Assessment   Upper Extremity Assessment Upper Extremity Assessment: Overall WFL for tasks assessed    Lower Extremity Assessment Lower Extremity Assessment: Generalized weakness;LLE deficits/detail LLE Deficits / Details: pt cannot lift LLE off bed    Cervical / Trunk Assessment Cervical / Trunk Assessment: Normal  Communication   Communication: No difficulties  Cognition Arousal/Alertness: Awake/alert Behavior During Therapy: WFL for tasks assessed/performed Overall Cognitive Status: Impaired/Different from baseline Area of Impairment: Problem solving, Awareness, Safety/judgement, Following commands, Memory, Attention, Orientation                  Orientation Level: Situation Current Attention Level: Selective Memory: Decreased short-term memory, Decreased recall of precautions Following Commands: Follows one step commands with increased time Safety/Judgement: Decreased awareness of deficits, Decreased awareness of safety Awareness: Intellectual Problem Solving: Slow processing, Difficulty sequencing, Requires verbal cues, Requires tactile cues General Comments: pt is not differentiating his new NWB from previous functional level        General Comments General comments (skin integrity, edema, etc.): pt is unsafe to walk on RW without two person help currently, requiring assist to keep chair close, assist with WB support on RUE and dense cues for maintaining NWB on LLE    Exercises     Assessment/Plan    PT Assessment Patient needs continued PT services  PT Problem List Decreased strength;Decreased range of motion;Decreased activity tolerance;Decreased balance;Decreased mobility;Decreased coordination;Decreased cognition;Decreased knowledge of use of DME;Decreased safety awareness;Decreased skin integrity;Pain       PT Treatment Interventions DME instruction;Gait training;Functional mobility training;Therapeutic activities;Therapeutic exercise;Balance training;Neuromuscular re-education;Patient/family education;Stair training    PT Goals (Current goals can be found in the Care Plan section)  Acute Rehab PT Goals Patient Stated Goal: to go directly home PT Goal Formulation: With patient Time For Goal Achievement: 06/21/22 Potential to Achieve Goals: Fair    Frequency Min 5X/week     Co-evaluation               AM-PAC PT "6 Clicks" Mobility  Outcome Measure Help needed turning from your back to your  side while in a flat bed without using bedrails?: A Little Help needed moving from lying on your back to sitting on the side of a flat bed without using bedrails?: Total Help needed moving to and from a bed to a  chair (including a wheelchair)?: Total Help needed standing up from a chair using your arms (e.g., wheelchair or bedside chair)?: Total Help needed to walk in hospital room?: Total Help needed climbing 3-5 steps with a railing? : Total 6 Click Score: 8    End of Session Equipment Utilized During Treatment: Gait belt Activity Tolerance: Patient tolerated treatment well;Patient limited by fatigue;Patient limited by pain Patient left: in chair;with call bell/phone within reach;with chair alarm set Nurse Communication: Mobility status PT Visit Diagnosis: Unsteadiness on feet (R26.81);Muscle weakness (generalized) (M62.81);Pain Pain - Right/Left: Left Pain - part of body: Hip;Knee    Time: [id number] PT Time Calculation (min) (ACUTE ONLY): 30 min   Charges:   PT Evaluation $PT Eval Moderate Complexity: 1 Mod         Ramond Dial 06/07/2022, 3:47 PM  Mee Hives, PT PhD Acute Rehab Dept. Number: [facility] [place]

## 2022-06-08 LAB — BASIC METABOLIC PANEL
Anion gap: 5 (ref 5–15)
BUN: <5 mg/dL — ABNORMAL LOW (ref 8–23)
CO2: 25 mmol/L (ref 22–32)
Calcium: 7.5 mg/dL — ABNORMAL LOW (ref 8.9–10.3)
Chloride: 104 mmol/L (ref 98–111)
Creatinine, Ser: 0.59 mg/dL — ABNORMAL LOW (ref 0.61–1.24)
GFR, Estimated: >60 mL/min (ref >60)
Glucose, Bld: 130 mg/dL — ABNORMAL HIGH (ref 70–99)
Potassium: 4 mmol/L (ref 3.5–5.1)
Sodium: 134 mmol/L — ABNORMAL LOW (ref 135–145)

## 2022-06-08 LAB — CBC
HCT: 18 % — ABNORMAL LOW (ref 39.0–52.0)
Hemoglobin: 6 g/dL — CL (ref 13.0–17.0)
MCH: 31.9 pg (ref 26.0–34.0)
MCHC: 33.3 g/dL (ref 30.0–36.0)
MCV: 95.7 fL (ref 80.0–100.0)
Platelets: 266 10*3/uL (ref 150–400)
RBC: 1.88 MIL/uL — ABNORMAL LOW (ref 4.22–5.81)
RDW: 12.8 % (ref 11.5–15.5)
WBC: 7.3 10*3/uL (ref 4.0–10.5)
nRBC: 0 % (ref 0.0–0.2)

## 2022-06-08 LAB — PREPARE RBC (CROSSMATCH)

## 2022-06-08 LAB — HEMOGLOBIN AND HEMATOCRIT, BLOOD
HCT: 26.1 % — ABNORMAL LOW (ref 39.0–52.0)
Hemoglobin: 9.1 g/dL — ABNORMAL LOW (ref 13.0–17.0)

## 2022-06-08 LAB — OSMOLALITY: Osmolality: 276 mOsm/kg (ref 275–295)

## 2022-06-08 LAB — ALBUMIN: Albumin: 2.6 g/dL — ABNORMAL LOW (ref 3.5–5.0)

## 2022-06-08 LAB — PREALBUMIN: Prealbumin: 13 mg/dL — ABNORMAL LOW (ref 18–38)

## 2022-06-08 MED ORDER — FUROSEMIDE 10 MG/ML IJ SOLN
20.0000 mg | Freq: Once | INTRAMUSCULAR | Status: AC
  Administered 2022-06-08: 20 mg via INTRAVENOUS
  Filled 2022-06-08: qty 2

## 2022-06-08 MED ORDER — DIPHENHYDRAMINE HCL 25 MG PO CAPS
25.0000 mg | ORAL_CAPSULE | Freq: Once | ORAL | Status: AC
  Administered 2022-06-08: 25 mg via ORAL
  Filled 2022-06-08: qty 1

## 2022-06-08 MED ORDER — ACETAMINOPHEN 325 MG PO TABS: 650.0000 mg | ORAL_TABLET | Freq: Once | ORAL | Status: DC

## 2022-06-08 MED ORDER — SODIUM CHLORIDE 0.9% IV SOLUTION: Freq: Once | INTRAVENOUS | Status: AC

## 2022-06-08 NOTE — Progress Notes (Signed)
Date and time results received: 06/08/22 03:42 (use smartphrase ".now" to insert current time)  Test: hemoglobin Critical Value: 6.0  Name of Provider Notified: Sports Medicine On Call (after hours)  Orders Received? Or Actions Taken?: Awaiting for a call back from On Call Doctor

## 2022-06-08 NOTE — Progress Notes (Signed)
PT Cancellation Note  Patient Details Name: Jonathon Anderson MRN: [id number] DOB: 12-10-1955   Cancelled Treatment:    Reason Eval/Treat Not Completed: Patient not medically ready  Hgb 6.0 (has now agreed to transfusion) with resting HR 128 bpm. Will attempt to see post-transfusion.    Jerolyn Center, PT Acute Rehabilitation Services  Office [phone]  Zena Amos 06/08/2022, 10:36 AM

## 2022-06-08 NOTE — Progress Notes (Signed)
   06/08/22 0903  Assess: MEWS Score  Temp 99 F (37.2 C)  BP 122/67  MAP (mmHg) 84  Pulse Rate (!) 126  ECG Heart Rate (!) 128  Resp 18  Level of Consciousness Alert  SpO2 100 %  O2 Device Room Air  Assess: MEWS Score  MEWS Temp 0  MEWS Systolic 0  MEWS Pulse 2  MEWS RR 0  MEWS LOC 0  MEWS Score 2  MEWS Score Color Yellow  Assess: if the MEWS score is Yellow or Red  Were vital signs taken at a resting state? Yes  Focused Assessment No change from prior assessment  Does the patient meet 2 or more of the SIRS criteria? Yes  Does the patient have a confirmed or suspected source of infection? No  MEWS guidelines implemented *See Row Information* Yes  Treat  MEWS Interventions Administered scheduled meds/treatments  Take Vital Signs  Increase Vital Sign Frequency  Yellow: Q 2hr X 2 then Q 4hr X 2, if remains yellow, continue Q 4hrs  Escalate  MEWS: Escalate Yellow: discuss with charge nurse/RN and consider discussing with provider and RRT  Notify: Charge Nurse/RN  Name of Charge Nurse/RN Notified Lauren, RN  Date Charge Nurse/RN Notified 06/08/22  Time Charge Nurse/RN Notified 0915  Document  Patient Outcome Stabilized after interventions  Progress note created (see row info) Yes  Assess: SIRS CRITERIA  SIRS Temperature  0  SIRS Pulse 1  SIRS Respirations  0  SIRS WBC 1  SIRS Score Sum  2

## 2022-06-08 NOTE — Progress Notes (Addendum)
Date and time results received: 06/08/22 6:38am (use smartphrase ".now" to insert current time)  Test: Hemoglobin Critical Value: 6.0 (repeated test result)  Name of Provider Notified: Montez Morita PA-C  Orders Received? Or Actions Taken?: Pt refused to sign blood consent form; he wanted to talk to his sister first about this matter;Doc on Call notified & made aware.

## 2022-06-08 NOTE — Progress Notes (Signed)
Pt HR 120-130 - placed on cardiac monitor and EKG ordered.  Pt still wanting to speak with his sister prior to blood transfusion.  Spoke with pts sister via phone about blood transfusion.  She is on her way to the hospital and plan is to speak at bedside.

## 2022-06-08 NOTE — Progress Notes (Signed)
Orthopaedic Trauma Service Progress Note  Patient ID: Jonathon Anderson MRN: [id number] DOB/AGE: January 20, 1956 66 y.o.  Subjective:  Sitting up in bed without complaints  Hgb 6.0 Hct 18.0  HR elevated   Was not agreeable to transfusion earlier this am.  However he is now agreeable to PRBC transfusion  We discussed that not transfusing now could result in hypoperfusion/ischemic injury such as stroke or heart attack    ROS As above  Objective:   VITALS:   Vitals:   06/07/22 2148 06/08/22 0356 06/08/22 0800 06/08/22 0903  BP: 134/73 111/65 121/76 122/67  Pulse: 68 (!) 102 (!) 134 (!) 126  Resp: 17 18 18 18   Temp: 98.4 F (36.9 C) 98.7 F (37.1 C) 99.2 F (37.3 C) 99 F (37.2 C)  TempSrc: Oral Oral Oral   SpO2: 97% 100% 99% 100%  Weight:      Height:        Estimated body mass index is 31.77 kg/m as calculated from the following:   Height as of this encounter: 5' 8.5" (1.74 m).   Weight as of this encounter: 96.2 kg.   Intake/Output      11/15 0701 11/16 0700 11/16 0701 11/17 0700   P.O.     I.V. (mL/kg) 1861.5 (19.4) 500 (5.2)   IV Piggyback     Total Intake(mL/kg) 1861.5 (19.4) 500 (5.2)   Urine (mL/kg/hr) 775 (0.3)    Blood     Total Output 775    Net +1086.5 +500          LABS  Results for orders placed or performed during the hospital encounter of 06/06/22 (from the past 24 hour(s))  Glucose, capillary     Status: Abnormal   Collection Time: 06/07/22 11:24 AM  Result Value Ref Range   Glucose-Capillary 143 (H) 70 - 99 mg/dL  Hemoglobin and hematocrit, blood     Status: Abnormal   Collection Time: 06/07/22 12:44 PM  Result Value Ref Range   Hemoglobin 7.2 (L) 13.0 - 17.0 g/dL   HCT 06/09/22 (L) 44.0 - 10.2 %  Osmolality     Status: None   Collection Time: 06/07/22 12:44 PM  Result Value Ref Range   Osmolality 276 275 - 295 mOsm/kg  Basic metabolic panel     Status: Abnormal    Collection Time: 06/08/22  3:15 AM  Result Value Ref Range   Sodium 134 (L) 135 - 145 mmol/L   Potassium 4.0 3.5 - 5.1 mmol/L   Chloride 104 98 - 111 mmol/L   CO2 25 22 - 32 mmol/L   Glucose, Bld 130 (H) 70 - 99 mg/dL   BUN <5 (L) 8 - 23 mg/dL   Creatinine, Ser 06/10/22 (L) 0.61 - 1.24 mg/dL   Calcium 7.5 (L) 8.9 - 10.3 mg/dL   GFR, Estimated 3.66 >44 mL/min   Anion gap 5 5 - 15  CBC     Status: Abnormal   Collection Time: 06/08/22  5:47 AM  Result Value Ref Range   WBC 7.3 4.0 - 10.5 K/uL   RBC 1.88 (L) 4.22 - 5.81 MIL/uL   Hemoglobin 6.0 (LL) 13.0 - 17.0 g/dL   HCT 06/10/22 (L) 47.4 - 25.9 %   MCV 95.7 80.0 - 100.0 fL   MCH 31.9 26.0 - 34.0 pg  MCHC 33.3 30.0 - 36.0 g/dL   RDW 27.7 41.2 - 87.8 %   Platelets 266 150 - 400 K/uL   nRBC 0.0 0.0 - 0.2 %     PHYSICAL EXAM:   Gen: sitting up in bed, NAD, pleasant  Lungs: unlabored Cardiac: tachy but reg Ext:       Left Lower extremity              Prevena functioning well, good seal   No drainage in canister              DPN, SPN , TN sensation intact             EHL, FHL, lesser toe motor intact             Ankle flexion, extension, inversion and eversion intact             + Quad set              No DCT             Minimal swelling distally              + DP pulse  Assessment/Plan: 2 Days Post-Op   Principal Problem:   Atypical fracture of femur with nonunion Active Problems:   Hypertension   Nicotine dependence   Hyponatremia   Closed disp comminuted fracture of shaft of left femur with nonunion   Anti-infectives (From admission, onward)    Start     Dose/Rate Route Frequency Ordered Stop   06/06/22 1800  ceFAZolin (ANCEF) IVPB 2g/100 mL premix        2 g 200 mL/hr over 30 Minutes Intravenous Every 6 hours 06/06/22 1548 06/07/22 0700   06/06/22 1107  vancomycin (VANCOCIN) powder  Status:  Discontinued          As needed 06/06/22 1107 06/06/22 1315   06/06/22 1106  tobramycin (NEBCIN) powder  Status:   Discontinued          As needed 06/06/22 1107 06/06/22 1315   06/06/22 0600  ceFAZolin (ANCEF) IVPB 2g/100 mL premix        2 g 200 mL/hr over 30 Minutes Intravenous On call to O.R. 06/06/22 0544 06/06/22 1217     .  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, plate osteosynthesis of nonunion with blade plate, antibiotic cement spacer placement             Touchdown weightbearing left leg using crutches or walker             Unrestricted range of motion left hip and knee             PT and OT evaluations             Prevena for 14 days               Ice and elevate for swelling and pain control               Possibly home tomorrow   - Pain management:             Multimodal             Good control thus far  - ABL anemia/Hemodynamics             symptomatic acute blood loss anemia     Transfuse 2 units today  CBC in am    - Medical issues              Hyponatremia                         Hypertensive medications on hold                         Urine osmolality is normal                         serum osmolality normal    Sodium levels improving    Will decrease fluid rate as pt will be getting transfusion              Potassium levels improved   - DVT/PE prophylaxis:             hold lovenox today               Dc on DOAC   - ID:              Periop abx completed    - Metabolic Bone Disease:             Vitamin d levels look great   - Activity:             As above   - FEN/GI prophylaxis/Foley/Lines:             Reg diet   - Impediments to fracture healing:             Established nonunion              Nicotine use    - Dispo:             Therapies  Transfuse 2 units PRBCs today   Possibly home tomorrow      [name], PA-C [phone] (C) 06/08/2022, 10:21 AM  [facility] [address] [place]  [address] [phone] Val [phone] (F)    After 5pm and on the weekends please log on to Amion, go to orthopaedics and the look under the [facility] Call for the provider(s) on call. You can also call our office at [phone] and then follow the prompts to be connected to the call team.  Patient ID: Jonathon Anderson, male   DOB: September 26, 1955, 66 y.o.   MRN: [id number]

## 2022-06-08 NOTE — Progress Notes (Signed)
Pt refused to sign informed consent form for blood transfusion; he said he wanted to consult this matter first to his sister ; pt also said that if he gets blood ;he would likely get it from within his family circle.

## 2022-06-08 NOTE — Progress Notes (Signed)
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  PMHx:  HLD, HTN, smoker.    PT Comments    Patient had received one of two units of blood prior to activity. HR 129 at rest and increased to 154 bpm with ambulating 8 ft with standard walker and min assist for balance. Patient's HR very slow to come down to 130s and further ambulation deferred at this time. Discussed pt's refusal to go to SNF and concerns re: ability to ascend 6 steps to enter with NWB LLE. Patient reports he hopped up steps backwards after prior surgery and was NWB at that time (?accuracy). Hopeful HR will be controlled 11/17 and allow stair training. Also, may attempt RW for ambulation as pt with imbalance when advancing standard walker.    Recommendations for follow up therapy are one component of a multi-disciplinary discharge planning process, led by the attending physician.  Recommendations may be updated based on patient status, additional functional criteria and insurance authorization.  Follow Up Recommendations  Skilled nursing-short term rehab (<3 hours/day) (Pt refusing SNF although still seems to be safest solution; if pt chooses home, would recommend HHPT and transport via medical transport to assist up stairs) Can patient physically be transported by private vehicle: No   Assistance Recommended at Discharge Frequent or constant Supervision/Assistance  Patient can return home with the following Two people to help with walking and/or transfers;A lot of help with bathing/dressing/bathroom;Assistance with cooking/housework;Direct supervision/assist for medications management;Direct supervision/assist for financial management;Assist for  transportation;Help with stairs or ramp for entrance   Equipment Recommendations  Rolling walker (2 wheels);Wheelchair (measurements PT);Wheelchair cushion (measurements PT)    Recommendations for Other Services       Precautions / Restrictions Precautions Precautions: Fall Precaution Comments: wound vac Restrictions Weight Bearing Restrictions: Yes LLE Weight Bearing: Non weight bearing     Mobility  Bed Mobility Overal bed mobility: Needs Assistance Bed Mobility: Supine to Sit     Supine to sit: Min guard     General bed mobility comments: pt using bil UEs to help advance his LLE over EOB    Transfers Overall transfer level: Needs assistance Equipment used: Standard walker Transfers: Sit to/from Stand Sit to Stand: +2 physical assistance, Min assist           General transfer comment: able to keep LLE in NWB from bed at lowest height; repeated transfers deferred due to tachycardia    Ambulation/Gait Ambulation/Gait assistance: Mod assist, +2 safety/equipment Gait Distance (Feet): 8 Feet Assistive device: Standard walker Gait Pattern/deviations: Step-to pattern Gait velocity: reduced     General Gait Details: At 6 ft pt placed toes down lightly on floor; cued to lift foot off floor and next step he lightly put heel down. Chair brought to pt and returned to sitting while maintaining NWB; no additional ambulation due to tachycardia   Stairs             Wheelchair Mobility    Modified Rankin (Stroke Patients Only)       Balance Overall balance assessment: Needs assistance Sitting-balance support: Feet supported Sitting balance-Leahy Scale: Good     Standing balance support: Bilateral upper extremity supported Standing balance-Leahy Scale: Poor Standing balance comment: heavy reliance  on UEs and verbal cues to maintain NWB on L LE                            Cognition Arousal/Alertness: Awake/alert Behavior During Therapy: WFL  for tasks assessed/performed Overall Cognitive Status: No family/caregiver present to determine baseline cognitive functioning Area of Impairment: Awareness, Safety/judgement, Following commands                       Following Commands: Follows one step commands consistently Safety/Judgement: Decreased awareness of deficits, Decreased awareness of safety Awareness: Intellectual   General Comments: pt now reporting he was NWB LLE after previous surgery ?accuracy        Exercises      General Comments General comments (skin integrity, edema, etc.): Patient had received one unit of blood, however HR remained elevated at 129 bpm at rest. After ambulating HR 154 bpm and was very slow to come down during seated rest x 4 minutes (remained 130s).      Pertinent Vitals/Pain Pain Assessment Pain Assessment: 0-10 Pain Score: 5  Pain Location: L hip Pain Descriptors / Indicators: Discomfort, Guarding Pain Intervention(s): Limited activity within patient's tolerance, Monitored during session    Home Living                          Prior Function            PT Goals (current goals can now be found in the care plan section) Acute Rehab PT Goals Patient Stated Goal: to go directly home Time For Goal Achievement: 06/21/22 Potential to Achieve Goals: Fair Progress towards PT goals: Not progressing toward goals - comment (low Hgb and tachycardic)    Frequency    Min 5X/week      PT Plan Current plan remains appropriate (However pt refusing SNF)    Co-evaluation              AM-PAC PT "6 Clicks" Mobility   Outcome Measure  Help needed turning from your back to your side while in a flat bed without using bedrails?: A Little Help needed moving from lying on your back to sitting on the side of a flat bed without using bedrails?: A Little Help needed moving to and from a bed to a chair (including a wheelchair)?: A Lot Help needed standing up from a chair  using your arms (e.g., wheelchair or bedside chair)?: A Lot Help needed to walk in hospital room?: Total Help needed climbing 3-5 steps with a railing? : Total 6 Click Score: 12    End of Session Equipment Utilized During Treatment: Gait belt Activity Tolerance: Treatment limited secondary to medical complications (Comment);Patient limited by fatigue (tachycardia) Patient left: in chair;with call bell/phone within reach;with chair alarm set Nurse Communication: Mobility status PT Visit Diagnosis: Unsteadiness on feet (R26.81);Muscle weakness (generalized) (M62.81);Pain Pain - Right/Left: Left Pain - part of body: Hip;Knee     Time: 1537-1600 PT Time Calculation (min) (ACUTE ONLY): 23 min  Charges:  $Gait Training: 8-22 mins                      Jerolyn Center, PT Acute Rehabilitation Services  Office [phone]    Zena Amos 06/08/2022, 4:22 PM

## 2022-06-08 NOTE — Progress Notes (Signed)
OT Cancellation Note  Patient Details Name: Jonathon Anderson MRN: [id number] DOB: March 30, 1956   Cancelled Treatment:    Reason Eval/Treat Not Completed: Medical issues which prohibited therapy (Pt with resting HR 125-129 and Hgb of 6.0. Has not agreed to transfusion yet.)  Evern Bio 06/08/2022, 9:17 AM Berna Spare, OTR/L Acute Rehabilitation Services Office: [phone]

## 2022-06-08 NOTE — TOC Progression Note (Signed)
Transition of Care [facility]) - Progression Note    Patient Details  Name: Jonathon Anderson MRN: [id number] Date of Birth: [birth year]  Transition of Care [facility]) CM/SW Contact  Joanne Chars, LCSW Phone Number: 06/08/2022, 11:11 AM  Clinical Narrative:   CSW met with pt and sisters [name] and Manuela Schwartz in the room, discussed pt preference for [facility]. They are fully supportive of this, will provide any support pt needs at home.  Pt brother will help to transport him home and get up the steps and into the home.    Expected Discharge Plan: [place] Barriers to Discharge: Continued Medical Work up  Expected Discharge Plan and Services Expected Discharge Plan: [place] In-house Referral: Clinical Social Work   Post Acute Care Choice: [place] arrangements for the past 2 months: Single Family Home                                       Social Determinants of Health (SDOH) Interventions    Readmission Risk Interventions     No data to display

## 2022-06-08 NOTE — Care Management Important Message (Signed)
Important Message  Patient Details  Name: [name] MRN: [id number] Date of Birth: Sep 25, 1955   Medicare Important Message Given:  Yes     Sherilyn Banker 06/08/2022, 10:50 AM

## 2022-06-09 LAB — TYPE AND SCREEN
ABO/RH(D): O POS
Antibody Screen: NEGATIVE
Unit division: 0
Unit division: 0

## 2022-06-09 LAB — BPAM RBC
Blood Product Expiration Date: 202312112359
Blood Product Expiration Date: 202312122359
ISSUE DATE / TIME: 202311161111
ISSUE DATE / TIME: 202311161501
Unit Type and Rh: 5100
Unit Type and Rh: 5100

## 2022-06-09 LAB — BASIC METABOLIC PANEL
Anion gap: 9 (ref 5–15)
BUN: <5 mg/dL — ABNORMAL LOW (ref 8–23)
CO2: 26 mmol/L (ref 22–32)
Calcium: 7.7 mg/dL — ABNORMAL LOW (ref 8.9–10.3)
Chloride: 99 mmol/L (ref 98–111)
Creatinine, Ser: 0.55 mg/dL — ABNORMAL LOW (ref 0.61–1.24)
GFR, Estimated: >60 mL/min (ref >60)
Glucose, Bld: 106 mg/dL — ABNORMAL HIGH (ref 70–99)
Potassium: 3.6 mmol/L (ref 3.5–5.1)
Sodium: 134 mmol/L — ABNORMAL LOW (ref 135–145)

## 2022-06-09 LAB — CBC
HCT: 25.7 % — ABNORMAL LOW (ref 39.0–52.0)
Hemoglobin: 8.6 g/dL — ABNORMAL LOW (ref 13.0–17.0)
MCH: 31.3 pg (ref 26.0–34.0)
MCHC: 33.5 g/dL (ref 30.0–36.0)
MCV: 93.5 fL (ref 80.0–100.0)
Platelets: 282 10*3/uL (ref 150–400)
RBC: 2.75 MIL/uL — ABNORMAL LOW (ref 4.22–5.81)
RDW: 14.3 % (ref 11.5–15.5)
WBC: 8.3 10*3/uL (ref 4.0–10.5)
nRBC: 0 % (ref 0.0–0.2)

## 2022-06-09 MED ORDER — OXYCODONE-ACETAMINOPHEN 5-325 MG PO TABS: 1.0000 | ORAL_TABLET | Freq: Four times a day (QID) | ORAL | 0 refills | Status: DC | PRN

## 2022-06-09 MED ORDER — APIXABAN 2.5 MG PO TABS
2.5000 mg | ORAL_TABLET | Freq: Two times a day (BID) | ORAL | Status: DC
  Administered 2022-06-10: 2.5 mg via ORAL
  Filled 2022-06-09: qty 1

## 2022-06-09 MED ORDER — APIXABAN 2.5 MG PO TABS: 2.5000 mg | ORAL_TABLET | Freq: Two times a day (BID) | ORAL | 0 refills | Status: DC

## 2022-06-09 MED ORDER — METOPROLOL TARTRATE 50 MG PO TABS
100.0000 mg | ORAL_TABLET | Freq: Two times a day (BID) | ORAL | Status: DC
  Administered 2022-06-09 – 2022-06-10 (×3): 100 mg via ORAL
  Filled 2022-06-09 (×3): qty 2

## 2022-06-09 MED ORDER — ACETAMINOPHEN 500 MG PO TABS: 500.0000 mg | ORAL_TABLET | Freq: Two times a day (BID) | ORAL | 0 refills | Status: DC | PRN

## 2022-06-09 MED ORDER — DOCUSATE SODIUM 100 MG PO CAPS: 100.0000 mg | ORAL_CAPSULE | Freq: Two times a day (BID) | ORAL | 0 refills | Status: DC

## 2022-06-09 MED ORDER — CYCLOBENZAPRINE HCL 10 MG PO TABS: 10.0000 mg | ORAL_TABLET | Freq: Three times a day (TID) | ORAL | 1 refills | Status: DC | PRN

## 2022-06-09 NOTE — Discharge Instructions (Signed)
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  Activity as tolerated while maintaining weightbearing restrictions  Bone health: Continue with vitamin D supplementation.  Vitamin D levels look good  Review the following resource for additional information regarding bone health  [email or web address]  Wound Care: Wound care starting on 06/14/2022.  Please use the dressings that were provided for you at the hospital.  Once there is no drainage from your surgical sites you can leave open to the air and clean your wounds with soap and water.   Discharge Wound Care Instructions  Do NOT apply any ointments, solutions or lotions to pin sites or surgical wounds.  These prevent needed drainage and even though solutions like hydrogen peroxide kill bacteria, they also damage cells lining the pin sites that help fight infection.  Applying lotions or ointments can keep the wounds moist and can cause them to breakdown and open up as well. This can increase the risk for infection. When in doubt call the office.  Surgical incisions should be dressed daily.  If any drainage is noted, use one layer of adaptic or Mepitel, then gauze, and tape.  You currently have on an Aquacel Ag dressing.  You can either purchase more of these or purchase a silicone foam dressings instead of using Adaptic gauze and tape.  These can be purchased online or at a medical supply store or drugstore such as [facility] or  [facility]  NetCamper.czhttps://www.amazon.com/Johnson-Systagenix-ADAPTIC-Non-Adhering-Dressing/dp/B000ODY90A https://dennis-soto.com/https://www.amazon.com/Mepitel-[email or web address]-4x7-Each/dp/B01LMO5C6O/ref=pd_lpo_3?pd_rd_i=B01LMO5C6O&th=1  http://rojas.com/https://www.amazon.com/Silicone-Dressing-Border-Adhesive-Waterproof/dp/B07MNV4CQJ  These dressing supplies should be available at local medical supply stores ([facility], [facility], etc). They are not usually carried at places like [facility], [facility], [facility], etc  Once the incision is completely dry and without drainage, it may be left open to air out.  Showering may begin 36-48 hours later.  Cleaning gently with soap and water.   DVT/PE prophylaxis: Eliquis 2.5 mg every 12 hours for 30 days  Diet: as you were eating previously.  Can use over the counter stool softeners and bowel preparations, such as Miralax, to help with bowel movements.  Narcotics can be constipating.  Be sure to drink plenty of fluids  PAIN MEDICATION USE AND EXPECTATIONS  You have likely been given narcotic medications to help control your pain.  After a traumatic event that results in an fracture (broken bone) with or without surgery, it is ok to use narcotic pain medications to help control one's pain.  We understand that everyone responds to pain differently and each individual patient will be evaluated on a regular basis for the continued need for narcotic medications. Ideally, narcotic medication use should last no more than 6-8 weeks (coinciding with fracture healing).   As a patient it is your responsibility as well to monitor narcotic medication use and report the amount and frequency you use these medications when you come to your office visit.   We would also advise that if you are using narcotic medications, you should take a dose prior to therapy to maximize you participation.  IF YOU ARE ON NARCOTIC MEDICATIONS IT IS NOT PERMISSIBLE TO OPERATE A MOTOR VEHICLE (MOTORCYCLE/CAR/TRUCK/MOPED) OR HEAVY  MACHINERY DO NOT MIX NARCOTICS WITH OTHER CNS (CENTRAL NERVOUS SYSTEM) DEPRESSANTS SUCH AS  ALCOHOL   POST-OPERATIVE OPIOID TAPER INSTRUCTIONS: It is important to wean off of your opioid medication as soon as possible. If you do not need pain medication after your surgery it is ok to stop day one. Opioids include: Codeine, Hydrocodone(Norco, Vicodin), Oxycodone(Percocet, oxycontin) and hydromorphone amongst others.  Long term and even short term use of opiods can cause: Increased pain response Dependence Constipation Depression Respiratory depression And more.  Withdrawal symptoms can include Flu like symptoms Nausea, vomiting And more Techniques to manage these symptoms Hydrate well Eat regular healthy meals Stay active Use relaxation techniques(deep breathing, meditating, yoga) Do Not substitute Alcohol to help with tapering If you have been on opioids for less than two weeks and do not have pain than it is ok to stop all together.  Plan to wean off of opioids This plan should start within one week post op of your fracture surgery  Maintain the same interval or time between taking each dose and first decrease the dose.  Cut the total daily intake of opioids by one tablet each day Next start to increase the time between doses. The last dose that should be eliminated is the evening dose.    STOP SMOKING OR USING NICOTINE PRODUCTS!!!!  As discussed nicotine severely impairs your body's ability to heal surgical and traumatic wounds but also impairs bone healing.  Wounds and bone heal by forming microscopic blood vessels (angiogenesis) and nicotine is a vasoconstrictor (essentially, shrinks blood vessels).  Therefore, if vasoconstriction occurs to these microscopic blood vessels they essentially disappear and are unable to deliver necessary nutrients to the healing tissue.  This is one modifiable factor that you can do to dramatically increase your chances of healing your injury.     (This means no smoking, no nicotine gum, patches, etc)  DO NOT USE NONSTEROIDAL ANTI-INFLAMMATORY DRUGS (NSAID'S)  Using products such as Advil (ibuprofen), Aleve (naproxen), Motrin (ibuprofen) for additional pain control during fracture healing can delay and/or prevent the healing response.  If you would like to take over the counter (OTC) medication, Tylenol (acetaminophen) is ok.  However, some narcotic medications that are given for pain control contain acetaminophen as well. Therefore, you should not exceed more than 4000 mg of tylenol in a day if you do not have liver disease.  Also note that there are may OTC medicines, such as cold medicines and allergy medicines that my contain tylenol as well.  If you have any questions about medications and/or interactions please ask your doctor/PA or your pharmacist.      ICE AND ELEVATE INJURED/OPERATIVE EXTREMITY  Using ice and elevating the injured extremity above your heart can help with swelling and pain control.  Icing in a pulsatile fashion, such as 20 minutes on and 20 minutes off, can be followed.    Do not place ice directly on skin. Make sure there is a barrier between to skin and the ice pack.    Using frozen items such as frozen peas works well as the conform nicely to the are that needs to be iced.  USE AN ACE WRAP OR TED HOSE FOR SWELLING CONTROL  In addition to icing and elevation, Ace wraps or TED hose are used to help limit and resolve swelling.  It is recommended to use Ace wraps or TED hose until you are informed to stop.    When using Ace Wraps start the wrapping distally (farthest away from the body) and wrap proximally (closer to the body)   Example: If you  had surgery on your leg or thing and you do not have a splint on, start the ace wrap at the toes and work your way up to the thigh        If you had surgery on your upper extremity and do not have a splint on, start the ace wrap at your fingers and work your way up to the upper  arm  IF YOU ARE IN A SPLINT OR CAST DO NOT REMOVE IT FOR ANY REASON   If your splint gets wet for any reason please contact the office immediately. You may shower in your splint or cast as long as you keep it dry.  This can be done by wrapping in a cast cover or garbage back (or similar)  Do Not stick any thing down your splint or cast such as pencils, money, or hangers to try and scratch yourself with.  If you feel itchy take benadryl as prescribed on the bottle for itching  IF YOU ARE IN A CAM BOOT (BLACK BOOT)  You may remove boot periodically. Perform daily dressing changes as noted below.  Wash the liner of the boot regularly and wear a sock when wearing the boot. It is recommended that you sleep in the boot until told otherwise    Call office for the following: Temperature greater than 101F Persistent nausea and vomiting Severe uncontrolled pain Redness, tenderness, or signs of infection (pain, swelling, redness, odor or green/yellow discharge around the site) Difficulty breathing, headache or visual disturbances Hives Persistent dizziness or light-headedness Extreme fatigue Any other questions or concerns you may have after discharge  In an emergency, call 911 or go to an Emergency Department at a nearby hospital  HELPFUL INFORMATION  If you had a block, it will wear off between 8-24 hrs postop typically.  This is period when your pain may go from nearly zero to the pain you would have had postop without the block.  This is an abrupt transition but nothing dangerous is happening.  You may take an extra dose of narcotic when this happens.  You should wean off your narcotic medicines as soon as you are able.  Most patients will be off or using minimal narcotics before their first postop appointment.   We suggest you use the pain medication the first night prior to going to bed, in order to ease any pain when the anesthesia wears off. You should avoid taking pain medications on an  empty stomach as it will make you nauseous.  Do not drink alcoholic beverages or take illicit drugs when taking pain medications.  In most states it is against the law to drive while you are in a splint or sling.  And certainly against the law to drive while taking narcotics.  You may return to work/school in the next couple of days when you feel up to it.   Pain medication may make you constipated.  Below are a few solutions to try in this order: Decrease the amount of pain medication if you aren't having pain. Drink lots of decaffeinated fluids. Drink prune juice and/or each dried prunes  If the first 3 don't work start with additional solutions Take Colace - an over-the-counter stool softener Take Senokot - an over-the-counter laxative Take Miralax - a stronger over-the-counter laxative     CALL THE OFFICE WITH ANY QUESTIONS OR CONCERNS: [phone]   VISIT OUR WEBSITE FOR ADDITIONAL INFORMATION: [email or web address]   ============================================================================================== Information on my medicine - ELIQUIS (apixaban)  This  medication education was reviewed with me or my healthcare representative as part of my discharge preparation.    Why was Eliquis prescribed for you? Eliquis was prescribed for you to reduce the risk of blood clots forming after orthopedic surgery.    What do You need to know about Eliquis? Take your Eliquis TWICE DAILY - one tablet in the morning and one tablet in the evening with or without food.  It would be best to take the dose about the same time each day.  If you have difficulty swallowing the tablet whole please discuss with your pharmacist how to take the medication safely.  Take Eliquis exactly as prescribed by your doctor and DO NOT stop taking Eliquis without talking to the doctor who prescribed the medication.  Stopping without other medication to take the place of Eliquis may increase your  risk of developing a clot.  After discharge, you should have regular check-up appointments with your healthcare provider that is prescribing your Eliquis.  What do you do if you miss a dose? If a dose of ELIQUIS is not taken at the scheduled time, take it as soon as possible on the same day and twice-daily administration should be resumed.  The dose should not be doubled to make up for a missed dose.  Do not take more than one tablet of ELIQUIS at the same time.  Important Safety Information A possible side effect of Eliquis is bleeding. You should call your healthcare provider right away if you experience any of the following: Bleeding from an injury or your nose that does not stop. Unusual colored urine (red or dark brown) or unusual colored stools (red or black). Unusual bruising for unknown reasons. A serious fall or if you hit your head (even if there is no bleeding).  Some medicines may interact with Eliquis and might increase your risk of bleeding or clotting while on Eliquis. To help avoid this, consult your healthcare provider or pharmacist prior to using any new prescription or non-prescription medications, including herbals, vitamins, non-steroidal anti-inflammatory drugs (NSAIDs) and supplements.  This website has more information on Eliquis (apixaban): http://www.eliquis.com/eliquis/home

## 2022-06-09 NOTE — Progress Notes (Signed)
PT Cancellation Note  Patient Details Name: Jonathon Anderson MRN: [id number] DOB: August 24, 1955   Cancelled Treatment:    Reason Eval/Treat Not Completed: Other (comment).  Pt is declining to have PT right now, stating his OT told him he cannot have more therapy right now.  Tried to talk with him about this, pt is not willing to try to get to chair for now.  Retry as time and pt allow.   Ivar Drape 06/09/2022, 11:34 AM  Samul Dada, PT PhD Acute Rehab Dept. Number: [facility] [id number] and [facility] [phone]

## 2022-06-09 NOTE — Progress Notes (Signed)
[facility] Progress Note  Patient ID: Jonathon Anderson MRN: [id number] DOB/AGE: April 28, 1956 66 y.o.  Subjective:  Doing better  Feels better after 2 units PRBCs yesterday  Feels he will be ready to go home tomorrow  Good appetite No other complaints    ROS As above  Objective:   VITALS:   Vitals:   06/08/22 1727 06/08/22 2025 06/09/22 0347 06/09/22 0821  BP: (!) 117/92 133/78 130/82 (!) 102/90  Pulse: (!) 118 (!) 116 (!) 115 (!) 119  Resp: 19 16 17 17   Temp: 98.5 F (36.9 C) 98.1 F (36.7 C) 98.4 F (36.9 C) 98.8 F (37.1 C)  TempSrc: Axillary Oral Oral Oral  SpO2: 100%   96%  Weight:      Height:        Estimated body mass index is 31.77 kg/m as calculated from the following:   Height as of this encounter: 5' 8.5" (1.74 m).   Weight as of this encounter: 96.2 kg.   Intake/Output      11/16 0701 11/17 0700 11/17 0701 11/18 0700   P.O. 500    I.V. (mL/kg) 500 (5.2)    Blood 636.7    Total Intake(mL/kg) 1636.7 (17)    Urine (mL/kg/hr) 1310 (0.6)    Drains 0    Total Output 1310    Net +326.7           LABS  Results for orders placed or performed during the hospital encounter of 06/06/22 (from the past 24 hour(s))  Prepare RBC (crossmatch)     Status: None   Collection Time: 06/08/22 10:21 AM  Result Value Ref Range   Order Confirmation      ORDER PROCESSED BY BLOOD BANK Performed at [facility], [address], [place], [place]   Hemoglobin and hematocrit, blood     Status: Abnormal   Collection Time: 06/08/22  6:12 PM  Result Value Ref Range   Hemoglobin 9.1 (L) 13.0 - 17.0 g/dL   HCT 06/10/22 (L) 24.5 - 80.9 %  CBC     Status: Abnormal   Collection Time: 06/09/22  4:50 AM  Result Value Ref Range   WBC 8.3 4.0 - 10.5 K/uL   RBC 2.75 (L) 4.22 - 5.81 MIL/uL   Hemoglobin 8.6 (L) 13.0 - 17.0 g/dL   HCT 06/11/22 (L) 38.2 - 50.5 %   MCV 93.5  80.0 - 100.0 fL   MCH 31.3 26.0 - 34.0 pg   MCHC 33.5 30.0 - 36.0 g/dL   RDW 39.7 67.3 - 41.9 %   Platelets 282 150 - 400 K/uL   nRBC 0.0 0.0 - 0.2 %  Basic metabolic panel     Status: Abnormal   Collection Time: 06/09/22  4:50 AM  Result Value Ref Range   Sodium 134 (L) 135 - 145 mmol/L   Potassium 3.6 3.5 - 5.1 mmol/L   Chloride 99 98 - 111 mmol/L   CO2 26 22 - 32 mmol/L   Glucose, Bld 106 (H) 70 - 99 mg/dL   BUN <5 (L) 8 - 23 mg/dL   Creatinine, Ser 06/11/22 (L) 0.61 - 1.24 mg/dL   Calcium 7.7 (L) 8.9 - 10.3 mg/dL   GFR, Estimated 0.24 >09 mL/min   Anion gap 9 5 - 15  PHYSICAL EXAM:   Gen: sitting up in bed, NAD, pleasant, eating breakfast  Lungs: unlabored Cardiac: tachy but reg Ext:       Left Lower extremity              Prevena functioning well, good seal                         prevena removed    Incisions look great   No drainage   No signs of infection              DPN, SPN , TN sensation intact             EHL, FHL, lesser toe motor intact             Ankle flexion, extension, inversion and eversion intact             + Quad set              No DCT             Minimal swelling distally              + DP pulse  Assessment/Plan: 3 Days Post-Op     Anti-infectives (From admission, onward)    Start     Dose/Rate Route Frequency Ordered Stop   06/06/22 1800  ceFAZolin (ANCEF) IVPB 2g/100 mL premix        2 g 200 mL/hr over 30 Minutes Intravenous Every 6 hours 06/06/22 1548 06/07/22 0700   06/06/22 1107  vancomycin (VANCOCIN) powder  Status:  Discontinued          As needed 06/06/22 1107 06/06/22 1315   06/06/22 1106  tobramycin (NEBCIN) powder  Status:  Discontinued          As needed 06/06/22 1107 06/06/22 1315   06/06/22 0600  ceFAZolin (ANCEF) IVPB 2g/100 mL premix        2 g 200 mL/hr over 30 Minutes Intravenous On call to O.R. 06/06/22 0544 06/06/22 1217     .  POD/HD#: 88 66 year old male with left subtrochanteric femur nonunion    -Chronic  left subtrochanteric femur nonunion s/p movable of hardware, plate osteosynthesis of nonunion with blade plate, antibiotic cement spacer placement             Touchdown weightbearing left leg using crutches or walker             Unrestricted range of motion left hip and knee             PT and OT evaluations             Prevena removed   Aquacel ag dressing applied to the larger incision areas and a mepilex applied to proximal incision     Change dressing again in another 5 days               Ice and elevate for swelling and pain control               home tomorrow   - Pain management:             Multimodal   - ABL anemia/Hemodynamics             improved after 2 units PRBCs  Restart metoprolol    - Medical issues              Hyponatremia- improving   Continue to hold triamterene-HCTZ   -  DVT/PE prophylaxis:             start eliquis tomorrow    - ID:              Periop abx completed    - Metabolic Bone Disease:             Vitamin d levels look great   - Activity:             As above   - FEN/GI prophylaxis/Foley/Lines:             Reg diet   - Impediments to fracture healing:             Established nonunion              Nicotine use    - Dispo:             continue therapies   Dc home tomorrow    Pt still does not want snf   States sister and brother will help    [name], PA-C [phone] (C) 06/09/2022, 10:14 AM  [facility] [address] [place] [address] [phone] [phone] (F)    After 5pm and on the weekends please log on to Amion, go to orthopaedics and the look under the [facility] Call for the provider(s) on call. You can also call our office at [phone] and then follow the prompts to be connected to the call team.  Patient ID: Jonathon Anderson, male   DOB: 1956/07/02, 66 y.o.   MRN: [id number]

## 2022-06-09 NOTE — Discharge Summary (Signed)
Orthopaedic Trauma Service (OTS) Discharge Summary   Patient ID: Jonathon Anderson MRN: [id number] DOB/AGE: [birth year] 66 y.o.  Admit date: 06/06/2022 Discharge date: 06/09/2022  Admission Diagnoses: Left femur fracture nonunion Nicotine dependence Hypertension Hyponatremia  Discharge Diagnoses:  Principal Problem:   Atypical fracture of femur with nonunion Active Problems:   Hypertension   Nicotine dependence   Hyponatremia   Closed disp comminuted fracture of shaft of left femur with nonunion   Past Medical History:  Diagnosis Date   Hyperlipidemia    Hypertension    Nicotine dependence      Procedures Performed: 06/06/2022- Dr Carola Frost 1.  Repair of left femoral nonunion using Infuse allograft. 2.  Open reduction internal fixation of left subtrochanteric femur with a 95 degree Synthes blade plate. 3.  Removal of deep implant, left femur, complicated. 4.  Insertion of antibiotic cement spacer. 5.  Application of a small wound vac.  Discharged Condition: good  Hospital Course:   Patient is a 66 year old male who underwent intramedullary nailing of of his left hip October 2022 at [facility].  He has been followed by orthopedics in [place] and has developed a nonunion with screw cut out.  He was referred to the orthopedic trauma service for definitive management. Decision was made for surgery.  He was taken to the OR on 06/06/2022 for the procedure noted above.  Patient tolerated the procedure well.  His hospital stay was really uncomplicated.  He progressed well with therapies over the next several days.  He did experience acute blood loss anemia and was transfused on postoperative day #2 with 2 units and had good result with this.  He felt much better after receiving his transfusion.  Intraoperative cultures did not grow out any bacteria  Patient participated well with therapies and on postoperative day #4 he was deemed stable for  discharge to home.  He was covered with Ancef for perioperative antibiosis as well as Lovenox for DVT and PE prophylaxis.  He will be discharged on Eliquis for the next 30 days for DVT and PE prophylaxis as well  Also given his hyponatremia on admission we did hold his triamterene and hydrochlorothiazide.  He did have improvement in his electrolytes with this.  Consults: None  Significant Diagnostic Studies: labs:   Latest Reference Range & Units 06/09/22 04:50 06/10/22 02:47  Sodium 135 - 145 mmol/L 134 (L) 132 (L)  Potassium 3.5 - 5.1 mmol/L 3.6 3.6  Chloride 98 - 111 mmol/L 99 96 (L)  CO2 22 - 32 mmol/L 26 28  Glucose 70 - 99 mg/dL 226 (H) 333 (H)  BUN 8 - 23 mg/dL <5 (L) <5 (L)  Creatinine 0.61 - 1.24 mg/dL 5.45 (L) 6.25  Calcium 8.9 - 10.3 mg/dL 7.7 (L) 8.0 (L)  Anion gap 5 - 15  9 8   GFR, Estimated >60 mL/min >60 >60  WBC 4.0 - 10.5 K/uL 8.3 8.1  RBC 4.22 - 5.81 MIL/uL 2.75 (L) 2.71 (L)  Hemoglobin 13.0 - 17.0 g/dL 8.6 (L) 8.6 (L)  HCT - 52.0 % 25.7 (L) 25.2 (L)  MCV 80.0 - 100.0 fL 93.5 93.0  MCH 26.0 - 34.0 pg 31.3 31.7  MCHC 30.0 - 36.0 g/dL 63.8 93.7  RDW 34.2 - 87.6 % 14.3 13.8  Platelets 150 - 400 K/uL 282 319  nRBC 0.0 - 0.2 % 0.0 0.0  (L): Data is abnormally low (H): Data is abnormally high    Latest Reference Range & Units 06/06/22 05:47  Vitamin D, 25-Hydroxy 30 - 100 ng/mL 40.77    Treatments: IV hydration, antibiotics: Ancef, analgesia: acetaminophen and oxycodone, anticoagulation: Lovenox while inpatient and Eliquis at discharge, therapies: PT, OT, and RN, and surgery: As above  Discharge Exam:     Orthopaedic Trauma Service Progress Note   Patient ID: Jonathon Anderson MRN: [id number] DOB/AGE: March 22, 1956 66 y.o.   Subjective:   Doing better  Hemoglobin stable 8.6 unchanged from yesterday. Patient feels ready to go home today. No issues overnight.     ROS As above   Objective:    VITALS:         Vitals:    06/09/22 0821 06/09/22  1456 06/09/22 2004 06/10/22 0411  BP: (!) 102/90 113/71 106/71 125/68  Pulse: (!) 119 83 92 85  Resp: Temp: 98.8 F (37.1 C) 98.3 F (36.8 C) 97.8 F (36.6 C) 97.6 F (36.4 C)  TempSrc: Oral Oral   Oral  SpO2: 96% 99% 98% 100%  Weight:          Height:              Estimated body mass index is 31.77 kg/m as calculated from the following:   Height as of this encounter: 5' 8.5" (1.74 m).   Weight as of this encounter: 96.2 kg.     Intake/Output      11/17 0701 11/18 0700 11/18 0701 11/19 0700   P.O. 450    I.V. (mL/kg)     Blood     Total Intake(mL/kg) 450 (4.7)    Urine (mL/kg/hr) 1300 (0.6)    Drains     Total Output 1300    Net -850            LABS   Lab Results Last 24 Hours       Results for orders placed or performed during the hospital encounter of 06/06/22 (from the past 24 hour(s))  CBC     Status: Abnormal    Collection Time: 06/10/22  2:47 AM  Result Value Ref Range    WBC 8.1 4.0 - 10.5 K/uL    RBC 2.71 (L) 4.22 - 5.81 MIL/uL    Hemoglobin 8.6 (L) 13.0 - 17.0 g/dL    HCT 40.9 (L) 81.1 - 52.0 %    MCV 93.0 80.0 - 100.0 fL    MCH 31.7 26.0 - 34.0 pg    MCHC 34.1 30.0 - 36.0 g/dL    RDW 91.4 78.2 - 95.6 %    Platelets 319 150 - 400 K/uL    nRBC 0.0 0.0 - 0.2 %  Basic metabolic panel     Status: Abnormal    Collection Time: 06/10/22  2:47 AM  Result Value Ref Range    Sodium 132 (L) 135 - 145 mmol/L    Potassium 3.6 3.5 - 5.1 mmol/L    Chloride 96 (L) 98 - 111 mmol/L    CO2 28 22 - 32 mmol/L    Glucose, Bld 109 (H) 70 - 99 mg/dL    BUN <5 (L) 8 - 23 mg/dL    Creatinine, Ser 2.13 0.61 - 1.24 mg/dL    Calcium 8.0 (L) 8.9 - 10.3 mg/dL    GFR, Estimated >08 >65 mL/min    Anion gap 8 5 - 15          PHYSICAL EXAM:    Gen: sitting up in bed, NAD, pleasant, eating breakfast  Lungs: unlabored Cardiac: tachy but reg Ext:  Left Lower extremity              Prevena functioning well, good seal                         prevena  removed                          Incisions look great                         No drainage                         No signs of infection              DPN, SPN , TN sensation intact             EHL, FHL, lesser toe motor intact             Ankle flexion, extension, inversion and eversion intact             + Quad set              No DCT             Minimal swelling distally              + DP pulse   Assessment/Plan: 4 Days Post-Op        Anti-infectives (From admission, onward)        Start     Dose/Rate Route Frequency Ordered Stop    06/06/22 1800   ceFAZolin (ANCEF) IVPB 2g/100 mL premix        2 g 200 mL/hr over 30 Minutes Intravenous Every 6 hours 06/06/22 1548 06/07/22 0700    06/06/22 1107   vancomycin (VANCOCIN) powder  Status:  Discontinued            As needed 06/06/22 1107 06/06/22 1315    06/06/22 1106   tobramycin (NEBCIN) powder  Status:  Discontinued            As needed 06/06/22 1107 06/06/22 1315    06/06/22 0600   ceFAZolin (ANCEF) IVPB 2g/100 mL premix        2 g 200 mL/hr over 30 Minutes Intravenous On call to O.R. 06/06/22 0544 06/06/22 1217         .   POD/HD#: 413 66 year old male with left subtrochanteric femur nonunion    -Chronic left subtrochanteric femur nonunion s/p movable of hardware, plate osteosynthesis of nonunion with blade plate, antibiotic cement spacer placement             Touchdown weightbearing left leg using crutches or walker             Unrestricted range of motion left hip and knee             PT and OT evaluations             Prevena removed                         Aquacel ag dressing applied to the larger incision areas and a mepilex applied to proximal incision                            Change dressing again in another 5  days               Ice and elevate for swelling and pain control               home    - Pain management:             Multimodal   - ABL anemia/Hemodynamics  Stable   - Medical issues               Hyponatremia- improving    - DVT/PE prophylaxis:          Eliquis   - ID:              Periop abx completed    - Metabolic Bone Disease:             Vitamin d levels look great   - Activity:             As above   - FEN/GI prophylaxis/Foley/Lines:             Reg diet   - Impediments to fracture healing:             Established nonunion              Nicotine use    - Dispo:             continue therapies              Dc home   Disposition: Discharge disposition: 01-Home or Self Care       Discharge Instructions     Call MD / Call 911   Complete by: As directed    If you experience chest pain or shortness of breath, CALL 911 and be transported to the hospital emergency room.  If you develope a fever above 101 F, pus (white drainage) or increased drainage or redness at the wound, or calf pain, call your surgeon's office.   Constipation Prevention   Complete by: As directed    Drink plenty of fluids.  Prune juice may be helpful.  You may use a stool softener, such as Colace (over the counter) 100 mg twice a day.  Use MiraLax (over the counter) for constipation as needed.   DO NOT drive, shower or take a tub bath until instructed by your physician   Complete by: As directed    Diet - low sodium heart healthy   Complete by: As directed    Discharge instructions   Complete by: As directed    Orthopaedic Trauma Service Discharge Instructions   General Discharge Instructions  Orthopaedic Injuries:  Left femur nonunion treated with removal of hardware, open reduction and internal fixation using plate and screws and placement of antibiotic cement spacer   WEIGHT BEARING STATUS: touch down weightbearing left leg using crutches or walker   RANGE OF MOTION/ACTIVITY: unrestricted motion of left hip and knee.  Activity as tolerated while maintaining weightbearing restrictions  Bone health: Continue with vitamin D supplementation.  Vitamin D levels look good  Review the  following resource for additional information regarding bone health  [email or web address].cy  Wound Care: Wound care starting on 06/14/2022.  Please use the dressings that were provided for you at the hospital.  Once there is no drainage from your surgical sites you can leave open to the air and clean your wounds with soap and water.   Discharge Wound Care Instructions  Do NOT apply any ointments, solutions or lotions to pin sites or surgical  wounds.  These prevent needed drainage and even though solutions like hydrogen peroxide kill bacteria, they also damage cells lining the pin sites that help fight infection.  Applying lotions or ointments can keep the wounds moist and can cause them to breakdown and open up as well. This can increase the risk for infection. When in doubt call the office.  Surgical incisions should be dressed daily.  If any drainage is noted, use one layer of adaptic or Mepitel, then gauze, and tape.  You currently have on an Aquacel Ag dressing.  You can either purchase more of these or purchase a silicone foam dressings instead of using Adaptic gauze and tape.  These can be purchased online or at a medical supply store or drugstore such as [facility] or [facility]  NetCamper[employer]cz [email or web address]-[email or web address]/?pd_rd_[email or web address]&[email or web address]  [email or web address].[email or web address]  These dressing supplies should be available at local medical supply stores ([employer], [employer], etc). They are not usually carried at places like [facility], [facility], [facility], etc  Once the incision is completely dry and without drainage, it may be left open to air out.  Showering may begin 36-48 hours later.  Cleaning gently with soap and water.   DVT/PE prophylaxis: Eliquis 2.5 mg every 12 hours for 30 days  Diet:  as you were eating previously.  Can use over the counter stool softeners and bowel preparations, such as Miralax, to help with bowel movements.  Narcotics can be constipating.  Be sure to drink plenty of fluids  PAIN MEDICATION USE AND EXPECTATIONS  You have likely been given narcotic medications to help control your pain.  After a traumatic event that results in an fracture (broken bone) with or without surgery, it is ok to use narcotic pain medications to help control one's pain.  We understand that everyone responds to pain differently and each individual patient will be evaluated on a regular basis for the continued need for narcotic medications. Ideally, narcotic medication use should last no more than 6-8 weeks (coinciding with fracture healing).   As a patient it is your responsibility as well to monitor narcotic medication use and report the amount and frequency you use these medications when you come to your office visit.   We would also advise that if you are using narcotic medications, you should take a dose prior to therapy to maximize you participation.  IF YOU ARE ON NARCOTIC MEDICATIONS IT IS NOT PERMISSIBLE TO OPERATE A MOTOR VEHICLE (MOTORCYCLE/CAR/TRUCK/MOPED) OR HEAVY MACHINERY DO NOT MIX NARCOTICS WITH OTHER CNS (CENTRAL NERVOUS SYSTEM) DEPRESSANTS SUCH AS ALCOHOL   POST-OPERATIVE OPIOID TAPER INSTRUCTIONS:  It is important to wean off of your opioid medication as soon as possible. If you do not need pain medication after your surgery it is ok to stop day one.  Opioids include:  o Codeine, Hydrocodone(Norco, Vicodin), Oxycodone(Percocet, oxycontin) and hydromorphone amongst others.   Long term and even short term use of opiods can cause:  o Increased pain response  o Dependence  o Constipation  o Depression  o Respiratory depression  o And more.   Withdrawal symptoms can include  o Flu like symptoms  o Nausea, vomiting  o And more  Techniques to manage these  symptoms  o Hydrate well  o Eat regular healthy meals  o Stay active  o Use relaxation techniques(deep breathing, meditating, yoga)  Do Not substitute Alcohol to help with tapering  If you have been on opioids for less than two weeks and do not have pain than it is ok  to stop all together.   Plan to wean off of opioids  o This plan should start within one week post op of your fracture surgery   o Maintain the same interval or time between taking each dose and first decrease the dose.   o Cut the total daily intake of opioids by one tablet each day  o Next start to increase the time between doses.  o The last dose that should be eliminated is the evening dose.    STOP SMOKING OR USING NICOTINE PRODUCTS!!!!  As discussed nicotine severely impairs your body's ability to heal surgical and traumatic wounds but also impairs bone healing.  Wounds and bone heal by forming microscopic blood vessels (angiogenesis) and nicotine is a vasoconstrictor (essentially, shrinks blood vessels).  Therefore, if vasoconstriction occurs to these microscopic blood vessels they essentially disappear and are unable to deliver necessary nutrients to the healing tissue.  This is one modifiable factor that you can do to dramatically increase your chances of healing your injury.    (This means no smoking, no nicotine gum, patches, etc)  DO NOT USE NONSTEROIDAL ANTI-INFLAMMATORY DRUGS (NSAID'S)  Using products such as Advil (ibuprofen), Aleve (naproxen), Motrin (ibuprofen) for additional pain control during fracture healing can delay and/or prevent the healing response.  If you would like to take over the counter (OTC) medication, Tylenol (acetaminophen) is ok.  However, some narcotic medications that are given for pain control contain acetaminophen as well. Therefore, you should not exceed more than 4000 mg of tylenol in a day if you do not have liver disease.  Also note that there are may OTC medicines, such as cold  medicines and allergy medicines that my contain tylenol as well.  If you have any questions about medications and/or interactions please ask your doctor/PA or your pharmacist.      ICE AND ELEVATE INJURED/OPERATIVE EXTREMITY  Using ice and elevating the injured extremity above your heart can help with swelling and pain control.  Icing in a pulsatile fashion, such as 20 minutes on and 20 minutes off, can be followed.    Do not place ice directly on skin. Make sure there is a barrier between to skin and the ice pack.    Using frozen items such as frozen peas works well as the conform nicely to the are that needs to be iced.  USE AN ACE WRAP OR TED HOSE FOR SWELLING CONTROL  In addition to icing and elevation, Ace wraps or TED hose are used to help limit and resolve swelling.  It is recommended to use Ace wraps or TED hose until you are informed to stop.    When using Ace Wraps start the wrapping distally (farthest away from the body) and wrap proximally (closer to the body)   Example: If you had surgery on your leg or thing and you do not have a splint on, start the ace wrap at the toes and work your way up to the thigh        If you had surgery on your upper extremity and do not have a splint on, start the ace wrap at your fingers and work your way up to the upper arm  IF YOU ARE IN A SPLINT OR CAST DO NOT REMOVE IT FOR ANY REASON   If your splint gets wet for any reason please contact the office immediately. You may shower in your splint or cast as long as you keep it dry.  This can be done  by wrapping in a cast cover or garbage back (or similar)  Do Not stick any thing down your splint or cast such as pencils, money, or hangers to try and scratch yourself with.  If you feel itchy take benadryl as prescribed on the bottle for itching  IF YOU ARE IN A CAM BOOT (BLACK BOOT)  You may remove boot periodically. Perform daily dressing changes as noted below.  Wash the liner of the boot regularly and  wear a sock when wearing the boot. It is recommended that you sleep in the boot until told otherwise    Call office for the following: ? Temperature greater than 101F ? Persistent nausea and vomiting ? Severe uncontrolled pain ? Redness, tenderness, or signs of infection (pain, swelling, redness, odor or green/yellow discharge around the site) ? Difficulty breathing, headache or visual disturbances ? Hives ? Persistent dizziness or light-headedness ? Extreme fatigue ? Any other questions or concerns you may have after discharge  In an emergency, call 911 or go to an Emergency Department at a nearby hospital  HELPFUL INFORMATION  ? If you had a block, it will wear off between 8-24 hrs postop typically.  This is period when your pain may go from nearly zero to the pain you would have had postop without the block.  This is an abrupt transition but nothing dangerous is happening.  You may take an extra dose of narcotic when this happens.  ? You should wean off your narcotic medicines as soon as you are able.  Most patients will be off or using minimal narcotics before their first postop appointment.   ? We suggest you use the pain medication the first night prior to going to bed, in order to ease any pain when the anesthesia wears off. You should avoid taking pain medications on an empty stomach as it will make you nauseous.  ? Do not drink alcoholic beverages or take illicit drugs when taking pain medications.  ? In most states it is against the law to drive while you are in a splint or sling.  And certainly against the law to drive while taking narcotics.  ? You may return to work/school in the next couple of days when you feel up to it.   ? Pain medication may make you constipated.  Below are a few solutions to try in this order:   ? Decrease the amount of pain medication if you aren't having pain.   ? Drink lots of decaffeinated fluids.   ? Drink prune juice and/or each dried  prunes   o If the first 3 don't work start with additional solutions   ? Take Colace - an over-the-counter stool softener   ? Take Senokot - an over-the-counter laxative   ? Take Miralax - a stronger over-the-counter laxative     CALL THE OFFICE WITH ANY QUESTIONS OR CONCERNS: [phone]   VISIT OUR WEBSITE FOR ADDITIONAL INFORMATION: [email or web address]   Discharge wound care:   Complete by: As directed    If you have a hip bandage, keep it clean and dry.  Change your bandage as instructed by your health care providers.  If your bandage has been discontinued, keep your incision clean and dry.  Pat dry after bathing.  DO NOT put lotion or powder on your incision.   Do not sit on low chairs, stoools or toilet seats, as it may be difficult to get up from low surfaces   Complete by: As directed  Driving restrictions   Complete by: As directed    No driving until further notice   Increase activity slowly as tolerated   Complete by: As directed    Post-operative opioid taper instructions:   Complete by: As directed    POST-OPERATIVE OPIOID TAPER INSTRUCTIONS: It is important to wean off of your opioid medication as soon as possible. If you do not need pain medication after your surgery it is ok to stop day one. Opioids include: Codeine, Hydrocodone(Norco, Vicodin), Oxycodone(Percocet, oxycontin) and hydromorphone amongst others.  Long term and even short term use of opiods can cause: Increased pain response Dependence Constipation Depression Respiratory depression And more.  Withdrawal symptoms can include Flu like symptoms Nausea, vomiting And more Techniques to manage these symptoms Hydrate well Eat regular healthy meals Stay active Use relaxation techniques(deep breathing, meditating, yoga) Do Not substitute Alcohol to help with tapering If you have been on opioids for less than two weeks and do not have pain than it is ok to stop all together.  Plan to wean off of  opioids This plan should start within one week post op of your joint replacement. Maintain the same interval or time between taking each dose and first decrease the dose.  Cut the total daily intake of opioids by one tablet each day Next start to increase the time between doses. The last dose that should be eliminated is the evening dose.      Touch down weight bearing   Complete by: As directed    Laterality: left   Extremity: Lower      Allergies as of 06/09/2022   No Known Allergies      Medication List     STOP taking these medications    acetaminophen 650 MG CR tablet Commonly known as: TYLENOL Replaced by: acetaminophen 500 MG tablet   diclofenac 75 MG EC tablet Commonly known as: VOLTAREN       TAKE these medications    acetaminophen 500 MG tablet Commonly known as: TYLENOL Take 1 tablet (500 mg total) by mouth every 12 (twelve) hours as needed for mild pain. Replaces: acetaminophen 650 MG CR tablet   apixaban 2.5 MG Tabs tablet Commonly known as: ELIQUIS Take 1 tablet (2.5 mg total) by mouth 2 (two) times daily. Start taking on: June 10, 2022   aspirin EC 81 MG tablet Take 81 mg by mouth at bedtime. Swallow whole.   atorvastatin 20 MG tablet Commonly known as: LIPITOR Take 20 mg by mouth at bedtime.   CALCIUM 600+D3 PO Take 1 tablet by mouth 2 (two) times daily.   cyclobenzaprine 10 MG tablet Commonly known as: FLEXERIL Take 1 tablet (10 mg total) by mouth 3 (three) times daily as needed for muscle spasms. What changed: when to take this   docusate sodium 100 MG capsule Commonly known as: COLACE Take 1 capsule (100 mg total) by mouth 2 (two) times daily.   Fish Oil 1200 MG Caps Take 1,200 mg by mouth 2 (two) times daily.   metoprolol tartrate 100 MG tablet Commonly known as: LOPRESSOR Take 100 mg by mouth 2 (two) times daily.   oxyCODONE-acetaminophen 5-325 MG tablet Commonly known as: PERCOCET/ROXICET Take 1-2 tablets by mouth  every 6 (six) hours as needed for severe pain or moderate pain.   thiamine 100 MG tablet Commonly known as: VITAMIN B1 Take 1 tablet (100 mg total) by mouth daily.   triamterene-hydrochlorothiazide 75-50 MG tablet Commonly known as: MAXZIDE Take 1 tablet by mouth  every morning.               Discharge Care Instructions  (From admission, onward)           Start     Ordered   06/09/22 0000  Touch down weight bearing       Question Answer Comment  Laterality left   Extremity Lower      06/09/22 1114   06/09/22 0000  Discharge wound care:       Comments: If you have a hip bandage, keep it clean and dry.  Change your bandage as instructed by your health care providers.  If your bandage has been discontinued, keep your incision clean and dry.  Pat dry after bathing.  DO NOT put lotion or powder on your incision.   06/09/22 1114            Follow-up Information     Myrene Galas, MD. Schedule an appointment as soon as possible for a visit in 10 day(s).   Specialty: Orthopedic Surgery Contact information: [address] [place] [address] [phone]                 Signed:  [name], PA-C [phone] (C) 06/09/2022, 12:41 PM  [facility] [address] [place] [address] [phone] (F)

## 2022-06-09 NOTE — TOC Progression Note (Addendum)
Transition of Care [facility]) - Progression Note    Patient Details  Name: KRAMER HANRAHAN MRN: [id number] Date of Birth: 1956-02-05  Transition of Care [facility]) CM/SW Contact  Epifanio Lesches, RN Phone Number: 06/09/2022, 2:50 PM  Clinical Narrative:    Per provider pt is for d/c on 06/10/2022. Pt agreeable to home health services. Pt will transition to home with sister. Pt with provider preference: [facility]. Referral made with [facility] and accepted pending provider orders. NCM requested orders/ F2F from provider. Pt without DME needs. Sister to provide transportation to home. Pt without Rx med concerns. Post hospital f/u noted on AVS.  TOC  team will continue to monitor and assist with needs...  Expected Discharge Plan: Home w Home Health Services Barriers to Discharge: Continued Medical Work up  Expected Discharge Plan and Services Expected Discharge Plan: Home w Home Health Services In-house Referral: Clinical Social Work   Post Acute Care Choice: Home Health Living arrangements for the past 2 months: Single Family Home Expected Discharge Date: 06/10/22                         HH Arranged: PT, OT, Nurse's Aide HH Agency: [facility] Date HH Agency Contacted: 06/09/22 Time HH Agency Contacted: 1400 Representative spoke with at [facility] Agency: Morrie Sheldon   Social Determinants of Health (SDOH) Interventions    Readmission Risk Interventions     No data to display

## 2022-06-09 NOTE — Progress Notes (Signed)
Occupational Therapy Treatment Patient Details Name: Jonathon Anderson MRN: [id number] DOB: [birth year] Today's Date: 06/09/2022   History of present illness 66 yo male admitted 11/14 with history of injury to L thigh had disruption of IM nailing to L femur from 2022, now has received bone graft and screws/plate for a non union.  PMHx:  HLD, HTN, smoker.   OT comments  Pt. Seen for skilled OT treatment session.  Wonderful disposition and eager to participate.  However, he remained limited with session secondary to tachy HR throughout session.  Pt. Completed bed mobility in/out with min guard a.  Lb dressing eob min a.  Rn aware of tachy HR and pt. Sweating.  Was present at end of session to assist with applying new telemetry stickers and verifying placement.  Cont. To progress with current OT goals as pt. Able.     Recommendations for follow up therapy are one component of a multi-disciplinary discharge planning process, led by the attending physician.  Recommendations may be updated based on patient status, additional functional criteria and insurance authorization.    Follow Up Recommendations  Skilled nursing-short term rehab (<3 hours/day)     Assistance Recommended at Discharge Frequent or constant Supervision/Assistance  Patient can return home with the following  Two people to help with walking and/or transfers;Assistance with cooking/housework;A lot of help with bathing/dressing/bathroom;Direct supervision/assist for medications management;Direct supervision/assist for financial management;Assist for transportation;Help with stairs or ramp for entrance   Equipment Recommendations  Tub/shower bench;Wheelchair (measurements OT);Wheelchair cushion (measurements OT)    Recommendations for Other Services      Precautions / Restrictions Precautions Precautions: Fall Precaution Comments: wound vac Restrictions LLE Weight Bearing: Non weight bearing Other Position/Activity  Restrictions: watch HR, has been very tachy       Mobility Bed Mobility Overal bed mobility: Needs Assistance Bed Mobility: Supine to Sit     Supine to sit: Min guard     General bed mobility comments: hob flat to simulate home env. exited from R side. used b ues to advance LLE to eob, back to bed hooked r foot under L to assist guiding it in. (states used same tech. with hip sx. last year).  able to sit in long sitting and push through bles to scoot up in bed prior to laying down    Transfers                   General transfer comment: pt. seated eob only this session     Balance                                           ADL either performed or assessed with clinical judgement   ADL Overall ADL's : Needs assistance/impaired                     Lower Body Dressing: Minimal assistance;Sitting/lateral leans Lower Body Dressing Details (indicate cue type and reason): pt. able to turn in bed and bring leg sideways to reach R foot to don shoe               General ADL Comments: session remained at eob secondary to tachy HR    Extremity/Trunk Assessment              Vision       Perception     Praxis  Cognition Arousal/Alertness: Awake/alert Behavior During Therapy: WFL for tasks assessed/performed Overall Cognitive Status: Within Functional Limits for tasks assessed                                 General Comments: very positive, great attitude towards therapy        Exercises      Shoulder Instructions       General Comments      Pertinent Vitals/ Pain       Pain Assessment Pain Assessment: No/denies pain  Home Living                                          Prior Functioning/Environment              Frequency  Min 2X/week        Progress Toward Goals  OT Goals(current goals can now be found in the care plan section)  Progress towards OT goals: Progressing  toward goals     Plan Discharge plan remains appropriate    Co-evaluation                 AM-PAC OT "6 Clicks" Daily Activity     Outcome Measure   Help from another person eating meals?: None Help from another person taking care of personal grooming?: A Little Help from another person toileting, which includes using toliet, bedpan, or urinal?: A Lot Help from another person bathing (including washing, rinsing, drying)?: A Lot Help from another person to put on and taking off regular upper body clothing?: A Little Help from another person to put on and taking off regular lower body clothing?: Total 6 Click Score: 15    End of Session    OT Visit Diagnosis: Unsteadiness on feet (R26.81);Other abnormalities of gait and mobility (R26.89);Muscle weakness (generalized) (M62.81);History of falling (Z91.81);Other symptoms and signs involving cognitive function;Pain   Activity Tolerance Other (comment) (limited by Tachy HR)   Patient Left in bed;with call bell/phone within reach;with bed alarm set;with nursing/sitter in room   Nurse Communication Other (comment) (alerted RN need for new telemetry lead stickers and replament of locations. kept falling off during session)        Time: 7494-4967 OT Time Calculation (min): 21 min  Charges: OT General Charges $OT Visit: 1 Visit OT Treatments $Self Care/Home Management : 8-22 mins  Boneta Lucks, COTA/L Acute Rehabilitation [phone]   Alessandra Bevels Lorraine-COTA/L 06/09/2022, 9:57 AM

## 2022-06-10 LAB — CBC
HCT: 25.2 % — ABNORMAL LOW (ref 39.0–52.0)
Hemoglobin: 8.6 g/dL — ABNORMAL LOW (ref 13.0–17.0)
MCH: 31.7 pg (ref 26.0–34.0)
MCHC: 34.1 g/dL (ref 30.0–36.0)
MCV: 93 fL (ref 80.0–100.0)
Platelets: 319 10*3/uL (ref 150–400)
RBC: 2.71 MIL/uL — ABNORMAL LOW (ref 4.22–5.81)
RDW: 13.8 % (ref 11.5–15.5)
WBC: 8.1 10*3/uL (ref 4.0–10.5)
nRBC: 0 % (ref 0.0–0.2)

## 2022-06-10 LAB — BASIC METABOLIC PANEL
Anion gap: 8 (ref 5–15)
BUN: <5 mg/dL — ABNORMAL LOW (ref 8–23)
CO2: 28 mmol/L (ref 22–32)
Calcium: 8 mg/dL — ABNORMAL LOW (ref 8.9–10.3)
Chloride: 96 mmol/L — ABNORMAL LOW (ref 98–111)
Creatinine, Ser: 0.61 mg/dL (ref 0.61–1.24)
GFR, Estimated: >60 mL/min (ref >60)
Glucose, Bld: 109 mg/dL — ABNORMAL HIGH (ref 70–99)
Potassium: 3.6 mmol/L (ref 3.5–5.1)
Sodium: 132 mmol/L — ABNORMAL LOW (ref 135–145)

## 2022-06-10 NOTE — Progress Notes (Signed)
Orthopaedic Trauma Service Progress Note  Patient ID: Jonathon Anderson MRN: [id number] DOB/AGE: [birth year] 66 y.o.  Subjective:  Doing better  Hemoglobin stable 8.6 unchanged from yesterday. Patient feels ready to go home today. No issues overnight.   ROS As above  Objective:   VITALS:   Vitals:   06/09/22 0821 06/09/22 1456 06/09/22 2004 06/10/22 0411  BP: (!) 102/90 113/71 106/71 125/68  Pulse: (!) 119 83 92 85  Resp: 17 17 15 18   Temp: 98.8 F (37.1 C) 98.3 F (36.8 C) 97.8 F (36.6 C) 97.6 F (36.4 C)  TempSrc: Oral Oral  Oral  SpO2: 96% 99% 98% 100%  Weight:      Height:        Estimated body mass index is 31.77 kg/m as calculated from the following:   Height as of this encounter: 5' 8.5" (1.74 m).   Weight as of this encounter: 96.2 kg.   Intake/Output      11/17 0701 11/18 0700 11/18 0701 11/19 0700   P.O. 450    I.V. (mL/kg)     Blood     Total Intake(mL/kg) 450 (4.7)    Urine (mL/kg/hr) 1300 (0.6)    Drains     Total Output 1300    Net -850           LABS  Results for orders placed or performed during the hospital encounter of 06/06/22 (from the past 24 hour(s))  CBC     Status: Abnormal   Collection Time: 06/10/22  2:47 AM  Result Value Ref Range   WBC 8.1 4.0 - 10.5 K/uL   RBC 2.71 (L) 4.22 - 5.81 MIL/uL   Hemoglobin 8.6 (L) 13.0 - 17.0 g/dL   HCT 06/12/22 (L) 29.5 - 28.4 %   MCV 93.0 80.0 - 100.0 fL   MCH 31.7 26.0 - 34.0 pg   MCHC 34.1 30.0 - 36.0 g/dL   RDW 13.2 44.0 - 10.2 %   Platelets 319 150 - 400 K/uL   nRBC 0.0 0.0 - 0.2 %  Basic metabolic panel     Status: Abnormal   Collection Time: 06/10/22  2:47 AM  Result Value Ref Range   Sodium 132 (L) 135 - 145 mmol/L   Potassium 3.6 3.5 - 5.1 mmol/L   Chloride 96 (L) 98 - 111 mmol/L   CO2 28 22 - 32 mmol/L   Glucose, Bld 109 (H) 70 - 99 mg/dL   BUN <5 (L) 8 - 23 mg/dL   Creatinine, Ser 06/12/22 0.61 - 1.24  mg/dL   Calcium 8.0 (L) 8.9 - 10.3 mg/dL   GFR, Estimated 3.66 >44 mL/min   Anion gap 8 5 - 15     PHYSICAL EXAM:   Gen: sitting up in bed, NAD, pleasant, eating breakfast  Lungs: unlabored Cardiac: tachy but reg Ext:       Left Lower extremity              Prevena functioning well, good seal                         prevena removed    Incisions look great   No drainage   No signs of infection  DPN, SPN , TN sensation intact             EHL, FHL, lesser toe motor intact             Ankle flexion, extension, inversion and eversion intact             + Quad set              No DCT             Minimal swelling distally              + DP pulse  Assessment/Plan: 4 Days Post-Op     Anti-infectives (From admission, onward)    Start     Dose/Rate Route Frequency Ordered Stop   06/06/22 1800  ceFAZolin (ANCEF) IVPB 2g/100 mL premix        2 g 200 mL/hr over 30 Minutes Intravenous Every 6 hours 06/06/22 1548 06/07/22 0700   06/06/22 1107  vancomycin (VANCOCIN) powder  Status:  Discontinued          As needed 06/06/22 1107 06/06/22 1315   06/06/22 1106  tobramycin (NEBCIN) powder  Status:  Discontinued          As needed 06/06/22 1107 06/06/22 1315   06/06/22 0600  ceFAZolin (ANCEF) IVPB 2g/100 mL premix        2 g 200 mL/hr over 30 Minutes Intravenous On call to O.R. 06/06/22 0544 06/06/22 1217     .  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, plate osteosynthesis of nonunion with blade plate, antibiotic cement spacer placement             Touchdown weightbearing left leg using crutches or walker             Unrestricted range of motion left hip and knee             PT and OT evaluations             Prevena removed   Aquacel ag dressing applied to the larger incision areas and a mepilex applied to proximal incision     Change dressing again in another 5 days                Ice and elevate for swelling and pain control               home tomorrow   - Pain management:             Multimodal   - ABL anemia/Hemodynamics             improved after 2 units PRBCs  Restart metoprolol    - Medical issues              Hyponatremia- improving   Continue to hold triamterene-HCTZ   - DVT/PE prophylaxis:             start eliquis tomorrow    - ID:              Periop abx completed    - Metabolic Bone Disease:             Vitamin d levels look great   - Activity:             As above   - FEN/GI prophylaxis/Foley/Lines:             Reg diet   -  Impediments to fracture healing:             Established nonunion              Nicotine use    - Dispo:             continue therapies   Dc home tomorrow    Pt still does not want snf   States sister and brother will help    Weber Cooks, MD Orthopaedic Surgery  06/10/2022, 7:47 AM  [facility] [address] [place] [address] [phone] [phone] (F)    After 5pm and on the weekends please log on to Amion, go to orthopaedics and the look under the [facility] Call for the provider(s) on call. You can also call our office at [phone] and then follow the prompts to be connected to the call team.  Patient ID: Jonathon Anderson, male   DOB: [birth year], 66 y.o.   MRN: [id number]

## 2022-06-10 NOTE — Plan of Care (Signed)
  Problem: Education: Goal: Verbalization of understanding the information provided (i.e., activity precautions, restrictions, etc) will improve Outcome: Adequate for Discharge Goal: Individualized Educational Video(s) Outcome: Adequate for Discharge   Problem: Activity: Goal: Ability to ambulate and perform ADLs will improve Outcome: Adequate for Discharge   Problem: Clinical Measurements: Goal: Postoperative complications will be avoided or minimized Outcome: Adequate for Discharge   Problem: Self-Concept: Goal: Ability to maintain and perform role responsibilities to the fullest extent possible will improve Outcome: Adequate for Discharge   Problem: Pain Management: Goal: Pain level will decrease Outcome: Adequate for Discharge   Problem: Education: Goal: Knowledge of General Education information will improve Description: Including pain rating scale, medication(s)/side effects and non-pharmacologic comfort measures Outcome: Adequate for Discharge   Problem: Health Behavior/Discharge Planning: Goal: Ability to manage health-related needs will improve Outcome: Adequate for Discharge   Problem: Clinical Measurements: Goal: Ability to maintain clinical measurements within normal limits will improve Outcome: Adequate for Discharge Goal: Will remain free from infection Outcome: Adequate for Discharge Goal: Diagnostic test results will improve Outcome: Adequate for Discharge Goal: Respiratory complications will improve Outcome: Adequate for Discharge Goal: Cardiovascular complication will be avoided Outcome: Adequate for Discharge   Problem: Activity: Goal: Risk for activity intolerance will decrease Outcome: Adequate for Discharge   Problem: Nutrition: Goal: Adequate nutrition will be maintained Outcome: Adequate for Discharge   Problem: Coping: Goal: Level of anxiety will decrease Outcome: Adequate for Discharge   Problem: Elimination: Goal: Will not experience  complications related to bowel motility Outcome: Adequate for Discharge Goal: Will not experience complications related to urinary retention Outcome: Adequate for Discharge   Problem: Pain Managment: Goal: General experience of comfort will improve Outcome: Adequate for Discharge   Problem: Skin Integrity: Goal: Risk for impaired skin integrity will decrease Outcome: Adequate for Discharge   Problem: Safety: Goal: Ability to remain free from injury will improve Outcome: Adequate for Discharge   Problem: Pain Managment: Goal: General experience of comfort will improve Outcome: Adequate for Discharge

## 2022-06-11 LAB — AEROBIC/ANAEROBIC CULTURE W GRAM STAIN (SURGICAL/DEEP WOUND): Culture: NO GROWTH

## 2022-06-12 DIAGNOSIS — Z7982 Long term (current) use of aspirin: Secondary | ICD-10-CM | POA: Diagnosis not present

## 2022-06-12 DIAGNOSIS — Z6841 Body Mass Index (BMI) 40.0 and over, adult: Secondary | ICD-10-CM | POA: Diagnosis not present

## 2022-06-12 DIAGNOSIS — T84125D Displacement of internal fixation device of left femur, subsequent encounter: Secondary | ICD-10-CM | POA: Diagnosis not present

## 2022-06-12 DIAGNOSIS — E669 Obesity, unspecified: Secondary | ICD-10-CM | POA: Diagnosis not present

## 2022-06-12 DIAGNOSIS — Z9181 History of falling: Secondary | ICD-10-CM | POA: Diagnosis not present

## 2022-06-12 DIAGNOSIS — Z7901 Long term (current) use of anticoagulants: Secondary | ICD-10-CM | POA: Diagnosis not present

## 2022-06-12 DIAGNOSIS — D62 Acute posthemorrhagic anemia: Secondary | ICD-10-CM | POA: Diagnosis not present

## 2022-06-12 DIAGNOSIS — E785 Hyperlipidemia, unspecified: Secondary | ICD-10-CM | POA: Diagnosis not present

## 2022-06-12 DIAGNOSIS — Z87891 Personal history of nicotine dependence: Secondary | ICD-10-CM | POA: Diagnosis not present

## 2022-06-12 DIAGNOSIS — Z79891 Long term (current) use of opiate analgesic: Secondary | ICD-10-CM | POA: Diagnosis not present

## 2022-06-12 DIAGNOSIS — I10 Essential (primary) hypertension: Secondary | ICD-10-CM | POA: Diagnosis not present

## 2022-06-14 DIAGNOSIS — E785 Hyperlipidemia, unspecified: Secondary | ICD-10-CM | POA: Diagnosis not present

## 2022-06-14 DIAGNOSIS — I10 Essential (primary) hypertension: Secondary | ICD-10-CM | POA: Diagnosis not present

## 2022-06-14 DIAGNOSIS — T84125D Displacement of internal fixation device of left femur, subsequent encounter: Secondary | ICD-10-CM | POA: Diagnosis not present

## 2022-06-14 DIAGNOSIS — E669 Obesity, unspecified: Secondary | ICD-10-CM | POA: Diagnosis not present

## 2022-06-14 DIAGNOSIS — D62 Acute posthemorrhagic anemia: Secondary | ICD-10-CM | POA: Diagnosis not present

## 2022-06-19 DIAGNOSIS — D62 Acute posthemorrhagic anemia: Secondary | ICD-10-CM | POA: Diagnosis not present

## 2022-06-19 DIAGNOSIS — T84125D Displacement of internal fixation device of left femur, subsequent encounter: Secondary | ICD-10-CM | POA: Diagnosis not present

## 2022-06-19 DIAGNOSIS — E785 Hyperlipidemia, unspecified: Secondary | ICD-10-CM | POA: Diagnosis not present

## 2022-06-19 DIAGNOSIS — I10 Essential (primary) hypertension: Secondary | ICD-10-CM | POA: Diagnosis not present

## 2022-06-19 DIAGNOSIS — E669 Obesity, unspecified: Secondary | ICD-10-CM | POA: Diagnosis not present

## 2022-06-20 DIAGNOSIS — D62 Acute posthemorrhagic anemia: Secondary | ICD-10-CM | POA: Diagnosis not present

## 2022-06-20 DIAGNOSIS — T148XXA Other injury of unspecified body region, initial encounter: Secondary | ICD-10-CM | POA: Diagnosis not present

## 2022-06-20 DIAGNOSIS — E871 Hypo-osmolality and hyponatremia: Secondary | ICD-10-CM | POA: Diagnosis not present

## 2022-06-20 DIAGNOSIS — I10 Essential (primary) hypertension: Secondary | ICD-10-CM | POA: Diagnosis not present

## 2022-06-21 DIAGNOSIS — D62 Acute posthemorrhagic anemia: Secondary | ICD-10-CM | POA: Diagnosis not present

## 2022-06-21 DIAGNOSIS — T84125D Displacement of internal fixation device of left femur, subsequent encounter: Secondary | ICD-10-CM | POA: Diagnosis not present

## 2022-06-21 DIAGNOSIS — E669 Obesity, unspecified: Secondary | ICD-10-CM | POA: Diagnosis not present

## 2022-06-21 DIAGNOSIS — E785 Hyperlipidemia, unspecified: Secondary | ICD-10-CM | POA: Diagnosis not present

## 2022-06-21 DIAGNOSIS — I10 Essential (primary) hypertension: Secondary | ICD-10-CM | POA: Diagnosis not present

## 2022-06-23 DIAGNOSIS — E669 Obesity, unspecified: Secondary | ICD-10-CM | POA: Diagnosis not present

## 2022-06-23 DIAGNOSIS — T84125D Displacement of internal fixation device of left femur, subsequent encounter: Secondary | ICD-10-CM | POA: Diagnosis not present

## 2022-06-23 DIAGNOSIS — I10 Essential (primary) hypertension: Secondary | ICD-10-CM | POA: Diagnosis not present

## 2022-06-23 DIAGNOSIS — D62 Acute posthemorrhagic anemia: Secondary | ICD-10-CM | POA: Diagnosis not present

## 2022-06-23 DIAGNOSIS — E785 Hyperlipidemia, unspecified: Secondary | ICD-10-CM | POA: Diagnosis not present

## 2022-06-26 DIAGNOSIS — S72142K Displaced intertrochanteric fracture of left femur, subsequent encounter for closed fracture with nonunion: Secondary | ICD-10-CM | POA: Diagnosis not present

## 2022-06-26 DIAGNOSIS — S72052K Unspecified fracture of head of left femur, subsequent encounter for closed fracture with nonunion: Secondary | ICD-10-CM | POA: Diagnosis not present

## 2022-06-28 DIAGNOSIS — E669 Obesity, unspecified: Secondary | ICD-10-CM | POA: Diagnosis not present

## 2022-06-28 DIAGNOSIS — D62 Acute posthemorrhagic anemia: Secondary | ICD-10-CM | POA: Diagnosis not present

## 2022-06-28 DIAGNOSIS — T84125D Displacement of internal fixation device of left femur, subsequent encounter: Secondary | ICD-10-CM | POA: Diagnosis not present

## 2022-06-28 DIAGNOSIS — I10 Essential (primary) hypertension: Secondary | ICD-10-CM | POA: Diagnosis not present

## 2022-06-28 DIAGNOSIS — E785 Hyperlipidemia, unspecified: Secondary | ICD-10-CM | POA: Diagnosis not present

## 2022-06-30 DIAGNOSIS — E785 Hyperlipidemia, unspecified: Secondary | ICD-10-CM | POA: Diagnosis not present

## 2022-06-30 DIAGNOSIS — E669 Obesity, unspecified: Secondary | ICD-10-CM | POA: Diagnosis not present

## 2022-06-30 DIAGNOSIS — I10 Essential (primary) hypertension: Secondary | ICD-10-CM | POA: Diagnosis not present

## 2022-06-30 DIAGNOSIS — T84125D Displacement of internal fixation device of left femur, subsequent encounter: Secondary | ICD-10-CM | POA: Diagnosis not present

## 2022-06-30 DIAGNOSIS — D62 Acute posthemorrhagic anemia: Secondary | ICD-10-CM | POA: Diagnosis not present

## 2022-07-03 DIAGNOSIS — E669 Obesity, unspecified: Secondary | ICD-10-CM | POA: Diagnosis not present

## 2022-07-03 DIAGNOSIS — E785 Hyperlipidemia, unspecified: Secondary | ICD-10-CM | POA: Diagnosis not present

## 2022-07-03 DIAGNOSIS — D62 Acute posthemorrhagic anemia: Secondary | ICD-10-CM | POA: Diagnosis not present

## 2022-07-03 DIAGNOSIS — I10 Essential (primary) hypertension: Secondary | ICD-10-CM | POA: Diagnosis not present

## 2022-07-03 DIAGNOSIS — T84125D Displacement of internal fixation device of left femur, subsequent encounter: Secondary | ICD-10-CM | POA: Diagnosis not present

## 2022-07-04 DIAGNOSIS — T84125D Displacement of internal fixation device of left femur, subsequent encounter: Secondary | ICD-10-CM | POA: Diagnosis not present

## 2022-07-04 DIAGNOSIS — E785 Hyperlipidemia, unspecified: Secondary | ICD-10-CM | POA: Diagnosis not present

## 2022-07-04 DIAGNOSIS — I10 Essential (primary) hypertension: Secondary | ICD-10-CM | POA: Diagnosis not present

## 2022-07-04 DIAGNOSIS — D62 Acute posthemorrhagic anemia: Secondary | ICD-10-CM | POA: Diagnosis not present

## 2022-07-04 DIAGNOSIS — E669 Obesity, unspecified: Secondary | ICD-10-CM | POA: Diagnosis not present

## 2022-07-05 DIAGNOSIS — I10 Essential (primary) hypertension: Secondary | ICD-10-CM | POA: Diagnosis not present

## 2022-07-05 DIAGNOSIS — T84125D Displacement of internal fixation device of left femur, subsequent encounter: Secondary | ICD-10-CM | POA: Diagnosis not present

## 2022-07-05 DIAGNOSIS — E669 Obesity, unspecified: Secondary | ICD-10-CM | POA: Diagnosis not present

## 2022-07-05 DIAGNOSIS — D62 Acute posthemorrhagic anemia: Secondary | ICD-10-CM | POA: Diagnosis not present

## 2022-07-05 DIAGNOSIS — E785 Hyperlipidemia, unspecified: Secondary | ICD-10-CM | POA: Diagnosis not present

## 2022-07-06 DIAGNOSIS — I10 Essential (primary) hypertension: Secondary | ICD-10-CM | POA: Diagnosis not present

## 2022-07-06 DIAGNOSIS — D62 Acute posthemorrhagic anemia: Secondary | ICD-10-CM | POA: Diagnosis not present

## 2022-07-06 DIAGNOSIS — E785 Hyperlipidemia, unspecified: Secondary | ICD-10-CM | POA: Diagnosis not present

## 2022-07-06 DIAGNOSIS — E669 Obesity, unspecified: Secondary | ICD-10-CM | POA: Diagnosis not present

## 2022-07-06 DIAGNOSIS — T84125D Displacement of internal fixation device of left femur, subsequent encounter: Secondary | ICD-10-CM | POA: Diagnosis not present

## 2022-07-10 DIAGNOSIS — S7222XK Displaced subtrochanteric fracture of left femur, subsequent encounter for closed fracture with nonunion: Secondary | ICD-10-CM | POA: Diagnosis not present

## 2022-07-11 LAB — FUNGUS CULTURE WITH STAIN

## 2022-07-11 LAB — FUNGUS CULTURE RESULT

## 2022-07-11 LAB — FUNGAL ORGANISM REFLEX

## 2022-07-12 DIAGNOSIS — T84125D Displacement of internal fixation device of left femur, subsequent encounter: Secondary | ICD-10-CM | POA: Diagnosis not present

## 2022-07-12 DIAGNOSIS — E785 Hyperlipidemia, unspecified: Secondary | ICD-10-CM | POA: Diagnosis not present

## 2022-07-12 DIAGNOSIS — E669 Obesity, unspecified: Secondary | ICD-10-CM | POA: Diagnosis not present

## 2022-07-12 DIAGNOSIS — Z6841 Body Mass Index (BMI) 40.0 and over, adult: Secondary | ICD-10-CM | POA: Diagnosis not present

## 2022-07-12 DIAGNOSIS — I10 Essential (primary) hypertension: Secondary | ICD-10-CM | POA: Diagnosis not present

## 2022-07-12 DIAGNOSIS — Z7901 Long term (current) use of anticoagulants: Secondary | ICD-10-CM | POA: Diagnosis not present

## 2022-07-12 DIAGNOSIS — D62 Acute posthemorrhagic anemia: Secondary | ICD-10-CM | POA: Diagnosis not present

## 2022-07-12 DIAGNOSIS — Z87891 Personal history of nicotine dependence: Secondary | ICD-10-CM | POA: Diagnosis not present

## 2022-07-12 DIAGNOSIS — Z7982 Long term (current) use of aspirin: Secondary | ICD-10-CM | POA: Diagnosis not present

## 2022-07-12 DIAGNOSIS — Z79891 Long term (current) use of opiate analgesic: Secondary | ICD-10-CM | POA: Diagnosis not present

## 2022-07-12 DIAGNOSIS — Z9181 History of falling: Secondary | ICD-10-CM | POA: Diagnosis not present

## 2022-07-13 DIAGNOSIS — T84125D Displacement of internal fixation device of left femur, subsequent encounter: Secondary | ICD-10-CM | POA: Diagnosis not present

## 2022-07-13 DIAGNOSIS — E669 Obesity, unspecified: Secondary | ICD-10-CM | POA: Diagnosis not present

## 2022-07-13 DIAGNOSIS — D62 Acute posthemorrhagic anemia: Secondary | ICD-10-CM | POA: Diagnosis not present

## 2022-07-13 DIAGNOSIS — E785 Hyperlipidemia, unspecified: Secondary | ICD-10-CM | POA: Diagnosis not present

## 2022-07-13 DIAGNOSIS — I10 Essential (primary) hypertension: Secondary | ICD-10-CM | POA: Diagnosis not present

## 2022-07-19 DIAGNOSIS — T84125D Displacement of internal fixation device of left femur, subsequent encounter: Secondary | ICD-10-CM | POA: Diagnosis not present

## 2022-07-19 DIAGNOSIS — E785 Hyperlipidemia, unspecified: Secondary | ICD-10-CM | POA: Diagnosis not present

## 2022-07-19 DIAGNOSIS — E669 Obesity, unspecified: Secondary | ICD-10-CM | POA: Diagnosis not present

## 2022-07-19 DIAGNOSIS — D62 Acute posthemorrhagic anemia: Secondary | ICD-10-CM | POA: Diagnosis not present

## 2022-07-19 DIAGNOSIS — I10 Essential (primary) hypertension: Secondary | ICD-10-CM | POA: Diagnosis not present

## 2022-07-25 DIAGNOSIS — E785 Hyperlipidemia, unspecified: Secondary | ICD-10-CM | POA: Diagnosis not present

## 2022-07-25 DIAGNOSIS — T84125D Displacement of internal fixation device of left femur, subsequent encounter: Secondary | ICD-10-CM | POA: Diagnosis not present

## 2022-07-25 DIAGNOSIS — E669 Obesity, unspecified: Secondary | ICD-10-CM | POA: Diagnosis not present

## 2022-07-25 DIAGNOSIS — D62 Acute posthemorrhagic anemia: Secondary | ICD-10-CM | POA: Diagnosis not present

## 2022-07-25 DIAGNOSIS — I10 Essential (primary) hypertension: Secondary | ICD-10-CM | POA: Diagnosis not present

## 2022-07-26 ENCOUNTER — Encounter (HOSPITAL_COMMUNITY): Payer: Self-pay | Admitting: Orthopedic Surgery

## 2022-07-26 ENCOUNTER — Other Ambulatory Visit: Payer: Self-pay

## 2022-07-26 NOTE — H&P (Signed)
Orthopaedic Trauma Service (OTS) Consult   Patient ID: [name] MRN: [id number] DOB/AGE: Jun 17, 1956 67 y.o.    HPI: Jonathon Anderson is an 67 y.o. male s/p intramedullary nailing of left femur October 2022 unfortunately developed screw cut out and nonunion.  He was taken to the operating room by the orthopedic trauma service on 06/06/2022 for removal of hardware, repair of nonunion with blade plate and placement of antibiotic spacer for induced membrane technique.  Patient presents today for removal of his antibiotic spacer and grafting of the nonunion site.  Patient has done very well since his previous surgery.  No issues.  Risks and benefits reviewed with the patient and he wishes to proceed.  All cultures from previous surgery including aerobic and anaerobic bacterial cultures and fungal cultures are negative.  Gross surgical pathology was really unremarkable.  Consistent with nonunion  Past Medical History:  Diagnosis Date   Hyperlipidemia    Hypertension    Nicotine dependence     Past Surgical History:  Procedure Laterality Date   HARDWARE REMOVAL Left 06/06/2022   Procedure: HARDWARE REMOVAL;  Surgeon: Myrene Galas, MD;  Location: [facility] OR;  Service: Orthopedics;  Laterality: Left;   INTRAMEDULLARY (IM) NAIL INTERTROCHANTERIC Left 05/11/2021   Procedure: INTRAMEDULLARY (IM) NAIL INTERTROCHANTRIC OF LEFT SUBTHROCHANTRIC HIP FRACTURE;  Surgeon: Christena Flake, MD;  Location: [facility] ORS;  Service: Orthopedics;  Laterality: Left;   ORIF FEMUR FRACTURE Left 06/06/2022   Procedure: OPEN REDUCTION INTERNAL FIXATION FEMORAL PROXIMAL FRACTURE;  Surgeon: Myrene Galas, MD;  Location: [facility] OR;  Service: Orthopedics;  Laterality: Left;    Family History  Problem Relation Age of Onset   Heart attack Mother    Hypertension Brother    Heart failure Paternal Grandfather     Social History:  reports that he quit smoking about 8 weeks ago. His smoking use included  cigarettes. He smoked an average of .5 packs per day. He has never used smokeless tobacco. He reports that he does not currently use alcohol. He reports that he does not use drugs.  Allergies: No Known Allergies  Medications: I have reviewed the patient's current medications. Current Outpatient Medications  Medication Instructions   acetaminophen (TYLENOL) 500 mg, Oral, Every 12 hours PRN   acetaminophen (TYLENOL) 1,300 mg, Oral, Every 8 hours PRN   apixaban (ELIQUIS) 2.5 mg, Oral, 2 times daily   aspirin EC 81 mg, Oral, Daily at bedtime, Swallow whole.   atorvastatin (LIPITOR) 20 mg, Oral, Daily at bedtime   cyclobenzaprine (FLEXERIL) 10 mg, Oral, 3 times daily PRN   diclofenac (VOLTAREN) 75 mg, Oral, 2 times daily   docusate sodium (COLACE) 100 mg, Oral, 2 times daily   Fish Oil 1,200 mg, Oral, 2 times daily   Menthol, Topical Analgesic, (BIOFREEZE EX) 1 Application, Apply externally, Daily PRN   metoprolol tartrate (LOPRESSOR) 100 mg, Oral, 2 times daily   oxyCODONE (OXY IR/ROXICODONE) 5-10 mg, Oral, Every 8 hours PRN   oxyCODONE-acetaminophen (PERCOCET/ROXICET) 5-325 MG tablet 1-2 tablets, Oral, Every 6 hours PRN   Potassium 99 mg, Oral, Daily   thiamine (VITAMIN B1) 100 mg, Oral, Daily   triamterene-hydrochlorothiazide (MAXZIDE) 75-50 MG tablet 1 tablet, Oral, Every morning     Results for orders placed or performed during the hospital encounter of 07/27/22 (from the past 48 hour(s))  Type and screen Order type and screen if day of surgery is less than 15 days from draw of preadmission visit or order  morning of surgery if day of surgery is greater than 6 days from preadmission visit.     Status: None (Preliminary result)   Collection Time: 07/27/22  8:10 AM  Result Value Ref Range   ABO/RH(D) PENDING    Antibody Screen PENDING    Sample Expiration      07/30/2022,2359 Performed at [facility], [address] [place], [place] [address]   CBC WITH DIFFERENTIAL      Status: Abnormal   Collection Time: 07/27/22  8:21 AM  Result Value Ref Range   WBC 7.6 4.0 - 10.5 K/uL   RBC 4.39 4.22 - 5.81 MIL/uL   Hemoglobin 13.0 13.0 - 17.0 g/dL   HCT 60.4 54.0 - 98.1 %   MCV 93.4 80.0 - 100.0 fL   MCH 29.6 26.0 - 34.0 pg   MCHC 31.7 30.0 - 36.0 g/dL   RDW 19.1 47.8 - 29.5 %   Platelets 405 (H) 150 - 400 K/uL   nRBC 0.0 0.0 - 0.2 %   Neutrophils Relative % 75 %   Neutro Abs 5.7 1.7 - 7.7 K/uL   Lymphocytes Relative 19 %   Lymphs Abs 1.5 0.7 - 4.0 K/uL   Monocytes Relative 4 %   Monocytes Absolute 0.3 0.1 - 1.0 K/uL   Eosinophils Relative 2 %   Eosinophils Absolute 0.2 0.0 - 0.5 K/uL   Basophils Relative 0 %   Basophils Absolute 0.0 0.0 - 0.1 K/uL   Immature Granulocytes 0 %   Abs Immature Granulocytes 0.02 0.00 - 0.07 K/uL    Comment: Performed at [facility], [address], [place], [place] [address]    No results found.  Intake/Output    None      Review of Systems  Constitutional:  Negative for chills and fever.  Respiratory:  Negative for shortness of breath and wheezing.   Cardiovascular:  Negative for chest pain and palpitations.  Gastrointestinal:  Negative for abdominal pain, nausea and vomiting.  Neurological:  Negative for tingling, sensory change and speech change.   Blood pressure (!) 183/97, pulse (!) 107, temperature 97.7 F (36.5 C), temperature source Oral, resp. rate 20, height 5' 8.5" (1.74 m), weight 95.7 kg, SpO2 99 %. Physical Exam Vitals reviewed.  Constitutional:      Appearance: Normal appearance. He is normal weight.  Eyes:     Extraocular Movements: Extraocular movements intact.  Cardiovascular:     Rate and Rhythm: Normal rate and regular rhythm.  Pulmonary:     Effort: Pulmonary effort is normal.     Breath sounds: Normal breath sounds.  Musculoskeletal:     Comments: Left lower extremity Surgical wounds healed Minimal swelling Distal motor and sensory functions intact Extremity is warm + DP  pulse No pitting edema No other concerning findings noted on exam  Skin:    General: Skin is warm.     Capillary Refill: Capillary refill takes less than 2 seconds.  Neurological:     General: No focal deficit present.     Mental Status: He is alert and oriented to person, place, and time.  Psychiatric:        Mood and Affect: Mood normal.        Thought Content: Thought content normal.        Judgment: Judgment normal.      Assessment/Plan:  67 year old male s/p repair of left proximal femur nonunion November 2023 presents today for second stage of procedure including removal of antibiotic  spacer and grafting of the nonunion site  -Left proximal femur nonunion  OR for removal of antibiotic spacer and bone grafting  Touchdown weightbearing postoperatively  Unrestricted range of motion of left hip and knee  Will likely admit overnight for observation and pain control  BP is noted to be elevated preoperatively.  Will monitor these and resume his home medications.  Blood pressures were fairly well-controlled during his last hospitalization   - Pain management:  Titrate accordingly  - ABL anemia/Hemodynamics  Stable, monitor.  Would not anticipate significant blood loss today  - Medical issues   Home medications  - DVT/PE prophylaxis:  Resume Eliquis postop - ID:   Perioperative antibiotics - Metabolic Bone Disease:  Good vitamin D levels noted during last admission - Activity:  As above - FEN/GI prophylaxis/Foley/Lines:  Advance diet postoperatively  - Impediments to fracture healing:  Established nonunion - Dispo:  OR for removal of antibiotic spacer and grafting of nonunion site left proximal femur    [name], PA-C [phone] (C) 07/27/2022, 8:38 AM  [facility] [address] [place] [address] [phone] [phone] (F)    After 5pm and on the weekends please log on to Amion, go to orthopaedics and the look  under the [facility] Call for the provider(s) on call. You can also call our office at [phone] and then follow the prompts to be connected to the call team.

## 2022-07-26 NOTE — Progress Notes (Signed)
SDW CALL  Patient was given pre-op instructions over the phone. Patient verbalized understanding of instructions provided.   PCP - Dr. Daiva Eves Cardiologist -  denies  Chest x-ray - n/a EKG - 06-08-22 Stress Test - denies ECHO - denies Cardiac Cath - denies  Sleep Study - denies CPAP -   Blood Thinner Instructions: Aspirin Instructions: Reports hasn't taken ASA in 1 month  ERAS Protcol - clears until 0700 PRE-SURGERY Ensure or G2-   COVID TEST- n/a   Anesthesia review: No  Patient denies shortness of breath, fever, cough and chest pain over the phone call

## 2022-07-27 ENCOUNTER — Inpatient Hospital Stay (HOSPITAL_COMMUNITY): Payer: Medicare Other

## 2022-07-27 ENCOUNTER — Inpatient Hospital Stay (HOSPITAL_COMMUNITY): Payer: Medicare Other | Admitting: Anesthesiology

## 2022-07-27 ENCOUNTER — Inpatient Hospital Stay (HOSPITAL_COMMUNITY)
Admission: RE | Admit: 2022-07-27 | Discharge: 2022-07-28 | DRG: 481 | Disposition: A | Discharge status: Home Health | Payer: Medicare Other | Attending: Orthopedic Surgery | Admitting: Orthopedic Surgery

## 2022-07-27 ENCOUNTER — Encounter (HOSPITAL_COMMUNITY)
Admission: RE | Disposition: A | Discharge status: Home / Self Care | Payer: Self-pay | Source: Home / Self Care | Attending: Orthopedic Surgery

## 2022-07-27 ENCOUNTER — Other Ambulatory Visit: Payer: Self-pay

## 2022-07-27 ENCOUNTER — Encounter (HOSPITAL_COMMUNITY): Payer: Self-pay | Admitting: Orthopedic Surgery

## 2022-07-27 DIAGNOSIS — E785 Hyperlipidemia, unspecified: Secondary | ICD-10-CM | POA: Diagnosis present

## 2022-07-27 DIAGNOSIS — Z87891 Personal history of nicotine dependence: Secondary | ICD-10-CM

## 2022-07-27 DIAGNOSIS — D62 Acute posthemorrhagic anemia: Secondary | ICD-10-CM | POA: Diagnosis not present

## 2022-07-27 DIAGNOSIS — S72142K Displaced intertrochanteric fracture of left femur, subsequent encounter for closed fracture with nonunion: Secondary | ICD-10-CM | POA: Diagnosis not present

## 2022-07-27 DIAGNOSIS — M84750K Atypical femoral fracture, unspecified, subsequent encounter for fracture with nonunion: Secondary | ICD-10-CM | POA: Diagnosis not present

## 2022-07-27 DIAGNOSIS — I10 Essential (primary) hypertension: Secondary | ICD-10-CM | POA: Diagnosis present

## 2022-07-27 DIAGNOSIS — S72352K Displaced comminuted fracture of shaft of left femur, subsequent encounter for closed fracture with nonunion: Secondary | ICD-10-CM

## 2022-07-27 DIAGNOSIS — S72002K Fracture of unspecified part of neck of left femur, subsequent encounter for closed fracture with nonunion: Secondary | ICD-10-CM | POA: Diagnosis present

## 2022-07-27 DIAGNOSIS — S7290XK Unspecified fracture of unspecified femur, subsequent encounter for closed fracture with nonunion: Secondary | ICD-10-CM | POA: Diagnosis not present

## 2022-07-27 DIAGNOSIS — Z9889 Other specified postprocedural states: Secondary | ICD-10-CM | POA: Diagnosis not present

## 2022-07-27 DIAGNOSIS — S72052K Unspecified fracture of head of left femur, subsequent encounter for closed fracture with nonunion: Secondary | ICD-10-CM | POA: Diagnosis not present

## 2022-07-27 DIAGNOSIS — Z79899 Other long term (current) drug therapy: Secondary | ICD-10-CM

## 2022-07-27 DIAGNOSIS — Z7982 Long term (current) use of aspirin: Secondary | ICD-10-CM | POA: Diagnosis not present

## 2022-07-27 DIAGNOSIS — S7222XA Displaced subtrochanteric fracture of left femur, initial encounter for closed fracture: Secondary | ICD-10-CM

## 2022-07-27 DIAGNOSIS — F17200 Nicotine dependence, unspecified, uncomplicated: Secondary | ICD-10-CM | POA: Diagnosis present

## 2022-07-27 DIAGNOSIS — S72002A Fracture of unspecified part of neck of left femur, initial encounter for closed fracture: Secondary | ICD-10-CM | POA: Diagnosis not present

## 2022-07-27 DIAGNOSIS — Z8249 Family history of ischemic heart disease and other diseases of the circulatory system: Secondary | ICD-10-CM | POA: Diagnosis not present

## 2022-07-27 HISTORY — PX: FEMUR IM NAIL: SHX1597

## 2022-07-27 HISTORY — PX: HARDWARE REMOVAL: SHX979

## 2022-07-27 LAB — CBC WITH DIFFERENTIAL/PLATELET
Abs Immature Granulocytes: 0.02 10*3/uL (ref 0.00–0.07)
Basophils Absolute: 0 10*3/uL (ref 0.0–0.1)
Basophils Relative: 0 %
Eosinophils Absolute: 0.2 10*3/uL (ref 0.0–0.5)
Eosinophils Relative: 2 %
HCT: 41 % (ref 39.0–52.0)
Hemoglobin: 13 g/dL (ref 13.0–17.0)
Immature Granulocytes: 0 %
Lymphocytes Relative: 19 %
Lymphs Abs: 1.5 10*3/uL (ref 0.7–4.0)
MCH: 29.6 pg (ref 26.0–34.0)
MCHC: 31.7 g/dL (ref 30.0–36.0)
MCV: 93.4 fL (ref 80.0–100.0)
Monocytes Absolute: 0.3 10*3/uL (ref 0.1–1.0)
Monocytes Relative: 4 %
Neutro Abs: 5.7 10*3/uL (ref 1.7–7.7)
Neutrophils Relative %: 75 %
Platelets: 405 10*3/uL — ABNORMAL HIGH (ref 150–400)
RBC: 4.39 MIL/uL (ref 4.22–5.81)
RDW: 14 % (ref 11.5–15.5)
WBC: 7.6 10*3/uL (ref 4.0–10.5)
nRBC: 0 % (ref 0.0–0.2)

## 2022-07-27 LAB — COMPREHENSIVE METABOLIC PANEL
ALT: 10 U/L (ref 0–44)
AST: 17 U/L (ref 15–41)
Albumin: 3.6 g/dL (ref 3.5–5.0)
Alkaline Phosphatase: 141 U/L — ABNORMAL HIGH (ref 38–126)
Anion gap: 8 (ref 5–15)
BUN: <5 mg/dL — ABNORMAL LOW (ref 8–23)
CO2: 28 mmol/L (ref 22–32)
Calcium: 9.1 mg/dL (ref 8.9–10.3)
Chloride: 92 mmol/L — ABNORMAL LOW (ref 98–111)
Creatinine, Ser: 0.62 mg/dL (ref 0.61–1.24)
GFR, Estimated: >60 mL/min (ref >60)
Glucose, Bld: 91 mg/dL (ref 70–99)
Potassium: 3.6 mmol/L (ref 3.5–5.1)
Sodium: 128 mmol/L — ABNORMAL LOW (ref 135–145)
Total Bilirubin: 0.3 mg/dL (ref 0.3–1.2)
Total Protein: 7.7 g/dL (ref 6.5–8.1)

## 2022-07-27 LAB — PROTIME-INR
INR: 1 (ref 0.8–1.2)
Prothrombin Time: 12.9 seconds (ref 11.4–15.2)

## 2022-07-27 LAB — TYPE AND SCREEN
ABO/RH(D): O POS
Antibody Screen: NEGATIVE

## 2022-07-27 SURGERY — REMOVAL, HARDWARE
Anesthesia: General | Site: Hip | Laterality: Left

## 2022-07-27 MED ORDER — DEXAMETHASONE SODIUM PHOSPHATE 10 MG/ML IJ SOLN
INTRAMUSCULAR | Status: AC
  Filled 2022-07-27: qty 1

## 2022-07-27 MED ORDER — ACETAMINOPHEN 325 MG PO TABS: 325.0000 mg | ORAL_TABLET | Freq: Four times a day (QID) | ORAL | Status: DC | PRN

## 2022-07-27 MED ORDER — TRIAMTERENE-HCTZ 75-50 MG PO TABS
1.0000 | ORAL_TABLET | Freq: Every morning | ORAL | Status: DC
  Filled 2022-07-27: qty 1

## 2022-07-27 MED ORDER — HYDROMORPHONE HCL 1 MG/ML IJ SOLN
INTRAMUSCULAR | Status: AC
  Filled 2022-07-27: qty 0.5

## 2022-07-27 MED ORDER — METOPROLOL TARTRATE 50 MG PO TABS
ORAL_TABLET | ORAL | Status: AC
  Filled 2022-07-27: qty 2

## 2022-07-27 MED ORDER — MIDAZOLAM HCL 2 MG/2ML IJ SOLN
INTRAMUSCULAR | Status: DC | PRN
  Administered 2022-07-27: 2 mg via INTRAVENOUS

## 2022-07-27 MED ORDER — PHENYLEPHRINE 80 MCG/ML (10ML) SYRINGE FOR IV PUSH (FOR BLOOD PRESSURE SUPPORT)
PREFILLED_SYRINGE | INTRAVENOUS | Status: DC | PRN
  Administered 2022-07-27: 40 ug via INTRAVENOUS
  Administered 2022-07-27 (×2): 160 ug via INTRAVENOUS

## 2022-07-27 MED ORDER — SODIUM CHLORIDE 0.9 % IR SOLN
Status: DC | PRN
  Administered 2022-07-27 (×2): 3000 mL

## 2022-07-27 MED ORDER — ONDANSETRON HCL 4 MG/2ML IJ SOLN
INTRAMUSCULAR | Status: DC | PRN
  Administered 2022-07-27: 4 mg via INTRAVENOUS

## 2022-07-27 MED ORDER — LABETALOL HCL 5 MG/ML IV SOLN
INTRAVENOUS | Status: DC | PRN
  Administered 2022-07-27: 5 mg via INTRAVENOUS

## 2022-07-27 MED ORDER — ONDANSETRON HCL 4 MG/2ML IJ SOLN: 4.0000 mg | Freq: Four times a day (QID) | INTRAMUSCULAR | Status: DC | PRN

## 2022-07-27 MED ORDER — CYCLOBENZAPRINE HCL 5 MG PO TABS: 5.0000 mg | ORAL_TABLET | Freq: Three times a day (TID) | ORAL | Status: DC | PRN

## 2022-07-27 MED ORDER — FENTANYL CITRATE (PF) 250 MCG/5ML IJ SOLN
INTRAMUSCULAR | Status: DC | PRN
  Administered 2022-07-27 (×3): 50 ug via INTRAVENOUS
  Administered 2022-07-27: 100 ug via INTRAVENOUS

## 2022-07-27 MED ORDER — ROCURONIUM BROMIDE 10 MG/ML (PF) SYRINGE
PREFILLED_SYRINGE | INTRAVENOUS | Status: DC | PRN
  Administered 2022-07-27: 60 mg via INTRAVENOUS
  Administered 2022-07-27: 20 mg via INTRAVENOUS
  Administered 2022-07-27: 10 mg via INTRAVENOUS
  Administered 2022-07-27: 30 mg via INTRAVENOUS

## 2022-07-27 MED ORDER — CHLORHEXIDINE GLUCONATE 0.12 % MT SOLN
15.0000 mL | Freq: Once | OROMUCOSAL | Status: AC
  Administered 2022-07-27: 15 mL via OROMUCOSAL
  Filled 2022-07-27: qty 15

## 2022-07-27 MED ORDER — DOCUSATE SODIUM 100 MG PO CAPS
100.0000 mg | ORAL_CAPSULE | Freq: Two times a day (BID) | ORAL | Status: DC
  Administered 2022-07-27 – 2022-07-28 (×2): 100 mg via ORAL
  Filled 2022-07-27 (×2): qty 1

## 2022-07-27 MED ORDER — METOPROLOL TARTRATE 50 MG PO TABS
100.0000 mg | ORAL_TABLET | Freq: Once | ORAL | Status: AC
  Administered 2022-07-27: 100 mg via ORAL

## 2022-07-27 MED ORDER — PROPOFOL 10 MG/ML IV BOLUS
INTRAVENOUS | Status: DC | PRN
  Administered 2022-07-27: 170 mg via INTRAVENOUS

## 2022-07-27 MED ORDER — CEFAZOLIN SODIUM-DEXTROSE 2-4 GM/100ML-% IV SOLN
2.0000 g | INTRAVENOUS | Status: AC
  Administered 2022-07-27: 2 g via INTRAVENOUS
  Filled 2022-07-27: qty 100

## 2022-07-27 MED ORDER — OXYCODONE HCL 5 MG PO TABS
5.0000 mg | ORAL_TABLET | ORAL | Status: DC | PRN
  Administered 2022-07-27: 5 mg via ORAL
  Filled 2022-07-27: qty 1

## 2022-07-27 MED ORDER — FENTANYL CITRATE (PF) 250 MCG/5ML IJ SOLN
INTRAMUSCULAR | Status: AC
  Filled 2022-07-27: qty 5

## 2022-07-27 MED ORDER — ACETAMINOPHEN 10 MG/ML IV SOLN
INTRAVENOUS | Status: AC
  Filled 2022-07-27: qty 100

## 2022-07-27 MED ORDER — CEFAZOLIN SODIUM-DEXTROSE 1-4 GM/50ML-% IV SOLN
1.0000 g | Freq: Four times a day (QID) | INTRAVENOUS | Status: AC
  Administered 2022-07-27 – 2022-07-28 (×3): 1 g via INTRAVENOUS
  Filled 2022-07-27 (×3): qty 50

## 2022-07-27 MED ORDER — LACTATED RINGERS IV SOLN: INTRAVENOUS | Status: DC

## 2022-07-27 MED ORDER — LIDOCAINE 2% (20 MG/ML) 5 ML SYRINGE
INTRAMUSCULAR | Status: DC | PRN
  Administered 2022-07-27: 80 mg via INTRAVENOUS

## 2022-07-27 MED ORDER — METOPROLOL TARTRATE 50 MG PO TABS
100.0000 mg | ORAL_TABLET | Freq: Two times a day (BID) | ORAL | Status: DC
  Administered 2022-07-27: 100 mg via ORAL
  Filled 2022-07-27 (×2): qty 2

## 2022-07-27 MED ORDER — SUGAMMADEX SODIUM 200 MG/2ML IV SOLN
INTRAVENOUS | Status: DC | PRN
  Administered 2022-07-27: 200 mg via INTRAVENOUS

## 2022-07-27 MED ORDER — FENTANYL CITRATE (PF) 100 MCG/2ML IJ SOLN
25.0000 ug | INTRAMUSCULAR | Status: DC | PRN
  Administered 2022-07-27 (×2): 25 ug via INTRAVENOUS

## 2022-07-27 MED ORDER — HYDROMORPHONE HCL 1 MG/ML IJ SOLN
INTRAMUSCULAR | Status: DC | PRN
  Administered 2022-07-27: .5 mg via INTRAVENOUS

## 2022-07-27 MED ORDER — ACETAMINOPHEN 500 MG PO TABS
1000.0000 mg | ORAL_TABLET | Freq: Four times a day (QID) | ORAL | Status: DC
  Administered 2022-07-27 – 2022-07-28 (×2): 1000 mg via ORAL
  Filled 2022-07-27 (×2): qty 2

## 2022-07-27 MED ORDER — ONDANSETRON HCL 4 MG/2ML IJ SOLN
INTRAMUSCULAR | Status: AC
  Filled 2022-07-27: qty 2

## 2022-07-27 MED ORDER — ASPIRIN 81 MG PO TBEC
81.0000 mg | DELAYED_RELEASE_TABLET | Freq: Every day | ORAL | Status: DC
  Administered 2022-07-27: 81 mg via ORAL
  Filled 2022-07-27: qty 1

## 2022-07-27 MED ORDER — POTASSIUM CHLORIDE IN NACL 20-0.9 MEQ/L-% IV SOLN
INTRAVENOUS | Status: DC
  Filled 2022-07-27: qty 1000

## 2022-07-27 MED ORDER — DEXAMETHASONE SODIUM PHOSPHATE 10 MG/ML IJ SOLN
INTRAMUSCULAR | Status: DC | PRN
  Administered 2022-07-27: 5 mg via INTRAVENOUS

## 2022-07-27 MED ORDER — ORAL CARE MOUTH RINSE: 15.0000 mL | Freq: Once | OROMUCOSAL | Status: AC

## 2022-07-27 MED ORDER — ROCURONIUM BROMIDE 10 MG/ML (PF) SYRINGE
PREFILLED_SYRINGE | INTRAVENOUS | Status: AC
  Filled 2022-07-27: qty 10

## 2022-07-27 MED ORDER — LIDOCAINE 2% (20 MG/ML) 5 ML SYRINGE
INTRAMUSCULAR | Status: AC
  Filled 2022-07-27: qty 5

## 2022-07-27 MED ORDER — METOCLOPRAMIDE HCL 5 MG PO TABS: 5.0000 mg | ORAL_TABLET | Freq: Three times a day (TID) | ORAL | Status: DC | PRN

## 2022-07-27 MED ORDER — STERILE WATER FOR IRRIGATION IR SOLN
Status: DC | PRN
  Administered 2022-07-27: 1000 mL

## 2022-07-27 MED ORDER — PROPOFOL 10 MG/ML IV BOLUS
INTRAVENOUS | Status: AC
  Filled 2022-07-27: qty 20

## 2022-07-27 MED ORDER — ENOXAPARIN SODIUM 40 MG/0.4ML IJ SOSY
40.0000 mg | PREFILLED_SYRINGE | INTRAMUSCULAR | Status: DC
  Administered 2022-07-28: 40 mg via SUBCUTANEOUS
  Filled 2022-07-27: qty 0.4

## 2022-07-27 MED ORDER — METOCLOPRAMIDE HCL 5 MG/ML IJ SOLN: 5.0000 mg | Freq: Three times a day (TID) | INTRAMUSCULAR | Status: DC | PRN

## 2022-07-27 MED ORDER — ACETAMINOPHEN 10 MG/ML IV SOLN
1000.0000 mg | Freq: Once | INTRAVENOUS | Status: DC | PRN
  Administered 2022-07-27: 1000 mg via INTRAVENOUS

## 2022-07-27 MED ORDER — OXYCODONE HCL 5 MG PO TABS
10.0000 mg | ORAL_TABLET | ORAL | Status: DC | PRN
  Administered 2022-07-28: 10 mg via ORAL
  Filled 2022-07-27: qty 2

## 2022-07-27 MED ORDER — MIDAZOLAM HCL 2 MG/2ML IJ SOLN
INTRAMUSCULAR | Status: AC
  Filled 2022-07-27: qty 2

## 2022-07-27 MED ORDER — ONDANSETRON HCL 4 MG PO TABS: 4.0000 mg | ORAL_TABLET | Freq: Four times a day (QID) | ORAL | Status: DC | PRN

## 2022-07-27 MED ORDER — FENTANYL CITRATE (PF) 100 MCG/2ML IJ SOLN
INTRAMUSCULAR | Status: AC
  Filled 2022-07-27: qty 2

## 2022-07-27 MED ORDER — 0.9 % SODIUM CHLORIDE (POUR BTL) OPTIME
TOPICAL | Status: DC | PRN
  Administered 2022-07-27: 1000 mL

## 2022-07-27 MED ORDER — HYDROMORPHONE HCL 1 MG/ML IJ SOLN: 0.5000 mg | INTRAMUSCULAR | Status: DC | PRN

## 2022-07-27 SURGICAL SUPPLY — 86 items
BAG COUNTER SPONGE SURGICOUNT (BAG) ×2 IMPLANT
BANDAGE ESMARK 6X9 LF (GAUZE/BANDAGES/DRESSINGS) ×2 IMPLANT
BIT DRILL 60/10 CANN QC (BIT) IMPLANT
BNDG COHESIVE 6X5 TAN ST LF (GAUZE/BANDAGES/DRESSINGS) IMPLANT
BNDG COHESIVE 6X5 TAN STRL LF (GAUZE/BANDAGES/DRESSINGS) ×2 IMPLANT
BNDG ELASTIC 4X5.8 VLCR STR LF (GAUZE/BANDAGES/DRESSINGS) ×2 IMPLANT
BNDG ELASTIC 6X5.8 VLCR STR LF (GAUZE/BANDAGES/DRESSINGS) ×2 IMPLANT
BNDG ESMARK 6X9 LF (GAUZE/BANDAGES/DRESSINGS)
BNDG GAUZE DERMACEA FLUFF 4 (GAUZE/BANDAGES/DRESSINGS) ×4 IMPLANT
BONE CHIP PRESERV 40CC PCAN1/2 (Bone Implant) ×2 IMPLANT
BRUSH SCRUB EZ PLAIN DRY (MISCELLANEOUS) ×4 IMPLANT
COVER PERINEAL POST (MISCELLANEOUS) ×2 IMPLANT
COVER SURGICAL LIGHT HANDLE (MISCELLANEOUS) ×4 IMPLANT
CUFF TOURN SGL QUICK 18X4 (TOURNIQUET CUFF) IMPLANT
CUFF TOURN SGL QUICK 24 (TOURNIQUET CUFF)
CUFF TOURN SGL QUICK 34 (TOURNIQUET CUFF)
CUFF TRNQT CYL 24X4X16.5-23 (TOURNIQUET CUFF) IMPLANT
CUFF TRNQT CYL 34X4.125X (TOURNIQUET CUFF) IMPLANT
DRAPE C-ARM 42X72 X-RAY (DRAPES) IMPLANT
DRAPE C-ARMOR (DRAPES) ×2 IMPLANT
DRAPE HALF SHEET 40X57 (DRAPES) IMPLANT
DRAPE INCISE IOBAN 66X45 STRL (DRAPES) IMPLANT
DRAPE ORTHO SPLIT 77X108 STRL (DRAPES) ×4
DRAPE PERI GROIN 82X75IN TIB (DRAPES) IMPLANT
DRAPE SURG ORHT 6 SPLT 77X108 (DRAPES) ×4 IMPLANT
DRAPE U-SHAPE 47X51 STRL (DRAPES) ×2 IMPLANT
DRESSING MEPILEX FLEX 4X4 (GAUZE/BANDAGES/DRESSINGS) IMPLANT
DRSG ADAPTIC 3X8 NADH LF (GAUZE/BANDAGES/DRESSINGS) ×2 IMPLANT
DRSG MEPILEX BORDER 4X4 (GAUZE/BANDAGES/DRESSINGS) ×2 IMPLANT
DRSG MEPILEX BORDER 4X8 (GAUZE/BANDAGES/DRESSINGS) ×2 IMPLANT
DRSG MEPILEX FLEX 4X4 (GAUZE/BANDAGES/DRESSINGS) ×2
DRSG MEPILEX POST OP 4X8 (GAUZE/BANDAGES/DRESSINGS) IMPLANT
ELECT REM PT RETURN 9FT ADLT (ELECTROSURGICAL) ×2
ELECTRODE REM PT RTRN 9FT ADLT (ELECTROSURGICAL) ×2 IMPLANT
GAUZE SPONGE 4X4 12PLY STRL (GAUZE/BANDAGES/DRESSINGS) ×2 IMPLANT
GLOVE BIO SURGEON STRL SZ7.5 (GLOVE) ×2 IMPLANT
GLOVE BIO SURGEON STRL SZ8 (GLOVE) ×2 IMPLANT
GLOVE BIOGEL PI IND STRL 7.5 (GLOVE) ×2 IMPLANT
GLOVE BIOGEL PI IND STRL 8 (GLOVE) ×2 IMPLANT
GLOVE SURG ORTHO LTX SZ7.5 (GLOVE) ×4 IMPLANT
GOWN STRL REUS W/ TWL LRG LVL3 (GOWN DISPOSABLE) ×4 IMPLANT
GOWN STRL REUS W/ TWL XL LVL3 (GOWN DISPOSABLE) ×2 IMPLANT
GOWN STRL REUS W/TWL LRG LVL3 (GOWN DISPOSABLE) ×4
GOWN STRL REUS W/TWL XL LVL3 (GOWN DISPOSABLE) ×2
GRAFT BNE CANC CHIPS 1-8 40CC (Bone Implant) IMPLANT
GUIDEWIRE 3.2X400 (WIRE) IMPLANT
KIT BASIN OR (CUSTOM PROCEDURE TRAY) ×2 IMPLANT
KIT BONE HARVEST RIA 2 (ORTHOPEDIC DISPOSABLE SUPPLIES) IMPLANT
KIT TURNOVER KIT B (KITS) ×2 IMPLANT
MANIFOLD NEPTUNE II (INSTRUMENTS) ×2 IMPLANT
NDL 22X1.5 STRL (OR ONLY) (MISCELLANEOUS) IMPLANT
NEEDLE 22X1.5 STRL (OR ONLY) (MISCELLANEOUS) IMPLANT
NS IRRIG 1000ML POUR BTL (IV SOLUTION) ×2 IMPLANT
PACK GENERAL/GYN (CUSTOM PROCEDURE TRAY) ×2 IMPLANT
PACK ORTHO EXTREMITY (CUSTOM PROCEDURE TRAY) ×2 IMPLANT
PAD ARMBOARD 7.5X6 YLW CONV (MISCELLANEOUS) ×4 IMPLANT
PADDING CAST COTTON 6X4 STRL (CAST SUPPLIES) ×6 IMPLANT
PENCIL BUTTON HOLSTER BLD 10FT (ELECTRODE) IMPLANT
REAMER HEAD RIA 2 13.5 (INSTRUMENTS) IMPLANT
REAMER HEAD RIA 2 14.0 (INSTRUMENTS) IMPLANT
REAMER ROD DEEP FLUTE 2.5X950 (INSTRUMENTS) IMPLANT
SPONGE T-LAP 18X18 ~~LOC~~+RFID (SPONGE) ×2 IMPLANT
STAPLER VISISTAT 35W (STAPLE) ×2 IMPLANT
STOCKINETTE IMPERVIOUS LG (DRAPES) ×2 IMPLANT
STRIP CLOSURE SKIN 1/2X4 (GAUZE/BANDAGES/DRESSINGS) IMPLANT
SUCTION FRAZIER HANDLE 10FR (MISCELLANEOUS)
SUCTION TUBE FRAZIER 10FR DISP (MISCELLANEOUS) IMPLANT
SUT ETHILON 2 0 FS 18 (SUTURE) ×2 IMPLANT
SUT ETHILON 2 0 PSLX (SUTURE) IMPLANT
SUT PDS AB 0 CT1 27 (SUTURE) IMPLANT
SUT PDS AB 1 CT1 36 (SUTURE) IMPLANT
SUT PDS AB 2-0 CT1 27 (SUTURE) IMPLANT
SUT VIC AB 0 CT1 27 (SUTURE) ×2
SUT VIC AB 0 CT1 27XBRD ANBCTR (SUTURE) ×2 IMPLANT
SUT VIC AB 1 CT1 27 (SUTURE)
SUT VIC AB 1 CT1 27XBRD ANBCTR (SUTURE) ×2 IMPLANT
SUT VIC AB 2-0 CT1 27 (SUTURE) ×2
SUT VIC AB 2-0 CT1 TAPERPNT 27 (SUTURE) ×2 IMPLANT
SYR BULB IRRIG 60ML STRL (SYRINGE) IMPLANT
SYR CONTROL 10ML LL (SYRINGE) IMPLANT
TOWEL GREEN STERILE (TOWEL DISPOSABLE) ×4 IMPLANT
TOWEL GREEN STERILE FF (TOWEL DISPOSABLE) ×4 IMPLANT
TUBE CONNECTING 12X1/4 (SUCTIONS) ×2 IMPLANT
UNDERPAD 30X36 HEAVY ABSORB (UNDERPADS AND DIAPERS) ×2 IMPLANT
WATER STERILE IRR 1000ML POUR (IV SOLUTION) ×4 IMPLANT
YANKAUER SUCT BULB TIP NO VENT (SUCTIONS) ×2 IMPLANT

## 2022-07-27 NOTE — Anesthesia Preprocedure Evaluation (Addendum)
Anesthesia Evaluation  Patient identified by MRN, date of birth, ID band Patient awake    Reviewed: Allergy & Precautions, NPO status , Patient's Chart, lab work & pertinent test results  Airway Mallampati: II  TM Distance: >3 FB Neck ROM: Full    Dental  (+) Poor Dentition, Missing   Pulmonary former smoker   Pulmonary exam normal        Cardiovascular hypertension, Pt. on medications and Pt. on home beta blockers  Rhythm:Regular Rate:Normal     Neuro/Psych negative neurological ROS  negative psych ROS   GI/Hepatic negative GI ROS, Neg liver ROS,,,  Endo/Other  negative endocrine ROS    Renal/GU negative Renal ROS  negative genitourinary   Musculoskeletal negative musculoskeletal ROS (+)    Abdominal Normal abdominal exam  (+)   Peds  Hematology negative hematology ROS (+)   Anesthesia Other Findings   Reproductive/Obstetrics                             Anesthesia Physical Anesthesia Plan  ASA: 2  Anesthesia Plan: General   Post-op Pain Management:    Induction: Intravenous  PONV Risk Score and Plan: 2 and Ondansetron, Dexamethasone and Treatment may vary due to age or medical condition  Airway Management Planned: Mask and Oral ETT  Additional Equipment: None  Intra-op Plan:   Post-operative Plan: Extubation in OR  Informed Consent: I have reviewed the patients History and Physical, chart, labs and discussed the procedure including the risks, benefits and alternatives for the proposed anesthesia with the patient or authorized representative who has indicated his/her understanding and acceptance.     Dental advisory given  Plan Discussed with: CRNA  Anesthesia Plan Comments: (Lab Results      Component                Value               Date                      WBC                      8.1                 06/10/2022                HGB                      8.6 (L)              06/10/2022                HCT                      25.2 (L)            06/10/2022                MCV                      93.0                06/10/2022                PLT                      319  06/10/2022             Lab Results      Component                Value               Date                      NA                       132 (L)             06/10/2022                K                        3.6                 06/10/2022                CO2                      28                  06/10/2022                GLUCOSE                  109 (H)             06/10/2022                BUN                      <5 (L)              06/10/2022                CREATININE               0.61                06/10/2022                CALCIUM                  8.0 (L)             06/10/2022                GFRNONAA                 >60                 06/10/2022           )       Anesthesia Quick Evaluation

## 2022-07-27 NOTE — Transfer of Care (Signed)
Immediate Anesthesia Transfer of Care Note  Patient: Jonathon Anderson  Procedure(s) Performed: REPAIR OF LEFT PROXIMAL FEMUR NONUNION, REMOVAL OF ANTIBIOTIC SPACER, REAMED INTERMEDULLARY ASPRIATE RIGHT FEMUR (Bilateral: Hip) REPAIR FEMUR WITH AUTOGRAFT/ RIA (Left)  Patient Location: PACU  Anesthesia Type:General  Level of Consciousness: awake and patient cooperative  Airway & Oxygen Therapy: Patient Spontanous Breathing and Patient connected to face mask oxygen  Post-op Assessment: Report given to RN, Post -op Vital signs reviewed and stable, and Patient moving all extremities  Post vital signs: Reviewed and stable  Last Vitals:  Vitals Value Taken Time  BP 147/83 07/27/22 1416  Temp 98.5 07/27/22 1416  Pulse 85 07/27/22 1419  Resp 16 07/27/22 1419  SpO2 100 % 07/27/22 1419  Vitals shown include unvalidated device data.  Last Pain:  Vitals:   07/27/22 0808  TempSrc:   PainSc: 8       Patients Stated Pain Goal: 3 (38/75/64 3329)  Complications: No notable events documented.

## 2022-07-27 NOTE — Progress Notes (Signed)
Assessment completed at [phone]

## 2022-07-27 NOTE — Anesthesia Procedure Notes (Signed)
Procedure Name: Intubation Date/Time: 07/27/2022 10:36 AM  Performed by: Elvin So, CRNAPre-anesthesia Checklist: Patient identified, Emergency Drugs available, Suction available and Patient being monitored Patient Re-evaluated:Patient Re-evaluated prior to induction Oxygen Delivery Method: Circle System Utilized Preoxygenation: Pre-oxygenation with 100% oxygen Induction Type: IV induction Ventilation: Mask ventilation without difficulty Laryngoscope Size: Mac and 4 Grade View: Grade I Tube type: Oral Tube size: 7.5 mm Number of attempts: 1 Airway Equipment and Method: Stylet and Oral airway Placement Confirmation: ETT inserted through vocal cords under direct vision, positive ETCO2 and breath sounds checked- equal and bilateral Secured at: 22 cm Tube secured with: Tape Dental Injury: Teeth and Oropharynx as per pre-operative assessment

## 2022-07-28 ENCOUNTER — Other Ambulatory Visit (HOSPITAL_COMMUNITY): Payer: Self-pay

## 2022-07-28 LAB — CBC
HCT: 27.1 % — ABNORMAL LOW (ref 39.0–52.0)
Hemoglobin: 9.2 g/dL — ABNORMAL LOW (ref 13.0–17.0)
MCH: 30.7 pg (ref 26.0–34.0)
MCHC: 33.9 g/dL (ref 30.0–36.0)
MCV: 90.3 fL (ref 80.0–100.0)
Platelets: 394 10*3/uL (ref 150–400)
RBC: 3 MIL/uL — ABNORMAL LOW (ref 4.22–5.81)
RDW: 13.9 % (ref 11.5–15.5)
WBC: 11.1 10*3/uL — ABNORMAL HIGH (ref 4.0–10.5)
nRBC: 0 % (ref 0.0–0.2)

## 2022-07-28 MED ORDER — OXYCODONE-ACETAMINOPHEN 5-325 MG PO TABS
1.0000 | ORAL_TABLET | Freq: Four times a day (QID) | ORAL | 0 refills | Status: DC | PRN
  Filled 2022-07-28: qty 50, 7d supply, fill #0

## 2022-07-28 MED ORDER — CYCLOBENZAPRINE HCL 10 MG PO TABS
10.0000 mg | ORAL_TABLET | Freq: Two times a day (BID) | ORAL | 0 refills | Status: DC | PRN
  Filled 2022-07-28: qty 40, 20d supply, fill #0

## 2022-07-28 NOTE — Progress Notes (Signed)
Nursing dc note  Patient alert and oriented,both patient and brother Jonathon Anderson verbalized understanding of dc instructions. All belongings and toc meds given to patient.

## 2022-07-28 NOTE — Discharge Summary (Signed)
Orthopaedic Trauma Service (OTS) Discharge Summary   Patient ID: Jonathon Anderson MRN: [id number] DOB/AGE: April 08, 1956 67 y.o.  Admit date: 07/27/2022 Discharge date: 07/28/2022  Admission Diagnoses: Left proximal femur nonunion Hypertension Nicotine dependence  Discharge Diagnoses:  Principal Problem:   Atypical fracture of femur with nonunion Active Problems:   Hypertension   Nicotine dependence   Closed fracture of proximal end of left femur with nonunion   Past Medical History:  Diagnosis Date   Hyperlipidemia    Hypertension    Nicotine dependence      Procedures Performed: 07/27/2022 Dr. Marcelino Scot  Repair of left proximal femur nonunion Removal of antibiotic spacer left proximal femur Autografting and allografting left proximal femur.  Autograft obtained from reamed intramedullary aspirate from right femur  Discharged Condition: good  Hospital Course:   Patient is a 67 year old male well-known to the orthopedic trauma service who presented this hospitalization for his second stage of left proximal femur nonunion repair.  Approximately 6 weeks ago he underwent removal of hardware and repair of his nonunion with blade plate and placement of antibiotic spacer.  Presents for this admission for removal of his spacer and grafting of the nonunion site.  Patient tolerated the procedure well.  After surgery he was transferred to the PACU for recovery from anesthesia and then transferred to the orthopedic floor for observation, pain control and therapies.  He did really well overnight.  No issues were noted.  Patient was comfortable to discharge on postoperative day #1.  He will remain touchdown weightbearing on his left leg for another 2 weeks and then weight-bear as tolerated thereafter.  He will start outpatient therapy likely after his first postoperative follow-up.  He does not require any additional pharmacologic DVT prophylaxis as he was covered with Eliquis for 30 days  following his initial procedure.  He will continue with over-the-counter vitamin D supplementation.  Again he will continue to cease all nicotine products to optimize his capacity to heal.  Patient discharged in stable condition on 07/28/2022.  He did receive appropriate antibiotics perioperatively as well  Consults: None  Significant Diagnostic Studies: labs:   Latest Reference Range & Units 07/28/22 05:04  WBC 4.0 - 10.5 K/uL 11.1 (H)  RBC 4.22 - 5.81 MIL/uL 3.00 (L)  Hemoglobin 13.0 - 17.0 g/dL 9.2 (L)  HCT 39.0 - 52.0 % 27.1 (L)  MCV 80.0 - 100.0 fL 90.3  MCH 26.0 - 34.0 pg 30.7  MCHC 30.0 - 36.0 g/dL 33.9  RDW 11.5 - 15.5 % 13.9  Platelets 150 - 400 K/uL 394  nRBC 0.0 - 0.2 % 0.0  (H): Data is abnormally high (L): Data is abnormally low   Treatments: IV hydration, antibiotics: Ancef, analgesia: acetaminophen and oxycodone, anticoagulation: LMW heparin, therapies: PT, and surgery: As above  Discharge Exam:     Orthopaedic Trauma Service Progress Note   Patient ID: Jonathon Anderson MRN: [id number] DOB/AGE: 06/20/1956 67 y.o.   Subjective:   Doing well Pain very well-controlled Mild soreness right femur graft and donor site Left thigh with minimal pain   Wants to go home today   No chest pain or shortness of breath No nausea or vomiting   ROS As above   Objective:    VITALS:         Vitals:    07/27/22 1650 07/27/22 1934 07/28/22 0439 07/28/22 0930  BP:   134/80 92/75 121/64  Pulse: 82 95 96 (!) 101  Resp: 16 18 18 18   Temp:  97.9 F (36.6 C) 98.3 F (36.8 C) 98.4 F (36.9 C) 98.2 F (36.8 C)  TempSrc: Oral Oral Oral    SpO2:   98% 100% 100%  Weight:          Height:              Estimated body mass index is 31.62 kg/m as calculated from the following:   Height as of this encounter: 5' 8.5" (1.74 m).   Weight as of this encounter: 95.7 kg.     Intake/Output      01/04 0701 01/05 0700 01/05 0701 01/06 0700   P.O. 880    I.V. (mL/kg) 3.6 (0)     IV Piggyback 150    Total Intake(mL/kg) 1033.6 (10.8)    Urine (mL/kg/hr) 2250    Blood 200    Total Output 2450    Net -1416.4            LABS   Lab Results Last 24 Hours       Results for orders placed or performed during the hospital encounter of 07/27/22 (from the past 24 hour(s))  CBC     Status: Abnormal    Collection Time: 07/28/22  5:04 AM  Result Value Ref Range    WBC 11.1 (H) 4.0 - 10.5 K/uL    RBC 3.00 (L) 4.22 - 5.81 MIL/uL    Hemoglobin 9.2 (L) 13.0 - 17.0 g/dL    HCT 26.7 (L) 12.4 - 52.0 %    MCV 90.3 80.0 - 100.0 fL    MCH 30.7 26.0 - 34.0 pg    MCHC 33.9 30.0 - 36.0 g/dL    RDW 58.0 99.8 - 33.8 %    Platelets 394 150 - 400 K/uL    nRBC 0.0 0.0 - 0.2 %          PHYSICAL EXAM:    Gen: Sitting up in chair, appears well, pleasant, no acute distress Lungs: unlabored cardiac: reg Ext:       Right lower Extremity              Dressing R hip stable             Motor and sensory function intact right leg             No swelling             Ext warm              + DP pulse        Left Lower Extremity              Dressing L thigh stable             Ext warm              Minimal swelling              No DCT              Distal motor and sensory function intact             + DP pulse        Assessment/Plan: 1 Day Post-Op        Anti-infectives (From admission, onward)        Start     Dose/Rate Route Frequency Ordered Stop    07/27/22 1800   ceFAZolin (ANCEF) IVPB 1 g/50 mL premix        1 g 100 mL/hr over 30  Minutes Intravenous Every 6 hours 07/27/22 1653 07/28/22 0540    07/27/22 0800   ceFAZolin (ANCEF) IVPB 2g/100 mL premix        2 g 200 mL/hr over 30 Minutes Intravenous On call to O.R. 07/27/22 [id number] 07/27/22 1046         .   POD/HD#: 101   67 year old male repair left proximal femur nonunion, reamed intramedullary aspirate harvest right femur   -Left proximal femur nonunion s/p removal of antibiotic spacer and autografting with  RIA from R femur    Weightbearing Touchdown weightbearing left leg.  Continue to use walker               ROM/Activity                         Unrestricted range of motion of the left hip and knee                         No motion restrictions right leg                         Slowly increase activity level               Wound care                         Daily dressing changes starting on 07/30/2022                         Mepilex dressing or 4 x 4 gauze and tape               Continue with home exercise program             Outpatient PT once fully weightbearing and can begin strengthening program   - Pain management:             Multimodal             In office quick as possible   - ABL anemia/Hemodynamics             Stable   - Medical issues              Nicotine dependence                         Discussed the importance of smoking cessation for wound and bone healing                          - DVT/PE prophylaxis:             Patient completed Eliquis from his previous surgery             Does not require additional anticoagulation at this point - ID:              Perioperative antibiotics completed     - FEN/GI prophylaxis/Foley/Lines:             Diet   - Impediments to fracture healing:             Nicotine dependence             Established nonunion   - Dispo:             Discharge home today after therapy  Follow-up with orthopedics in 2 weeks for suture removal and x-rays.  Likely referral to outpatient PT at that time    Disposition: Discharge disposition: 01-Home or Self Care       Discharge Instructions     Call MD / Call 911   Complete by: As directed    If you experience chest pain or shortness of breath, CALL 911 and be transported to the hospital emergency room.  If you develope a fever above 101 F, pus (white drainage) or increased drainage or redness at the wound, or calf pain, call your surgeon's office.   Constipation  Prevention   Complete by: As directed    Drink plenty of fluids.  Prune juice may be helpful.  You may use a stool softener, such as Colace (over the counter) 100 mg twice a day.  Use MiraLax (over the counter) for constipation as needed.   Diet general   Complete by: As directed    Discharge instructions   Complete by: As directed    Orthopaedic Trauma Service Discharge Instructions   General Discharge Instructions  WEIGHT BEARING STATUS: Touchdown weightbearing left leg.  Weight-bear as tolerated right leg using walker  RANGE OF MOTION/ACTIVITY: Unrestricted range of motion both hips and knees.  Slowly increase activity  Bone health: Continue with vitamin D supplementation  Review the following resource for additional information regarding bone health  [email or web address].com[employer]cy  Wound Care: Daily wound care starting on 07/30/2022.  See below.   Discharge Wound Care Instructions  Do NOT apply any ointments, solutions or lotions to pin sites or surgical wounds.  These prevent needed drainage and even though solutions like hydrogen peroxide kill bacteria, they also damage cells lining the pin sites that help fight infection.  Applying lotions or ointments can keep the wounds moist and can cause them to breakdown and open up as well. This can increase the risk for infection. When in doubt call the office.  Surgical incisions should be dressed daily.  If any drainage is noted, use one layer of adaptic or Mepitel, then gauze, and tape.  Alternatively you can use Mepilex type dressing which is also known as a silicone foam dressing.  NetCamper[employer]cz https://dennis-soto.com/?pd_rd_i=[email or web address]&th=1  http://rojas.com/  These dressing supplies should be available at local medical  supply stores ([employer], [employer], etc). They are not usually carried at places like [facility], [facility], [employer], etc  Once the incision is completely dry and without drainage, it may be left open to air out.  Showering may begin 36-48 hours later.  Cleaning gently with soap and water.   Diet: as you were eating previously.  Can use over the counter stool softeners and bowel preparations, such as Miralax, to help with bowel movements.  Narcotics can be constipating.  Be sure to drink plenty of fluids  PAIN MEDICATION USE AND EXPECTATIONS  You have likely been given narcotic medications to help control your pain.  After a traumatic event that results in an fracture (broken bone) with or without surgery, it is ok to use narcotic pain medications to help control one's pain.  We understand that everyone responds to pain differently and each individual patient will be evaluated on a regular basis for the continued need for narcotic medications. Ideally, narcotic medication use should last no more than 6-8 weeks (coinciding with fracture healing).   As a patient it is your responsibility as well to monitor narcotic medication use and report the amount and frequency you use these medications when you  come to your office visit.   We would also advise that if you are using narcotic medications, you should take a dose prior to therapy to maximize you participation.  IF YOU ARE ON NARCOTIC MEDICATIONS IT IS NOT PERMISSIBLE TO OPERATE A MOTOR VEHICLE (MOTORCYCLE/CAR/TRUCK/MOPED) OR HEAVY MACHINERY DO NOT MIX NARCOTICS WITH OTHER CNS (CENTRAL NERVOUS SYSTEM) DEPRESSANTS SUCH AS ALCOHOL   POST-OPERATIVE OPIOID TAPER INSTRUCTIONS:  It is important to wean off of your opioid medication as soon as possible. If you do not need pain medication after your surgery it is ok to stop day one.  Opioids include:  o Codeine, Hydrocodone(Norco, Vicodin), Oxycodone(Percocet, oxycontin) and hydromorphone amongst  others.   Long term and even short term use of opiods can cause:  o Increased pain response  o Dependence  o Constipation  o Depression  o Respiratory depression  o And more.   Withdrawal symptoms can include  o Flu like symptoms  o Nausea, vomiting  o And more  Techniques to manage these symptoms  o Hydrate well  o Eat regular healthy meals  o Stay active  o Use relaxation techniques(deep breathing, meditating, yoga)  Do Not substitute Alcohol to help with tapering  If you have been on opioids for less than two weeks and do not have pain than it is ok to stop all together.   Plan to wean off of opioids  o This plan should start within one week post op of your fracture surgery   o Maintain the same interval or time between taking each dose and first decrease the dose.   o Cut the total daily intake of opioids by one tablet each day  o Next start to increase the time between doses.  o The last dose that should be eliminated is the evening dose.    STOP SMOKING OR USING NICOTINE PRODUCTS!!!!  As discussed nicotine severely impairs your body's ability to heal surgical and traumatic wounds but also impairs bone healing.  Wounds and bone heal by forming microscopic blood vessels (angiogenesis) and nicotine is a vasoconstrictor (essentially, shrinks blood vessels).  Therefore, if vasoconstriction occurs to these microscopic blood vessels they essentially disappear and are unable to deliver necessary nutrients to the healing tissue.  This is one modifiable factor that you can do to dramatically increase your chances of healing your injury.    (This means no smoking, no nicotine gum, patches, etc)  DO NOT USE NONSTEROIDAL ANTI-INFLAMMATORY DRUGS (NSAID'S)  Using products such as Advil (ibuprofen), Aleve (naproxen), Motrin (ibuprofen) for additional pain control during fracture healing can delay and/or prevent the healing response.  If you would like to take over the counter (OTC)  medication, Tylenol (acetaminophen) is ok.  However, some narcotic medications that are given for pain control contain acetaminophen as well. Therefore, you should not exceed more than 4000 mg of tylenol in a day if you do not have liver disease.  Also note that there are may OTC medicines, such as cold medicines and allergy medicines that my contain tylenol as well.  If you have any questions about medications and/or interactions please ask your doctor/PA or your pharmacist.      ICE AND ELEVATE INJURED/OPERATIVE EXTREMITY  Using ice and elevating the injured extremity above your heart can help with swelling and pain control.  Icing in a pulsatile fashion, such as 20 minutes on and 20 minutes off, can be followed.    Do not place ice directly on skin. Make sure there  is a barrier between to skin and the ice pack.    Using frozen items such as frozen peas works well as the conform nicely to the are that needs to be iced.  USE AN ACE WRAP OR TED HOSE FOR SWELLING CONTROL  In addition to icing and elevation, Ace wraps or TED hose are used to help limit and resolve swelling.  It is recommended to use Ace wraps or TED hose until you are informed to stop.    When using Ace Wraps start the wrapping distally (farthest away from the body) and wrap proximally (closer to the body)   Example: If you had surgery on your leg or thing and you do not have a splint on, start the ace wrap at the toes and work your way up to the thigh        If you had surgery on your upper extremity and do not have a splint on, start the ace wrap at your fingers and work your way up to the upper arm  IF YOU ARE IN A SPLINT OR CAST DO NOT [place]   If your splint gets wet for any reason please contact the office immediately. You may shower in your splint or cast as long as you keep it dry.  This can be done by wrapping in a cast cover or garbage back (or similar)  Do Not stick any thing down your splint or cast such  as pencils, money, or hangers to try and scratch yourself with.  If you feel itchy take benadryl as prescribed on the bottle for itching  IF YOU ARE IN A CAM BOOT (BLACK BOOT)  You may remove boot periodically. Perform daily dressing changes as noted below.  Wash the liner of the boot regularly and wear a sock when wearing the boot. It is recommended that you sleep in the boot until told otherwise    Call office for the following: ? Temperature greater than 101F ? Persistent nausea and vomiting ? Severe uncontrolled pain ? Redness, tenderness, or signs of infection (pain, swelling, redness, odor or green/yellow discharge around the site) ? Difficulty breathing, headache or visual disturbances ? Hives ? Persistent dizziness or light-headedness ? Extreme fatigue ? Any other questions or concerns you may have after discharge  In an emergency, call 911 or go to an Emergency Department at a nearby hospital  HELPFUL INFORMATION  ? If you had a block, it will wear off between 8-24 hrs postop typically.  This is period when your pain may go from nearly zero to the pain you would have had postop without the block.  This is an abrupt transition but nothing dangerous is happening.  You may take an extra dose of narcotic when this happens.  ? You should wean off your narcotic medicines as soon as you are able.  Most patients will be off or using minimal narcotics before their first postop appointment.   ? We suggest you use the pain medication the first night prior to going to bed, in order to ease any pain when the anesthesia wears off. You should avoid taking pain medications on an empty stomach as it will make you nauseous.  ? Do not drink alcoholic beverages or take illicit drugs when taking pain medications.  ? In most states it is against the law to drive while you are in a splint or sling.  And certainly against the law to drive while taking narcotics.  ?  You may return to work/school in  the next couple of days when you feel up to it.   ? Pain medication may make you constipated.  Below are a few solutions to try in this order:   ? Decrease the amount of pain medication if you aren't having pain.   ? Drink lots of decaffeinated fluids.   ? Drink prune juice and/or each dried prunes   o If the first 3 don't work start with additional solutions   ? Take Colace - an over-the-counter stool softener   ? Take Senokot - an over-the-counter laxative   ? Take Miralax - a stronger over-the-counter laxative     CALL THE OFFICE WITH ANY QUESTIONS OR CONCERNS: [phone]   VISIT OUR WEBSITE FOR ADDITIONAL INFORMATION: [email or web address]   Increase activity slowly as tolerated   Complete by: As directed    Post-operative opioid taper instructions:   Complete by: As directed    POST-OPERATIVE OPIOID TAPER INSTRUCTIONS: It is important to wean off of your opioid medication as soon as possible. If you do not need pain medication after your surgery it is ok to stop day one. Opioids include: Codeine, Hydrocodone(Norco, Vicodin), Oxycodone(Percocet, oxycontin) and hydromorphone amongst others.  Long term and even short term use of opiods can cause: Increased pain response Dependence Constipation Depression Respiratory depression And more.  Withdrawal symptoms can include Flu like symptoms Nausea, vomiting And more Techniques to manage these symptoms Hydrate well Eat regular healthy meals Stay active Use relaxation techniques(deep breathing, meditating, yoga) Do Not substitute Alcohol to help with tapering If you have been on opioids for less than two weeks and do not have pain than it is ok to stop all together.  Plan to wean off of opioids This plan should start within one week post op of your joint replacement. Maintain the same interval or time between taking each dose and first decrease the dose.  Cut the total daily intake of opioids by one tablet each day Next  start to increase the time between doses. The last dose that should be eliminated is the evening dose.      Touch down weight bearing   Complete by: As directed    Laterality: left   Extremity: Lower      Allergies as of 07/28/2022   No Known Allergies      Medication List     STOP taking these medications    apixaban 2.5 MG Tabs tablet Commonly known as: ELIQUIS       TAKE these medications    acetaminophen 500 MG tablet Commonly known as: TYLENOL Take 1 tablet (500 mg total) by mouth every 12 (twelve) hours as needed for mild pain. What changed: Another medication with the same name was removed. Continue taking this medication, and follow the directions you see here.   aspirin EC 81 MG tablet Take 81 mg by mouth at bedtime. Swallow whole.   atorvastatin 20 MG tablet Commonly known as: LIPITOR Take 20 mg by mouth at bedtime.   BIOFREEZE EX Apply 1 Application topically daily as needed (pain).   cyclobenzaprine 10 MG tablet Commonly known as: FLEXERIL Take 1 tablet (10 mg total) by mouth every 12 (twelve) hours as needed for muscle spasms. What changed: when to take this   diclofenac 75 MG EC tablet Commonly known as: VOLTAREN Take 75 mg by mouth 2 (two) times daily.   docusate sodium 100 MG capsule Commonly known as: COLACE Take 1 capsule (100 mg total)  by mouth 2 (two) times daily.   Fish Oil 1200 MG Caps Take 1,200 mg by mouth 2 (two) times daily.   metoprolol tartrate 100 MG tablet Commonly known as: LOPRESSOR Take 100 mg by mouth 2 (two) times daily.   oxyCODONE-acetaminophen 5-325 MG tablet Commonly known as: Percocet Take 1-2 tablets by mouth every 6 (six) hours as needed for severe pain or moderate pain.   Potassium 99 MG Tabs Take 99 mg by mouth daily.   thiamine 100 MG tablet Commonly known as: VITAMIN B1 Take 1 tablet (100 mg total) by mouth daily.   triamterene-hydrochlorothiazide 75-50 MG tablet Commonly known as: MAXZIDE Take 1  tablet by mouth every morning.               Discharge Care Instructions  (From admission, onward)           Start     Ordered   07/28/22 0000  Touch down weight bearing       Question Answer Comment  Laterality left   Extremity Lower      07/28/22 0958            Follow-up Information     Myrene Galas, MD. Schedule an appointment as soon as possible for a visit in 2 week(s).   Specialty: Orthopedic Surgery Contact information: [address] [place] [address] [phone]                  Signed:  [name], PA-C [phone] (C) 07/28/2022, 9:58 AM  [facility] [address] [place] [address] [phone] (F)

## 2022-07-28 NOTE — Discharge Instructions (Signed)
[facility]th[facility] Trauma Service Discharge Instructions   General Discharge Instructions  WEIGHT BEARING STATUS: Touchdown weightbearing left leg.  Weight-bear as tolerated right leg using walker  RANGE OF MOTION/ACTIVITY: Unrestricted range of motion both hips and knees.  Slowly increase activity  Bone health: Continue with vitamin D supplementation  Review the following resource for additional information regarding bone health  [email or web address]  Wound Care: Daily wound care starting on 07/30/2022.  See below.   Discharge Wound Care Instructions  Do NOT apply any ointments, solutions or lotions to pin sites or surgical wounds.  These prevent needed drainage and even though solutions like hydrogen peroxide kill bacteria, they also damage cells lining the pin sites that help fight infection.  Applying lotions or ointments can keep the wounds moist and can cause them to breakdown and open up as well. This can increase the risk for infection. When in doubt call the office.  Surgical incisions should be dressed daily.  If any drainage is noted, use one layer of adaptic or Mepitel, then gauze, and tape.  Alternatively you can use Mepilex type dressing which is also known as a silicone foam dressing.  [email or web address]  [email or web address].gl  These dressing supplies should be available at local medical supply stores ([facility], [facility], etc). They are not usually carried at places like [facility], [facility], [facility], etc  Once the incision is completely dry and without drainage, it may be left open to air out.  Showering may begin 36-48 hours later.  Cleaning gently with soap and water.   Diet: as you were eating previously.   Can use over the counter stool softeners and bowel preparations, such as Miralax, to help with bowel movements.  Narcotics can be constipating.  Be sure to drink plenty of fluids  PAIN MEDICATION USE AND EXPECTATIONS  You have likely been given narcotic medications to help control your pain.  After a traumatic event that results in an fracture (broken bone) with or without surgery, it is ok to use narcotic pain medications to help control one's pain.  We understand that everyone responds to pain differently and each individual patient will be evaluated on a regular basis for the continued need for narcotic medications. Ideally, narcotic medication use should last no more than 6-8 weeks (coinciding with fracture healing).   As a patient it is your responsibility as well to monitor narcotic medication use and report the amount and frequency you use these medications when you come to your office visit.   We would also advise that if you are using narcotic medications, you should take a dose prior to therapy to maximize you participation.  IF YOU ARE ON NARCOTIC MEDICATIONS IT IS NOT PERMISSIBLE TO OPERATE A MOTOR VEHICLE (MOTORCYCLE/CAR/TRUCK/MOPED) OR HEAVY MACHINERY DO NOT MIX NARCOTICS WITH OTHER CNS (CENTRAL NERVOUS SYSTEM) DEPRESSANTS SUCH AS ALCOHOL   POST-OPERATIVE OPIOID TAPER INSTRUCTIONS: It is important to wean off of your opioid medication as soon as possible. If you do not need pain medication after your surgery it is ok to stop day one. Opioids include: Codeine, Hydrocodone(Norco, Vicodin), Oxycodone(Percocet, oxycontin) and hydromorphone amongst others.  Long term and even short term use of opiods can cause: Increased pain response Dependence Constipation Depression Respiratory depression And more.  Withdrawal symptoms can include Flu like symptoms Nausea, vomiting And more Techniques to manage these symptoms Hydrate well Eat regular healthy meals Stay active Use relaxation  techniques(deep breathing, meditating, yoga) Do Not substitute Alcohol to help with tapering If you have  been on opioids for less than two weeks and do not have pain than it is ok to stop all together.  Plan to wean off of opioids This plan should start within one week post op of your fracture surgery  Maintain the same interval or time between taking each dose and first decrease the dose.  Cut the total daily intake of opioids by one tablet each day Next start to increase the time between doses. The last dose that should be eliminated is the evening dose.    STOP SMOKING OR USING NICOTINE PRODUCTS!!!!  As discussed nicotine severely impairs your body's ability to heal surgical and traumatic wounds but also impairs bone healing.  Wounds and bone heal by forming microscopic blood vessels (angiogenesis) and nicotine is a vasoconstrictor (essentially, shrinks blood vessels).  Therefore, if vasoconstriction occurs to these microscopic blood vessels they essentially disappear and are unable to deliver necessary nutrients to the healing tissue.  This is one modifiable factor that you can do to dramatically increase your chances of healing your injury.    (This means no smoking, no nicotine gum, patches, etc)  DO NOT USE NONSTEROIDAL ANTI-INFLAMMATORY DRUGS (NSAID'S)  Using products such as Advil (ibuprofen), Aleve (naproxen), Motrin (ibuprofen) for additional pain control during fracture healing can delay and/or prevent the healing response.  If you would like to take over the counter (OTC) medication, Tylenol (acetaminophen) is ok.  However, some narcotic medications that are given for pain control contain acetaminophen as well. Therefore, you should not exceed more than 4000 mg of tylenol in a day if you do not have liver disease.  Also note that there are may OTC medicines, such as cold medicines and allergy medicines that my contain tylenol as well.  If you have any questions about medications and/or  interactions please ask your doctor/PA or your pharmacist.      ICE AND ELEVATE INJURED/OPERATIVE EXTREMITY  Using ice and elevating the injured extremity above your heart can help with swelling and pain control.  Icing in a pulsatile fashion, such as 20 minutes on and 20 minutes off, can be followed.    Do not place ice directly on skin. Make sure there is a barrier between to skin and the ice pack.    Using frozen items such as frozen peas works well as the conform nicely to the are that needs to be iced.  USE AN ACE WRAP OR TED HOSE FOR SWELLING CONTROL  In addition to icing and elevation, Ace wraps or TED hose are used to help limit and resolve swelling.  It is recommended to use Ace wraps or TED hose until you are informed to stop.    When using Ace Wraps start the wrapping distally (farthest away from the body) and wrap proximally (closer to the body)   Example: If you had surgery on your leg or thing and you do not have a splint on, start the ace wrap at the toes and work your way up to the thigh        If you had surgery on your upper extremity and do not have a splint on, start the ace wrap at your fingers and work your way up to the upper arm  IF YOU ARE IN A SPLINT OR CAST DO NOT [place]   If your splint gets wet for any reason please contact the office immediately. You may shower in your splint or cast as long as you keep it  dry.  This can be done by wrapping in a cast cover or garbage back (or similar)  Do Not stick any thing down your splint or cast such as pencils, money, or hangers to try and scratch yourself with.  If you feel itchy take benadryl as prescribed on the bottle for itching  IF YOU ARE IN A CAM BOOT (BLACK BOOT)  You may remove boot periodically. Perform daily dressing changes as noted below.  Wash the liner of the boot regularly and wear a sock when wearing the boot. It is recommended that you sleep in the boot until told otherwise    Call office  for the following: Temperature greater than 101F Persistent nausea and vomiting Severe uncontrolled pain Redness, tenderness, or signs of infection (pain, swelling, redness, odor or green/yellow discharge around the site) Difficulty breathing, headache or visual disturbances Hives Persistent dizziness or light-headedness Extreme fatigue Any other questions or concerns you may have after discharge  In an emergency, call 911 or go to an Emergency Department at a nearby hospital  HELPFUL INFORMATION  If you had a block, it will wear off between 8-24 hrs postop typically.  This is period when your pain may go from nearly zero to the pain you would have had postop without the block.  This is an abrupt transition but nothing dangerous is happening.  You may take an extra dose of narcotic when this happens.  You should wean off your narcotic medicines as soon as you are able.  Most patients will be off or using minimal narcotics before their first postop appointment.   We suggest you use the pain medication the first night prior to going to bed, in order to ease any pain when the anesthesia wears off. You should avoid taking pain medications on an empty stomach as it will make you nauseous.  Do not drink alcoholic beverages or take illicit drugs when taking pain medications.  In most states it is against the law to drive while you are in a splint or sling.  And certainly against the law to drive while taking narcotics.  You may return to work/school in the next couple of days when you feel up to it.   Pain medication may make you constipated.  Below are a few solutions to try in this order: Decrease the amount of pain medication if you aren't having pain. Drink lots of decaffeinated fluids. Drink prune juice and/or each dried prunes  If the first 3 don't work start with additional solutions Take Colace - an over-the-counter stool softener Take Senokot - an over-the-counter laxative Take  Miralax - a stronger over-the-counter laxative     CALL THE OFFICE WITH ANY QUESTIONS OR CONCERNS: [phone]   VISIT OUR WEBSITE FOR ADDITIONAL INFORMATION: [email or web address]

## 2022-07-28 NOTE — TOC Initial Note (Signed)
Transition of Care [facility]) - Initial/Assessment Note    Patient Details  Name: Jonathon Anderson MRN: [id number] Date of Birth: 1956/07/10  [facility] CM/SW Contact:    Ninfa Meeker, RN Phone Number: 07/28/2022, 10:15 AM  Clinical Narrative:        Patient is a 66 yr old gentleman s/p removal of antibiotic spacer and grafting of non union site, Left femur. CM spoke with patient concerning need for Home Health therapy, he informed CM that he is already having therapy at home with [facility]. Case Manager confirmed this with [place], Caryl Pina. CM asked PA to enter resumption orders for Home Health.  Patient will have family support at discharge.             Expected Discharge Plan: [place] Barriers to Discharge: No Barriers Identified   Patient Goals and CMS Choice     Choice offered to / list presented to : Patient      Expected Discharge Plan and Services   Discharge Planning Services: CM Consult Post Acute Care Choice: Resumption of Svcs/PTA Provider Living arrangements for the past 2 months: Single Family Home Expected Discharge Date: 07/28/22               DME Arranged: N/A DME Agency: NA       HH Arranged: PT, OT HH Agency: [place] ([facility]) Date HH Agency Contacted: 07/28/22 Time HH Agency Contacted: 9 Representative spoke with at [place]: [place] Arrangements/Services Living arrangements for the past 2 months: [place] Lives with:: Siblings Patient language and need for interpreter reviewed:: Yes Do you feel safe going back to the place where you live?: Yes      Need for Family Participation in Patient Care: Yes (Comment) Care giver support system in place?: Yes (comment) Current home services: DME Criminal Activity/Legal Involvement Pertinent to Current Situation/Hospitalization: No - Comment as needed  Activities of Daily Living Home Assistive Devices/Equipment: Walker  (specify type), Cane (specify quad or straight) ADL Screening (condition at time of admission) Patient's cognitive ability adequate to safely complete daily activities?: Yes Is the patient deaf or have difficulty hearing?: No Does the patient have difficulty seeing, even when wearing glasses/contacts?: No Does the patient have difficulty concentrating, remembering, or making decisions?: No Patient able to express need for assistance with ADLs?: Yes Does the patient have difficulty dressing or bathing?: No Independently performs ADLs?: Yes (appropriate for developmental age) Does the patient have difficulty walking or climbing stairs?: Yes Weakness of Legs: Left Weakness of Arms/Hands: None  Permission Sought/Granted                  Emotional Assessment   Attitude/Demeanor/Rapport: Gracious   Orientation: : Oriented to Self, Oriented to Place, Oriented to  Time, Oriented to Situation Alcohol / Substance Use: Not Applicable Psych Involvement: No (comment)  Admission diagnosis:  Atypical fracture of femur with nonunion [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.750K] Closed fracture of proximal end of left femur with nonunion [S72.002K] 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 [place], [place] - [address] [place]  [address] Phone: [phone] Fax: [place] [address] [place] [address] Phone: [phone] Fax: [phone]     Social  Determinants of Health (SDOH) Social History: SDOH Screenings   Food Insecurity: No Food Insecurity (07/27/2022)  Housing: Low Risk  (07/27/2022)  Transportation Needs: No Transportation Needs (07/27/2022)  Utilities: Not At Risk (07/27/2022)  Tobacco Use: Medium Risk (07/27/2022)   SDOH Interventions:     Readmission Risk Interventions     No data to display

## 2022-07-28 NOTE — Anesthesia Postprocedure Evaluation (Signed)
Anesthesia Post Note  Patient: Jonathon Anderson  Procedure(s) Performed: REPAIR OF LEFT PROXIMAL FEMUR NONUNION, REMOVAL OF ANTIBIOTIC SPACER, REAMED INTERMEDULLARY ASPRIATE RIGHT FEMUR (Bilateral: Hip) REPAIR FEMUR WITH AUTOGRAFT/ RIA (Left)     Patient location during evaluation: PACU Anesthesia Type: General Level of consciousness: awake and alert Pain management: pain level controlled Vital Signs Assessment: post-procedure vital signs reviewed and stable Respiratory status: spontaneous breathing, nonlabored ventilation, respiratory function stable and patient connected to nasal cannula oxygen Cardiovascular status: blood pressure returned to baseline and stable Postop Assessment: no apparent nausea or vomiting Anesthetic complications: no   No notable events documented.  Last Vitals:  Vitals:   07/28/22 0439 07/28/22 0930  BP: 92/75 121/64  Pulse: 96 (!) 101  Resp: 18 18  Temp: 36.9 C 36.8 C  SpO2: 100% 100%    Last Pain:  Vitals:   07/28/22 0439  TempSrc: Oral  PainSc:                  March Rummage Shirline Kendle

## 2022-07-28 NOTE — Evaluation (Signed)
Occupational Therapy Evaluation Patient Details Name: Jonathon Anderson MRN: [id number] DOB: 10-Aug-1955 Today's Date: 07/28/2022   History of Present Illness 67 yo male admitted 1/3 for removal of antibiotic spacer and grafting of L femur. History of injury to L thigh had disruption of IM nailing from 2022, now has received bone graft and screws/plate for a non union.  PMHx:  HLD, HTN, smoker.   Clinical Impression   Pt presents to OT with decreased standing balance, decreased awareness into deficits, generalized weakness and post op pain that affects his ability to perform ADLs/IADLs with PLOF. He required mod A today with transfers and LB ADLs. His brother was present and reported he is comfortable providing the recommended assist. Extensive education and cueing required to adhere to LLE TDWB. Edu provided re navigating stairs at home, recommended use of a shower chair, and limitations ( pt stating "I will just walk up the stairs"). Ensured family understood limitations. OT did express concerns re d/c at this level but family confident in ability to help him. Pt would benefit from continued acute OT services to facilitate safe d/c home and optimize occupational performance.     Recommendations for follow up therapy are one component of a multi-disciplinary discharge planning process, led by the attending physician.  Recommendations may be updated based on patient status, additional functional criteria and insurance authorization.   Follow Up Recommendations  Home health OT     Assistance Recommended at Discharge Frequent or constant Supervision/Assistance  Patient can return home with the following A lot of help with bathing/dressing/bathroom;A lot of help with walking and/or transfers;Assistance with cooking/housework    Functional Status Assessment  Patient has had a recent decline in their functional status and demonstrates the ability to make significant improvements in function in a  reasonable and predictable amount of time.  Equipment Recommendations  Tub/shower seat    Recommendations for Other Services       Precautions / Restrictions Precautions Precautions: Fall Restrictions Weight Bearing Restrictions: Yes LLE Weight Bearing: Touchdown weight bearing      Mobility Bed Mobility Overal bed mobility: Modified Independent                  Transfers Overall transfer level: Needs assistance Equipment used: Rolling walker (2 wheels) Transfers: Sit to/from Stand, Bed to chair/wheelchair/BSC Sit to Stand: Mod assist Stand pivot transfers: Mod assist         General transfer comment: mod A when adhering to TDWB precautions using the RW      Balance Overall balance assessment: Needs assistance Sitting-balance support: No upper extremity supported Sitting balance-Leahy Scale: Good     Standing balance support: During functional activity, Bilateral upper extremity supported Standing balance-Leahy Scale: Poor                             ADL either performed or assessed with clinical judgement   ADL Overall ADL's : Needs assistance/impaired     Grooming: Wash/dry hands;Modified independent   Upper Body Bathing: Supervision/ safety   Lower Body Bathing: Moderate assistance;Sit to/from stand;Cueing for safety   Upper Body Dressing : Supervision/safety   Lower Body Dressing: Moderate assistance;Sit to/from stand;Cueing for sequencing   Toilet Transfer: Minimal assistance   Toileting- Clothing Manipulation and Hygiene: Moderate assistance       Functional mobility during ADLs: Moderate assistance;Rolling walker (2 wheels);Cueing for sequencing;Cueing for safety;Caregiver able to provide necessary level of assistance  Vision Baseline Vision/History: 0 No visual deficits Ability to See in Adequate Light: 0 Adequate Patient Visual Report: No change from baseline Vision Assessment?: No apparent visual deficits      Perception     Praxis      Pertinent Vitals/Pain Pain Assessment Pain Assessment: 0-10 Pain Score: 5  Pain Descriptors / Indicators: Aching Pain Intervention(s): Limited activity within patient's tolerance, Repositioned     Hand Dominance Right   Extremity/Trunk Assessment Upper Extremity Assessment Upper Extremity Assessment: Overall WFL for tasks assessed   Lower Extremity Assessment Lower Extremity Assessment: Defer to PT evaluation   Cervical / Trunk Assessment Cervical / Trunk Assessment: Normal   Communication Communication Communication: No difficulties   Cognition Arousal/Alertness: Awake/alert Behavior During Therapy: WFL for tasks assessed/performed Overall Cognitive Status: Within Functional Limits for tasks assessed                                 General Comments: Dismissive of OT education. Pulled out his IV himself to "get his discharge going"     General Comments  Poor adherence to WB precautions    Exercises     Shoulder Instructions      Home Living Family/patient expects to be discharged to:: Private residence Living Arrangements: Other relatives Available Help at Discharge: Family;Available 24 hours/day Type of Home: Mobile home Home Access: Stairs to enter Entrance Stairs-Number of Steps: 4 Entrance Stairs-Rails: Right;Left Home Layout: One level     Bathroom Shower/Tub: Other (comment)   Bathroom Toilet: Standard Bathroom Accessibility: Yes How Accessible: Accessible via walker Home Equipment: [place] (2 wheels);Cane - single point;Hand held shower head;BSC/3in1;Grab bars - tub/shower          Prior Functioning/Environment Prior Level of Function : Independent/Modified Independent               ADLs Comments: assisted for cooking and cleaning by sister, sister also helped him on stairs        OT Problem List: Decreased strength;Decreased coordination;Decreased activity tolerance;Decreased safety  awareness;Impaired balance (sitting and/or standing);Decreased knowledge of use of DME or AE;Decreased knowledge of precautions      OT Treatment/Interventions: Self-care/ADL training;Therapeutic exercise;Therapeutic activities;Energy conservation;DME and/or AE instruction;Patient/family education;Balance training    OT Goals(Current goals can be found in the care plan section) Acute Rehab OT Goals Patient Stated Goal: get home today OT Goal Formulation: With patient/family Time For Goal Achievement: 08/04/22 Potential to Achieve Goals: Fair  OT Frequency: Min 2X/week    Co-evaluation              AM-PAC OT "6 Clicks" Daily Activity     Outcome Measure Help from another person eating meals?: None Help from another person taking care of personal grooming?: None Help from another person toileting, which includes using toliet, bedpan, or urinal?: A Lot Help from another person bathing (including washing, rinsing, drying)?: A Lot Help from another person to put on and taking off regular upper body clothing?: None Help from another person to put on and taking off regular lower body clothing?: A Lot 6 Click Score: 18   End of Session Equipment Utilized During Treatment: Gait belt;Rolling walker (2 wheels) Nurse Communication: Mobility status  Activity Tolerance: Patient tolerated treatment well Patient left: in chair;with family/visitor present  OT Visit Diagnosis: Unsteadiness on feet (R26.81);Muscle weakness (generalized) (M62.81);History of falling (Z91.81)  Time: 1020-1037 OT Time Calculation (min): 17 min Charges:  OT General Charges $OT Visit: 1 Visit OT Evaluation $OT Eval Low Complexity: 1 Low  Jake Shark, OTR/L, CBIS Acute Rehab Office: [phone]   Crissie Reese 07/28/2022, 10:48 AM

## 2022-07-28 NOTE — Progress Notes (Signed)
Orthopaedic Trauma Service Progress Note  Patient ID: LAYMON STOCKERT MRN: [id number] DOB/AGE: Jan 01, 1956 67 y.o.  Subjective:  Doing well Pain very well-controlled Mild soreness right femur graft and donor site Left thigh with minimal pain  Wants to go home today  No chest pain or shortness of breath No nausea or vomiting  ROS As above  Objective:   VITALS:   Vitals:   07/27/22 1650 07/27/22 1934 07/28/22 0439 07/28/22 0930  BP:  134/80 92/75 121/64  Pulse: 82 95 96 (!) 101  Resp: 16 18 18 18   Temp: 97.9 F (36.6 C) 98.3 F (36.8 C) 98.4 F (36.9 C) 98.2 F (36.8 C)  TempSrc: Oral Oral Oral   SpO2:  98% 100% 100%  Weight:      Height:        Estimated body mass index is 31.62 kg/m as calculated from the following:   Height as of this encounter: 5' 8.5" (1.74 m).   Weight as of this encounter: 95.7 kg.   Intake/Output      01/04 0701 01/05 0700 01/05 0701 01/06 0700   P.O. 880    I.V. (mL/kg) 3.6 (0)    IV Piggyback 150    Total Intake(mL/kg) 1033.6 (10.8)    Urine (mL/kg/hr) 2250    Blood 200    Total Output 2450    Net -1416.4           LABS  Results for orders placed or performed during the hospital encounter of 07/27/22 (from the past 24 hour(s))  CBC     Status: Abnormal   Collection Time: 07/28/22  5:04 AM  Result Value Ref Range   WBC 11.1 (H) 4.0 - 10.5 K/uL   RBC 3.00 (L) 4.22 - 5.81 MIL/uL   Hemoglobin 9.2 (L) 13.0 - 17.0 g/dL   HCT 27.1 (L) 39.0 - 52.0 %   MCV 90.3 80.0 - 100.0 fL   MCH 30.7 26.0 - 34.0 pg   MCHC 33.9 30.0 - 36.0 g/dL   RDW 13.9 11.5 - 15.5 %   Platelets 394 150 - 400 K/uL   nRBC 0.0 0.0 - 0.2 %     PHYSICAL EXAM:   Gen: Sitting up in chair, appears well, pleasant, no acute distress Lungs: unlabored cardiac: reg Ext:       Right lower Extremity   Dressing R hip stable  Motor and sensory function intact right leg  No  swelling  Ext warm   + DP pulse       Left Lower Extremity   Dressing L thigh stable  Ext warm   Minimal swelling   No DCT   Distal motor and sensory function intact  + DP pulse     Assessment/Plan: 1 Day Post-Op     Anti-infectives (From admission, onward)    Start     Dose/Rate Route Frequency Ordered Stop   07/27/22 1800  ceFAZolin (ANCEF) IVPB 1 g/50 mL premix        1 g 100 mL/hr over 30 Minutes Intravenous Every 6 hours 07/27/22 1653 07/28/22 0540   07/27/22 0800  ceFAZolin (ANCEF) IVPB 2g/100 mL premix        2 g 200 mL/hr over 30 Minutes Intravenous On call to O.R. 07/27/22 8938 07/27/22 1046     .  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  Continue to use walker   ROM/Activity   Unrestricted range of motion of the left hip and knee   No motion restrictions right leg   Slowly increase activity level   Wound care   Daily dressing changes starting on 07/30/2022   Mepilex dressing or 4 x 4 gauze and tape   Continue with home exercise program  Outpatient PT once fully weightbearing and can begin strengthening program  - Pain management:  Multimodal  In office quick as possible  - ABL anemia/Hemodynamics  Stable  - Medical issues   Nicotine dependence   Discussed the importance of smoking cessation for wound and bone healing    - DVT/PE prophylaxis:  Patient completed Eliquis from his previous surgery  Does not require additional anticoagulation at this point - ID:   Perioperative antibiotics completed   - FEN/GI prophylaxis/Foley/Lines:  Diet  - Impediments to fracture healing:  Nicotine dependence  Established nonunion  - Dispo:  Discharge home today after therapy  Follow-up with orthopedics in 2 weeks for  suture removal and x-rays.  Likely referral to outpatient PT at that time     Jari Pigg, PA-C [phone] (C) 07/28/2022, 9:45 AM  [facility] [place] [id number] [phone] Domingo Sep (F)    After 5pm and on the weekends please log on to Amion, go to orthopaedics and the look under the [facility] Call for the provider(s) on call. You can also call our office at [phone] and then follow the prompts to be connected to the call team.  Patient ID: Lesia Hausen, male   DOB: 1956-02-03, 67 y.o.   MRN: [id number]

## 2022-07-29 DIAGNOSIS — Z9181 History of falling: Secondary | ICD-10-CM | POA: Diagnosis not present

## 2022-07-29 DIAGNOSIS — E785 Hyperlipidemia, unspecified: Secondary | ICD-10-CM | POA: Diagnosis not present

## 2022-07-29 DIAGNOSIS — W19XXXD Unspecified fall, subsequent encounter: Secondary | ICD-10-CM | POA: Diagnosis not present

## 2022-07-29 DIAGNOSIS — I10 Essential (primary) hypertension: Secondary | ICD-10-CM | POA: Diagnosis not present

## 2022-07-29 DIAGNOSIS — Z5221 Bone donor, autologous: Secondary | ICD-10-CM | POA: Diagnosis not present

## 2022-07-29 DIAGNOSIS — F17210 Nicotine dependence, cigarettes, uncomplicated: Secondary | ICD-10-CM | POA: Diagnosis not present

## 2022-07-29 DIAGNOSIS — Z7982 Long term (current) use of aspirin: Secondary | ICD-10-CM | POA: Diagnosis not present

## 2022-07-29 DIAGNOSIS — S72142K Displaced intertrochanteric fracture of left femur, subsequent encounter for closed fracture with nonunion: Secondary | ICD-10-CM | POA: Diagnosis not present

## 2022-07-29 DIAGNOSIS — Z79891 Long term (current) use of opiate analgesic: Secondary | ICD-10-CM | POA: Diagnosis not present

## 2022-08-01 DIAGNOSIS — W19XXXD Unspecified fall, subsequent encounter: Secondary | ICD-10-CM | POA: Diagnosis not present

## 2022-08-01 DIAGNOSIS — S72142K Displaced intertrochanteric fracture of left femur, subsequent encounter for closed fracture with nonunion: Secondary | ICD-10-CM | POA: Diagnosis not present

## 2022-08-01 DIAGNOSIS — Z9181 History of falling: Secondary | ICD-10-CM | POA: Diagnosis not present

## 2022-08-01 DIAGNOSIS — I10 Essential (primary) hypertension: Secondary | ICD-10-CM | POA: Diagnosis not present

## 2022-08-01 DIAGNOSIS — F17210 Nicotine dependence, cigarettes, uncomplicated: Secondary | ICD-10-CM | POA: Diagnosis not present

## 2022-08-01 DIAGNOSIS — E785 Hyperlipidemia, unspecified: Secondary | ICD-10-CM | POA: Diagnosis not present

## 2022-08-02 ENCOUNTER — Encounter (HOSPITAL_COMMUNITY): Payer: Self-pay | Admitting: Orthopedic Surgery

## 2022-08-02 DIAGNOSIS — S72142K Displaced intertrochanteric fracture of left femur, subsequent encounter for closed fracture with nonunion: Secondary | ICD-10-CM | POA: Diagnosis not present

## 2022-08-02 DIAGNOSIS — E785 Hyperlipidemia, unspecified: Secondary | ICD-10-CM | POA: Diagnosis not present

## 2022-08-02 DIAGNOSIS — W19XXXD Unspecified fall, subsequent encounter: Secondary | ICD-10-CM | POA: Diagnosis not present

## 2022-08-02 DIAGNOSIS — F17210 Nicotine dependence, cigarettes, uncomplicated: Secondary | ICD-10-CM | POA: Diagnosis not present

## 2022-08-02 DIAGNOSIS — I10 Essential (primary) hypertension: Secondary | ICD-10-CM | POA: Diagnosis not present

## 2022-08-02 DIAGNOSIS — Z9181 History of falling: Secondary | ICD-10-CM | POA: Diagnosis not present

## 2022-08-04 DIAGNOSIS — E785 Hyperlipidemia, unspecified: Secondary | ICD-10-CM | POA: Diagnosis not present

## 2022-08-04 DIAGNOSIS — W19XXXD Unspecified fall, subsequent encounter: Secondary | ICD-10-CM | POA: Diagnosis not present

## 2022-08-04 DIAGNOSIS — S72142K Displaced intertrochanteric fracture of left femur, subsequent encounter for closed fracture with nonunion: Secondary | ICD-10-CM | POA: Diagnosis not present

## 2022-08-04 DIAGNOSIS — F17210 Nicotine dependence, cigarettes, uncomplicated: Secondary | ICD-10-CM | POA: Diagnosis not present

## 2022-08-04 DIAGNOSIS — Z9181 History of falling: Secondary | ICD-10-CM | POA: Diagnosis not present

## 2022-08-04 DIAGNOSIS — I10 Essential (primary) hypertension: Secondary | ICD-10-CM | POA: Diagnosis not present

## 2022-08-06 DIAGNOSIS — F17210 Nicotine dependence, cigarettes, uncomplicated: Secondary | ICD-10-CM | POA: Diagnosis not present

## 2022-08-06 DIAGNOSIS — W19XXXD Unspecified fall, subsequent encounter: Secondary | ICD-10-CM | POA: Diagnosis not present

## 2022-08-06 DIAGNOSIS — I10 Essential (primary) hypertension: Secondary | ICD-10-CM | POA: Diagnosis not present

## 2022-08-06 DIAGNOSIS — E785 Hyperlipidemia, unspecified: Secondary | ICD-10-CM | POA: Diagnosis not present

## 2022-08-06 DIAGNOSIS — Z9181 History of falling: Secondary | ICD-10-CM | POA: Diagnosis not present

## 2022-08-06 DIAGNOSIS — S72142K Displaced intertrochanteric fracture of left femur, subsequent encounter for closed fracture with nonunion: Secondary | ICD-10-CM | POA: Diagnosis not present

## 2022-08-07 DIAGNOSIS — Z9181 History of falling: Secondary | ICD-10-CM | POA: Diagnosis not present

## 2022-08-07 DIAGNOSIS — S72142K Displaced intertrochanteric fracture of left femur, subsequent encounter for closed fracture with nonunion: Secondary | ICD-10-CM | POA: Diagnosis not present

## 2022-08-07 DIAGNOSIS — E785 Hyperlipidemia, unspecified: Secondary | ICD-10-CM | POA: Diagnosis not present

## 2022-08-07 DIAGNOSIS — F17210 Nicotine dependence, cigarettes, uncomplicated: Secondary | ICD-10-CM | POA: Diagnosis not present

## 2022-08-07 DIAGNOSIS — I10 Essential (primary) hypertension: Secondary | ICD-10-CM | POA: Diagnosis not present

## 2022-08-07 DIAGNOSIS — W19XXXD Unspecified fall, subsequent encounter: Secondary | ICD-10-CM | POA: Diagnosis not present

## 2022-08-09 DIAGNOSIS — S7222XK Displaced subtrochanteric fracture of left femur, subsequent encounter for closed fracture with nonunion: Secondary | ICD-10-CM | POA: Diagnosis not present

## 2022-08-09 DIAGNOSIS — S72052K Unspecified fracture of head of left femur, subsequent encounter for closed fracture with nonunion: Secondary | ICD-10-CM | POA: Diagnosis not present

## 2022-08-11 DIAGNOSIS — W19XXXD Unspecified fall, subsequent encounter: Secondary | ICD-10-CM | POA: Diagnosis not present

## 2022-08-11 DIAGNOSIS — I10 Essential (primary) hypertension: Secondary | ICD-10-CM | POA: Diagnosis not present

## 2022-08-11 DIAGNOSIS — S72142K Displaced intertrochanteric fracture of left femur, subsequent encounter for closed fracture with nonunion: Secondary | ICD-10-CM | POA: Diagnosis not present

## 2022-08-11 DIAGNOSIS — F17210 Nicotine dependence, cigarettes, uncomplicated: Secondary | ICD-10-CM | POA: Diagnosis not present

## 2022-08-11 DIAGNOSIS — Z9181 History of falling: Secondary | ICD-10-CM | POA: Diagnosis not present

## 2022-08-11 DIAGNOSIS — E785 Hyperlipidemia, unspecified: Secondary | ICD-10-CM | POA: Diagnosis not present

## 2022-08-15 DIAGNOSIS — F17210 Nicotine dependence, cigarettes, uncomplicated: Secondary | ICD-10-CM | POA: Diagnosis not present

## 2022-08-15 DIAGNOSIS — Z9181 History of falling: Secondary | ICD-10-CM | POA: Diagnosis not present

## 2022-08-15 DIAGNOSIS — E785 Hyperlipidemia, unspecified: Secondary | ICD-10-CM | POA: Diagnosis not present

## 2022-08-15 DIAGNOSIS — I10 Essential (primary) hypertension: Secondary | ICD-10-CM | POA: Diagnosis not present

## 2022-08-15 DIAGNOSIS — S72142K Displaced intertrochanteric fracture of left femur, subsequent encounter for closed fracture with nonunion: Secondary | ICD-10-CM | POA: Diagnosis not present

## 2022-08-15 DIAGNOSIS — W19XXXD Unspecified fall, subsequent encounter: Secondary | ICD-10-CM | POA: Diagnosis not present

## 2022-08-17 ENCOUNTER — Other Ambulatory Visit (HOSPITAL_COMMUNITY): Payer: Self-pay

## 2022-08-17 DIAGNOSIS — I10 Essential (primary) hypertension: Secondary | ICD-10-CM | POA: Diagnosis not present

## 2022-08-17 DIAGNOSIS — E785 Hyperlipidemia, unspecified: Secondary | ICD-10-CM | POA: Diagnosis not present

## 2022-08-17 DIAGNOSIS — W19XXXD Unspecified fall, subsequent encounter: Secondary | ICD-10-CM | POA: Diagnosis not present

## 2022-08-17 DIAGNOSIS — S72142K Displaced intertrochanteric fracture of left femur, subsequent encounter for closed fracture with nonunion: Secondary | ICD-10-CM | POA: Diagnosis not present

## 2022-08-17 DIAGNOSIS — F17210 Nicotine dependence, cigarettes, uncomplicated: Secondary | ICD-10-CM | POA: Diagnosis not present

## 2022-08-17 DIAGNOSIS — Z9181 History of falling: Secondary | ICD-10-CM | POA: Diagnosis not present

## 2022-08-21 DIAGNOSIS — Z9181 History of falling: Secondary | ICD-10-CM | POA: Diagnosis not present

## 2022-08-21 DIAGNOSIS — E785 Hyperlipidemia, unspecified: Secondary | ICD-10-CM | POA: Diagnosis not present

## 2022-08-21 DIAGNOSIS — W19XXXD Unspecified fall, subsequent encounter: Secondary | ICD-10-CM | POA: Diagnosis not present

## 2022-08-21 DIAGNOSIS — I10 Essential (primary) hypertension: Secondary | ICD-10-CM | POA: Diagnosis not present

## 2022-08-21 DIAGNOSIS — F17210 Nicotine dependence, cigarettes, uncomplicated: Secondary | ICD-10-CM | POA: Diagnosis not present

## 2022-08-21 DIAGNOSIS — S72142K Displaced intertrochanteric fracture of left femur, subsequent encounter for closed fracture with nonunion: Secondary | ICD-10-CM | POA: Diagnosis not present

## 2022-08-24 DIAGNOSIS — W19XXXD Unspecified fall, subsequent encounter: Secondary | ICD-10-CM | POA: Diagnosis not present

## 2022-08-24 DIAGNOSIS — Z9181 History of falling: Secondary | ICD-10-CM | POA: Diagnosis not present

## 2022-08-24 DIAGNOSIS — S72142K Displaced intertrochanteric fracture of left femur, subsequent encounter for closed fracture with nonunion: Secondary | ICD-10-CM | POA: Diagnosis not present

## 2022-08-24 DIAGNOSIS — F17210 Nicotine dependence, cigarettes, uncomplicated: Secondary | ICD-10-CM | POA: Diagnosis not present

## 2022-08-24 DIAGNOSIS — E785 Hyperlipidemia, unspecified: Secondary | ICD-10-CM | POA: Diagnosis not present

## 2022-08-24 DIAGNOSIS — I10 Essential (primary) hypertension: Secondary | ICD-10-CM | POA: Diagnosis not present

## 2022-08-28 DIAGNOSIS — W19XXXD Unspecified fall, subsequent encounter: Secondary | ICD-10-CM | POA: Diagnosis not present

## 2022-08-28 DIAGNOSIS — Z7982 Long term (current) use of aspirin: Secondary | ICD-10-CM | POA: Diagnosis not present

## 2022-08-28 DIAGNOSIS — Z79891 Long term (current) use of opiate analgesic: Secondary | ICD-10-CM | POA: Diagnosis not present

## 2022-08-28 DIAGNOSIS — Z9181 History of falling: Secondary | ICD-10-CM | POA: Diagnosis not present

## 2022-08-28 DIAGNOSIS — E785 Hyperlipidemia, unspecified: Secondary | ICD-10-CM | POA: Diagnosis not present

## 2022-08-28 DIAGNOSIS — I10 Essential (primary) hypertension: Secondary | ICD-10-CM | POA: Diagnosis not present

## 2022-08-28 DIAGNOSIS — Z5221 Bone donor, autologous: Secondary | ICD-10-CM | POA: Diagnosis not present

## 2022-08-28 DIAGNOSIS — S72142K Displaced intertrochanteric fracture of left femur, subsequent encounter for closed fracture with nonunion: Secondary | ICD-10-CM | POA: Diagnosis not present

## 2022-08-28 DIAGNOSIS — F17210 Nicotine dependence, cigarettes, uncomplicated: Secondary | ICD-10-CM | POA: Diagnosis not present

## 2022-08-28 NOTE — Op Note (Signed)
[name], Anderson MEDICAL RECORD NO: [id number] ACCOUNT NO: [id number] DATE OF BIRTH: 1955/08/27 FACILITY: [facility] LOCATION: [facility] PHYSICIAN: Astrid Divine. Marcelino Scot, MD  Operative Report   DATE OF PROCEDURE: 07/27/2022  PREOPERATIVE DIAGNOSES: 1.  Left femur nonunion. 2.  Retained antibiotic spacer, left femur.  POSTOPERATIVE DIAGNOSES: 1.  Left femur nonunion. 2.  Retained antibiotic spacer, left femur.  PROCEDURES:  1.  Repair of left femur nonunion with reamed intramedullary aspirate autografting from the right femur. 2.  Removal of antibiotic spacer, left femur.  SURGEON:  Altamese Massac, MD  ASSISTANT:  None.  COMPLICATIONS:  None.  INPUT/OUTPUT: Please refer to the anesthetic record for a complete account.  DRAINS: None.  PATIENT DISPOSITION: To PACU.  CONDITION: Stable.  BRIEF SUMMARY OF INDICATIONS FOR PROCEDURE:  The patient is a 67 year old male with a long history of left femur nonunion and hardware complications, treated with repair using a blade plate and placement of an antibiotic spacer for induction of a  biologic membrane to achieve union of the large defect.  The patient now presents for staged removal of the antibiotic spacer and repair of the nonunion with autografting from reamed intramedullary aspiration of the right femur.  I did discuss with him  the risks and benefits of surgery including the possibility of infection, nerve injury, vessel injury, persistent nonunion contralateral leg fracture, DVT, PE, and need for further surgery among others.  After full discussion, the patient did wish to  proceed and provided consent to do so.  BRIEF SUMMARY OF PROCEDURE:  The patient was taken to the operating room where general anesthesia was induced.  Both lower extremities were prepped and draped in the usual sterile fashion using chlorhexidine soap and Betadine scrub and paint.  The  timeout was held and then C-arm brought in to identify the correct position  for exposure.  Dissection was then made through the old surgical scar after a 8 cm incision. The IT band was split midline and then the vastus with retraction of the tissues  anteriorly.  This was a difficult area of dissection because of the multiple prior surgeries.  A longitudinal incision was made of the membrane and then in piecemeal fashion, the spacer was removed.  I did need to use an osteotome to break it into  several pieces in order to facilitate this.  This area was packed and then attention turned to the right femur.  Here again C-arm was used to identify the correct starting point proximally as well as the best diameter for the aspirator.  Correct starting  point was identified with curved cannulated awl and then threaded guidepin followed by the opening reamer and then passage of the reamed intramedullary aspirator.  We collected healthy 60 mL of graft.  This was combined with 40 mL of cancellous chips  and then packed into the area of nonunion on the left. The membrane was reapproximated with #1 PDS suture.  Then, #1 Vicryl, 0 Vicryl and 2-0 Vicryl and 2-0 nylon.  Wounds were irrigated prior to closure but protecting the graft.  C-arm was used  throughout for orthogonal views during graft harvest and removal of the spacer and packing of the autograft.  PROGNOSIS: The patient will continue weightbearing as tolerated.  Plan to see him back in the office for removal of sutures in 2 weeks.  He remains at elevated risk for persistent nonunion given his longstanding nonunion as well as admission of smoking,  which is particularly deleterious.  The patient  has acknowledged this and agreed to be compliant with that.  He can resume aspirin for DVT prophylaxis.   SHW D: 08/28/2022 7:41:57 am T: 08/28/2022 9:19:00 am  JOB: [id number]

## 2022-08-29 DIAGNOSIS — S72142K Displaced intertrochanteric fracture of left femur, subsequent encounter for closed fracture with nonunion: Secondary | ICD-10-CM | POA: Diagnosis not present

## 2022-08-29 DIAGNOSIS — I10 Essential (primary) hypertension: Secondary | ICD-10-CM | POA: Diagnosis not present

## 2022-08-29 DIAGNOSIS — W19XXXD Unspecified fall, subsequent encounter: Secondary | ICD-10-CM | POA: Diagnosis not present

## 2022-08-29 DIAGNOSIS — Z9181 History of falling: Secondary | ICD-10-CM | POA: Diagnosis not present

## 2022-08-29 DIAGNOSIS — F17210 Nicotine dependence, cigarettes, uncomplicated: Secondary | ICD-10-CM | POA: Diagnosis not present

## 2022-08-29 DIAGNOSIS — E785 Hyperlipidemia, unspecified: Secondary | ICD-10-CM | POA: Diagnosis not present

## 2022-09-05 DIAGNOSIS — I10 Essential (primary) hypertension: Secondary | ICD-10-CM | POA: Diagnosis not present

## 2022-09-05 DIAGNOSIS — Z9181 History of falling: Secondary | ICD-10-CM | POA: Diagnosis not present

## 2022-09-05 DIAGNOSIS — F17210 Nicotine dependence, cigarettes, uncomplicated: Secondary | ICD-10-CM | POA: Diagnosis not present

## 2022-09-05 DIAGNOSIS — E785 Hyperlipidemia, unspecified: Secondary | ICD-10-CM | POA: Diagnosis not present

## 2022-09-05 DIAGNOSIS — S72142K Displaced intertrochanteric fracture of left femur, subsequent encounter for closed fracture with nonunion: Secondary | ICD-10-CM | POA: Diagnosis not present

## 2022-09-05 DIAGNOSIS — W19XXXD Unspecified fall, subsequent encounter: Secondary | ICD-10-CM | POA: Diagnosis not present

## 2022-09-13 DIAGNOSIS — S7222XK Displaced subtrochanteric fracture of left femur, subsequent encounter for closed fracture with nonunion: Secondary | ICD-10-CM | POA: Diagnosis not present

## 2022-09-13 DIAGNOSIS — S72052K Unspecified fracture of head of left femur, subsequent encounter for closed fracture with nonunion: Secondary | ICD-10-CM | POA: Diagnosis not present

## 2022-09-15 DIAGNOSIS — I10 Essential (primary) hypertension: Secondary | ICD-10-CM | POA: Diagnosis not present

## 2022-09-15 DIAGNOSIS — S72142K Displaced intertrochanteric fracture of left femur, subsequent encounter for closed fracture with nonunion: Secondary | ICD-10-CM | POA: Diagnosis not present

## 2022-09-15 DIAGNOSIS — Z9181 History of falling: Secondary | ICD-10-CM | POA: Diagnosis not present

## 2022-09-15 DIAGNOSIS — F17210 Nicotine dependence, cigarettes, uncomplicated: Secondary | ICD-10-CM | POA: Diagnosis not present

## 2022-09-15 DIAGNOSIS — W19XXXD Unspecified fall, subsequent encounter: Secondary | ICD-10-CM | POA: Diagnosis not present

## 2022-09-15 DIAGNOSIS — E785 Hyperlipidemia, unspecified: Secondary | ICD-10-CM | POA: Diagnosis not present

## 2022-09-22 DIAGNOSIS — I10 Essential (primary) hypertension: Secondary | ICD-10-CM | POA: Diagnosis not present

## 2022-09-22 DIAGNOSIS — E785 Hyperlipidemia, unspecified: Secondary | ICD-10-CM | POA: Diagnosis not present

## 2022-09-22 DIAGNOSIS — F17210 Nicotine dependence, cigarettes, uncomplicated: Secondary | ICD-10-CM | POA: Diagnosis not present

## 2022-09-22 DIAGNOSIS — Z9181 History of falling: Secondary | ICD-10-CM | POA: Diagnosis not present

## 2022-09-22 DIAGNOSIS — W19XXXD Unspecified fall, subsequent encounter: Secondary | ICD-10-CM | POA: Diagnosis not present

## 2022-09-22 DIAGNOSIS — S72142K Displaced intertrochanteric fracture of left femur, subsequent encounter for closed fracture with nonunion: Secondary | ICD-10-CM | POA: Diagnosis not present

## 2022-09-27 DIAGNOSIS — Z79891 Long term (current) use of opiate analgesic: Secondary | ICD-10-CM | POA: Diagnosis not present

## 2022-09-27 DIAGNOSIS — S72142K Displaced intertrochanteric fracture of left femur, subsequent encounter for closed fracture with nonunion: Secondary | ICD-10-CM | POA: Diagnosis not present

## 2022-09-27 DIAGNOSIS — Z9181 History of falling: Secondary | ICD-10-CM | POA: Diagnosis not present

## 2022-09-27 DIAGNOSIS — F17210 Nicotine dependence, cigarettes, uncomplicated: Secondary | ICD-10-CM | POA: Diagnosis not present

## 2022-09-27 DIAGNOSIS — Z5221 Bone donor, autologous: Secondary | ICD-10-CM | POA: Diagnosis not present

## 2022-09-27 DIAGNOSIS — Z7982 Long term (current) use of aspirin: Secondary | ICD-10-CM | POA: Diagnosis not present

## 2022-09-27 DIAGNOSIS — E785 Hyperlipidemia, unspecified: Secondary | ICD-10-CM | POA: Diagnosis not present

## 2022-09-27 DIAGNOSIS — I10 Essential (primary) hypertension: Secondary | ICD-10-CM | POA: Diagnosis not present

## 2022-09-28 DIAGNOSIS — E871 Hypo-osmolality and hyponatremia: Secondary | ICD-10-CM | POA: Diagnosis not present

## 2022-09-28 DIAGNOSIS — Z7982 Long term (current) use of aspirin: Secondary | ICD-10-CM | POA: Diagnosis not present

## 2022-09-28 DIAGNOSIS — M545 Low back pain, unspecified: Secondary | ICD-10-CM | POA: Diagnosis not present

## 2022-09-28 DIAGNOSIS — S72142K Displaced intertrochanteric fracture of left femur, subsequent encounter for closed fracture with nonunion: Secondary | ICD-10-CM | POA: Diagnosis not present

## 2022-09-28 DIAGNOSIS — E785 Hyperlipidemia, unspecified: Secondary | ICD-10-CM | POA: Diagnosis not present

## 2022-09-28 DIAGNOSIS — E782 Mixed hyperlipidemia: Secondary | ICD-10-CM | POA: Diagnosis not present

## 2022-09-28 DIAGNOSIS — Z79891 Long term (current) use of opiate analgesic: Secondary | ICD-10-CM | POA: Diagnosis not present

## 2022-09-28 DIAGNOSIS — F17210 Nicotine dependence, cigarettes, uncomplicated: Secondary | ICD-10-CM | POA: Diagnosis not present

## 2022-09-28 DIAGNOSIS — I10 Essential (primary) hypertension: Secondary | ICD-10-CM | POA: Diagnosis not present

## 2022-10-03 DIAGNOSIS — I10 Essential (primary) hypertension: Secondary | ICD-10-CM | POA: Diagnosis not present

## 2022-10-03 DIAGNOSIS — E785 Hyperlipidemia, unspecified: Secondary | ICD-10-CM | POA: Diagnosis not present

## 2022-10-03 DIAGNOSIS — Z7982 Long term (current) use of aspirin: Secondary | ICD-10-CM | POA: Diagnosis not present

## 2022-10-03 DIAGNOSIS — Z79891 Long term (current) use of opiate analgesic: Secondary | ICD-10-CM | POA: Diagnosis not present

## 2022-10-03 DIAGNOSIS — F17210 Nicotine dependence, cigarettes, uncomplicated: Secondary | ICD-10-CM | POA: Diagnosis not present

## 2022-10-03 DIAGNOSIS — S72142K Displaced intertrochanteric fracture of left femur, subsequent encounter for closed fracture with nonunion: Secondary | ICD-10-CM | POA: Diagnosis not present

## 2022-10-12 DIAGNOSIS — F17210 Nicotine dependence, cigarettes, uncomplicated: Secondary | ICD-10-CM | POA: Diagnosis not present

## 2022-10-12 DIAGNOSIS — E785 Hyperlipidemia, unspecified: Secondary | ICD-10-CM | POA: Diagnosis not present

## 2022-10-12 DIAGNOSIS — I10 Essential (primary) hypertension: Secondary | ICD-10-CM | POA: Diagnosis not present

## 2022-10-12 DIAGNOSIS — S72142K Displaced intertrochanteric fracture of left femur, subsequent encounter for closed fracture with nonunion: Secondary | ICD-10-CM | POA: Diagnosis not present

## 2022-10-12 DIAGNOSIS — Z79891 Long term (current) use of opiate analgesic: Secondary | ICD-10-CM | POA: Diagnosis not present

## 2022-10-12 DIAGNOSIS — Z7982 Long term (current) use of aspirin: Secondary | ICD-10-CM | POA: Diagnosis not present

## 2022-10-16 DIAGNOSIS — F17210 Nicotine dependence, cigarettes, uncomplicated: Secondary | ICD-10-CM | POA: Diagnosis not present

## 2022-10-16 DIAGNOSIS — Z79891 Long term (current) use of opiate analgesic: Secondary | ICD-10-CM | POA: Diagnosis not present

## 2022-10-16 DIAGNOSIS — E785 Hyperlipidemia, unspecified: Secondary | ICD-10-CM | POA: Diagnosis not present

## 2022-10-16 DIAGNOSIS — I10 Essential (primary) hypertension: Secondary | ICD-10-CM | POA: Diagnosis not present

## 2022-10-16 DIAGNOSIS — S72142K Displaced intertrochanteric fracture of left femur, subsequent encounter for closed fracture with nonunion: Secondary | ICD-10-CM | POA: Diagnosis not present

## 2022-10-16 DIAGNOSIS — Z7982 Long term (current) use of aspirin: Secondary | ICD-10-CM | POA: Diagnosis not present

## 2022-10-23 DIAGNOSIS — F17210 Nicotine dependence, cigarettes, uncomplicated: Secondary | ICD-10-CM | POA: Diagnosis not present

## 2022-10-23 DIAGNOSIS — Z7982 Long term (current) use of aspirin: Secondary | ICD-10-CM | POA: Diagnosis not present

## 2022-10-23 DIAGNOSIS — E785 Hyperlipidemia, unspecified: Secondary | ICD-10-CM | POA: Diagnosis not present

## 2022-10-23 DIAGNOSIS — Z79891 Long term (current) use of opiate analgesic: Secondary | ICD-10-CM | POA: Diagnosis not present

## 2022-10-23 DIAGNOSIS — I10 Essential (primary) hypertension: Secondary | ICD-10-CM | POA: Diagnosis not present

## 2022-10-23 DIAGNOSIS — S72142K Displaced intertrochanteric fracture of left femur, subsequent encounter for closed fracture with nonunion: Secondary | ICD-10-CM | POA: Diagnosis not present

## 2022-10-24 DIAGNOSIS — E871 Hypo-osmolality and hyponatremia: Secondary | ICD-10-CM | POA: Diagnosis not present

## 2022-10-27 DIAGNOSIS — Z5221 Bone donor, autologous: Secondary | ICD-10-CM | POA: Diagnosis not present

## 2022-10-27 DIAGNOSIS — E785 Hyperlipidemia, unspecified: Secondary | ICD-10-CM | POA: Diagnosis not present

## 2022-10-27 DIAGNOSIS — I10 Essential (primary) hypertension: Secondary | ICD-10-CM | POA: Diagnosis not present

## 2022-10-27 DIAGNOSIS — Z79891 Long term (current) use of opiate analgesic: Secondary | ICD-10-CM | POA: Diagnosis not present

## 2022-10-27 DIAGNOSIS — Z9181 History of falling: Secondary | ICD-10-CM | POA: Diagnosis not present

## 2022-10-27 DIAGNOSIS — S72142K Displaced intertrochanteric fracture of left femur, subsequent encounter for closed fracture with nonunion: Secondary | ICD-10-CM | POA: Diagnosis not present

## 2022-10-27 DIAGNOSIS — F17210 Nicotine dependence, cigarettes, uncomplicated: Secondary | ICD-10-CM | POA: Diagnosis not present

## 2022-10-27 DIAGNOSIS — Z7982 Long term (current) use of aspirin: Secondary | ICD-10-CM | POA: Diagnosis not present

## 2022-10-30 DIAGNOSIS — I10 Essential (primary) hypertension: Secondary | ICD-10-CM | POA: Diagnosis not present

## 2022-10-30 DIAGNOSIS — E785 Hyperlipidemia, unspecified: Secondary | ICD-10-CM | POA: Diagnosis not present

## 2022-10-30 DIAGNOSIS — Z79891 Long term (current) use of opiate analgesic: Secondary | ICD-10-CM | POA: Diagnosis not present

## 2022-10-30 DIAGNOSIS — Z7982 Long term (current) use of aspirin: Secondary | ICD-10-CM | POA: Diagnosis not present

## 2022-10-30 DIAGNOSIS — F17210 Nicotine dependence, cigarettes, uncomplicated: Secondary | ICD-10-CM | POA: Diagnosis not present

## 2022-10-30 DIAGNOSIS — S72142K Displaced intertrochanteric fracture of left femur, subsequent encounter for closed fracture with nonunion: Secondary | ICD-10-CM | POA: Diagnosis not present

## 2022-11-06 DIAGNOSIS — S72142K Displaced intertrochanteric fracture of left femur, subsequent encounter for closed fracture with nonunion: Secondary | ICD-10-CM | POA: Diagnosis not present

## 2022-11-06 DIAGNOSIS — Z79891 Long term (current) use of opiate analgesic: Secondary | ICD-10-CM | POA: Diagnosis not present

## 2022-11-06 DIAGNOSIS — E785 Hyperlipidemia, unspecified: Secondary | ICD-10-CM | POA: Diagnosis not present

## 2022-11-06 DIAGNOSIS — F17210 Nicotine dependence, cigarettes, uncomplicated: Secondary | ICD-10-CM | POA: Diagnosis not present

## 2022-11-06 DIAGNOSIS — I10 Essential (primary) hypertension: Secondary | ICD-10-CM | POA: Diagnosis not present

## 2022-11-06 DIAGNOSIS — Z7982 Long term (current) use of aspirin: Secondary | ICD-10-CM | POA: Diagnosis not present

## 2022-11-13 DIAGNOSIS — I10 Essential (primary) hypertension: Secondary | ICD-10-CM | POA: Diagnosis not present

## 2022-11-13 DIAGNOSIS — F17210 Nicotine dependence, cigarettes, uncomplicated: Secondary | ICD-10-CM | POA: Diagnosis not present

## 2022-11-13 DIAGNOSIS — E785 Hyperlipidemia, unspecified: Secondary | ICD-10-CM | POA: Diagnosis not present

## 2022-11-13 DIAGNOSIS — S72142K Displaced intertrochanteric fracture of left femur, subsequent encounter for closed fracture with nonunion: Secondary | ICD-10-CM | POA: Diagnosis not present

## 2022-11-13 DIAGNOSIS — Z79891 Long term (current) use of opiate analgesic: Secondary | ICD-10-CM | POA: Diagnosis not present

## 2022-11-13 DIAGNOSIS — Z7982 Long term (current) use of aspirin: Secondary | ICD-10-CM | POA: Diagnosis not present

## 2022-11-21 DIAGNOSIS — Z7982 Long term (current) use of aspirin: Secondary | ICD-10-CM | POA: Diagnosis not present

## 2022-11-21 DIAGNOSIS — I10 Essential (primary) hypertension: Secondary | ICD-10-CM | POA: Diagnosis not present

## 2022-11-21 DIAGNOSIS — S72142K Displaced intertrochanteric fracture of left femur, subsequent encounter for closed fracture with nonunion: Secondary | ICD-10-CM | POA: Diagnosis not present

## 2022-11-21 DIAGNOSIS — F17210 Nicotine dependence, cigarettes, uncomplicated: Secondary | ICD-10-CM | POA: Diagnosis not present

## 2022-11-21 DIAGNOSIS — E785 Hyperlipidemia, unspecified: Secondary | ICD-10-CM | POA: Diagnosis not present

## 2022-11-21 DIAGNOSIS — Z79891 Long term (current) use of opiate analgesic: Secondary | ICD-10-CM | POA: Diagnosis not present

## 2022-11-26 DIAGNOSIS — Z79891 Long term (current) use of opiate analgesic: Secondary | ICD-10-CM | POA: Diagnosis not present

## 2022-11-26 DIAGNOSIS — Z7982 Long term (current) use of aspirin: Secondary | ICD-10-CM | POA: Diagnosis not present

## 2022-11-26 DIAGNOSIS — Z5221 Bone donor, autologous: Secondary | ICD-10-CM | POA: Diagnosis not present

## 2022-11-26 DIAGNOSIS — Z9181 History of falling: Secondary | ICD-10-CM | POA: Diagnosis not present

## 2022-11-26 DIAGNOSIS — I10 Essential (primary) hypertension: Secondary | ICD-10-CM | POA: Diagnosis not present

## 2022-11-26 DIAGNOSIS — F17210 Nicotine dependence, cigarettes, uncomplicated: Secondary | ICD-10-CM | POA: Diagnosis not present

## 2022-11-26 DIAGNOSIS — S72142K Displaced intertrochanteric fracture of left femur, subsequent encounter for closed fracture with nonunion: Secondary | ICD-10-CM | POA: Diagnosis not present

## 2022-11-26 DIAGNOSIS — E785 Hyperlipidemia, unspecified: Secondary | ICD-10-CM | POA: Diagnosis not present

## 2022-11-30 DIAGNOSIS — Z79891 Long term (current) use of opiate analgesic: Secondary | ICD-10-CM | POA: Diagnosis not present

## 2022-11-30 DIAGNOSIS — S72142K Displaced intertrochanteric fracture of left femur, subsequent encounter for closed fracture with nonunion: Secondary | ICD-10-CM | POA: Diagnosis not present

## 2022-11-30 DIAGNOSIS — E785 Hyperlipidemia, unspecified: Secondary | ICD-10-CM | POA: Diagnosis not present

## 2022-11-30 DIAGNOSIS — Z7982 Long term (current) use of aspirin: Secondary | ICD-10-CM | POA: Diagnosis not present

## 2022-11-30 DIAGNOSIS — I10 Essential (primary) hypertension: Secondary | ICD-10-CM | POA: Diagnosis not present

## 2022-11-30 DIAGNOSIS — F17210 Nicotine dependence, cigarettes, uncomplicated: Secondary | ICD-10-CM | POA: Diagnosis not present

## 2022-12-08 DIAGNOSIS — Z79891 Long term (current) use of opiate analgesic: Secondary | ICD-10-CM | POA: Diagnosis not present

## 2022-12-08 DIAGNOSIS — Z7982 Long term (current) use of aspirin: Secondary | ICD-10-CM | POA: Diagnosis not present

## 2022-12-08 DIAGNOSIS — I10 Essential (primary) hypertension: Secondary | ICD-10-CM | POA: Diagnosis not present

## 2022-12-08 DIAGNOSIS — F17210 Nicotine dependence, cigarettes, uncomplicated: Secondary | ICD-10-CM | POA: Diagnosis not present

## 2022-12-08 DIAGNOSIS — S72142K Displaced intertrochanteric fracture of left femur, subsequent encounter for closed fracture with nonunion: Secondary | ICD-10-CM | POA: Diagnosis not present

## 2022-12-08 DIAGNOSIS — E785 Hyperlipidemia, unspecified: Secondary | ICD-10-CM | POA: Diagnosis not present

## 2022-12-11 DIAGNOSIS — S72142K Displaced intertrochanteric fracture of left femur, subsequent encounter for closed fracture with nonunion: Secondary | ICD-10-CM | POA: Diagnosis not present

## 2022-12-11 DIAGNOSIS — F17210 Nicotine dependence, cigarettes, uncomplicated: Secondary | ICD-10-CM | POA: Diagnosis not present

## 2022-12-11 DIAGNOSIS — E785 Hyperlipidemia, unspecified: Secondary | ICD-10-CM | POA: Diagnosis not present

## 2022-12-11 DIAGNOSIS — I10 Essential (primary) hypertension: Secondary | ICD-10-CM | POA: Diagnosis not present

## 2022-12-11 DIAGNOSIS — Z7982 Long term (current) use of aspirin: Secondary | ICD-10-CM | POA: Diagnosis not present

## 2022-12-11 DIAGNOSIS — Z79891 Long term (current) use of opiate analgesic: Secondary | ICD-10-CM | POA: Diagnosis not present

## 2022-12-13 DIAGNOSIS — S7222XK Displaced subtrochanteric fracture of left femur, subsequent encounter for closed fracture with nonunion: Secondary | ICD-10-CM | POA: Diagnosis not present

## 2022-12-15 ENCOUNTER — Other Ambulatory Visit: Payer: Self-pay | Admitting: Orthopedic Surgery

## 2022-12-15 DIAGNOSIS — S7222XK Displaced subtrochanteric fracture of left femur, subsequent encounter for closed fracture with nonunion: Secondary | ICD-10-CM

## 2022-12-21 ENCOUNTER — Other Ambulatory Visit: Payer: Medicare Other

## 2022-12-21 DIAGNOSIS — Z79891 Long term (current) use of opiate analgesic: Secondary | ICD-10-CM | POA: Diagnosis not present

## 2022-12-21 DIAGNOSIS — Z7982 Long term (current) use of aspirin: Secondary | ICD-10-CM | POA: Diagnosis not present

## 2022-12-21 DIAGNOSIS — S72142K Displaced intertrochanteric fracture of left femur, subsequent encounter for closed fracture with nonunion: Secondary | ICD-10-CM | POA: Diagnosis not present

## 2022-12-21 DIAGNOSIS — I10 Essential (primary) hypertension: Secondary | ICD-10-CM | POA: Diagnosis not present

## 2022-12-21 DIAGNOSIS — F17210 Nicotine dependence, cigarettes, uncomplicated: Secondary | ICD-10-CM | POA: Diagnosis not present

## 2022-12-21 DIAGNOSIS — E785 Hyperlipidemia, unspecified: Secondary | ICD-10-CM | POA: Diagnosis not present

## 2022-12-26 DIAGNOSIS — Z79891 Long term (current) use of opiate analgesic: Secondary | ICD-10-CM | POA: Diagnosis not present

## 2022-12-26 DIAGNOSIS — Z7982 Long term (current) use of aspirin: Secondary | ICD-10-CM | POA: Diagnosis not present

## 2022-12-26 DIAGNOSIS — F17210 Nicotine dependence, cigarettes, uncomplicated: Secondary | ICD-10-CM | POA: Diagnosis not present

## 2022-12-26 DIAGNOSIS — E785 Hyperlipidemia, unspecified: Secondary | ICD-10-CM | POA: Diagnosis not present

## 2022-12-26 DIAGNOSIS — Z9181 History of falling: Secondary | ICD-10-CM | POA: Diagnosis not present

## 2022-12-26 DIAGNOSIS — S72142K Displaced intertrochanteric fracture of left femur, subsequent encounter for closed fracture with nonunion: Secondary | ICD-10-CM | POA: Diagnosis not present

## 2022-12-26 DIAGNOSIS — Z5221 Bone donor, autologous: Secondary | ICD-10-CM | POA: Diagnosis not present

## 2022-12-26 DIAGNOSIS — I10 Essential (primary) hypertension: Secondary | ICD-10-CM | POA: Diagnosis not present

## 2022-12-27 ENCOUNTER — Other Ambulatory Visit: Payer: Medicare Other

## 2022-12-28 DIAGNOSIS — I10 Essential (primary) hypertension: Secondary | ICD-10-CM | POA: Diagnosis not present

## 2022-12-28 DIAGNOSIS — Z7982 Long term (current) use of aspirin: Secondary | ICD-10-CM | POA: Diagnosis not present

## 2022-12-28 DIAGNOSIS — S72142K Displaced intertrochanteric fracture of left femur, subsequent encounter for closed fracture with nonunion: Secondary | ICD-10-CM | POA: Diagnosis not present

## 2022-12-28 DIAGNOSIS — F17210 Nicotine dependence, cigarettes, uncomplicated: Secondary | ICD-10-CM | POA: Diagnosis not present

## 2022-12-28 DIAGNOSIS — E785 Hyperlipidemia, unspecified: Secondary | ICD-10-CM | POA: Diagnosis not present

## 2022-12-28 DIAGNOSIS — Z79891 Long term (current) use of opiate analgesic: Secondary | ICD-10-CM | POA: Diagnosis not present

## 2023-01-04 DIAGNOSIS — F17210 Nicotine dependence, cigarettes, uncomplicated: Secondary | ICD-10-CM | POA: Diagnosis not present

## 2023-01-04 DIAGNOSIS — Z79891 Long term (current) use of opiate analgesic: Secondary | ICD-10-CM | POA: Diagnosis not present

## 2023-01-04 DIAGNOSIS — E785 Hyperlipidemia, unspecified: Secondary | ICD-10-CM | POA: Diagnosis not present

## 2023-01-04 DIAGNOSIS — Z7982 Long term (current) use of aspirin: Secondary | ICD-10-CM | POA: Diagnosis not present

## 2023-01-04 DIAGNOSIS — I10 Essential (primary) hypertension: Secondary | ICD-10-CM | POA: Diagnosis not present

## 2023-01-04 DIAGNOSIS — S72142K Displaced intertrochanteric fracture of left femur, subsequent encounter for closed fracture with nonunion: Secondary | ICD-10-CM | POA: Diagnosis not present

## 2023-01-11 DIAGNOSIS — E785 Hyperlipidemia, unspecified: Secondary | ICD-10-CM | POA: Diagnosis not present

## 2023-01-11 DIAGNOSIS — Z79891 Long term (current) use of opiate analgesic: Secondary | ICD-10-CM | POA: Diagnosis not present

## 2023-01-11 DIAGNOSIS — Z7982 Long term (current) use of aspirin: Secondary | ICD-10-CM | POA: Diagnosis not present

## 2023-01-11 DIAGNOSIS — F17210 Nicotine dependence, cigarettes, uncomplicated: Secondary | ICD-10-CM | POA: Diagnosis not present

## 2023-01-11 DIAGNOSIS — S72142K Displaced intertrochanteric fracture of left femur, subsequent encounter for closed fracture with nonunion: Secondary | ICD-10-CM | POA: Diagnosis not present

## 2023-01-11 DIAGNOSIS — I10 Essential (primary) hypertension: Secondary | ICD-10-CM | POA: Diagnosis not present

## 2023-01-15 ENCOUNTER — Ambulatory Visit
Admission: RE | Admit: 2023-01-15 | Discharge: 2023-01-15 | Disposition: A | Discharge status: Home / Self Care | Payer: Medicare Other | Source: Ambulatory Visit | Attending: Orthopedic Surgery | Admitting: Orthopedic Surgery

## 2023-01-15 DIAGNOSIS — F17210 Nicotine dependence, cigarettes, uncomplicated: Secondary | ICD-10-CM | POA: Diagnosis not present

## 2023-01-15 DIAGNOSIS — S7222XK Displaced subtrochanteric fracture of left femur, subsequent encounter for closed fracture with nonunion: Secondary | ICD-10-CM

## 2023-01-15 DIAGNOSIS — I10 Essential (primary) hypertension: Secondary | ICD-10-CM | POA: Diagnosis not present

## 2023-01-15 DIAGNOSIS — Z79891 Long term (current) use of opiate analgesic: Secondary | ICD-10-CM | POA: Diagnosis not present

## 2023-01-15 DIAGNOSIS — E785 Hyperlipidemia, unspecified: Secondary | ICD-10-CM | POA: Diagnosis not present

## 2023-01-15 DIAGNOSIS — Z7982 Long term (current) use of aspirin: Secondary | ICD-10-CM | POA: Diagnosis not present

## 2023-01-15 DIAGNOSIS — S72142K Displaced intertrochanteric fracture of left femur, subsequent encounter for closed fracture with nonunion: Secondary | ICD-10-CM | POA: Diagnosis not present

## 2023-01-17 ENCOUNTER — Other Ambulatory Visit: Payer: Medicare Other

## 2023-01-22 DIAGNOSIS — Z7982 Long term (current) use of aspirin: Secondary | ICD-10-CM | POA: Diagnosis not present

## 2023-01-22 DIAGNOSIS — E785 Hyperlipidemia, unspecified: Secondary | ICD-10-CM | POA: Diagnosis not present

## 2023-01-22 DIAGNOSIS — F17210 Nicotine dependence, cigarettes, uncomplicated: Secondary | ICD-10-CM | POA: Diagnosis not present

## 2023-01-22 DIAGNOSIS — I10 Essential (primary) hypertension: Secondary | ICD-10-CM | POA: Diagnosis not present

## 2023-01-22 DIAGNOSIS — S72142K Displaced intertrochanteric fracture of left femur, subsequent encounter for closed fracture with nonunion: Secondary | ICD-10-CM | POA: Diagnosis not present

## 2023-01-22 DIAGNOSIS — Z79891 Long term (current) use of opiate analgesic: Secondary | ICD-10-CM | POA: Diagnosis not present

## 2023-01-25 DIAGNOSIS — Z79891 Long term (current) use of opiate analgesic: Secondary | ICD-10-CM | POA: Diagnosis not present

## 2023-01-25 DIAGNOSIS — S72142K Displaced intertrochanteric fracture of left femur, subsequent encounter for closed fracture with nonunion: Secondary | ICD-10-CM | POA: Diagnosis not present

## 2023-01-25 DIAGNOSIS — Z7982 Long term (current) use of aspirin: Secondary | ICD-10-CM | POA: Diagnosis not present

## 2023-01-25 DIAGNOSIS — Z5221 Bone donor, autologous: Secondary | ICD-10-CM | POA: Diagnosis not present

## 2023-01-25 DIAGNOSIS — I10 Essential (primary) hypertension: Secondary | ICD-10-CM | POA: Diagnosis not present

## 2023-01-25 DIAGNOSIS — Z9181 History of falling: Secondary | ICD-10-CM | POA: Diagnosis not present

## 2023-01-25 DIAGNOSIS — E785 Hyperlipidemia, unspecified: Secondary | ICD-10-CM | POA: Diagnosis not present

## 2023-01-25 DIAGNOSIS — F17210 Nicotine dependence, cigarettes, uncomplicated: Secondary | ICD-10-CM | POA: Diagnosis not present

## 2023-02-01 DIAGNOSIS — S72142K Displaced intertrochanteric fracture of left femur, subsequent encounter for closed fracture with nonunion: Secondary | ICD-10-CM | POA: Diagnosis not present

## 2023-02-01 DIAGNOSIS — Z79891 Long term (current) use of opiate analgesic: Secondary | ICD-10-CM | POA: Diagnosis not present

## 2023-02-01 DIAGNOSIS — I10 Essential (primary) hypertension: Secondary | ICD-10-CM | POA: Diagnosis not present

## 2023-02-01 DIAGNOSIS — E785 Hyperlipidemia, unspecified: Secondary | ICD-10-CM | POA: Diagnosis not present

## 2023-02-01 DIAGNOSIS — F17210 Nicotine dependence, cigarettes, uncomplicated: Secondary | ICD-10-CM | POA: Diagnosis not present

## 2023-02-01 DIAGNOSIS — Z7982 Long term (current) use of aspirin: Secondary | ICD-10-CM | POA: Diagnosis not present

## 2023-02-07 DIAGNOSIS — S7222XK Displaced subtrochanteric fracture of left femur, subsequent encounter for closed fracture with nonunion: Secondary | ICD-10-CM | POA: Diagnosis not present

## 2023-02-07 DIAGNOSIS — M1612 Unilateral primary osteoarthritis, left hip: Secondary | ICD-10-CM | POA: Diagnosis not present

## 2023-02-08 DIAGNOSIS — E785 Hyperlipidemia, unspecified: Secondary | ICD-10-CM | POA: Diagnosis not present

## 2023-02-08 DIAGNOSIS — S72142K Displaced intertrochanteric fracture of left femur, subsequent encounter for closed fracture with nonunion: Secondary | ICD-10-CM | POA: Diagnosis not present

## 2023-02-08 DIAGNOSIS — Z7982 Long term (current) use of aspirin: Secondary | ICD-10-CM | POA: Diagnosis not present

## 2023-02-08 DIAGNOSIS — Z79891 Long term (current) use of opiate analgesic: Secondary | ICD-10-CM | POA: Diagnosis not present

## 2023-02-08 DIAGNOSIS — F17210 Nicotine dependence, cigarettes, uncomplicated: Secondary | ICD-10-CM | POA: Diagnosis not present

## 2023-02-08 DIAGNOSIS — I10 Essential (primary) hypertension: Secondary | ICD-10-CM | POA: Diagnosis not present

## 2023-02-15 DIAGNOSIS — I10 Essential (primary) hypertension: Secondary | ICD-10-CM | POA: Diagnosis not present

## 2023-02-15 DIAGNOSIS — Z7982 Long term (current) use of aspirin: Secondary | ICD-10-CM | POA: Diagnosis not present

## 2023-02-15 DIAGNOSIS — S72142K Displaced intertrochanteric fracture of left femur, subsequent encounter for closed fracture with nonunion: Secondary | ICD-10-CM | POA: Diagnosis not present

## 2023-02-15 DIAGNOSIS — Z79891 Long term (current) use of opiate analgesic: Secondary | ICD-10-CM | POA: Diagnosis not present

## 2023-02-15 DIAGNOSIS — F17210 Nicotine dependence, cigarettes, uncomplicated: Secondary | ICD-10-CM | POA: Diagnosis not present

## 2023-02-15 DIAGNOSIS — E785 Hyperlipidemia, unspecified: Secondary | ICD-10-CM | POA: Diagnosis not present

## 2023-02-21 DIAGNOSIS — I10 Essential (primary) hypertension: Secondary | ICD-10-CM | POA: Diagnosis not present

## 2023-02-21 DIAGNOSIS — F17210 Nicotine dependence, cigarettes, uncomplicated: Secondary | ICD-10-CM | POA: Diagnosis not present

## 2023-02-21 DIAGNOSIS — E785 Hyperlipidemia, unspecified: Secondary | ICD-10-CM | POA: Diagnosis not present

## 2023-02-21 DIAGNOSIS — Z79891 Long term (current) use of opiate analgesic: Secondary | ICD-10-CM | POA: Diagnosis not present

## 2023-02-21 DIAGNOSIS — S72142K Displaced intertrochanteric fracture of left femur, subsequent encounter for closed fracture with nonunion: Secondary | ICD-10-CM | POA: Diagnosis not present

## 2023-02-21 DIAGNOSIS — Z7982 Long term (current) use of aspirin: Secondary | ICD-10-CM | POA: Diagnosis not present

## 2023-02-24 DIAGNOSIS — F17210 Nicotine dependence, cigarettes, uncomplicated: Secondary | ICD-10-CM | POA: Diagnosis not present

## 2023-02-24 DIAGNOSIS — S72142K Displaced intertrochanteric fracture of left femur, subsequent encounter for closed fracture with nonunion: Secondary | ICD-10-CM | POA: Diagnosis not present

## 2023-02-24 DIAGNOSIS — Z9181 History of falling: Secondary | ICD-10-CM | POA: Diagnosis not present

## 2023-02-24 DIAGNOSIS — I10 Essential (primary) hypertension: Secondary | ICD-10-CM | POA: Diagnosis not present

## 2023-02-24 DIAGNOSIS — Z7982 Long term (current) use of aspirin: Secondary | ICD-10-CM | POA: Diagnosis not present

## 2023-02-24 DIAGNOSIS — Z79891 Long term (current) use of opiate analgesic: Secondary | ICD-10-CM | POA: Diagnosis not present

## 2023-02-24 DIAGNOSIS — E785 Hyperlipidemia, unspecified: Secondary | ICD-10-CM | POA: Diagnosis not present

## 2023-02-24 DIAGNOSIS — Z5221 Bone donor, autologous: Secondary | ICD-10-CM | POA: Diagnosis not present

## 2023-02-28 DIAGNOSIS — Z79891 Long term (current) use of opiate analgesic: Secondary | ICD-10-CM | POA: Diagnosis not present

## 2023-02-28 DIAGNOSIS — F17210 Nicotine dependence, cigarettes, uncomplicated: Secondary | ICD-10-CM | POA: Diagnosis not present

## 2023-02-28 DIAGNOSIS — S72142K Displaced intertrochanteric fracture of left femur, subsequent encounter for closed fracture with nonunion: Secondary | ICD-10-CM | POA: Diagnosis not present

## 2023-02-28 DIAGNOSIS — Z7982 Long term (current) use of aspirin: Secondary | ICD-10-CM | POA: Diagnosis not present

## 2023-02-28 DIAGNOSIS — I10 Essential (primary) hypertension: Secondary | ICD-10-CM | POA: Diagnosis not present

## 2023-02-28 DIAGNOSIS — E785 Hyperlipidemia, unspecified: Secondary | ICD-10-CM | POA: Diagnosis not present

## 2023-03-06 DIAGNOSIS — S72142K Displaced intertrochanteric fracture of left femur, subsequent encounter for closed fracture with nonunion: Secondary | ICD-10-CM | POA: Diagnosis not present

## 2023-03-06 DIAGNOSIS — E785 Hyperlipidemia, unspecified: Secondary | ICD-10-CM | POA: Diagnosis not present

## 2023-03-06 DIAGNOSIS — Z79891 Long term (current) use of opiate analgesic: Secondary | ICD-10-CM | POA: Diagnosis not present

## 2023-03-06 DIAGNOSIS — F17210 Nicotine dependence, cigarettes, uncomplicated: Secondary | ICD-10-CM | POA: Diagnosis not present

## 2023-03-06 DIAGNOSIS — I10 Essential (primary) hypertension: Secondary | ICD-10-CM | POA: Diagnosis not present

## 2023-03-06 DIAGNOSIS — Z7982 Long term (current) use of aspirin: Secondary | ICD-10-CM | POA: Diagnosis not present

## 2023-03-14 DIAGNOSIS — F17210 Nicotine dependence, cigarettes, uncomplicated: Secondary | ICD-10-CM | POA: Diagnosis not present

## 2023-03-14 DIAGNOSIS — E785 Hyperlipidemia, unspecified: Secondary | ICD-10-CM | POA: Diagnosis not present

## 2023-03-14 DIAGNOSIS — I10 Essential (primary) hypertension: Secondary | ICD-10-CM | POA: Diagnosis not present

## 2023-03-14 DIAGNOSIS — Z79891 Long term (current) use of opiate analgesic: Secondary | ICD-10-CM | POA: Diagnosis not present

## 2023-03-14 DIAGNOSIS — Z7982 Long term (current) use of aspirin: Secondary | ICD-10-CM | POA: Diagnosis not present

## 2023-03-14 DIAGNOSIS — S72142K Displaced intertrochanteric fracture of left femur, subsequent encounter for closed fracture with nonunion: Secondary | ICD-10-CM | POA: Diagnosis not present

## 2023-03-19 DIAGNOSIS — Z79891 Long term (current) use of opiate analgesic: Secondary | ICD-10-CM | POA: Diagnosis not present

## 2023-03-19 DIAGNOSIS — S72142K Displaced intertrochanteric fracture of left femur, subsequent encounter for closed fracture with nonunion: Secondary | ICD-10-CM | POA: Diagnosis not present

## 2023-03-19 DIAGNOSIS — F17210 Nicotine dependence, cigarettes, uncomplicated: Secondary | ICD-10-CM | POA: Diagnosis not present

## 2023-03-19 DIAGNOSIS — Z7982 Long term (current) use of aspirin: Secondary | ICD-10-CM | POA: Diagnosis not present

## 2023-03-19 DIAGNOSIS — I10 Essential (primary) hypertension: Secondary | ICD-10-CM | POA: Diagnosis not present

## 2023-03-19 DIAGNOSIS — E785 Hyperlipidemia, unspecified: Secondary | ICD-10-CM | POA: Diagnosis not present

## 2023-03-27 DIAGNOSIS — M25552 Pain in left hip: Secondary | ICD-10-CM | POA: Diagnosis not present

## 2023-04-10 DIAGNOSIS — M25552 Pain in left hip: Secondary | ICD-10-CM | POA: Diagnosis not present

## 2023-04-23 DIAGNOSIS — E871 Hypo-osmolality and hyponatremia: Secondary | ICD-10-CM | POA: Diagnosis not present

## 2023-04-23 DIAGNOSIS — Z139 Encounter for screening, unspecified: Secondary | ICD-10-CM | POA: Diagnosis not present

## 2023-04-23 DIAGNOSIS — E782 Mixed hyperlipidemia: Secondary | ICD-10-CM | POA: Diagnosis not present

## 2023-04-23 DIAGNOSIS — Z9181 History of falling: Secondary | ICD-10-CM | POA: Diagnosis not present

## 2023-04-23 DIAGNOSIS — Z01818 Encounter for other preprocedural examination: Secondary | ICD-10-CM | POA: Diagnosis not present

## 2023-04-23 DIAGNOSIS — I10 Essential (primary) hypertension: Secondary | ICD-10-CM | POA: Diagnosis not present

## 2023-04-23 DIAGNOSIS — Z125 Encounter for screening for malignant neoplasm of prostate: Secondary | ICD-10-CM | POA: Diagnosis not present

## 2023-04-23 DIAGNOSIS — M1612 Unilateral primary osteoarthritis, left hip: Secondary | ICD-10-CM | POA: Diagnosis not present

## 2023-04-23 DIAGNOSIS — Z23 Encounter for immunization: Secondary | ICD-10-CM | POA: Diagnosis not present

## 2023-04-23 DIAGNOSIS — M545 Low back pain, unspecified: Secondary | ICD-10-CM | POA: Diagnosis not present

## 2023-04-23 DIAGNOSIS — Z1331 Encounter for screening for depression: Secondary | ICD-10-CM | POA: Diagnosis not present

## 2023-04-26 NOTE — Progress Notes (Signed)
Surgery orders requested via Epic inbox. °

## 2023-05-03 ENCOUNTER — Encounter (HOSPITAL_COMMUNITY)
Admission: RE | Admit: 2023-05-03 | Discharge: 2023-05-03 | Disposition: A | Discharge status: Home / Self Care | Payer: Medicare Other | Source: Ambulatory Visit | Attending: Family Medicine | Admitting: Family Medicine

## 2023-05-16 ENCOUNTER — Inpatient Hospital Stay (HOSPITAL_COMMUNITY): Admit: 2023-05-16 | Payer: Medicare Other | Admitting: Orthopedic Surgery

## 2023-05-16 SURGERY — CONVERSION TO TOTAL HIP
Anesthesia: Spinal | Site: Hip | Laterality: Left

## 2023-05-25 ENCOUNTER — Other Ambulatory Visit (HOSPITAL_COMMUNITY): Payer: Self-pay

## 2023-05-31 DIAGNOSIS — M25552 Pain in left hip: Secondary | ICD-10-CM | POA: Diagnosis not present

## 2023-06-04 DIAGNOSIS — R972 Elevated prostate specific antigen [PSA]: Secondary | ICD-10-CM | POA: Diagnosis not present

## 2023-07-05 ENCOUNTER — Ambulatory Visit: Payer: Self-pay | Admitting: Emergency Medicine

## 2023-07-05 DIAGNOSIS — M1652 Unilateral post-traumatic osteoarthritis, left hip: Secondary | ICD-10-CM

## 2023-07-05 DIAGNOSIS — M25552 Pain in left hip: Secondary | ICD-10-CM | POA: Diagnosis not present

## 2023-07-05 DIAGNOSIS — G8929 Other chronic pain: Secondary | ICD-10-CM

## 2023-07-05 NOTE — H&P (Signed)
CONVERSION TO TOTAL HIP ARTHROPLASTY ADMISSION H&P  Patient is admitted for conversion to left total hip arthroplasty.  Subjective:  Chief Complaint: left hip pain  HPI: Jonathon Anderson, 67 y.o. male, has a history of pain and functional disability in the left hip due to  post-traumatic osteoarthritis  and patient has failed non-surgical conservative treatments for greater than 12 weeks to include NSAID's and/or analgesics, supervised PT with diminished ADL's post treatment, use of assistive devices, and activity modification. The indications for the revision total hip arthroplasty are  post-traumatic osteoarthritis .  Onset of symptoms was gradual starting 2 years ago with gradually worsening course since that time.  Prior procedures on the left hip include  ORIF left femur .  Patient currently rates pain in the left hip at 10 out of 10 with activity.  There is night pain, worsening of pain with activity and weight bearing, trendelenberg gait, pain that interfers with activities of daily living, and pain with passive range of motion. Patient has evidence of  unchanged alignment of intertrochanteric fracture with severe erosion of femoral head  by imaging studies.  This condition presents safety issues increasing the risk of falls.  This patient has had  previous ORIF of left femur .  There is no current active infection.  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   Closed disp comminuted fracture of shaft of left femur with nonunion 06/06/2022   Closed subtrochanteric fracture of hip, left, initial encounter (HCC) 05/10/2021   Hypertension 05/10/2021   Mixed hyperlipidemia 05/10/2021   Nicotine dependence 05/10/2021   Hyponatremia 05/10/2021   Past Medical History:  Diagnosis Date   Hyperlipidemia    Hypertension    Nicotine dependence     Past Surgical  History:  Procedure Laterality Date   FEMUR IM NAIL Left 07/27/2022   Procedure: REPAIR FEMUR WITH AUTOGRAFT/ RIA;  Surgeon: Myrene Galas, MD;  Location: [facility] OR;  Service: Orthopedics;  Laterality: Left;   HARDWARE REMOVAL Left 06/06/2022   Procedure: HARDWARE REMOVAL;  Surgeon: Myrene Galas, MD;  Location: [facility] OR;  Service: Orthopedics;  Laterality: Left;   HARDWARE REMOVAL Bilateral 07/27/2022   Procedure: REPAIR OF LEFT PROXIMAL FEMUR NONUNION, REMOVAL OF ANTIBIOTIC SPACER, REAMED INTERMEDULLARY ASPRIATE RIGHT FEMUR;  Surgeon: Myrene Galas, MD;  Location: [facility] OR;  Service: Orthopedics;  Laterality: Bilateral;   INTRAMEDULLARY (IM) NAIL INTERTROCHANTERIC Left 05/11/2021   Procedure: INTRAMEDULLARY (IM) NAIL INTERTROCHANTRIC OF LEFT SUBTHROCHANTRIC HIP FRACTURE;  Surgeon: Christena Flake, MD;  Location: [facility] ORS;  Service: Orthopedics;  Laterality: Left;   ORIF FEMUR FRACTURE Left 06/06/2022   Procedure: OPEN REDUCTION INTERNAL FIXATION FEMORAL PROXIMAL FRACTURE;  Surgeon: Myrene Galas, MD;  Location: [facility] OR;  Service: Orthopedics;  Laterality: Left;    Current Outpatient Medications  Medication Sig Dispense Refill Last Dose/Taking   acetaminophen (TYLENOL) 500 MG tablet Take 1 tablet (500 mg total) by mouth every 12 (twelve) hours as needed for mild pain. 60 tablet 0    aspirin EC 81 MG tablet Take 81 mg by mouth at bedtime. Swallow whole.      atorvastatin (LIPITOR) 20 MG tablet Take 20 mg by mouth at bedtime.      Calcium Carbonate-Vitamin D (CALTRATE 600+D PO) Take 1 tablet by mouth in the morning and at bedtime.      cyclobenzaprine (FLEXERIL) 10 MG tablet Take  1 tablet (10 mg total) by mouth every 12 (twelve) hours as needed for muscle spasms. 40 tablet 0    diclofenac (VOLTAREN) 75 MG EC tablet Take 75 mg by mouth 2 (two) times daily.      docusate sodium (COLACE) 100 MG capsule Take 1 capsule (100 mg total) by mouth 2 (two) times daily. (Patient not taking: Reported on 04/25/2023) 20  capsule 0    Menthol, Topical Analgesic, (BIOFREEZE EX) Apply 1 Application topically daily as needed (pain).      metoprolol tartrate (LOPRESSOR) 100 MG tablet Take 100 mg by mouth 2 (two) times daily.      Omega-3 Fatty Acids (FISH OIL) 1200 MG CAPS Take 1,200 mg by mouth 2 (two) times daily.      oxyCODONE-acetaminophen (PERCOCET) 5-325 MG tablet Take 1-2 tablets by mouth every 6 (six) hours as needed for severe pain or moderate pain. (Patient not taking: Reported on 04/25/2023) 50 tablet 0    Potassium 99 MG TABS Take 99 mg by mouth daily.      thiamine 100 MG tablet Take 1 tablet (100 mg total) by mouth daily. (Patient not taking: Reported on 04/25/2023)      No current facility-administered medications for this visit.   No Known Allergies  Social History   Tobacco Use   Smoking status: Former    Current packs/day: 0.00    Types: Cigarettes    Quit date: 06/01/2022    Years since quitting: 1.0   Smokeless tobacco: Never  Substance Use Topics   Alcohol use: Not Currently    Comment: Previously heavy alcohol use 4-5 cans of beer/day but no alchol since 10/2020.    Family History  Problem Relation Age of Onset   Heart attack Mother    Hypertension Brother    Heart failure Paternal Grandfather       Review of Systems  Musculoskeletal:  Positive for arthralgias.  All other systems reviewed and are negative.   Objective:  Physical Exam Constitutional:      General: He is not in acute distress.    Appearance: Normal appearance. He is normal weight.  HENT:     Head: Normocephalic and atraumatic.  Eyes:     Extraocular Movements: Extraocular movements intact.     Conjunctiva/sclera: Conjunctivae normal.     Pupils: Pupils are equal, round, and reactive to light.  Cardiovascular:     Rate and Rhythm: Normal rate and regular rhythm.     Pulses: Normal pulses.     Heart sounds: Normal heart sounds.  Pulmonary:     Effort: Pulmonary effort is normal. No respiratory distress.      Breath sounds: Normal breath sounds.  Abdominal:     General: Bowel sounds are normal. There is no distension.     Palpations: Abdomen is soft.     Tenderness: There is no abdominal tenderness.  Musculoskeletal:        General: Tenderness present.     Cervical back: Normal range of motion and neck supple.     Comments: TTP over groin, lateral aspect, greater trochanter.  No significant swelling.  No overlying lesions of area of chief complaint.  Decreased strength and ROM due to elicited pain.  Dorsiflexion and plantarflexion intact.  BLE appear grossly neurovascularly intact.  Gait slow and antalgic with use of rollator   Lymphadenopathy:     Cervical: No cervical adenopathy.  Skin:    General: Skin is warm and dry.     Capillary  Refill: Capillary refill takes less than 2 seconds.     Findings: No erythema or rash.  Neurological:     General: No focal deficit present.     Mental Status: He is alert and oriented to person, place, and time.  Psychiatric:        Mood and Affect: Mood normal.        Behavior: Behavior normal.     Vital signs in last 24 hours: @VSRANGES @   Labs:   Estimated body mass index is 31.62 kg/m as calculated from the following:   Height as of 07/27/22: 5' 8.5" (1.74 m).   Weight as of 07/27/22: 95.7 kg.  Imaging Review:  Plain radiographs demonstrate unchanged alignment of intertrochanteric fracture with severe degenerative changes and erosion of femoral head of the left hip(s). The bone quality appears to be fair for age and reported activity level.      Assessment/Plan:  Post-traumatic arthritis, left hip(s) with prior ORIF left femur.  The patient history, physical examination, clinical judgement of the provider and imaging studies are consistent with post-traumatic disease of the left hip(s), previous total hip arthroplasty. Revision total hip arthroplasty is deemed medically necessary. The treatment options including medical management,  injection therapy, arthroscopy and arthroplasty were discussed at length. The risks and benefits of total hip arthroplasty were presented and reviewed. The risks due to aseptic loosening, infection, stiffness, dislocation/subluxation,  thromboembolic complications and other imponderables were discussed.  The patient acknowledged the explanation, agreed to proceed with the plan and consent was signed. Patient is being admitted for inpatient treatment for surgery, pain control, PT, OT, prophylactic antibiotics, VTE prophylaxis, progressive ambulation and ADL's and discharge planning. The patient is planning to be discharged home with home health services ([facility]).

## 2023-07-11 NOTE — Progress Notes (Addendum)
Anesthesia Review:  PCP: Maura Hamrick in [place] and [facility] on 07/20/23 and requested most recent ov notes.  Also informed on message taht pt had elevated blood pressure readings at preop appt.  Cardiologist  none  Chest x-ray : EKG :07/20/23  Echo : Stress test: Cardiac Cath :  Activity level: can do a flight of stairs without difficutly  Sleep Study/ CPAP : none  Fasting Blood Sugar :      / Checks Blood Sugar -- times a day:   Blood Thinner/ Instructions /Last Dose: ASA / Instructions/ Last Dose :    PT in for preop on 07/20/23.  Blood pressure initially in right arm was 179/141.  Pt very talkative.  PT denies any chest pain, shortness of breath, dizziness, headache or blurred vision.,  Pt states " My blood pressure has been elevated.  I check it at home sometimes daily.  Saw PCP approx week ago and another blood pressure med was added .  Pt as not picked up yet.  Plans to pick up on 07/21/23 at [facility].  PT to see PCP again on 07/23/2023 per pt.  PT states " I was taking some Sudafed and my doctor told me to stop taking for my sinuses and ot take Coricidin.  PT staes he stopp Sudafed 3 days ago approx.  PT was informed that if blood pressure is not under better control he will be cancelled DOS.  Informed pt to take blood pressure meds untel DOS and to keep MD appt on 07/23/2023 and to go to ER if he has any chest pain, blurred vision, shortness of breath dizziness or headache or any other symptoms of stroke.  PT voiced understanding.  Also informed sister who was in waiting room about blood pressure being elevated.  Upon recheck of blood pressure , blood pressure was 186/106 in left arm Terance Hart, PAC aware and maware of EKG . PT was given copy of blood pressure readings at preop appt.     CMP done 12/27/with Potassium of 3.1 routed to DR [facility] on 07/20/23. Jacobo Forest aware

## 2023-07-13 DIAGNOSIS — J209 Acute bronchitis, unspecified: Secondary | ICD-10-CM | POA: Diagnosis not present

## 2023-07-13 DIAGNOSIS — I10 Essential (primary) hypertension: Secondary | ICD-10-CM | POA: Diagnosis not present

## 2023-07-13 DIAGNOSIS — M1652 Unilateral post-traumatic osteoarthritis, left hip: Secondary | ICD-10-CM | POA: Diagnosis not present

## 2023-07-16 NOTE — Patient Instructions (Signed)
SURGICAL WAITING ROOM VISITATION  Patients having surgery or a procedure may have no more than 2 support people in the waiting area - these visitors may rotate.    Children under the age of 60 must have an adult with them who is not the patient.  Due to an increase in RSV and influenza rates and associated hospitalizations, children ages 44 and under may not visit patients in [facility] hospitals.  If the patient needs to stay at the hospital during part of their recovery, the visitor guidelines for inpatient rooms apply. Pre-op nurse will coordinate an appropriate time for 1 support person to accompany patient in pre-op.  This support person may not rotate.    Please refer to the [facility] website for the visitor guidelines for Inpatients (after your surgery is over and you are in a regular room).       Your procedure is scheduled on:  07/30/2023    Report to [facility] Main Entrance    Report to admitting at  1230pm    Call this number if you have problems the morning of surgery [phone]   Do not eat food :After Midnight.   After Midnight you may have the following liquids until  1200 noon   DAY OF SURGERY  Water Non-Citrus Juices (without pulp, NO RED-Apple, White grape, White cranberry) Black Coffee (NO MILK/CREAM OR CREAMERS, sugar ok)  Clear Tea (NO MILK/CREAM OR CREAMERS, sugar ok) regular and decaf                             Plain Jell-O (NO RED)                                           Fruit ices (not with fruit pulp, NO RED)                                     Popsicles (NO RED)                                                               Sports drinks like Gatorade (NO RED)                   The day of surgery:  Drink ONE (1) Pre-Surgery Clear Ensure or G2 at  1200 noon  ( have ocmpleted by )  the morning of surgery. Drink in one sitting. Do not sip.  This drink was given to you during your hospital  pre-op appointment visit. Nothing else to  drink after completing the  Pre-Surgery Clear Ensure or G2.          If you have questions, please contact your surgeon's office.       Oral Hygiene is also important to reduce your risk of infection.                                    Remember - BRUSH YOUR TEETH THE MORNING OF  SURGERY WITH YOUR REGULAR TOOTHPASTE  DENTURES WILL BE REMOVED PRIOR TO SURGERY PLEASE DO NOT APPLY "Poly grip" OR ADHESIVES!!!   Do NOT smoke after Midnight   Stop all vitamins and herbal supplements 7 days before surgery.   Take these medicines the morning of surgery with A SIP OF WATER:  metoprolol   DO NOT TAKE ANY ORAL DIABETIC MEDICATIONS DAY OF YOUR SURGERY  Bring CPAP mask and tubing day of surgery.                              You may not have any metal on your body including hair pins, jewelry, and body piercing             Do not wear make-up, lotions, powders, perfumes/cologne, or deodorant  Do not wear nail polish including gel and S&S, artificial/acrylic nails, or any other type of covering on natural nails including finger and toenails. If you have artificial nails, gel coating, etc. that needs to be removed by a nail salon please have this removed prior to surgery or surgery may need to be canceled/ delayed if the surgeon/ anesthesia feels like they are unable to be safely monitored.   Do not shave  48 hours prior to surgery.               Men may shave face and neck.   Do not bring valuables to the hospital. [facility] IS NOT             RESPONSIBLE   FOR VALUABLES.   Contacts, glasses, dentures or bridgework may not be worn into surgery.   Bring small overnight bag day of surgery.   DO NOT BRING YOUR HOME MEDICATIONS TO THE HOSPITAL. PHARMACY WILL DISPENSE MEDICATIONS LISTED ON YOUR MEDICATION LIST TO YOU DURING YOUR ADMISSION IN THE HOSPITAL!    Patients discharged on the day of surgery will not be allowed to drive home.  Someone NEEDS to stay with you for the first 24 hours after  anesthesia.   Special Instructions: Bring a copy of your healthcare power of attorney and living will documents the day of surgery if you haven't scanned them before.              Please read over the following fact sheets you were given: IF YOU HAVE QUESTIONS ABOUT YOUR PRE-OP INSTRUCTIONS PLEASE CALL [phone]   If you received a COVID test during your pre-op visit  it is requested that you wear a mask when out in public, stay away from anyone that may not be feeling well and notify your surgeon if you develop symptoms. If you test positive for Covid or have been in contact with anyone that has tested positive in the last 10 days please notify you surgeon.      Pre-operative 5 CHG Bath Instructions   You can play a key role in reducing the risk of infection after surgery. Your skin needs to be as free of germs as possible. You can reduce the number of germs on your skin by washing with CHG (chlorhexidine gluconate) soap before surgery. CHG is an antiseptic soap that kills germs and continues to kill germs even after washing.   DO NOT use if you have an allergy to chlorhexidine/CHG or antibacterial soaps. If your skin becomes reddened or irritated, stop using the CHG and notify one of our RNs at [phone].   Please shower with the CHG  soap starting 4 days before surgery using the following schedule:     Please keep in mind the following:  DO NOT shave, including legs and underarms, starting the day of your first shower.   You may shave your face at any point before/day of surgery.  Place clean sheets on your bed the day you start using CHG soap. Use a clean washcloth (not used since being washed) for each shower. DO NOT sleep with pets once you start using the CHG.   CHG Shower Instructions:  If you choose to wash your hair and private area, wash first with your normal shampoo/soap.  After you use shampoo/soap, rinse your hair and body thoroughly to remove shampoo/soap residue.   Turn the water OFF and apply about 3 tablespoons (45 ml) of CHG soap to a CLEAN washcloth.  Apply CHG soap ONLY FROM YOUR NECK DOWN TO YOUR TOES (washing for 3-5 minutes)  DO NOT use CHG soap on face, private areas, open wounds, or sores.  Pay special attention to the area where your surgery is being performed.  If you are having back surgery, having someone wash your back for you may be helpful. Wait 2 minutes after CHG soap is applied, then you may rinse off the CHG soap.  Pat dry with a clean towel  Put on clean clothes/pajamas   If you choose to wear lotion, please use ONLY the CHG-compatible lotions on the back of this paper.     Additional instructions for the day of surgery: DO NOT APPLY any lotions, deodorants, cologne, or perfumes.   Put on clean/comfortable clothes.  Brush your teeth.  Ask your nurse before applying any prescription medications to the skin.      CHG Compatible Lotions   Aveeno Moisturizing lotion  Cetaphil Moisturizing Cream  Cetaphil Moisturizing Lotion  Clairol Herbal Essence Moisturizing Lotion, Dry Skin  Clairol Herbal Essence Moisturizing Lotion, Extra Dry Skin  Clairol Herbal Essence Moisturizing Lotion, Normal Skin  Curel Age Defying Therapeutic Moisturizing Lotion with Alpha Hydroxy  Curel Extreme Care Body Lotion  Curel Soothing Hands Moisturizing Hand Lotion  Curel Therapeutic Moisturizing Cream, Fragrance-Free  Curel Therapeutic Moisturizing Lotion, Fragrance-Free  Curel Therapeutic Moisturizing Lotion, Original Formula  Eucerin Daily Replenishing Lotion  Eucerin Dry Skin Therapy Plus Alpha Hydroxy Crme  Eucerin Dry Skin Therapy Plus Alpha Hydroxy Lotion  Eucerin Original Crme  Eucerin Original Lotion  Eucerin Plus Crme Eucerin Plus Lotion  Eucerin TriLipid Replenishing Lotion  Keri Anti-Bacterial Hand Lotion  Keri Deep Conditioning Original Lotion Dry Skin Formula Softly Scented  Keri Deep Conditioning Original Lotion, Fragrance  Free Sensitive Skin Formula  Keri Lotion Fast Absorbing Fragrance Free Sensitive Skin Formula  Keri Lotion Fast Absorbing Softly Scented Dry Skin Formula  Keri Original Lotion  Keri Skin Renewal Lotion Keri Silky Smooth Lotion  Keri Silky Smooth Sensitive Skin Lotion  Nivea Body Creamy Conditioning Oil  Nivea Body Extra Enriched Teacher, adult education Moisturizing Lotion Nivea Crme  Nivea Skin Firming Lotion  NutraDerm 30 Skin Lotion  NutraDerm Skin Lotion  NutraDerm Therapeutic Skin Cream  NutraDerm Therapeutic Skin Lotion  ProShield Protective Hand Cream  Provon moisturizing lotion

## 2023-07-20 ENCOUNTER — Encounter (HOSPITAL_COMMUNITY)
Admission: RE | Admit: 2023-07-20 | Discharge: 2023-07-20 | Disposition: A | Discharge status: Home / Self Care | Payer: Medicare Other | Source: Ambulatory Visit | Attending: Orthopedic Surgery | Admitting: Orthopedic Surgery

## 2023-07-20 ENCOUNTER — Encounter (HOSPITAL_COMMUNITY): Payer: Self-pay

## 2023-07-20 ENCOUNTER — Other Ambulatory Visit: Payer: Self-pay

## 2023-07-20 VITALS — BP 186/106 | HR 102 | Temp 98.2°F | Resp 16 | Ht 68.0 in

## 2023-07-20 DIAGNOSIS — M25552 Pain in left hip: Secondary | ICD-10-CM | POA: Insufficient documentation

## 2023-07-20 DIAGNOSIS — G8929 Other chronic pain: Secondary | ICD-10-CM | POA: Insufficient documentation

## 2023-07-20 DIAGNOSIS — Z01812 Encounter for preprocedural laboratory examination: Secondary | ICD-10-CM | POA: Diagnosis present

## 2023-07-20 DIAGNOSIS — Z01818 Encounter for other preprocedural examination: Secondary | ICD-10-CM | POA: Diagnosis not present

## 2023-07-20 DIAGNOSIS — M1652 Unilateral post-traumatic osteoarthritis, left hip: Secondary | ICD-10-CM | POA: Diagnosis not present

## 2023-07-20 DIAGNOSIS — Z01810 Encounter for preprocedural cardiovascular examination: Secondary | ICD-10-CM | POA: Diagnosis present

## 2023-07-20 HISTORY — DX: Cerebral infarction, unspecified: I63.9

## 2023-07-20 HISTORY — DX: Unspecified osteoarthritis, unspecified site: M19.90

## 2023-07-20 LAB — COMPREHENSIVE METABOLIC PANEL
ALT: 9 U/L (ref 0–44)
AST: 11 U/L — ABNORMAL LOW (ref 15–41)
Albumin: 3.5 g/dL (ref 3.5–5.0)
Alkaline Phosphatase: 97 U/L (ref 38–126)
Anion gap: 10 (ref 5–15)
BUN: 6 mg/dL — ABNORMAL LOW (ref 8–23)
CO2: 30 mmol/L (ref 22–32)
Calcium: 8.7 mg/dL — ABNORMAL LOW (ref 8.9–10.3)
Chloride: 97 mmol/L — ABNORMAL LOW (ref 98–111)
Creatinine, Ser: 0.64 mg/dL (ref 0.61–1.24)
GFR, Estimated: >60 mL/min (ref >60)
Glucose, Bld: 100 mg/dL — ABNORMAL HIGH (ref 70–99)
Potassium: 3.1 mmol/L — ABNORMAL LOW (ref 3.5–5.1)
Sodium: 137 mmol/L (ref 135–145)
Total Bilirubin: 0.4 mg/dL (ref <1.2)
Total Protein: 7.5 g/dL (ref 6.5–8.1)

## 2023-07-20 LAB — CBC WITH DIFFERENTIAL/PLATELET
Abs Immature Granulocytes: 0.03 10*3/uL (ref 0.00–0.07)
Basophils Absolute: 0 10*3/uL (ref 0.0–0.1)
Basophils Relative: 0 %
Eosinophils Absolute: 0.2 10*3/uL (ref 0.0–0.5)
Eosinophils Relative: 3 %
HCT: 39.9 % (ref 39.0–52.0)
Hemoglobin: 12.8 g/dL — ABNORMAL LOW (ref 13.0–17.0)
Immature Granulocytes: 1 %
Lymphocytes Relative: 23 %
Lymphs Abs: 1.4 10*3/uL (ref 0.7–4.0)
MCH: 30.9 pg (ref 26.0–34.0)
MCHC: 32.1 g/dL (ref 30.0–36.0)
MCV: 96.4 fL (ref 80.0–100.0)
Monocytes Absolute: 0.4 10*3/uL (ref 0.1–1.0)
Monocytes Relative: 6 %
Neutro Abs: 4.1 10*3/uL (ref 1.7–7.7)
Neutrophils Relative %: 67 %
Platelets: 292 10*3/uL (ref 150–400)
RBC: 4.14 MIL/uL — ABNORMAL LOW (ref 4.22–5.81)
RDW: 13.2 % (ref 11.5–15.5)
WBC: 6.1 10*3/uL (ref 4.0–10.5)
nRBC: 0 % (ref 0.0–0.2)

## 2023-07-20 LAB — SURGICAL PCR SCREEN
MRSA, PCR: NEGATIVE
Staphylococcus aureus: POSITIVE — AB

## 2023-07-23 DIAGNOSIS — I10 Essential (primary) hypertension: Secondary | ICD-10-CM | POA: Diagnosis not present

## 2023-07-23 NOTE — Progress Notes (Signed)
Case: [id number] Date/Time: 07/30/23 1446   Procedure: CONVERSION TO TOTAL HIP (Left: Hip)   Anesthesia type: Spinal   Pre-op diagnosis: LEFT HIP NONUNION, PAINFUL HARDWARE, TRAUMATIC OA   Location: [facility] ROOM 08 / [facility]   Surgeons: Joen Laura, MD       DISCUSSION: Jonathon Anderson is a 67 yo male who presents to PAT prior to surgery above. PMH of former smoking, HTN, hx of CVA, arthritis, chronic back pain.  Patient follows with PCP. Last seen on 04/23/23. BP noted to be at goal at that visit. Per Dr. Nathanial Rancher: "Patient to have left THA soon with spinal anesthesia rather than general endotracheal, so lower risk surgery. No cardiac symptoms. If labs today do not show a contraindication, will clear for surgery. He will have anesthesia appointment also for pre-op exam, usually EKG is done then, will likely have other labs repeated"  Medical clearance signed that patient is optimized from medical/cardiac perspective and to hold aspirin and diclofenac prior to surgery dated 04/23/23.  Of note patient's BP was elevated at PAT visit. EKG showing sinus tachycardia. Patient asymptomatic. He reported to PAT RN that he was prescribed another BP med but has not picked it up yet. Also states he has been taking Sudafed but was advised to stop this medication. He was advised to take BP meds as prescribed and informed of DOS risk of cancellation if BP remains uncontrolled. Encouraged to f/u with PCP or go to the ED if he develops any symptoms.  VS: BP (!) 186/106   Pulse (!) 102   Temp 36.8 C (Oral)   Resp 16   Ht 5\' 8"  (1.727 m)   SpO2 98%   BMI 32.08 kg/m   PROVIDERS: Hamrick, Durward Fortes, MD   LABS: Labs reviewed: Acceptable for surgery. (all labs ordered are listed, but only abnormal results are displayed)  Labs Reviewed  SURGICAL PCR SCREEN - Abnormal; Notable for the following components:      Result Value   Staphylococcus aureus POSITIVE (*)    All other components within normal limits   CBC WITH DIFFERENTIAL/PLATELET - Abnormal; Notable for the following components:   RBC 4.14 (*)    Hemoglobin 12.8 (*)    All other components within normal limits  COMPREHENSIVE METABOLIC PANEL - Abnormal; Notable for the following components:   Potassium 3.1 (*)    Chloride 97 (*)    Glucose, Bld 100 (*)    BUN 6 (*)    Calcium 8.7 (*)    AST 11 (*)    All other components within normal limits  TYPE AND SCREEN     IMAGES:   EKG 07/20/23  Sinus tachycardia, rate 104  CV:  Past Medical History:  Diagnosis Date   Arthritis    Hyperlipidemia    Hypertension    Nicotine dependence    Stroke (HCC)    LIGHT STROKE - NUMBNESS AND TINGLING IN LEFT HAND - 10/2008 APPROX    Past Surgical History:  Procedure Laterality Date   FEMUR IM NAIL Left 07/27/2022   Procedure: REPAIR FEMUR WITH AUTOGRAFT/ RIA;  Surgeon: Myrene Galas, MD;  Location: [facility] OR;  Service: Orthopedics;  Laterality: Left;   HARDWARE REMOVAL Left 06/06/2022   Procedure: HARDWARE REMOVAL;  Surgeon: Myrene Galas, MD;  Location: [facility];  Service: Orthopedics;  Laterality: Left;   HARDWARE REMOVAL Bilateral 07/27/2022   Procedure: REPAIR OF LEFT PROXIMAL FEMUR NONUNION, REMOVAL OF ANTIBIOTIC SPACER, REAMED INTERMEDULLARY ASPRIATE RIGHT FEMUR;  Surgeon:  Myrene Galas, MD;  Location: [facility] OR;  Service: Orthopedics;  Laterality: Bilateral;   INTRAMEDULLARY (IM) NAIL INTERTROCHANTERIC Left 05/11/2021   Procedure: INTRAMEDULLARY (IM) NAIL INTERTROCHANTRIC OF LEFT SUBTHROCHANTRIC HIP FRACTURE;  Surgeon: Christena Flake, MD;  Location: [facility] ORS;  Service: Orthopedics;  Laterality: Left;   ORIF FEMUR FRACTURE Left 06/06/2022   Procedure: OPEN REDUCTION INTERNAL FIXATION FEMORAL PROXIMAL FRACTURE;  Surgeon: Myrene Galas, MD;  Location: [facility] OR;  Service: Orthopedics;  Laterality: Left;    MEDICATIONS:  acetaminophen (TYLENOL) 500 MG tablet   aspirin EC 81 MG tablet   atorvastatin (LIPITOR) 20 MG tablet   Calcium  Carbonate-Vitamin D (CALTRATE 600+D PO)   carboxymethylcellulose (REFRESH PLUS) 0.5 % SOLN   Coenzyme Q10 (COQ10 PO)   cyclobenzaprine (FLEXERIL) 10 MG tablet   diclofenac (VOLTAREN) 75 MG EC tablet   magnesium hydroxide (MILK OF MAGNESIA) 400 MG/5ML suspension   Menthol, Topical Analgesic, (BIOFREEZE EX)   metoprolol tartrate (LOPRESSOR) 100 MG tablet   Omega-3 Fatty Acids (FISH OIL) 1200 MG CAPS   Potassium 99 MG TABS   No current facility-administered medications for this encounter.   Marcille Blanco [facility] Surgical Short Stay/Anesthesiology [facility] Phone [phone] 07/23/2023 1:24 PM

## 2023-07-23 NOTE — Anesthesia Preprocedure Evaluation (Signed)
Anesthesia Evaluation    Airway        Dental   Pulmonary former smoker          Cardiovascular hypertension,      Neuro/Psych    GI/Hepatic   Endo/Other    Renal/GU      Musculoskeletal   Abdominal   Peds  Hematology   Anesthesia Other Findings   Reproductive/Obstetrics                              Anesthesia Physical Anesthesia Plan  ASA:   Anesthesia Plan:    Post-op Pain Management:    Induction:   PONV Risk Score and Plan:   Airway Management Planned:   Additional Equipment:   Intra-op Plan:   Post-operative Plan:   Informed Consent:   Plan Discussed with:   Anesthesia Plan Comments: (See PAT note from 12/27 by Sherlie Ban PA-C )         Anesthesia Quick Evaluation

## 2023-07-30 ENCOUNTER — Encounter (HOSPITAL_COMMUNITY): Payer: Self-pay | Admitting: Medical

## 2023-07-30 ENCOUNTER — Encounter (HOSPITAL_COMMUNITY): Admission: RE | Payer: Self-pay | Source: Home / Self Care

## 2023-07-30 ENCOUNTER — Inpatient Hospital Stay (HOSPITAL_COMMUNITY): Admission: RE | Admit: 2023-07-30 | Payer: Medicare Other | Source: Home / Self Care | Admitting: Orthopedic Surgery

## 2023-07-30 LAB — TYPE AND SCREEN
ABO/RH(D): O POS
Antibody Screen: NEGATIVE

## 2023-07-30 SURGERY — CONVERSION, PREVIOUS HIP SURGERY, TO TOTAL HIP ARTHROPLASTY
Anesthesia: Spinal | Site: Hip | Laterality: Left

## 2023-08-06 ENCOUNTER — Ambulatory Visit: Payer: Self-pay | Admitting: Urology

## 2023-08-13 ENCOUNTER — Ambulatory Visit: Payer: Self-pay | Admitting: Urology

## 2023-09-03 DIAGNOSIS — I10 Essential (primary) hypertension: Secondary | ICD-10-CM | POA: Diagnosis not present

## 2023-09-03 DIAGNOSIS — M1612 Unilateral primary osteoarthritis, left hip: Secondary | ICD-10-CM | POA: Diagnosis not present

## 2023-09-12 ENCOUNTER — Ambulatory Visit: Payer: Self-pay | Admitting: Urology

## 2023-10-17 ENCOUNTER — Ambulatory Visit: Payer: Medicare Other | Admitting: Urology

## 2023-11-28 ENCOUNTER — Ambulatory Visit: Admitting: Urology

## 2023-12-18 DIAGNOSIS — E871 Hypo-osmolality and hyponatremia: Secondary | ICD-10-CM | POA: Diagnosis not present

## 2023-12-18 DIAGNOSIS — R972 Elevated prostate specific antigen [PSA]: Secondary | ICD-10-CM | POA: Diagnosis not present

## 2023-12-18 DIAGNOSIS — I10 Essential (primary) hypertension: Secondary | ICD-10-CM | POA: Diagnosis not present

## 2023-12-18 DIAGNOSIS — Z23 Encounter for immunization: Secondary | ICD-10-CM | POA: Diagnosis not present

## 2023-12-18 DIAGNOSIS — M545 Low back pain, unspecified: Secondary | ICD-10-CM | POA: Diagnosis not present

## 2023-12-18 DIAGNOSIS — E782 Mixed hyperlipidemia: Secondary | ICD-10-CM | POA: Diagnosis not present

## 2023-12-25 DIAGNOSIS — M25552 Pain in left hip: Secondary | ICD-10-CM | POA: Diagnosis not present

## 2023-12-26 NOTE — Progress Notes (Signed)
 Surgery orders requested via Epic inbox.

## 2023-12-31 ENCOUNTER — Ambulatory Visit: Admitting: Urology

## 2023-12-31 NOTE — Progress Notes (Signed)
 Second request for pre op orders in [facility] left voicemail with [employer].

## 2024-01-01 ENCOUNTER — Ambulatory Visit: Payer: Self-pay | Admitting: Emergency Medicine

## 2024-01-01 DIAGNOSIS — G8929 Other chronic pain: Secondary | ICD-10-CM

## 2024-01-01 NOTE — Patient Instructions (Addendum)
 SURGICAL WAITING ROOM VISITATION Patients having surgery or a procedure may have no more than 2 support people in the waiting area - these visitors may rotate.    Children under the age of 27 must have an adult with them who is not the patient.  If the patient needs to stay at the hospital during part of their recovery, the visitor guidelines for inpatient rooms apply. Pre-op nurse will coordinate an appropriate time for 1 support person to accompany patient in pre-op.  This support person may not rotate.    Please refer to the [facility] website for the visitor guidelines for Inpatients (after your surgery is over and you are in a regular room).       Your procedure is scheduled on: 01-09-24   Report to [facility] Main Entrance    Report to admitting at 8:30 AM   Call this number if you have problems the morning of surgery [phone]   Do not eat food :After Midnight.   After Midnight you may have the following liquids until 8:00 AM DAY OF SURGERY  Water  Non-Citrus Juices (without pulp, NO RED-Apple, White grape, White cranberry) Black Coffee (NO MILK/CREAM OR CREAMERS, sugar ok)  Clear Tea (NO MILK/CREAM OR CREAMERS, sugar ok) regular and decaf                             Plain Jell-O (NO RED)                                           Fruit ices (not with fruit pulp, NO RED)                                     Popsicles (NO RED)                                                               Sports drinks like Gatorade (NO RED)                   The day of surgery:  Drink ONE (1) Pre-Surgery Clear Ensure by 8:00 AM the morning of surgery. Drink in one sitting. Do not sip.  This drink was given to you during your hospital  pre-op appointment visit. Nothing else to drink after completing the Pre-Surgery Clear Ensure.          If you have questions, please contact your surgeon's office.   FOLLOW  ANY ADDITIONAL PRE OP INSTRUCTIONS YOU RECEIVED FROM YOUR SURGEON'S  OFFICE!!!     Oral Hygiene is also important to reduce your risk of infection.                                    Remember - BRUSH YOUR TEETH THE MORNING OF SURGERY WITH YOUR REGULAR TOOTHPASTE   Do NOT smoke after Midnight   Take these medicines the morning of surgery with A SIP OF WATER :    Amlodipine   Metoprolol   Tylenol  if needed  Stop all vitamins and herbal supplements 7 days before surgery  Bring CPAP mask and tubing day of surgery.                              You may not have any metal on your body including  jewelry, and body piercing             Do not wear  lotions, powders, cologne, or deodorant              Men may shave face and neck.   Do not bring valuables to the hospital. [facility] IS NOT RESPONSIBLE   FOR VALUABLES.   Contacts, dentures or bridgework may not be worn into surgery.   Bring small overnight bag day of surgery.   DO NOT BRING YOUR HOME MEDICATIONS TO THE HOSPITAL. PHARMACY WILL DISPENSE MEDICATIONS LISTED ON YOUR MEDICATION LIST TO YOU DURING YOUR ADMISSION IN THE HOSPITAL!   Special Instructions: Bring a copy of your healthcare power of attorney and living will documents the day of surgery if you haven't scanned them before.              Please read over the following fact sheets you were given: IF YOU HAVE QUESTIONS ABOUT YOUR PRE-OP INSTRUCTIONS PLEASE CALL [phone] G[facility]  If you received a COVID test during your pre-op visit  it is requested that you wear a mask when out in public, stay away from anyone that may not be feeling well and notify your surgeon if you develop symptoms. If you test positive for Covid or have been in contact with anyone that has tested positive in the last 10 days please notify you surgeon.    Pre-operative 5 CHG Bath Instructions   You can play a key role in reducing the risk of infection after surgery. Your skin needs to be as free of germs as possible. You can reduce the number of germs on your skin by  washing with CHG (chlorhexidine  gluconate) soap before surgery. CHG is an antiseptic soap that kills germs and continues to kill germs even after washing.   DO NOT use if you have an allergy to chlorhexidine /CHG or antibacterial soaps. If your skin becomes reddened or irritated, stop using the CHG and notify one of our RNs at [phone].   Please shower with the CHG soap starting 4 days before surgery using the following schedule:     Please keep in mind the following:  DO NOT shave, including legs and underarms, starting the day of your first shower.   You may shave your face at any point before/day of surgery.  Place clean sheets on your bed the day you start using CHG soap. Use a clean washcloth (not used since being washed) for each shower. DO NOT sleep with pets once you start using the CHG.   CHG Shower Instructions:  If you choose to wash your hair and private area, wash first with your normal shampoo/soap.  After you use shampoo/soap, rinse your hair and body thoroughly to remove shampoo/soap residue.  Turn the water  OFF and apply about 3 tablespoons (45 ml) of CHG soap to a CLEAN washcloth.  Apply CHG soap ONLY FROM YOUR NECK DOWN TO YOUR TOES (washing for 3-5 minutes)  DO NOT use CHG soap on face, private areas, open wounds, or sores.  Pay special attention to the area where your surgery is being performed.  If you are having back surgery, having someone wash your back for you may be helpful. Wait 2 minutes after CHG soap is applied, then you may rinse off the CHG soap.  Pat dry with a clean towel  Put on clean clothes/pajamas   If you choose to wear lotion, please use ONLY the CHG-compatible lotions on the back of this paper.     Additional instructions for the day of surgery: DO NOT APPLY any lotions, deodorants, cologne, or perfumes.   Put on clean/comfortable clothes.  Brush your teeth.  Ask your nurse before applying any prescription medications to the  skin.      CHG Compatible Lotions   Aveeno Moisturizing lotion  Cetaphil Moisturizing Cream  Cetaphil Moisturizing Lotion  Clairol Herbal Essence Moisturizing Lotion, Dry Skin  Clairol Herbal Essence Moisturizing Lotion, Extra Dry Skin  Clairol Herbal Essence Moisturizing Lotion, Normal Skin  Curel Age Defying Therapeutic Moisturizing Lotion with Alpha Hydroxy  Curel Extreme Care Body Lotion  Curel Soothing Hands Moisturizing Hand Lotion  Curel Therapeutic Moisturizing Cream, Fragrance-Free  Curel Therapeutic Moisturizing Lotion, Fragrance-Free  Curel Therapeutic Moisturizing Lotion, Original Formula  Eucerin Daily Replenishing Lotion  Eucerin Dry Skin Therapy Plus Alpha Hydroxy Crme  Eucerin Dry Skin Therapy Plus Alpha Hydroxy Lotion  Eucerin Original Crme  Eucerin Original Lotion  Eucerin Plus Crme Eucerin Plus Lotion  Eucerin TriLipid Replenishing Lotion  Keri Anti-Bacterial Hand Lotion  Keri Deep Conditioning Original Lotion Dry Skin Formula Softly Scented  Keri Deep Conditioning Original Lotion, Fragrance Free Sensitive Skin Formula  Keri Lotion Fast Absorbing Fragrance Free Sensitive Skin Formula  Keri Lotion Fast Absorbing Softly Scented Dry Skin Formula  Keri Original Lotion  Keri Skin Renewal Lotion Keri Silky Smooth Lotion  Keri Silky Smooth Sensitive Skin Lotion  Nivea Body Creamy Conditioning Oil  Nivea Body Extra Enriched Lotion  Nivea Body Original Lotion  Nivea Body Sheer Moisturizing Lotion Nivea Crme  Nivea Skin Firming Lotion  NutraDerm 30 Skin Lotion  NutraDerm Skin Lotion  NutraDerm Therapeutic Skin Cream  NutraDerm Therapeutic Skin Lotion  ProShield Protective Hand Cream  Provon moisturizing lotion   PATIENT SIGNATURE_________________________________  NURSE SIGNATURE__________________________________  ________________________________________________________________________    Benjamen Brand  An incentive spirometer is a tool that  can help keep your lungs clear and active. This tool measures how well you are filling your lungs with each breath. Taking long deep breaths may help reverse or decrease the chance of developing breathing (pulmonary) problems (especially infection) following: A long period of time when you are unable to move or be active. BEFORE THE PROCEDURE  If the spirometer includes an indicator to show your best effort, your nurse or respiratory therapist will set it to a desired goal. If possible, sit up straight or lean slightly forward. Try not to slouch. Hold the incentive spirometer in an upright position. INSTRUCTIONS FOR USE  Sit on the edge of your bed if possible, or sit up as far as you can in bed or on a chair. Hold the incentive spirometer in an upright position. Breathe out normally. Place the mouthpiece in your mouth and seal your lips tightly around it. Breathe in slowly and as deeply as possible, raising the piston or the ball toward the top of the column. Hold your breath for 3-5 seconds or for as long as possible. Allow the piston or ball to fall to the bottom of the column. Remove the mouthpiece from your mouth and breathe out normally. Rest for a few seconds and  repeat Steps 1 through 7 at least 10 times every 1-2 hours when you are awake. Take your time and take a few normal breaths between deep breaths. The spirometer may include an indicator to show your best effort. Use the indicator as a goal to work toward during each repetition. After each set of 10 deep breaths, practice coughing to be sure your lungs are clear. If you have an incision (the cut made at the time of surgery), support your incision when coughing by placing a pillow or rolled up towels firmly against it. Once you are able to get out of bed, walk around indoors and cough well. You may stop using the incentive spirometer when instructed by your caregiver.  RISKS AND COMPLICATIONS Take your time so you do not get dizzy or  light-headed. If you are in pain, you may need to take or ask for pain medication before doing incentive spirometry. It is harder to take a deep breath if you are having pain. AFTER USE Rest and breathe slowly and easily. It can be helpful to keep track of a log of your progress. Your caregiver can provide you with a simple table to help with this. If you are using the spirometer at home, follow these instructions: SEEK MEDICAL CARE IF:  You are having difficultly using the spirometer. You have trouble using the spirometer as often as instructed. Your pain medication is not giving enough relief while using the spirometer. You develop fever of 100.5 F (38.1 C) or higher. SEEK IMMEDIATE MEDICAL CARE IF:  You cough up bloody sputum that had not been present before. You develop fever of 102 F (38.9 C) or greater. You develop worsening pain at or near the incision site. MAKE SURE YOU:  Understand these instructions. Will watch your condition. Will get help right away if you are not doing well or get worse. Document Released: 11/20/2006 Document Revised: 10/02/2011 Document Reviewed: 01/21/2007 Exit[employer] Patient Information 2014 [employer], [place].   ________________________________________________________________________ WHAT IS A BLOOD TRANSFUSION? Blood Transfusion Information  A transfusion is the replacement of blood or some of its parts. Blood is made up of multiple cells which provide different functions. Red blood cells carry oxygen and are used for blood loss replacement. White blood cells fight against infection. Platelets control bleeding. Plasma helps clot blood. Other blood products are available for specialized needs, such as hemophilia or other clotting disorders. BEFORE THE TRANSFUSION  Who gives blood for transfusions?  Healthy volunteers who are fully evaluated to make sure their blood is safe. This is blood bank blood. Transfusion therapy is the safest it has ever been  in the practice of medicine. Before blood is taken from a donor, a complete history is taken to make sure that person has no history of diseases nor engages in risky social behavior (examples are intravenous drug use or sexual activity with multiple partners). The donor's travel history is screened to minimize risk of transmitting infections, such as malaria. The donated blood is tested for signs of infectious diseases, such as HIV and hepatitis. The blood is then tested to be sure it is compatible with you in order to minimize the chance of a transfusion reaction. If you or a relative donates blood, this is often done in anticipation of surgery and is not appropriate for emergency situations. It takes many days to process the donated blood. RISKS AND COMPLICATIONS Although transfusion therapy is very safe and saves many lives, the main dangers of transfusion include:  Getting an  infectious disease. Developing a transfusion reaction. This is an allergic reaction to something in the blood you were given. Every precaution is taken to prevent this. The decision to have a blood transfusion has been considered carefully by your caregiver before blood is given. Blood is not given unless the benefits outweigh the risks. AFTER THE TRANSFUSION Right after receiving a blood transfusion, you will usually feel much better and more energetic. This is especially true if your red blood cells have gotten low (anemic). The transfusion raises the level of the red blood cells which carry oxygen, and this usually causes an energy increase. The nurse administering the transfusion will monitor you carefully for complications. HOME CARE INSTRUCTIONS  No special instructions are needed after a transfusion. You may find your energy is better. Speak with your caregiver about any limitations on activity for underlying diseases you may have. SEEK MEDICAL CARE IF:  Your condition is not improving after your transfusion. You develop  redness or irritation at the intravenous (IV) site. SEEK IMMEDIATE MEDICAL CARE IF:  Any of the following symptoms occur over the next 12 hours: Shaking chills. You have a temperature by mouth above 102 F (38.9 C), not controlled by medicine. Chest, back, or muscle pain. People around you feel you are not acting correctly or are confused. Shortness of breath or difficulty breathing. Dizziness and fainting. You get a rash or develop hives. You have a decrease in urine output. Your urine turns a dark color or changes to pink, red, or brown. Any of the following symptoms occur over the next 10 days: You have a temperature by mouth above 102 F (38.9 C), not controlled by medicine. Shortness of breath. Weakness after normal activity. The white part of the eye turns yellow (jaundice). You have a decrease in the amount of urine or are urinating less often. Your urine turns a dark color or changes to pink, red, or brown. Document Released: 07/07/2000 Document Revised: 10/02/2011 Document Reviewed: 02/24/2008 [facility] Patient Information 2014 [place], [place].  _______________________________________________________________________

## 2024-01-01 NOTE — H&P (View-Only) (Signed)
 TOTAL HIP CONVERSION ADMISSION H&P  Patient is admitted for conversion to left total hip arthroplasty.  Subjective:  Chief Complaint: left hip pain  HPI: Jonathon Anderson, 68 y.o. male, has a history of pain and functional disability in the left hip due to post-traumatic arthritis and patient has failed conservative treatments for greater than 12 weeks to include NSAID's and/or analgesics, use of assistive devices, and activity modification. The indications for the conversion total hip arthroplasty are progressive or worsening post-traumatic arthritis in the presence of a IM nail.  Onset of symptoms was gradual starting 2 years ago with gradually worsening course since that time.  Prior procedures on the left hip include IM Nail.  Patient currently rates pain in the left hip at 10 out of 10 with activity.  There is night pain, worsening of pain with activity and weight bearing, pain that interfers with activities of daily living, and pain with passive range of motion. Patient has evidence of post-traumatic arthritis by imaging studies.  This condition presents safety issues increasing the risk of falls.   There is no current active infection.  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   Closed disp comminuted fracture of shaft of left femur with nonunion 06/06/2022   Closed subtrochanteric fracture of hip, left, initial encounter (HCC) 05/10/2021   Hypertension 05/10/2021   Mixed hyperlipidemia 05/10/2021   Nicotine dependence 05/10/2021   Hyponatremia 05/10/2021   Past Medical History:  Diagnosis Date   Arthritis    Hyperlipidemia    Hypertension    Nicotine dependence    Stroke (HCC)    LIGHT STROKE - NUMBNESS AND TINGLING IN LEFT HAND - 10/2008 APPROX    Past Surgical History:  Procedure Laterality Date   FEMUR IM NAIL Left 07/27/2022   Procedure:  REPAIR FEMUR WITH AUTOGRAFT/ RIA;  Surgeon: Hardy Lia, MD;  Location: [facility] OR;  Service: Orthopedics;  Laterality: Left;   HARDWARE REMOVAL Left 06/06/2022   Procedure: HARDWARE REMOVAL;  Surgeon: Hardy Lia, MD;  Location: [facility] OR;  Service: Orthopedics;  Laterality: Left;   HARDWARE REMOVAL Bilateral 07/27/2022   Procedure: REPAIR OF LEFT PROXIMAL FEMUR NONUNION, REMOVAL OF ANTIBIOTIC SPACER, REAMED INTERMEDULLARY ASPRIATE RIGHT FEMUR;  Surgeon: Hardy Lia, MD;  Location: [facility] OR;  Service: Orthopedics;  Laterality: Bilateral;   INTRAMEDULLARY (IM) NAIL INTERTROCHANTERIC Left 05/11/2021   Procedure: INTRAMEDULLARY (IM) NAIL INTERTROCHANTRIC OF LEFT SUBTHROCHANTRIC HIP FRACTURE;  Surgeon: Elner Hahn, MD;  Location: [facility] ORS;  Service: Orthopedics;  Laterality: Left;   ORIF FEMUR FRACTURE Left 06/06/2022   Procedure: OPEN REDUCTION INTERNAL FIXATION FEMORAL PROXIMAL FRACTURE;  Surgeon: Hardy Lia, MD;  Location: [facility] OR;  Service: Orthopedics;  Laterality: Left;    Current Outpatient Medications  Medication Sig Dispense Refill Last Dose/Taking   acetaminophen  (TYLENOL ) 500 MG tablet Take 1,000 mg by mouth in the morning and at bedtime.      amLODipine (NORVASC) 10 MG tablet Take 10 mg by mouth in the morning.      atorvastatin  (LIPITOR) 20 MG tablet Take 20 mg by mouth at bedtime.      cyclobenzaprine  (FLEXERIL ) 10 MG tablet Take 1 tablet (10 mg total) by mouth every 12 (twelve) hours as needed for muscle spasms. (Patient taking differently: Take 10 mg by mouth in the morning and at bedtime.) 40 tablet 0    diclofenac (VOLTAREN)  75 MG EC tablet Take 75 mg by mouth 2 (two) times daily.      magnesium  hydroxide (MILK OF MAGNESIA) 400 MG/5ML suspension Take 15 mLs by mouth daily as needed (constipation.).      Menthol , Topical Analgesic, (BIOFREEZE EX) Apply 1 Application topically daily as needed (pain).      metoprolol  tartrate (LOPRESSOR ) 100 MG tablet Take 100 mg by mouth 2 (two) times  daily.      No current facility-administered medications for this visit.   No Known Allergies  Social History   Tobacco Use   Smoking status: Former    Current packs/day: 0.00    Types: Cigarettes    Quit date: 06/01/2022    Years since quitting: 1.5   Smokeless tobacco: Never  Substance Use Topics   Alcohol use: Not Currently    Comment: Previously heavy alcohol use 4-5 cans of beer/day but no alchol since 10/2020.    Family History  Problem Relation Age of Onset   Heart attack Mother    Hypertension Brother    Heart failure Paternal Grandfather       Review of Systems  Musculoskeletal:  Positive for arthralgias.  All other systems reviewed and are negative.   Objective:  Physical Exam Constitutional:      General: He is not in acute distress.    Appearance: Normal appearance. He is normal weight.  HENT:     Head: Normocephalic and atraumatic.  Eyes:     Extraocular Movements: Extraocular movements intact.     Conjunctiva/sclera: Conjunctivae normal.     Pupils: Pupils are equal, round, and reactive to light.  Cardiovascular:     Rate and Rhythm: Normal rate and regular rhythm.     Pulses: Normal pulses.     Heart sounds: Normal heart sounds.  Pulmonary:     Effort: Pulmonary effort is normal. No respiratory distress.     Breath sounds: Wheezing present. No rales.  Abdominal:     General: Bowel sounds are normal. There is no distension.     Palpations: Abdomen is soft.     Tenderness: There is no abdominal tenderness.  Musculoskeletal:        General: Tenderness present.     Cervical back: Normal range of motion and neck supple.     Comments: TTP over groin, lateral aspect, greater trochanter.  Mild IT band tenderness.  No significant swelling.  No overlying lesions of area of chief complaint.  Decreased strength and ROM due to elicited pain.  Dorsiflexion and plantarflexion intact.  BLE appear grossly neurovascularly intact.  Gait antalgic.  1+ PT pulse LLE.   1+ Pitting edema LLE.   Lymphadenopathy:     Cervical: No cervical adenopathy.  Skin:    General: Skin is warm and dry.     Capillary Refill: Capillary refill takes less than 2 seconds.     Findings: No erythema or rash.  Neurological:     General: No focal deficit present.     Mental Status: He is alert and oriented to person, place, and time.  Psychiatric:        Mood and Affect: Mood normal.        Behavior: Behavior normal.     Vital signs in last 24 hours: @VSRANGES @   Labs:   Estimated body mass index is 32.08 kg/m as calculated from the following:   Height as of 07/20/23: 5\' 8"  (1.727 m).   Weight as of 07/27/22: 95.7 kg.  Imaging Review:  Plain radiographs demonstrate severe post-traumatic degenerative joint disease of the left hip(s).  .The bone quality appears to be fair for age and reported activity level.      Assessment/Plan:  Post-traumatic arthritis, left hip(s), conversion to total hip arthroplasty  The patient history, physical examination, clinical judgement of the provider and imaging studies are consistent with severe post-traumatic arthritis of the left hip(s), previous IM Nail. Conversion total hip arthroplasty is deemed medically necessary. The treatment options including medical management, injection therapy, arthroscopy and arthroplasty were discussed at length. The risks and benefits of total hip arthroplasty were presented and reviewed. The risks due to aseptic loosening, infection, stiffness, dislocation/subluxation,  thromboembolic complications and other imponderables were discussed.  The patient acknowledged the explanation, agreed to proceed with the plan and consent was signed. Patient is being admitted for inpatient treatment for surgery, pain control, PT, OT, prophylactic antibiotics, VTE prophylaxis, progressive ambulation and ADL's and discharge planning. The patient is planning to be discharged home with home health services ([facility]).

## 2024-01-01 NOTE — Progress Notes (Signed)
 COVID Vaccine Completed:  Date of COVID positive in last 90 days:  PCP - Enos Harts, MD Cardiologist -   Chest x-ray -  EKG - 07-20-23 Epic Stress Test -  ECHO -  Cardiac Cath -  Pacemaker/ICD device last checked: Spinal Cord Stimulator:  Bowel Prep -   Sleep Study -  CPAP -   Fasting Blood Sugar -  Checks Blood Sugar _____ times a day  Last dose of GLP1 agonist-  N/A GLP1 instructions:  Hold 7 days before surgery    Last dose of SGLT-2 inhibitors-  N/A SGLT-2 instructions:  Hold 3 days before surgery    Blood Thinner Instructions:  Last dose:   Time: Aspirin  Instructions: Last Dose:  Activity level:  Can go up a flight of stairs and perform activities of daily living without stopping and without symptoms of chest pain or shortness of breath.  Able to exercise without symptoms  Unable to go up a flight of stairs without symptoms of     Anesthesia review:   Patient denies shortness of breath, fever, cough and chest pain at PAT appointment  Patient verbalized understanding of instructions that were given to them at the PAT appointment. Patient was also instructed that they will need to review over the PAT instructions again at home before surgery.

## 2024-01-01 NOTE — H&P (Signed)
 TOTAL HIP CONVERSION ADMISSION H&P  Patient is admitted for conversion to left total hip arthroplasty.  Subjective:  Chief Complaint: left hip pain  HPI: Jonathon Anderson, 68 y.o. male, has a history of pain and functional disability in the left hip due to post-traumatic arthritis and patient has failed conservative treatments for greater than 12 weeks to include NSAID's and/or analgesics, use of assistive devices, and activity modification. The indications for the conversion total hip arthroplasty are progressive or worsening post-traumatic arthritis in the presence of a IM nail.  Onset of symptoms was gradual starting 2 years ago with gradually worsening course since that time.  Prior procedures on the left hip include IM Nail.  Patient currently rates pain in the left hip at 10 out of 10 with activity.  There is night pain, worsening of pain with activity and weight bearing, pain that interfers with activities of daily living, and pain with passive range of motion. Patient has evidence of post-traumatic arthritis by imaging studies.  This condition presents safety issues increasing the risk of falls.   There is no current active infection.  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   Closed disp comminuted fracture of shaft of left femur with nonunion 06/06/2022   Closed subtrochanteric fracture of hip, left, initial encounter (HCC) 05/10/2021   Hypertension 05/10/2021   Mixed hyperlipidemia 05/10/2021   Nicotine dependence 05/10/2021   Hyponatremia 05/10/2021   Past Medical History:  Diagnosis Date   Arthritis    Hyperlipidemia    Hypertension    Nicotine dependence    Stroke (HCC)    LIGHT STROKE - NUMBNESS AND TINGLING IN LEFT HAND - 10/2008 APPROX    Past Surgical History:  Procedure Laterality Date   FEMUR IM NAIL Left 07/27/2022   Procedure:  REPAIR FEMUR WITH AUTOGRAFT/ RIA;  Surgeon: Hardy Lia, MD;  Location: [facility] OR;  Service: Orthopedics;  Laterality: Left;   HARDWARE REMOVAL Left 06/06/2022   Procedure: HARDWARE REMOVAL;  Surgeon: Hardy Lia, MD;  Location: [facility] OR;  Service: Orthopedics;  Laterality: Left;   HARDWARE REMOVAL Bilateral 07/27/2022   Procedure: REPAIR OF LEFT PROXIMAL FEMUR NONUNION, REMOVAL OF ANTIBIOTIC SPACER, REAMED INTERMEDULLARY ASPRIATE RIGHT FEMUR;  Surgeon: Hardy Lia, MD;  Location: [facility] OR;  Service: Orthopedics;  Laterality: Bilateral;   INTRAMEDULLARY (IM) NAIL INTERTROCHANTERIC Left 05/11/2021   Procedure: INTRAMEDULLARY (IM) NAIL INTERTROCHANTRIC OF LEFT SUBTHROCHANTRIC HIP FRACTURE;  Surgeon: Elner Hahn, MD;  Location: [facility] ORS;  Service: Orthopedics;  Laterality: Left;   ORIF FEMUR FRACTURE Left 06/06/2022   Procedure: OPEN REDUCTION INTERNAL FIXATION FEMORAL PROXIMAL FRACTURE;  Surgeon: Hardy Lia, MD;  Location: [facility] OR;  Service: Orthopedics;  Laterality: Left;    Current Outpatient Medications  Medication Sig Dispense Refill Last Dose/Taking   acetaminophen  (TYLENOL ) 500 MG tablet Take 1,000 mg by mouth in the morning and at bedtime.      amLODipine (NORVASC) 10 MG tablet Take 10 mg by mouth in the morning.      atorvastatin  (LIPITOR) 20 MG tablet Take 20 mg by mouth at bedtime.      cyclobenzaprine  (FLEXERIL ) 10 MG tablet Take 1 tablet (10 mg total) by mouth every 12 (twelve) hours as needed for muscle spasms. (Patient taking differently: Take 10 mg by mouth in the morning and at bedtime.) 40 tablet 0    diclofenac (VOLTAREN)  75 MG EC tablet Take 75 mg by mouth 2 (two) times daily.      magnesium  hydroxide (MILK OF MAGNESIA) 400 MG/5ML suspension Take 15 mLs by mouth daily as needed (constipation.).      Menthol , Topical Analgesic, (BIOFREEZE EX) Apply 1 Application topically daily as needed (pain).      metoprolol  tartrate (LOPRESSOR ) 100 MG tablet Take 100 mg by mouth 2 (two) times  daily.      No current facility-administered medications for this visit.   No Known Allergies  Social History   Tobacco Use   Smoking status: Former    Current packs/day: 0.00    Types: Cigarettes    Quit date: 06/01/2022    Years since quitting: 1.5   Smokeless tobacco: Never  Substance Use Topics   Alcohol use: Not Currently    Comment: Previously heavy alcohol use 4-5 cans of beer/day but no alchol since 10/2020.    Family History  Problem Relation Age of Onset   Heart attack Mother    Hypertension Brother    Heart failure Paternal Grandfather       Review of Systems  Musculoskeletal:  Positive for arthralgias.  All other systems reviewed and are negative.   Objective:  Physical Exam Constitutional:      General: He is not in acute distress.    Appearance: Normal appearance. He is normal weight.  HENT:     Head: Normocephalic and atraumatic.  Eyes:     Extraocular Movements: Extraocular movements intact.     Conjunctiva/sclera: Conjunctivae normal.     Pupils: Pupils are equal, round, and reactive to light.  Cardiovascular:     Rate and Rhythm: Normal rate and regular rhythm.     Pulses: Normal pulses.     Heart sounds: Normal heart sounds.  Pulmonary:     Effort: Pulmonary effort is normal. No respiratory distress.     Breath sounds: Wheezing present. No rales.  Abdominal:     General: Bowel sounds are normal. There is no distension.     Palpations: Abdomen is soft.     Tenderness: There is no abdominal tenderness.  Musculoskeletal:        General: Tenderness present.     Cervical back: Normal range of motion and neck supple.     Comments: TTP over groin, lateral aspect, greater trochanter.  Mild IT band tenderness.  No significant swelling.  No overlying lesions of area of chief complaint.  Decreased strength and ROM due to elicited pain.  Dorsiflexion and plantarflexion intact.  BLE appear grossly neurovascularly intact.  Gait antalgic.  1+ PT pulse LLE.   1+ Pitting edema LLE.   Lymphadenopathy:     Cervical: No cervical adenopathy.  Skin:    General: Skin is warm and dry.     Capillary Refill: Capillary refill takes less than 2 seconds.     Findings: No erythema or rash.  Neurological:     General: No focal deficit present.     Mental Status: He is alert and oriented to person, place, and time.  Psychiatric:        Mood and Affect: Mood normal.        Behavior: Behavior normal.     Vital signs in last 24 hours: @VSRANGES @   Labs:   Estimated body mass index is 32.08 kg/m as calculated from the following:   Height as of 07/20/23: 5\' 8"  (1.727 m).   Weight as of 07/27/22: 95.7 kg.  Imaging Review:  Plain radiographs demonstrate severe post-traumatic degenerative joint disease of the left hip(s).  .The bone quality appears to be fair for age and reported activity level.      Assessment/Plan:  Post-traumatic arthritis, left hip(s), conversion to total hip arthroplasty  The patient history, physical examination, clinical judgement of the provider and imaging studies are consistent with severe post-traumatic arthritis of the left hip(s), previous IM Nail. Conversion total hip arthroplasty is deemed medically necessary. The treatment options including medical management, injection therapy, arthroscopy and arthroplasty were discussed at length. The risks and benefits of total hip arthroplasty were presented and reviewed. The risks due to aseptic loosening, infection, stiffness, dislocation/subluxation,  thromboembolic complications and other imponderables were discussed.  The patient acknowledged the explanation, agreed to proceed with the plan and consent was signed. Patient is being admitted for inpatient treatment for surgery, pain control, PT, OT, prophylactic antibiotics, VTE prophylaxis, progressive ambulation and ADL's and discharge planning. The patient is planning to be discharged home with home health services ([facility]).

## 2024-01-02 ENCOUNTER — Encounter (HOSPITAL_COMMUNITY)
Admission: RE | Admit: 2024-01-02 | Discharge: 2024-01-02 | Disposition: A | Discharge status: Home / Self Care | Source: Ambulatory Visit | Attending: Orthopedic Surgery | Admitting: Orthopedic Surgery

## 2024-01-02 ENCOUNTER — Other Ambulatory Visit: Payer: Self-pay

## 2024-01-02 ENCOUNTER — Encounter (HOSPITAL_COMMUNITY): Payer: Self-pay

## 2024-01-02 VITALS — BP 154/86 | HR 83 | Temp 98.3°F | Resp 17 | Ht 68.0 in | Wt 224.0 lb

## 2024-01-02 DIAGNOSIS — Z01818 Encounter for other preprocedural examination: Secondary | ICD-10-CM | POA: Diagnosis not present

## 2024-01-02 DIAGNOSIS — M25552 Pain in left hip: Secondary | ICD-10-CM | POA: Diagnosis not present

## 2024-01-02 DIAGNOSIS — I10 Essential (primary) hypertension: Secondary | ICD-10-CM | POA: Insufficient documentation

## 2024-01-02 DIAGNOSIS — G8929 Other chronic pain: Secondary | ICD-10-CM | POA: Diagnosis not present

## 2024-01-02 LAB — CBC WITH DIFFERENTIAL/PLATELET
Abs Immature Granulocytes: 0.03 10*3/uL (ref 0.00–0.07)
Basophils Absolute: 0 10*3/uL (ref 0.0–0.1)
Basophils Relative: 0 %
Eosinophils Absolute: 0.2 10*3/uL (ref 0.0–0.5)
Eosinophils Relative: 3 %
HCT: 39.2 % (ref 39.0–52.0)
Hemoglobin: 12.4 g/dL — ABNORMAL LOW (ref 13.0–17.0)
Immature Granulocytes: 0 %
Lymphocytes Relative: 19 %
Lymphs Abs: 1.4 10*3/uL (ref 0.7–4.0)
MCH: 30.5 pg (ref 26.0–34.0)
MCHC: 31.6 g/dL (ref 30.0–36.0)
MCV: 96.6 fL (ref 80.0–100.0)
Monocytes Absolute: 0.3 10*3/uL (ref 0.1–1.0)
Monocytes Relative: 5 %
Neutro Abs: 5.3 10*3/uL (ref 1.7–7.7)
Neutrophils Relative %: 73 %
Platelets: 313 10*3/uL (ref 150–400)
RBC: 4.06 MIL/uL — ABNORMAL LOW (ref 4.22–5.81)
RDW: 13.4 % (ref 11.5–15.5)
WBC: 7.3 10*3/uL (ref 4.0–10.5)
nRBC: 0 % (ref 0.0–0.2)

## 2024-01-02 LAB — COMPREHENSIVE METABOLIC PANEL WITH GFR
ALT: 8 U/L (ref 0–44)
AST: 15 U/L (ref 15–41)
Albumin: 3.5 g/dL (ref 3.5–5.0)
Alkaline Phosphatase: 118 U/L (ref 38–126)
Anion gap: 9 (ref 5–15)
BUN: 6 mg/dL — ABNORMAL LOW (ref 8–23)
CO2: 26 mmol/L (ref 22–32)
Calcium: 8.4 mg/dL — ABNORMAL LOW (ref 8.9–10.3)
Chloride: 100 mmol/L (ref 98–111)
Creatinine, Ser: 0.82 mg/dL (ref 0.61–1.24)
GFR, Estimated: >60 mL/min (ref >60)
Glucose, Bld: 114 mg/dL — ABNORMAL HIGH (ref 70–99)
Potassium: 3.5 mmol/L (ref 3.5–5.1)
Sodium: 135 mmol/L (ref 135–145)
Total Bilirubin: 0.6 mg/dL (ref 0.0–1.2)
Total Protein: 7.7 g/dL (ref 6.5–8.1)

## 2024-01-02 LAB — SURGICAL PCR SCREEN
MRSA, PCR: NEGATIVE
Staphylococcus aureus: POSITIVE — AB

## 2024-01-03 NOTE — Progress Notes (Signed)
PCR results sent to Dr. Zachery Dakins to review.

## 2024-01-09 ENCOUNTER — Encounter (HOSPITAL_COMMUNITY)
Admission: RE | Disposition: A | Discharge status: Home Health | Payer: Self-pay | Source: Home / Self Care | Attending: Orthopedic Surgery

## 2024-01-09 ENCOUNTER — Inpatient Hospital Stay (HOSPITAL_COMMUNITY)

## 2024-01-09 ENCOUNTER — Other Ambulatory Visit: Payer: Self-pay

## 2024-01-09 ENCOUNTER — Inpatient Hospital Stay (HOSPITAL_COMMUNITY)
Admission: RE | Admit: 2024-01-09 | Discharge: 2024-01-14 | DRG: 470 | Disposition: A | Discharge status: Home Health | Attending: Orthopedic Surgery | Admitting: Orthopedic Surgery

## 2024-01-09 ENCOUNTER — Encounter (HOSPITAL_COMMUNITY): Payer: Self-pay | Admitting: Orthopedic Surgery

## 2024-01-09 ENCOUNTER — Inpatient Hospital Stay (HOSPITAL_COMMUNITY): Admitting: Anesthesiology

## 2024-01-09 DIAGNOSIS — S7222XK Displaced subtrochanteric fracture of left femur, subsequent encounter for closed fracture with nonunion: Secondary | ICD-10-CM

## 2024-01-09 DIAGNOSIS — I10 Essential (primary) hypertension: Secondary | ICD-10-CM | POA: Diagnosis present

## 2024-01-09 DIAGNOSIS — X58XXXD Exposure to other specified factors, subsequent encounter: Secondary | ICD-10-CM | POA: Diagnosis present

## 2024-01-09 DIAGNOSIS — S72142K Displaced intertrochanteric fracture of left femur, subsequent encounter for closed fracture with nonunion: Secondary | ICD-10-CM | POA: Diagnosis not present

## 2024-01-09 DIAGNOSIS — Z8673 Personal history of transient ischemic attack (TIA), and cerebral infarction without residual deficits: Secondary | ICD-10-CM

## 2024-01-09 DIAGNOSIS — S72142A Displaced intertrochanteric fracture of left femur, initial encounter for closed fracture: Secondary | ICD-10-CM

## 2024-01-09 DIAGNOSIS — Y838 Other surgical procedures as the cause of abnormal reaction of the patient, or of later complication, without mention of misadventure at the time of the procedure: Secondary | ICD-10-CM | POA: Diagnosis present

## 2024-01-09 DIAGNOSIS — S7292XK Unspecified fracture of left femur, subsequent encounter for closed fracture with nonunion: Principal | ICD-10-CM | POA: Diagnosis present

## 2024-01-09 DIAGNOSIS — Z472 Encounter for removal of internal fixation device: Secondary | ICD-10-CM | POA: Diagnosis not present

## 2024-01-09 DIAGNOSIS — M1652 Unilateral post-traumatic osteoarthritis, left hip: Secondary | ICD-10-CM | POA: Diagnosis not present

## 2024-01-09 DIAGNOSIS — T84115A Breakdown (mechanical) of internal fixation device of left femur, initial encounter: Secondary | ICD-10-CM | POA: Diagnosis present

## 2024-01-09 DIAGNOSIS — Z8249 Family history of ischemic heart disease and other diseases of the circulatory system: Secondary | ICD-10-CM

## 2024-01-09 DIAGNOSIS — M12552 Traumatic arthropathy, left hip: Secondary | ICD-10-CM | POA: Diagnosis present

## 2024-01-09 DIAGNOSIS — Z87891 Personal history of nicotine dependence: Secondary | ICD-10-CM | POA: Diagnosis not present

## 2024-01-09 DIAGNOSIS — Z96642 Presence of left artificial hip joint: Secondary | ICD-10-CM | POA: Diagnosis present

## 2024-01-09 DIAGNOSIS — Z01818 Encounter for other preprocedural examination: Secondary | ICD-10-CM

## 2024-01-09 DIAGNOSIS — D62 Acute posthemorrhagic anemia: Secondary | ICD-10-CM | POA: Diagnosis not present

## 2024-01-09 DIAGNOSIS — E782 Mixed hyperlipidemia: Secondary | ICD-10-CM | POA: Diagnosis present

## 2024-01-09 DIAGNOSIS — Z79899 Other long term (current) drug therapy: Secondary | ICD-10-CM

## 2024-01-09 HISTORY — PX: CONVERSION TO TOTAL HIP: SHX5784

## 2024-01-09 HISTORY — PX: APPLICATION OF WOUND VAC: SHX5189

## 2024-01-09 LAB — POCT I-STAT 7, (LYTES, BLD GAS, ICA,H+H)
Acid-Base Excess: 0 mmol/L (ref 0.0–2.0)
Bicarbonate: 24.7 mmol/L (ref 20.0–28.0)
Calcium, Ion: 1.1 mmol/L — ABNORMAL LOW (ref 1.15–1.40)
HCT: 29 % — ABNORMAL LOW (ref 39.0–52.0)
Hemoglobin: 9.9 g/dL — ABNORMAL LOW (ref 13.0–17.0)
O2 Saturation: 99 %
Potassium: 2.9 mmol/L — ABNORMAL LOW (ref 3.5–5.1)
Sodium: 138 mmol/L (ref 135–145)
TCO2: 26 mmol/L (ref 22–32)
pCO2 arterial: 40 mmHg (ref 32–48)
pH, Arterial: 7.399 (ref 7.35–7.45)
pO2, Arterial: 120 mmHg — ABNORMAL HIGH (ref 83–108)

## 2024-01-09 LAB — HEMOGLOBIN AND HEMATOCRIT, BLOOD
HCT: 26.6 % — ABNORMAL LOW (ref 39.0–52.0)
Hemoglobin: 8.8 g/dL — ABNORMAL LOW (ref 13.0–17.0)

## 2024-01-09 LAB — PREPARE RBC (CROSSMATCH)

## 2024-01-09 SURGERY — CONVERSION, PREVIOUS HIP SURGERY, TO TOTAL HIP ARTHROPLASTY
Anesthesia: General | Site: Hip | Laterality: Left

## 2024-01-09 MED ORDER — SODIUM CHLORIDE (PF) 0.9 % IJ SOLN
INTRAMUSCULAR | Status: AC
  Filled 2024-01-09: qty 30

## 2024-01-09 MED ORDER — FENTANYL CITRATE (PF) 100 MCG/2ML IJ SOLN
INTRAMUSCULAR | Status: AC
Start: 2024-01-09 — End: 2024-01-09
  Filled 2024-01-09: qty 2

## 2024-01-09 MED ORDER — METHOCARBAMOL 1000 MG/10ML IJ SOLN: 500.0000 mg | Freq: Four times a day (QID) | INTRAMUSCULAR | Status: DC | PRN

## 2024-01-09 MED ORDER — PHENOL 1.4 % MT LIQD: 1.0000 | OROMUCOSAL | Status: DC | PRN

## 2024-01-09 MED ORDER — DEXAMETHASONE SODIUM PHOSPHATE 10 MG/ML IJ SOLN
INTRAMUSCULAR | Status: DC | PRN
  Administered 2024-01-09: 8 mg via INTRAVENOUS

## 2024-01-09 MED ORDER — POLYETHYLENE GLYCOL 3350 17 G PO PACK: 17.0000 g | PACK | Freq: Every day | ORAL | Status: DC | PRN

## 2024-01-09 MED ORDER — OXYCODONE HCL 5 MG PO TABS: 5.0000 mg | ORAL_TABLET | Freq: Once | ORAL | Status: DC | PRN

## 2024-01-09 MED ORDER — SODIUM CHLORIDE 0.9 % IV SOLN
10.0000 mL/h | Freq: Once | INTRAVENOUS | Status: DC
Start: 2024-01-09 — End: 2024-01-14

## 2024-01-09 MED ORDER — LIDOCAINE 2% (20 MG/ML) 5 ML SYRINGE: INTRAMUSCULAR | Status: DC | PRN

## 2024-01-09 MED ORDER — WATER FOR IRRIGATION, STERILE IR SOLN
Status: DC | PRN
  Administered 2024-01-09 (×2): 1000 mL

## 2024-01-09 MED ORDER — ONDANSETRON HCL 4 MG PO TABS: 4.0000 mg | ORAL_TABLET | Freq: Three times a day (TID) | ORAL | 0 refills | Status: DC | PRN

## 2024-01-09 MED ORDER — SUGAMMADEX SODIUM 200 MG/2ML IV SOLN
INTRAVENOUS | Status: DC | PRN
  Administered 2024-01-09: 200 mg via INTRAVENOUS

## 2024-01-09 MED ORDER — SODIUM CHLORIDE 0.9 % IR SOLN
Status: DC | PRN
  Administered 2024-01-09: 3000 mL

## 2024-01-09 MED ORDER — OXYCODONE HCL 5 MG/5ML PO SOLN: 5.0000 mg | Freq: Once | ORAL | Status: DC | PRN

## 2024-01-09 MED ORDER — METOPROLOL TARTRATE 50 MG PO TABS
100.0000 mg | ORAL_TABLET | Freq: Two times a day (BID) | ORAL | Status: DC
  Administered 2024-01-09 – 2024-01-14 (×10): 100 mg via ORAL
  Filled 2024-01-09 (×10): qty 2

## 2024-01-09 MED ORDER — ACETAMINOPHEN 500 MG PO TABS: 1000.0000 mg | ORAL_TABLET | Freq: Three times a day (TID) | ORAL | Status: AC | PRN

## 2024-01-09 MED ORDER — PANTOPRAZOLE SODIUM 40 MG PO TBEC
40.0000 mg | DELAYED_RELEASE_TABLET | Freq: Every day | ORAL | Status: DC
  Administered 2024-01-09 – 2024-01-14 (×5): 40 mg via ORAL
  Filled 2024-01-09 (×6): qty 1

## 2024-01-09 MED ORDER — ASPIRIN 81 MG PO TBEC: 81.0000 mg | DELAYED_RELEASE_TABLET | Freq: Two times a day (BID) | ORAL | Status: DC

## 2024-01-09 MED ORDER — ZOLPIDEM TARTRATE 5 MG PO TABS
5.0000 mg | ORAL_TABLET | Freq: Every evening | ORAL | Status: DC | PRN
Start: 2024-01-09 — End: 2024-01-14

## 2024-01-09 MED ORDER — PROPOFOL 10 MG/ML IV BOLUS
INTRAVENOUS | Status: DC | PRN
  Administered 2024-01-09: 50 mg via INTRAVENOUS
  Administered 2024-01-09: 150 mg via INTRAVENOUS

## 2024-01-09 MED ORDER — MIDAZOLAM HCL 5 MG/5ML IJ SOLN
INTRAMUSCULAR | Status: DC | PRN
  Administered 2024-01-09: 2 mg via INTRAVENOUS

## 2024-01-09 MED ORDER — ALBUMIN HUMAN 5 % IV SOLN
INTRAVENOUS | Status: DC | PRN
Start: 2024-01-09 — End: 2024-01-09

## 2024-01-09 MED ORDER — FENTANYL CITRATE PF 50 MCG/ML IJ SOSY
25.0000 ug | PREFILLED_SYRINGE | INTRAMUSCULAR | Status: DC | PRN
  Administered 2024-01-09: 25 ug via INTRAVENOUS
  Administered 2024-01-09: 50 ug via INTRAVENOUS

## 2024-01-09 MED ORDER — ISOPROPYL ALCOHOL 70 % SOLN
Status: DC | PRN
  Administered 2024-01-09: 1 via TOPICAL

## 2024-01-09 MED ORDER — DIPHENHYDRAMINE HCL 12.5 MG/5ML PO ELIX: 12.5000 mg | ORAL_SOLUTION | ORAL | Status: DC | PRN

## 2024-01-09 MED ORDER — METHOCARBAMOL 500 MG PO TABS
500.0000 mg | ORAL_TABLET | Freq: Four times a day (QID) | ORAL | Status: DC | PRN
Start: 2024-01-09 — End: 2024-01-14
  Administered 2024-01-09 – 2024-01-14 (×8): 500 mg via ORAL
  Filled 2024-01-09 (×8): qty 1

## 2024-01-09 MED ORDER — ACETAMINOPHEN 500 MG PO TABS
1000.0000 mg | ORAL_TABLET | Freq: Once | ORAL | Status: DC
  Filled 2024-01-09: qty 2

## 2024-01-09 MED ORDER — ONDANSETRON HCL 4 MG/2ML IJ SOLN: 4.0000 mg | Freq: Four times a day (QID) | INTRAMUSCULAR | Status: DC | PRN

## 2024-01-09 MED ORDER — MUPIROCIN 2 % EX OINT: 1.0000 | TOPICAL_OINTMENT | Freq: Two times a day (BID) | CUTANEOUS | 0 refills | Status: DC

## 2024-01-09 MED ORDER — AMLODIPINE BESYLATE 10 MG PO TABS
10.0000 mg | ORAL_TABLET | Freq: Every morning | ORAL | Status: DC
  Administered 2024-01-10 – 2024-01-14 (×5): 10 mg via ORAL
  Filled 2024-01-09 (×5): qty 1

## 2024-01-09 MED ORDER — ONDANSETRON HCL 4 MG/2ML IJ SOLN
INTRAMUSCULAR | Status: AC
  Filled 2024-01-09: qty 2

## 2024-01-09 MED ORDER — ACETAMINOPHEN 325 MG PO TABS
325.0000 mg | ORAL_TABLET | Freq: Four times a day (QID) | ORAL | Status: DC | PRN
  Administered 2024-01-11 – 2024-01-14 (×3): 650 mg via ORAL
  Filled 2024-01-09 (×3): qty 2

## 2024-01-09 MED ORDER — CHLORHEXIDINE GLUCONATE 0.12 % MT SOLN
15.0000 mL | Freq: Once | OROMUCOSAL | Status: AC
  Administered 2024-01-09: 15 mL via OROMUCOSAL

## 2024-01-09 MED ORDER — KETOROLAC TROMETHAMINE 15 MG/ML IJ SOLN
7.5000 mg | Freq: Four times a day (QID) | INTRAMUSCULAR | Status: AC
  Administered 2024-01-10 (×3): 7.5 mg via INTRAVENOUS
  Filled 2024-01-09 (×3): qty 1

## 2024-01-09 MED ORDER — CEFAZOLIN SODIUM-DEXTROSE 2-4 GM/100ML-% IV SOLN
2.0000 g | Freq: Three times a day (TID) | INTRAVENOUS | Status: AC
  Administered 2024-01-09 – 2024-01-12 (×9): 2 g via INTRAVENOUS
  Filled 2024-01-09 (×9): qty 100

## 2024-01-09 MED ORDER — LACTATED RINGERS IV SOLN: INTRAVENOUS | Status: DC

## 2024-01-09 MED ORDER — PROPOFOL 1000 MG/100ML IV EMUL
INTRAVENOUS | Status: AC
  Filled 2024-01-09: qty 100

## 2024-01-09 MED ORDER — LIDOCAINE HCL (PF) 2 % IJ SOLN
INTRAMUSCULAR | Status: AC
  Filled 2024-01-09: qty 5

## 2024-01-09 MED ORDER — ROCURONIUM BROMIDE 10 MG/ML (PF) SYRINGE
PREFILLED_SYRINGE | INTRAVENOUS | Status: DC | PRN
Start: 2024-01-09 — End: 2024-01-09
  Administered 2024-01-09: 50 mg via INTRAVENOUS

## 2024-01-09 MED ORDER — 0.9 % SODIUM CHLORIDE (POUR BTL) OPTIME
TOPICAL | Status: DC | PRN
  Administered 2024-01-09: 1000 mL

## 2024-01-09 MED ORDER — DOCUSATE SODIUM 100 MG PO CAPS
100.0000 mg | ORAL_CAPSULE | Freq: Two times a day (BID) | ORAL | Status: DC
  Administered 2024-01-09 – 2024-01-14 (×9): 100 mg via ORAL
  Filled 2024-01-09 (×10): qty 1

## 2024-01-09 MED ORDER — SODIUM CHLORIDE 0.9 % IV SOLN: INTRAVENOUS | Status: DC

## 2024-01-09 MED ORDER — MAGNESIUM HYDROXIDE 400 MG/5ML PO SUSP
15.0000 mL | Freq: Every day | ORAL | Status: DC | PRN
  Filled 2024-01-09: qty 30

## 2024-01-09 MED ORDER — VASOPRESSIN 20 UNIT/ML IV SOLN
INTRAVENOUS | Status: AC
Start: 2024-01-09 — End: 2024-01-09
  Filled 2024-01-09: qty 1

## 2024-01-09 MED ORDER — CHLORHEXIDINE GLUCONATE 4 % EX SOLN: 1.0000 | CUTANEOUS | 1 refills | Status: DC

## 2024-01-09 MED ORDER — ATORVASTATIN CALCIUM 20 MG PO TABS
20.0000 mg | ORAL_TABLET | Freq: Every day | ORAL | Status: DC
  Administered 2024-01-09 – 2024-01-13 (×5): 20 mg via ORAL
  Filled 2024-01-09 (×5): qty 1

## 2024-01-09 MED ORDER — DEXAMETHASONE SODIUM PHOSPHATE 10 MG/ML IJ SOLN: 8.0000 mg | Freq: Once | INTRAMUSCULAR | Status: DC

## 2024-01-09 MED ORDER — AMISULPRIDE (ANTIEMETIC) 5 MG/2ML IV SOLN: 10.0000 mg | Freq: Once | INTRAVENOUS | Status: DC | PRN

## 2024-01-09 MED ORDER — DICLOFENAC SODIUM 75 MG PO TBEC: 75.0000 mg | DELAYED_RELEASE_TABLET | Freq: Two times a day (BID) | ORAL | 0 refills | Status: DC

## 2024-01-09 MED ORDER — SODIUM CHLORIDE (PF) 0.9 % IJ SOLN
INTRAMUSCULAR | Status: DC | PRN
  Administered 2024-01-09: 80 mL

## 2024-01-09 MED ORDER — ASPIRIN 81 MG PO CHEW
81.0000 mg | CHEWABLE_TABLET | Freq: Two times a day (BID) | ORAL | Status: DC
  Administered 2024-01-09 – 2024-01-14 (×10): 81 mg via ORAL
  Filled 2024-01-09 (×10): qty 1

## 2024-01-09 MED ORDER — PHENYLEPHRINE HCL-NACL 20-0.9 MG/250ML-% IV SOLN
INTRAVENOUS | Status: DC | PRN
  Administered 2024-01-09: 60 ug/min via INTRAVENOUS

## 2024-01-09 MED ORDER — ONDANSETRON HCL 4 MG/2ML IJ SOLN: 4.0000 mg | Freq: Once | INTRAMUSCULAR | Status: DC | PRN

## 2024-01-09 MED ORDER — FENTANYL CITRATE PF 50 MCG/ML IJ SOSY
PREFILLED_SYRINGE | INTRAMUSCULAR | Status: AC
  Filled 2024-01-09: qty 2

## 2024-01-09 MED ORDER — DEXAMETHASONE SODIUM PHOSPHATE 10 MG/ML IJ SOLN
INTRAMUSCULAR | Status: AC
  Filled 2024-01-09: qty 1

## 2024-01-09 MED ORDER — ALBUMIN HUMAN 5 % IV SOLN
INTRAVENOUS | Status: AC
Start: 2024-01-09 — End: 2024-01-09
  Filled 2024-01-09: qty 750

## 2024-01-09 MED ORDER — BUPIVACAINE LIPOSOME 1.3 % IJ SUSP: 10.0000 mL | Freq: Once | INTRAMUSCULAR | Status: DC

## 2024-01-09 MED ORDER — ORAL CARE MOUTH RINSE
15.0000 mL | Freq: Once | OROMUCOSAL | Status: AC
Start: 2024-01-09 — End: 2024-01-09

## 2024-01-09 MED ORDER — TRANEXAMIC ACID-NACL 1000-0.7 MG/100ML-% IV SOLN
1000.0000 mg | INTRAVENOUS | Status: AC
  Administered 2024-01-09: 1000 mg via INTRAVENOUS
  Filled 2024-01-09: qty 100

## 2024-01-09 MED ORDER — FENTANYL CITRATE PF 50 MCG/ML IJ SOSY: 25.0000 ug | PREFILLED_SYRINGE | INTRAMUSCULAR | Status: DC | PRN

## 2024-01-09 MED ORDER — CEFADROXIL 500 MG PO CAPS: 500.0000 mg | ORAL_CAPSULE | Freq: Two times a day (BID) | ORAL | 0 refills | Status: AC

## 2024-01-09 MED ORDER — SENNA 8.6 MG PO TABS
1.0000 | ORAL_TABLET | Freq: Two times a day (BID) | ORAL | Status: DC
  Administered 2024-01-09 – 2024-01-14 (×7): 8.6 mg via ORAL
  Filled 2024-01-09 (×10): qty 1

## 2024-01-09 MED ORDER — BUPIVACAINE-EPINEPHRINE (PF) 0.25% -1:200000 IJ SOLN
INTRAMUSCULAR | Status: AC
  Filled 2024-01-09: qty 30

## 2024-01-09 MED ORDER — PHENYLEPHRINE 80 MCG/ML (10ML) SYRINGE FOR IV PUSH (FOR BLOOD PRESSURE SUPPORT)
PREFILLED_SYRINGE | INTRAVENOUS | Status: AC
  Filled 2024-01-09: qty 10

## 2024-01-09 MED ORDER — POVIDONE-IODINE 10 % EX SWAB
2.0000 | Freq: Once | CUTANEOUS | Status: AC
  Administered 2024-01-09: 2 via TOPICAL

## 2024-01-09 MED ORDER — ONDANSETRON HCL 4 MG PO TABS: 4.0000 mg | ORAL_TABLET | Freq: Four times a day (QID) | ORAL | Status: DC | PRN

## 2024-01-09 MED ORDER — ALBUMIN HUMAN 5 % IV SOLN
INTRAVENOUS | Status: AC
Start: 2024-01-09 — End: 2024-01-09
  Filled 2024-01-09: qty 250

## 2024-01-09 MED ORDER — ONDANSETRON HCL 4 MG/2ML IJ SOLN
INTRAMUSCULAR | Status: DC | PRN
  Administered 2024-01-09: 4 mg via INTRAVENOUS

## 2024-01-09 MED ORDER — MIDAZOLAM HCL 2 MG/2ML IJ SOLN
INTRAMUSCULAR | Status: AC
  Filled 2024-01-09: qty 2

## 2024-01-09 MED ORDER — CYCLOBENZAPRINE HCL 10 MG PO TABS: 10.0000 mg | ORAL_TABLET | Freq: Two times a day (BID) | ORAL | 0 refills | Status: DC | PRN

## 2024-01-09 MED ORDER — HYDROMORPHONE HCL 1 MG/ML IJ SOLN: 0.5000 mg | INTRAMUSCULAR | Status: DC | PRN

## 2024-01-09 MED ORDER — OXYCODONE HCL 5 MG PO TABS
5.0000 mg | ORAL_TABLET | ORAL | Status: DC | PRN
  Administered 2024-01-10 – 2024-01-13 (×10): 10 mg via ORAL
  Filled 2024-01-09 (×11): qty 2

## 2024-01-09 MED ORDER — CEFAZOLIN SODIUM-DEXTROSE 2-4 GM/100ML-% IV SOLN
2.0000 g | INTRAVENOUS | Status: AC
  Administered 2024-01-09 (×2): 2 g via INTRAVENOUS
  Filled 2024-01-09: qty 100

## 2024-01-09 MED ORDER — ACETAMINOPHEN 500 MG PO TABS
1000.0000 mg | ORAL_TABLET | Freq: Four times a day (QID) | ORAL | Status: AC
  Administered 2024-01-10 (×3): 1000 mg via ORAL
  Filled 2024-01-09 (×3): qty 2

## 2024-01-09 MED ORDER — CEFAZOLIN SODIUM 1 G IJ SOLR
INTRAMUSCULAR | Status: AC
  Filled 2024-01-09: qty 20

## 2024-01-09 MED ORDER — BUPIVACAINE LIPOSOME 1.3 % IJ SUSP
INTRAMUSCULAR | Status: AC
  Filled 2024-01-09: qty 20

## 2024-01-09 MED ORDER — OXYCODONE HCL 5 MG PO TABS: 5.0000 mg | ORAL_TABLET | ORAL | 0 refills | Status: AC | PRN

## 2024-01-09 MED ORDER — MENTHOL 3 MG MT LOZG: 1.0000 | LOZENGE | OROMUCOSAL | Status: DC | PRN

## 2024-01-09 MED ORDER — FENTANYL CITRATE (PF) 100 MCG/2ML IJ SOLN
INTRAMUSCULAR | Status: DC | PRN
  Administered 2024-01-09 (×2): 50 ug via INTRAVENOUS
  Administered 2024-01-09: 100 ug via INTRAVENOUS

## 2024-01-09 MED ORDER — LIDOCAINE HCL (PF) 2 % IJ SOLN
INTRAMUSCULAR | Status: DC | PRN
  Administered 2024-01-09: 50 mg via INTRADERMAL

## 2024-01-09 SURGICAL SUPPLY — 85 items
BAG COUNTER SPONGE SURGICOUNT (BAG) IMPLANT
BAG ZIPLOCK 12X15 (MISCELLANEOUS) ×1 IMPLANT
BIT DRILL TRIDENT 4X40 SU (BIT) IMPLANT
BLADE SAW SAG 25X90X1.19 (BLADE) IMPLANT
BLADE SAW SGTL 81X20 HD (BLADE) IMPLANT
BUR OVAL CARBIDE 4.0 (BURR) IMPLANT
CANISTER WOUNDNEG PRESSURE 500 (CANNISTER) IMPLANT
CEMENT RESTRICTOR BONE PREP ST (Cement) IMPLANT
CHLORAPREP W/TINT 26 (MISCELLANEOUS) ×2 IMPLANT
CLIP APPLIE 11 MED OPEN (CLIP) IMPLANT
CNTNR URN SCR LID CUP LEK RST (MISCELLANEOUS) IMPLANT
COMP FEM PROX STD HIP GMRS (Joint) IMPLANT
COVER SURGICAL LIGHT HANDLE (MISCELLANEOUS) ×1 IMPLANT
CUP ACET TRID II E 54 MH (Cup) IMPLANT
DERMABOND ADVANCED .7 DNX12 (GAUZE/BANDAGES/DRESSINGS) IMPLANT
DRAPE C-ARM 42X120 X-RAY (DRAPES) IMPLANT
DRAPE C-ARMOR (DRAPES) IMPLANT
DRAPE HIP W/POCKET STRL (MISCELLANEOUS) ×1 IMPLANT
DRAPE INCISE IOBAN 66X45 STRL (DRAPES) IMPLANT
DRAPE INCISE IOBAN 85X60 (DRAPES) ×1 IMPLANT
DRAPE POUCH INSTRU U-SHP 10X18 (DRAPES) ×1 IMPLANT
DRAPE SHEET LG 3/4 BI-LAMINATE (DRAPES) ×3 IMPLANT
DRAPE U-SHAPE 47X51 STRL (DRAPES) ×2 IMPLANT
DRESSING PREVENA PLUS CUSTOM (GAUZE/BANDAGES/DRESSINGS) IMPLANT
DRSG AQUACEL AG ADV 3.5X10 (GAUZE/BANDAGES/DRESSINGS) IMPLANT
ELECT BLADE TIP CTD 4 INCH (ELECTRODE) ×1 IMPLANT
ELECT NDL TIP 2.8 STRL (NEEDLE) IMPLANT
ELECT NEEDLE TIP 2.8 STRL (NEEDLE) IMPLANT
ELECT PENCIL ROCKER SW 15FT (MISCELLANEOUS) ×1 IMPLANT
ELECT REM PT RETURN 15FT ADLT (MISCELLANEOUS) ×1 IMPLANT
EXTRACTOR BROKEN SCREW 3 (INSTRUMENTS) IMPLANT
EXTRACTOR BROKEN SCREW 4 (INSTRUMENTS) IMPLANT
GAUZE SPONGE 4X4 12PLY STRL LF (GAUZE/BANDAGES/DRESSINGS) ×1 IMPLANT
GLOVE BIO SURGEON STRL SZ 6.5 (GLOVE) ×2 IMPLANT
GLOVE BIO SURGEON STRL SZ8 (GLOVE) IMPLANT
GLOVE BIOGEL PI IND STRL 6.5 (GLOVE) ×1 IMPLANT
GLOVE BIOGEL PI IND STRL 7.0 (GLOVE) IMPLANT
GLOVE BIOGEL PI IND STRL 8 (GLOVE) ×1 IMPLANT
GLOVE BIOGEL PI IND STRL 8.5 (GLOVE) IMPLANT
GLOVE SURG ORTHO 8.0 STRL STRW (GLOVE) ×2 IMPLANT
GOWN STRL REUS W/ TWL XL LVL3 (GOWN DISPOSABLE) ×2 IMPLANT
HEAD BIOLOX HIP 28/+4 (Joint) IMPLANT
HOLDER FOLEY CATH W/STRAP (MISCELLANEOUS) ×1 IMPLANT
HOOD PEEL AWAY T7 (MISCELLANEOUS) ×3 IMPLANT
KIT BASIN OR (CUSTOM PROCEDURE TRAY) ×1 IMPLANT
KIT DRSG PREVENA PLUS 7DAY 125 (MISCELLANEOUS) IMPLANT
KIT TURNOVER KIT A (KITS) ×2 IMPLANT
LINER 42MM E (Orthopedic Implant) IMPLANT
LINER ADM MDM INS 28/48 42E (Liner) IMPLANT
MANIFOLD NEPTUNE II (INSTRUMENTS) ×1 IMPLANT
MARKER SKIN DUAL TIP RULER LAB (MISCELLANEOUS) ×1 IMPLANT
NDL SAFETY ECLIPSE 18X1.5 (NEEDLE) ×1 IMPLANT
NS IRRIG 1000ML POUR BTL (IV SOLUTION) ×1 IMPLANT
PACK TOTAL JOINT (CUSTOM PROCEDURE TRAY) ×1 IMPLANT
PAD ARMBOARD POSITIONER FOAM (MISCELLANEOUS) ×1 IMPLANT
PROTECTOR NERVE ULNAR (MISCELLANEOUS) ×1 IMPLANT
RETRIEVER SUT HEWSON (MISCELLANEOUS) ×1 IMPLANT
SCREW HEX LP 6.5X15 (Screw) IMPLANT
SCREW HEX LP 6.5X20 (Screw) IMPLANT
SCREW HEX LP 6.5X35 (Screw) IMPLANT
SEALER BIPOLAR AQUA 6.0 (INSTRUMENTS) IMPLANT
SET HNDPC FAN SPRY TIP SCT (DISPOSABLE) ×1 IMPLANT
SOLUTION PRONTOSAN WOUND 350ML (IRRIGATION / IRRIGATOR) IMPLANT
SPIKE FLUID TRANSFER (MISCELLANEOUS) ×3 IMPLANT
STAPLER SKIN PROX 35W (STAPLE) IMPLANT
STEM FEM EXT HIP CMT 13X127 (Stem) IMPLANT
STEM FEM EXT HIP REV GMRS 30 (Stem) IMPLANT
SUCTION TUBE FRAZIER 12FR DISP (SUCTIONS) ×1 IMPLANT
SUT BONE WAX W31G (SUTURE) IMPLANT
SUT ETHIBOND #5 BRAIDED 30INL (SUTURE) ×1 IMPLANT
SUT ETHILON 3 0 PS 1 (SUTURE) IMPLANT
SUT MNCRL AB 3-0 PS2 18 (SUTURE) ×1 IMPLANT
SUT NYLON 3 0 (SUTURE) IMPLANT
SUT STRATAFIX 14 PDO 48 VLT (SUTURE) ×1 IMPLANT
SUT VIC AB 0 CT1 36 (SUTURE) ×1 IMPLANT
SUT VIC AB 2-0 CT2 27 (SUTURE) ×2 IMPLANT
SUTURE STRATFX 0 PDS 27 VIOLET (SUTURE) ×1 IMPLANT
SYR 30ML LL (SYRINGE) ×2 IMPLANT
SYR 50ML LL SCALE MARK (SYRINGE) ×1 IMPLANT
TOWEL GREEN STERILE FF (TOWEL DISPOSABLE) ×1 IMPLANT
TOWEL OR 17X26 10 PK STRL BLUE (TOWEL DISPOSABLE) ×1 IMPLANT
TRAY FOLEY MTR SLVR 16FR STAT (SET/KITS/TRAYS/PACK) ×1 IMPLANT
TUBE SUCTION HIGH CAP CLEAR NV (SUCTIONS) ×1 IMPLANT
UNDERPAD 30X36 HEAVY ABSORB (UNDERPADS AND DIAPERS) ×1 IMPLANT
WATER STERILE IRR 1000ML POUR (IV SOLUTION) ×2 IMPLANT

## 2024-01-09 NOTE — Anesthesia Postprocedure Evaluation (Signed)
 Anesthesia Post Note  Patient: Jonathon Anderson  Procedure(s) Performed: CONVERSION TO TOTAL HIP (Left: Hip) APPLICATION, WOUND VAC (Left: Hip)     Patient location during evaluation: PACU Anesthesia Type: General Level of consciousness: awake and alert Pain management: pain level controlled Vital Signs Assessment: post-procedure vital signs reviewed and stable Respiratory status: spontaneous breathing, nonlabored ventilation and respiratory function stable Cardiovascular status: stable and blood pressure returned to baseline Anesthetic complications: no   No notable events documented.  Last Vitals:  Vitals:   01/09/24 1804 01/09/24 1815  BP:  120/74  Pulse:  87  Resp:  18  Temp:    SpO2: 95% 94%    Last Pain:  Vitals:   01/09/24 1815  TempSrc:   PainSc: Asleep                 Juventino Oppenheim

## 2024-01-09 NOTE — Transfer of Care (Signed)
 Immediate Anesthesia Transfer of Care Note  Patient: Jonathon Anderson  Procedure(s) Performed: CONVERSION TO TOTAL HIP (Left: Hip) APPLICATION, WOUND VAC (Left: Hip)  Patient Location: PACU  Anesthesia Type:General  Level of Consciousness: awake and alert   Airway & Oxygen Therapy: Patient Spontanous Breathing and Patient connected to face mask oxygen  Post-op Assessment: Report given to RN and Post -op Vital signs reviewed and stable  Post vital signs: Reviewed and stable  Last Vitals:  Vitals Value Taken Time  BP 105/70 01/09/24 17:32  Temp    Pulse 38 01/09/24 17:32  Resp 24 01/09/24 17:35  SpO2 96% 01/09/24 17:32  Vitals shown include unfiled device data.  Last Pain:  Vitals:   01/09/24 0848  TempSrc:   PainSc: 0-No pain         Complications: No notable events documented.

## 2024-01-09 NOTE — Op Note (Signed)
 01/09/2024  4:56 PM  PATIENT:  Jonathon Anderson   MRN: [id number]  PRE-OPERATIVE DIAGNOSIS: Left intertrochanteric femur fracture nonunion with posttraumatic arthritis  POST-OPERATIVE DIAGNOSIS:  same  PROCEDURE: Conversion left hip arthroplasty with hardware removal to proximal femur replacement with local tissue advancement and application of wound VAC  PREOPERATIVE INDICATIONS:    Jonathon Anderson is an 68 y.o. male who had sustained a left intertrochanteric femur fracture back in October 2022.  Unfortunately went on to fixation failure and nonunion of underwent revision ORIF with blade plate by Dr. Guyann Leitz in November 2023.  Unfortunately due to significant bone loss as well as persistent nonunion developed severe posttraumatic arthritis with femoral head collapse.  Also due to the nonunion the distal screws of the blade plate failed and broke.  Ultimately felt patient benefit from conversion to total hip arthroplasty with proximal femur replacement due to the persistent nonunion and poor proximal bone quality.  Preoperatively aspiration was negative for infection as well as cultures by Dr. Guyann Leitz at the last surgery.  Patient was medically optimized and elected for surgical management.  The risks benefits and alternatives were discussed with the patient including but not limited to the risks of nonoperative treatment, versus surgical intervention including infection, bleeding, nerve injury, periprosthetic fracture, the need for revision surgery, dislocation, leg length discrepancy, blood clots, cardiopulmonary complications, morbidity, mortality, among others, and they were willing to proceed.     OPERATIVE REPORT     SURGEON:  Priscille Brought, MD    ASSISTANT: Mason Sole, PA-C, (Present throughout the entire procedure,  necessary for completion of procedure in a timely manner, assisting with retraction, instrumentation, and closure)     ANESTHESIA: General  ESTIMATED BLOOD LOSS:  1300cc    COMPLICATIONS:  None.     UNIQUE ASPECTS OF THE CASE: Chronic nonunion of the left subtrochanteric femur fracture with severe posttraumatic arthritis, removal of the proximal femur bone with a osteotomy of the greater trochanter to maintain muscular attachments with conversion to a proximal femur replacement.  Successful removal of hardware including intracortical distal screws.   COMPONENTS:  Stryker Trident 254 mm acetabular shell multihole, 6.5 peg screws x 3, 42 mm alpha code E MDM cementless liner, G MR S proximal femoral replacement with V40 taper, 30 mm extension piece, 13 x 127 mm straight cemented stem, dual mobility head ball 28+4 mm ceramic head ball V40 taper with 28 x 48 ADM/MDM dual mobility head ball Implant Name Type Inv. Item Serial No. Manufacturer Lot No. LRB No. Used Action  Stryker BioPrep Bone Prepaaration kit     [id number] Left 1 Implanted  CUP ACET TRID II E 54 MH - [id number] Cup CUP ACET TRID II E 54 MH  [employer] [id number] Left 1 Implanted  SCREW HEX LP 6.5X35 - [id number] Screw SCREW HEX LP 6.5X35  [employer] [id number] Left 1 Implanted  SCREW HEX LP 6.5X15 - [id number] Screw SCREW HEX LP 6.5X15  [employer] [id number] Left 1 Implanted  SCREW HEX LP 6.5X20 - [id number] Screw SCREW HEX LP 6.5X20  [employer] [id number] Left 1 Implanted  LINER E - [id number] Orthopedic Implant LINER E  [employer] [id number] Left 1 Implanted  GMRS EXTENSION PIECE      Left 1 Implanted  STEM FEM EXT HIP CMT 13X127 - [id number] Stem STEM FEM EXT HIP CMT 13X127  [employer] [id number] Left 1 Implanted  COMP FEM PROX STD HIP GMRS - [id number] Joint COMP FEM PROX STD HIP  GMRS  [employer] [id number] Left 1 Implanted  LINER ADM MDM INS 28/48 42E - [id number] Liner LINER ADM MDM INS 28/48 42E  [employer] [id number] Left 1 Implanted  HEAD BIOLOX HIP 28/+4 - [id number] Joint HEAD BIOLOX HIP 28/+4  [employer] [id number] Left 1 Implanted     The aquamantis was utilized for this case to help facilitate better hemostasis as patient was felt to be at increased risk of bleeding because of complex case requiring increased OR time and/or exposure.       PROCEDURE IN DETAIL:   The patient was met in the holding area and  identified.  The appropriate hip was identified and marked at the operative site.  The patient was then transported to the OR  and  placed under anesthesia.  At that point, the patient was  placed in the lateral decubitus position with the operative side up and  secured to the operating room table  and all bony prominences padded. A subaxillary role was also placed.    The operative lower extremity was prepped from the iliac crest to the distal leg.  Sterile draping was performed.  Preoperative antibiotics, 2 gm of ancef ,1 gm of Tranexamic Acid , and 8 mg of Decadron  administered. Time out was performed prior to incision.      A routine posterolateral approach was utilized via sharp dissection  carried down to the subcutaneous tissue.  This was continuous with the patient's prior incision from his last blade plate surgery.  Gross bleeders were Bovie coagulated.  The iliotibial band was identified and incised along the length of the skin incision through the glute max fascia.  Charnley retractor was placed with care to protect the sciatic nerve posteriorly.  With the hip internally rotated, a posterior capsulotomy was then performed off the femoral insertion.  To better visualize our resection length elected to remove the blade plate.  Notably the blade plate was loose distally.  We extended our incision distally almost to the entire length of the prior incision.  The single screw in the proximal segment was removed and the blade plate was able to be successfully extracted laterally.  At this point we were able to visualize the nonunion site.  Using retractors were able to protect the resection length which we felt 11 mm to be  adequate given that the nonunion site was approximately 8 mm below the greater trochanter.  A saw was used to make the resection.  We also marked out a piece of the greater trochanter with attachment still to the vastus lateralis and abductors.  A trochanteric osteotomy was performed and this was translated anteriorly to allow for further dissection around the proximal femur.  We then proceeded with with continuing to gradually dissect soft tissues subperiosteally.  While coming around the lesser trochanter distally present posteriorly we encountered significant bleeding from the perforating vessels.  Was able to stop the bleeding with my finger after likely losing about 300 cc of blood.  We then carefully resected the proximal bone allowing for exposure of the deep soft tissues.  At this point relative visualized the transected vessel.  First this was tied off and then clipped.  Felt we had good control of the bleeding at this point and resume postsurgery.  The remainder of the proximal bone was then resected subperiosteally.    I then exposed the deep acetabulum, cleared out any tissue including the ligamentum teres.  After adequate visualization, I excised the labrum.  I then started  reaming with a 48 mm reamer, first medializing to the floor of the cotyloid fossa, and then in the position of the cup aiming towards the greater sciatic notch, matching the version of the transverse acetabular ligament and tucked under the anterior wall. I reamed up to 54 mm reamer with good bony bed preparation and a 54 mm cup was chosen.  The real cup was then impacted into place.  Appropriate version and inclination was confirmed clinically matching their bony anatomy, and also with the use of the jig.  I placed 3 screws in the posterior superior quadrant to augment fixation.  A MDM liner was placed and impacted. It was confirmed to be appropriately seated and the acetabular retractors were removed.    We then proceeded to  remove all of the broken screws.  Utilizing a high-speed burr and fluoroscopic guidance we burred small holes laterally to find the broken screws.  We then used a reverse threaded tract fine from the [facility] hardware removal set to reverse thread and captured the broken screw pieces. Once all 4 screws were removed we proceeded to preparing the canal for the cemented proximal femur replacement.  We reamed up to a 15 mm reamer for the 127 mm length stem.  We had good cortical purchase with a 15 mm straight reamer.  A trial stem and body was then placed and the hip was reduced. Leg lengths were clinically equal.  Fluoroscopy scopic imaging confirmed appropriate position of the components.  Difficult to assess stability however due to rotational mobility of the trial.  Overall felt we had good restoration of leg lengths and no evidence or concern for fracture.  The real femoral prosthesis was then opened and assembled on the back table.   We then prepared canal for cementation.  The cement restrictor was measured and inserted distally.  The canal was then irrigated with the pulse lavage and 3 L of normal saline.  2 bags of Simplex cement were prepared.  Using the cement gun the cement was inserted distally and the canal was filled.  We then pressurized the canal. The real implant was then inserted matching the patient's native anteversion of approximately 30 degrees.  We then waited for 15 minutes for the cement to be fully set.  Excess cement was removed.  A lap was placed in the acetabulum prior to cementing was also removed and the acetabulum was assessed to make sure there was no cement or bone fragments. I again trialed and selected a +57mm ball. The hip was then reduced and taken through a range of motion. There was no impingement with full extension and 90 degrees external rotation.  The hip was stable at the position of sleep and with 90 degrees flexion and 80 degrees of internal rotation. Leg lengths were   again assessed and felt to be restored.  We then opened, and assembled the dual mobility head ball on the back table, and I impacted the real head ball into place.  The trochanteric osteotomy piece was then reduced to the lateral side of the femoral implant and secured to the lateral implant with #5 Ethibond suture.  The posterior capsule was then closed with #5 Ethibond.    I then irrigated the hip copiously with dilute Irrisept irrigation and with normal saline pulse lavage. Periarticular injection was then performed with Exparel  and quarter percent Marcaine  with epinephrine .   We repaired the fascia #1 barbed suture, followed by 0 barbed suture for the subcutaneous fat.  Skin was closed with 2-0 Vicryl and staples.Mitzie Anda wound VAC  dressing were applied. The patient was then awakened and returned to PACU in stable and satisfactory condition.  Leg lengths in the supine position were assessed and felt to be clinically equal. There were no complications.  Post op recs: WB: WBAT LLE, 6 weeks of posterior precautions Abx: ancef  in-house and discharged on cefadroxil Imaging: PACU pelvis Xray Dressing: Prevena wound VAC DVT prophylaxis: Aspirin  81BID starting POD1 Follow up: 2 weeks after surgery for a wound check with Dr. Pryor [facility] at [facility].  Address: [address], [place], [place] [address]  Office Phone: [phone]   Priscille Brought, MD Orthopedic Surgeon

## 2024-01-09 NOTE — Discharge Instructions (Signed)
 INSTRUCTIONS AFTER JOINT REPLACEMENT   Remove items at home which could result in a fall. This includes throw rugs or furniture in walking pathways ICE to the affected joint every three hours while awake for 30 minutes at a time, for at least the first 3-5 days, and then as needed for pain and swelling.  Continue to use ice for pain and swelling. You may notice swelling that will progress down to the foot and ankle.  This is normal after surgery.  Elevate your leg when you are not up walking on it.   Continue to use the breathing machine you got in the hospital (incentive spirometer) which will help keep your temperature down.  It is common for your temperature to cycle up and down following surgery, especially at night when you are not up moving around and exerting yourself.  The breathing machine keeps your lungs expanded and your temperature down.  DIET:  As you were doing prior to hospitalization, we recommend a well-balanced diet.  DRESSING / WOUND CARE / SHOWERING:  Keep the surgical dressing until follow up.  This has battery powered suction to be kept at 125 mmHg and lasts 7 days.  If the battery stops sooner than 7 days, then call our office for further instructions.    The dressing is also water  resistant, however it is best to shower with an extra covering, such as saran wrap to keep it dry.  IF THE DRESSING FALLS OFF or the wound gets wet inside, change the dressing with sterile gauze and call our office for further instruction.  Please use good hand washing techniques before changing the dressing.  Do not use any lotions or creams on the incision until instructed by your surgeon.     ACTIVITY  Increase activity slowly as tolerated, but follow the weight bearing instructions below.   No driving for 6 weeks or until further direction given by your physician.  You cannot drive while taking narcotics.  No lifting or carrying greater than 10 lbs. until further directed by your  surgeon. Avoid periods of inactivity such as sitting longer than an hour when not asleep. This helps prevent blood clots.  You may return to work once you are authorized by your doctor.   WEIGHT BEARING: Weight bearing as tolerated with assist device (walker, cane, etc) as directed, use it as long as suggested by your surgeon or therapist, typically at least 4-6 weeks.  EXERCISES  Results after joint replacement surgery are often greatly improved when you follow the exercise, range of motion and muscle strengthening exercises prescribed by your doctor. Safety measures are also important to protect the joint from further injury. Any time any of these exercises cause you to have increased pain or swelling, decrease what you are doing until you are comfortable again and then slowly increase them. If you have problems or questions, call your caregiver or physical therapist for advice.   Rehabilitation is important following a joint replacement. After just a few days of immobilization, the muscles of the leg can become weakened and shrink (atrophy).  These exercises are designed to build up the tone and strength of the thigh and leg muscles and to improve motion. Often times heat used for twenty to thirty minutes before working out will loosen up your tissues and help with improving the range of motion but do not use heat for the first two weeks following surgery (sometimes heat can increase post-operative swelling).   These exercises can be done  on a training (exercise) mat, on the floor, on a table or on a bed. Use whatever works the best and is most comfortable for you.    Use music or television while you are exercising so that the exercises are a pleasant break in your day. This will make your life better with the exercises acting as a break in your routine that you can look forward to.   Perform all exercises about fifteen times, three times per day or as directed.  You should exercise both the  operative leg and the other leg as well.  Exercises include:   Quad Sets - Tighten up the muscle on the front of the thigh (Quad) and hold for 5-10 seconds.   Straight Leg Raises - With your knee straight (if you were given a brace, keep it on), lift the leg to 60 degrees, hold for 3 seconds, and slowly lower the leg.  Perform this exercise against resistance later as your leg gets stronger.  Leg Slides: Lying on your back, slowly slide your foot toward your buttocks, bending your knee up off the floor (only go as far as is comfortable). Then slowly slide your foot back down until your leg is flat on the floor again.  Angel Wings: Lying on your back spread your legs to the side as far apart as you can without causing discomfort.  Hamstring Strength:  Lying on your back, push your heel against the floor with your leg straight by tightening up the muscles of your buttocks.  Repeat, but this time bend your knee to a comfortable angle, and push your heel against the floor.  You may put a pillow under the heel to make it more comfortable if necessary.   A rehabilitation program following joint replacement surgery can speed recovery and prevent re-injury in the future due to weakened muscles. Contact your doctor or a physical therapist for more information on knee rehabilitation.   CONSTIPATION:  Constipation is defined medically as fewer than three stools per week and severe constipation as less than one stool per week.  Even if you have a regular bowel pattern at home, your normal regimen is likely to be disrupted due to multiple reasons following surgery.  Combination of anesthesia, postoperative narcotics, change in appetite and fluid intake all can affect your bowels.   YOU MUST use at least one of the following options; they are listed in order of increasing strength to get the job done.  They are all available over the counter, and you may need to use some, POSSIBLY even all of these options:     Drink plenty of fluids (prune juice may be helpful) and high fiber foods Colace 100 mg by mouth twice a day  Senokot for constipation as directed and as needed Dulcolax (bisacodyl ), take with full glass of water   Miralax (polyethylene glycol) once or twice a day as needed.  If you have tried all these things and are unable to have a bowel movement in the first 3-4 days after surgery call either your surgeon or your primary doctor.    If you experience loose stools or diarrhea, hold the medications until you stool forms back up.  If your symptoms do not get better within 1 week or if they get worse, check with your doctor.  If you experience the worst abdominal pain ever or develop nausea or vomiting, please contact the office immediately for further recommendations for treatment.  ITCHING:  If you experience itching with  your medications, try taking only a single pain pill, or even half a pain pill at a time.  You can also use Benadryl  over the counter for itching or also to help with sleep.   TED HOSE STOCKINGS:  Use stockings on both legs until for at least 2 weeks or as directed by physician office. They may be removed at night for sleeping.  MEDICATIONS:  See your medication summary on the "After Visit Summary" that nursing will review with you.  You may have some home medications which will be placed on hold until you complete the course of blood thinner medication.  It is important for you to complete the blood thinner medication as prescribed.  Blood clot prevention (DVT Prophylaxis): After surgery you are at an increased risk for a blood clot.  You were prescribed a blood thinner, Aspirin  81mg , to be taken twice daily for a total of 4 weeks from surgery to help reduce your risk of getting a blood clot.  Signs of a pulmonary embolus (blood clot in the lungs) include sudden short of breath, feeling lightheaded or dizzy, chest pain with a deep breath, rapid pulse rapid breathing.  Signs of  a blood clot in your arms or legs include new unexplained swelling and cramping, warm, red or darkened skin around the painful area.  Please call the office or 911 right away if these signs or symptoms develop.  PRECAUTIONS:   If you experience chest pain or shortness of breath - call 911 immediately for transfer to the hospital emergency department.   If you develop a fever greater that 101 F, purulent drainage from wound, increased redness or drainage from wound, foul odor from the wound/dressing, or calf pain - CONTACT YOUR SURGEON.                                                   FOLLOW-UP APPOINTMENTS:  If you do not already have a post-op appointment, please call the office for an appointment to be seen by your surgeon.  Guidelines for how soon to be seen are listed in your "After Visit Summary", but are typically between 2-3 weeks after surgery.  If you have a specialized bandage, you may be told to follow up 1 week after surgery.  POST-OPERATIVE OPIOID TAPER INSTRUCTIONS: It is important to wean off of your opioid medication as soon as possible. If you do not need pain medication after your surgery it is ok to stop day one. Opioids include: Codeine, Hydrocodone (Norco, Vicodin), Oxycodone (Percocet, oxycontin ) and hydromorphone  amongst others.  Long term and even short term use of opiods can cause: Increased pain response Dependence Constipation Depression Respiratory depression And more.  Withdrawal symptoms can include Flu like symptoms Nausea, vomiting And more Techniques to manage these symptoms Hydrate well Eat regular healthy meals Stay active Use relaxation techniques(deep breathing, meditating, yoga) Do Not substitute Alcohol to help with tapering If you have been on opioids for less than two weeks and do not have pain than it is ok to stop all together.  Plan to wean off of opioids This plan should start within one week post op of your joint replacement. Maintain the  same interval or time between taking each dose and first decrease the dose.  Cut the total daily intake of opioids by one tablet each day Next start to increase  the time between doses. The last dose that should be eliminated is the evening dose.   MAKE SURE YOU:  Understand these instructions.  Get help right away if you are not doing well or get worse.    Thank you for letting us  be a part of your medical care team.  It is a privilege we respect greatly.  We hope these instructions will help you stay on track for a fast and full recovery!

## 2024-01-09 NOTE — Progress Notes (Signed)
 Orthopedic Tech Progress Note Patient Details:  Jonathon Anderson 1955-10-01 [id number]  Ortho Devices Type of Ortho Device: Other (comment) Ortho Device/Splint Location: hip abduction pillow applied in pacu Ortho Device/Splint Interventions: Ordered, Application, Adjustment   Post Interventions Patient Tolerated: Well Instructions Provided: Adjustment of device, Care of device  [name] 01/09/2024, 5:32 PM

## 2024-01-09 NOTE — Anesthesia Preprocedure Evaluation (Signed)
 Anesthesia Evaluation  Patient identified by MRN, date of birth, ID band Patient awake    Reviewed: Allergy & Precautions, NPO status , Patient's Chart, lab work & pertinent test results  Airway Mallampati: II  TM Distance: >3 FB Neck ROM: Full    Dental  (+) Dental Advisory Given   Pulmonary former smoker   breath sounds clear to auscultation       Cardiovascular hypertension, Pt. on medications and Pt. on home beta blockers  Rhythm:Regular Rate:Normal     Neuro/Psych CVA    GI/Hepatic negative GI ROS, Neg liver ROS,,,  Endo/Other  negative endocrine ROS    Renal/GU negative Renal ROS     Musculoskeletal  (+) Arthritis ,    Abdominal   Peds  Hematology negative hematology ROS (+)   Anesthesia Other Findings   Reproductive/Obstetrics                             Anesthesia Physical Anesthesia Plan  ASA: 3  Anesthesia Plan: General   Post-op Pain Management: Ofirmev  IV (intra-op)*   Induction: Intravenous  PONV Risk Score and Plan: 2 and Dexamethasone , Ondansetron , Midazolam  and Treatment may vary due to age or medical condition  Airway Management Planned: Oral ETT  Additional Equipment: None  Intra-op Plan:   Post-operative Plan: Extubation in OR  Informed Consent: I have reviewed the patients History and Physical, chart, labs and discussed the procedure including the risks, benefits and alternatives for the proposed anesthesia with the patient or authorized representative who has indicated his/her understanding and acceptance.     Dental advisory given  Plan Discussed with: CRNA  Anesthesia Plan Comments:        Anesthesia Quick Evaluation

## 2024-01-09 NOTE — Anesthesia Procedure Notes (Signed)
 Procedure Name: Intubation Date/Time: 01/09/2024 11:40 AM  Performed by: Darlena Ego, CRNAPre-anesthesia Checklist: Patient identified, Emergency Drugs available, Suction available and Patient being monitored Patient Re-evaluated:Patient Re-evaluated prior to induction Oxygen Delivery Method: Circle System Utilized Preoxygenation: Pre-oxygenation with 100% oxygen Induction Type: IV induction Ventilation: Mask ventilation without difficulty Laryngoscope Size: Miller and 2 Grade View: Grade I Tube type: Oral Number of attempts: 1 Airway Equipment and Method: Stylet and Oral airway Placement Confirmation: ETT inserted through vocal cords under direct vision, positive ETCO2 and breath sounds checked- equal and bilateral Secured at: 22 cm Tube secured with: Tape Dental Injury: Teeth and Oropharynx as per pre-operative assessment

## 2024-01-09 NOTE — Interval H&P Note (Signed)
 The patient has been re-examined, and the chart reviewed, and there have been no interval changes to the documented history and physical.    Plan for left hip conversion to total hip arthroplasty for chronic nonunion and posttraumatic arthritis  The operative side was examined and the patient was confirmed to have sensation to DPN, SPN, TN intact, Motor EHL, ext, flex 5/5, and DP 2+, PT 2+, No significant edema.  Well-healed portal incisions.  The risks, benefits, and alternatives have been discussed at length with patient, and the patient is willing to proceed.  Left hip marked. Consent has been signed.

## 2024-01-10 LAB — BASIC METABOLIC PANEL WITH GFR
Anion gap: 9 (ref 5–15)
BUN: 7 mg/dL — ABNORMAL LOW (ref 8–23)
CO2: 26 mmol/L (ref 22–32)
Calcium: 8.2 mg/dL — ABNORMAL LOW (ref 8.9–10.3)
Chloride: 98 mmol/L (ref 98–111)
Creatinine, Ser: 0.74 mg/dL (ref 0.61–1.24)
GFR, Estimated: >60 mL/min (ref >60)
Glucose, Bld: 139 mg/dL — ABNORMAL HIGH (ref 70–99)
Potassium: 4.2 mmol/L (ref 3.5–5.1)
Sodium: 133 mmol/L — ABNORMAL LOW (ref 135–145)

## 2024-01-10 LAB — CBC
HCT: 25.3 % — ABNORMAL LOW (ref 39.0–52.0)
Hemoglobin: 8.2 g/dL — ABNORMAL LOW (ref 13.0–17.0)
MCH: 30.8 pg (ref 26.0–34.0)
MCHC: 32.4 g/dL (ref 30.0–36.0)
MCV: 95.1 fL (ref 80.0–100.0)
Platelets: 238 10*3/uL (ref 150–400)
RBC: 2.66 MIL/uL — ABNORMAL LOW (ref 4.22–5.81)
RDW: 13.4 % (ref 11.5–15.5)
WBC: 11.8 10*3/uL — ABNORMAL HIGH (ref 4.0–10.5)
nRBC: 0 % (ref 0.0–0.2)

## 2024-01-10 NOTE — Progress Notes (Signed)
     Subjective:  Patient reports pain as mild.  Lying comfortably in bed this morning.  Concerns overnight.  Abduction pillow in place denies distal tingling.  And for mobilization with therapy today.  Hemoglobin 8.2 today.  Objective:   VITALS:   Vitals:   01/09/24 1852 01/09/24 2110 01/10/24 0139 01/10/24 0555  BP: (!) 147/126 121/78 102/84 104/66  Pulse: 93 99 86 81  Resp: 16 18 18 18   Temp: 97.6 F (36.4 C) 97.7 F (36.5 C) 98.5 F (36.9 C) 97.8 F (36.6 C)  TempSrc:  Oral Oral Oral  SpO2: 95% 94% 97% 100%  Weight:      Height:        Sensation intact distally Intact pulses distally Dorsiflexion/Plantar flexion intact Incision: dressing C/D/I Compartment soft Wound VAC holding suction no drainage in the canister.  Lab Results  Component Value Date   WBC 11.8 (H) 01/10/2024   HGB 8.2 (L) 01/10/2024   HCT 25.3 (L) 01/10/2024   MCV 95.1 01/10/2024   PLT 238 01/10/2024   BMET    Component Value Date/Time   NA 133 (L) 01/10/2024 0323   K 4.2 01/10/2024 0323   CL 98 01/10/2024 0323   CO2 26 01/10/2024 0323   GLUCOSE 139 (H) 01/10/2024 0323   BUN 7 (L) 01/10/2024 0323   CREATININE 0.74 01/10/2024 0323   CALCIUM  8.2 (L) 01/10/2024 0323   GFRNONAA >60 01/10/2024 0323      Xray: Proximal femoral replacement and arthroplasty components in good alignment no adverse features  Assessment/Plan: 1 Day Post-Op   Principal Problem:   Closed fracture of left femur with nonunion  Status post left hip conversion to proximal femur replacement total hip arthroplasty for chronic subtrochanteric fracture nonunion 6/18/2  Post op recs: WB: WBAT LLE, 6 weeks of posterior precautions, hip abduction pillow when in bed Abx: ancef  in-house and discharged on cefadroxil, follow-up IntraOp cultures Imaging: PACU pelvis Xray Dressing: Prevena wound VAC DVT prophylaxis: Aspirin  81BID starting POD1 Follow up: 2 weeks after surgery for a wound check with Dr. Pryor Browning at  [facility].  Address: [address], [place], [place] [address]  Office Phone: [phone]    Jonathon Anderson 01/10/2024, 6:41 AM   Priscille Brought, MD  Contact information:   [phone] 7am-5pm epic message Dr. Pryor Browning, or call office for patient follow up: [phone] After hours and holidays please check [email or web address] for group call information for [facility]

## 2024-01-10 NOTE — Evaluation (Signed)
 Physical Therapy Evaluation Patient Details Name: Jonathon Anderson MRN: [id number] DOB: Jan 27, 1956 Today's Date: 01/10/2024  History of Present Illness  Pt s/p L THR 2* post-traumatic arthritis and intertrochanteric fx non-union.  Pt wtih hx of L hip fx 2024 and CVA with residual L hand numbness and tingling  Clinical Impression  Pt admitted as above and presenting with functional mobility limitations 2* decreased L LE strength/ROM, post op pain, obesity and pre-morbid deconditioning.  Pt should progress to dc home with family assist and HHPT follow up.        If plan is discharge home, recommend the following: A little help with walking and/or transfers;A little help with bathing/dressing/bathroom;Assistance with cooking/housework;Assist for transportation;Help with stairs or ramp for entrance   Can travel by private vehicle        Equipment Recommendations Rolling walker (2 wheels) (has been delivered to room)  Recommendations for Other Services       Functional Status Assessment Patient has had a recent decline in their functional status and demonstrates the ability to make significant improvements in function in a reasonable and predictable amount of time.     Precautions / Restrictions Precautions Precautions: Posterior Hip Precaution Booklet Issued: Yes (comment) Precaution/Restrictions Comments: Reviewed x 2 Restrictions Weight Bearing Restrictions Per Provider Order: Yes LLE Weight Bearing Per Provider Order: Weight bearing as tolerated      Mobility  Bed Mobility Overal bed mobility: Needs Assistance Bed Mobility: Supine to Sit     Supine to sit: Min assist, Mod assist, Used rails, HOB elevated     General bed mobility comments: cues for sequence, adherence to THP and use of R LE to self assist    Transfers Overall transfer level: Needs assistance Equipment used: Rolling walker (2 wheels) Transfers: Sit to/from Stand Sit to Stand: Min assist, From elevated  surface           General transfer comment: cues for LE management, use of UEs to self assist and adherence to THP    Ambulation/Gait Ambulation/Gait assistance: Min assist, Mod assist Gait Distance (Feet): 38 Feet Assistive device: Rolling walker (2 wheels) Gait Pattern/deviations: Step-to pattern, Decreased step length - right, Decreased step length - left, Shuffle, Trunk flexed Gait velocity: decr     General Gait Details: Increased time with cues for sequence, posture and position from RW - distance ltd by fatigue  Stairs            Wheelchair Mobility     Tilt Bed    Modified Rankin (Stroke Patients Only)       Balance Overall balance assessment: Needs assistance Sitting-balance support: Feet supported, No upper extremity supported Sitting balance-Leahy Scale: Good     Standing balance support: Bilateral upper extremity supported Standing balance-Leahy Scale: Poor                               Pertinent Vitals/Pain Pain Assessment Pain Assessment: 0-10 Pain Score: 5  Pain Location: L hip Pain Descriptors / Indicators: Aching, Sore Pain Intervention(s): Limited activity within patient's tolerance, Monitored during session, Premedicated before session, Ice applied    Home Living Family/patient expects to be discharged to:: Private residence Living Arrangements: Other relatives Available Help at Discharge: Family;Available 24 hours/day (sister) Type of Home: House Home Access: Stairs to enter Entrance Stairs-Rails: Right;Left;Can reach both Entrance Stairs-Number of Steps: 5   Home Layout: One level Home Equipment: Standard Walker Additional Comments: Pt lives  with younger sister    Prior Function Prior Level of Function : Independent/Modified Independent             Mobility Comments: ambulation with cane       Extremity/Trunk Assessment   Upper Extremity Assessment Upper Extremity Assessment: Overall WFL for tasks  assessed    Lower Extremity Assessment Lower Extremity Assessment: Overall WFL for tasks assessed    Cervical / Trunk Assessment Cervical / Trunk Assessment: Other exceptions Cervical / Trunk Exceptions: obese  Communication   Communication Communication: No apparent difficulties    Cognition Arousal: Alert Behavior During Therapy: WFL for tasks assessed/performed   PT - Cognitive impairments: No apparent impairments                         Following commands: Intact       Cueing Cueing Techniques: Verbal cues, Gestural cues     General Comments      Exercises Total Joint Exercises Ankle Circles/Pumps: AROM, Both, 15 reps, Supine Quad Sets: AROM, Both, 10 reps, Supine Heel Slides: AAROM, Left, 15 reps, Supine Hip ABduction/ADduction: AAROM, Left, 15 reps, Supine   Assessment/Plan    PT Assessment Patient needs continued PT services  PT Problem List Decreased strength;Decreased range of motion;Decreased activity tolerance;Decreased balance;Decreased mobility;Decreased knowledge of use of DME;Pain;Obesity       PT Treatment Interventions DME instruction;Gait training;Stair training;Functional mobility training;Therapeutic activities;Therapeutic exercise;Balance training;Patient/family education    PT Goals (Current goals can be found in the Care Plan section)  Acute Rehab PT Goals Patient Stated Goal: Regain IND PT Goal Formulation: With patient Time For Goal Achievement: 01/17/24 Potential to Achieve Goals: Fair    Frequency 7X/week     Co-evaluation               AM-PAC PT 6 Clicks Mobility  Outcome Measure Help needed turning from your back to your side while in a flat bed without using bedrails?: A Little Help needed moving from lying on your back to sitting on the side of a flat bed without using bedrails?: A Little Help needed moving to and from a bed to a chair (including a wheelchair)?: A Little Help needed standing up from a  chair using your arms (e.g., wheelchair or bedside chair)?: A Little Help needed to walk in hospital room?: A Lot Help needed climbing 3-5 steps with a railing? : A Lot 6 Click Score: 16    End of Session Equipment Utilized During Treatment: Gait belt Activity Tolerance: Patient limited by fatigue Patient left: in chair;with call bell/phone within reach;with chair alarm set Nurse Communication: Mobility status PT Visit Diagnosis: Difficulty in walking, not elsewhere classified (R26.2)    Time: 1610-9604 PT Time Calculation (min) (ACUTE ONLY): 47 min   Charges:   PT Evaluation $PT Eval Low Complexity: 1 Low PT Treatments $Gait Training: 8-22 mins $Therapeutic Exercise: 8-22 mins PT General Charges $$ ACUTE PT VISIT: 1 Visit         Thedora Finlay PT Acute Rehabilitation Services Pager [phone] Office [phone]   Bayan Kushnir 01/10/2024, 12:52 PM

## 2024-01-10 NOTE — Progress Notes (Signed)
 Physical Therapy Treatment Patient Details Name: Jonathon Anderson MRN: [id number] DOB: [birth year] Today's Date: 01/10/2024   History of Present Illness Pt s/p L THR 2* post-traumatic arthritis and intertrochanteric fx non-union.  Pt wtih hx of L hip fx 2024 and CVA with residual L hand numbness and tingling    PT Comments  Pt continues cooperative but requiring increased time for all tasks, progressing slowly with mobility and fatigues easily.  Pt up to ambulate limited distance in hall, assisted to bed and positioned for comfort.    If plan is discharge home, recommend the following: A little help with walking and/or transfers;A little help with bathing/dressing/bathroom;Assistance with cooking/housework;Assist for transportation;Help with stairs or ramp for entrance   Can travel by private vehicle        Equipment Recommendations  Rolling walker (2 wheels)    Recommendations for Other Services       Precautions / Restrictions Precautions Precautions: Posterior Hip Precaution Booklet Issued: Yes (comment) Precaution/Restrictions Comments: Reviewed x 2 Restrictions Weight Bearing Restrictions Per Provider Order: Yes LLE Weight Bearing Per Provider Order: Weight bearing as tolerated     Mobility  Bed Mobility Overal bed mobility: Needs Assistance Bed Mobility: Sit to Supine     Supine to sit: Min assist, Mod assist, Used rails, HOB elevated Sit to supine: Mod assist   General bed mobility comments: cues for sequence, adherence to THP and use of R LE to self assist    Transfers Overall transfer level: Needs assistance Equipment used: Rolling walker (2 wheels) Transfers: Sit to/from Stand Sit to Stand: Min assist, Mod assist           General transfer comment: cues for LE management, use of UEs to self assist and adherence to THP    Ambulation/Gait Ambulation/Gait assistance: Min assist Gait Distance (Feet): 26 Feet Assistive device: Rolling walker (2  wheels) Gait Pattern/deviations: Step-to pattern, Decreased step length - right, Decreased step length - left, Shuffle, Trunk flexed Gait velocity: decr     General Gait Details: Increased time with cues for sequence, posture and position from RW - distance ltd by fatigue   Stairs             Wheelchair Mobility     Tilt Bed    Modified Rankin (Stroke Patients Only)       Balance Overall balance assessment: Needs assistance Sitting-balance support: Feet supported, No upper extremity supported Sitting balance-Leahy Scale: Good     Standing balance support: Bilateral upper extremity supported Standing balance-Leahy Scale: Poor                              Communication Communication Communication: No apparent difficulties  Cognition Arousal: Alert Behavior During Therapy: WFL for tasks assessed/performed   PT - Cognitive impairments: No apparent impairments                         Following commands: Intact      Cueing Cueing Techniques: Verbal cues, Gestural cues  Exercises Total Joint Exercises Ankle Circles/Pumps: AROM, Both, 15 reps, Supine Quad Sets: AROM, Both, 10 reps, Supine Heel Slides: AAROM, Left, 15 reps, Supine Hip ABduction/ADduction: AAROM, Left, 15 reps, Supine    General Comments        Pertinent Vitals/Pain Pain Assessment Pain Assessment: 0-10 Pain Score: 5  Pain Location: L hip Pain Descriptors / Indicators: Aching, Sore Pain Intervention(s): Limited activity within  patient's tolerance, Monitored during session, Premedicated before session    Home Living Family/patient expects to be discharged to:: Private residence Living Arrangements: Other relatives Available Help at Discharge: Family;Available 24 hours/day (sister) Type of Home: House Home Access: Stairs to enter Entrance Stairs-Rails: Right;Left;Can reach both Entrance Stairs-Number of Steps: 5   Home Layout: One level Home Equipment: Systems developer Additional Comments: Pt lives with younger sister    Prior Function            PT Goals (current goals can now be found in the care plan section) Acute Rehab PT Goals Patient Stated Goal: Regain IND PT Goal Formulation: With patient Time For Goal Achievement: 01/17/24 Potential to Achieve Goals: Fair Progress towards PT goals: Progressing toward goals    Frequency    7X/week      PT Plan      Co-evaluation              AM-PAC PT 6 Clicks Mobility   Outcome Measure  Help needed turning from your back to your side while in a flat bed without using bedrails?: A Little Help needed moving from lying on your back to sitting on the side of a flat bed without using bedrails?: A Little Help needed moving to and from a bed to a chair (including a wheelchair)?: A Little Help needed standing up from a chair using your arms (e.g., wheelchair or bedside chair)?: A Little Help needed to walk in hospital room?: A Little Help needed climbing 3-5 steps with a railing? : A Lot 6 Click Score: 17    End of Session Equipment Utilized During Treatment: Gait belt Activity Tolerance: Patient tolerated treatment well;Patient limited by fatigue Patient left: in bed;with call bell/phone within reach;with bed alarm set;with nursing/sitter in room Nurse Communication: Mobility status PT Visit Diagnosis: Difficulty in walking, not elsewhere classified (R26.2)     Time: 1426-1450 PT Time Calculation (min) (ACUTE ONLY): 24 min  Charges:    $Gait Training: 8-22 mins $Therapeutic Exercise: 8-22 mins $Therapeutic Activity: 8-22 mins PT General Charges $$ ACUTE PT VISIT: 1 Visit                     Thedora Finlay PT Acute Rehabilitation Services Pager [phone] Office [phone]    Jonathon Anderson 01/10/2024, 3:41 PM

## 2024-01-10 NOTE — TOC Transition Note (Signed)
 Transition of Care [facility]) - Discharge Note   Patient Details  Name: Jonathon Anderson MRN: [id number] Date of Birth: 08/20/1955  [facility] of [facility] CM/SW Contact:  Delilah Fend, LCSW Phone Number: 01/10/2024, 10:29 AM   Clinical Narrative:     Met with pt and confirming he has received RW to room via [facility].  HHPT prearranged with [facility] per ortho MD office prior to surgery.  No further TOC needs.  Final next level of care: Home w Home Health Services Barriers to Discharge: No Barriers Identified   Patient Goals and CMS Choice Patient states their goals for this hospitalization and ongoing recovery are:: return home          Discharge Placement                       Discharge Plan and Services Additional resources added to the After Visit Summary for                  DME Arranged: Walker rolling DME Agency: [employer]       HH Arranged: PT HH Agency: [facility]        Social Drivers of Health (SDOH) Interventions SDOH Screenings   Food Insecurity: No Food Insecurity (01/09/2024)  Housing: Low Risk  (01/09/2024)  Transportation Needs: No Transportation Needs (01/09/2024)  Utilities: Not At Risk (01/09/2024)  Social Connections: Moderately Integrated (01/09/2024)  Tobacco Use: Medium Risk (01/09/2024)     Readmission Risk Interventions     No data to display

## 2024-01-11 ENCOUNTER — Encounter (HOSPITAL_COMMUNITY): Payer: Self-pay | Admitting: Orthopedic Surgery

## 2024-01-11 LAB — BPAM RBC
ISSUE DATE / TIME: 202506190843
Unit Type and Rh: 5100
Unit Type and Rh: 5100
Unit Type and Rh: 5100

## 2024-01-11 LAB — CBC
HCT: 23.6 % — ABNORMAL LOW (ref 39.0–52.0)
Hemoglobin: 7.5 g/dL — ABNORMAL LOW (ref 13.0–17.0)
MCH: 30.6 pg (ref 26.0–34.0)
MCHC: 31.8 g/dL (ref 30.0–36.0)
MCV: 96.3 fL (ref 80.0–100.0)
Platelets: 225 10*3/uL (ref 150–400)
RBC: 2.45 MIL/uL — ABNORMAL LOW (ref 4.22–5.81)
RDW: 13.3 % (ref 11.5–15.5)
WBC: 12.7 10*3/uL — ABNORMAL HIGH (ref 4.0–10.5)
nRBC: 0 % (ref 0.0–0.2)

## 2024-01-11 LAB — TYPE AND SCREEN
Unit division: 0
Unit division: 0
Unit division: 0
Unit division: 0

## 2024-01-11 LAB — PREPARE RBC (CROSSMATCH)

## 2024-01-11 LAB — AEROBIC/ANAEROBIC CULTURE W GRAM STAIN (SURGICAL/DEEP WOUND): Gram Stain: NONE SEEN

## 2024-01-11 MED ORDER — SODIUM CHLORIDE 0.9% IV SOLUTION: Freq: Once | INTRAVENOUS | Status: AC

## 2024-01-11 NOTE — Evaluation (Signed)
 Occupational Therapy Evaluation Patient Details Name: Jonathon Anderson MRN: [id number] DOB: January 24, 1956 Today's Date: 01/11/2024   History of Present Illness   Pt s/p L THA on 01-09-2024  due to post-traumatic arthritis and intertrochanteric fracture non-union.  Pt wtih hx of L hip fx 2024 and CVA with residual L hand numbness and tingling     Clinical Impressions The pt is currently presenting below his baseline level of functioning for self-care management, as he is limited by the below listed deficits (see OT problem list). During the session today, he required mod assist to transfer from supine to sit, max assist for lower body dressing seated EOB, and mod assist for sit to supine. He reported having 6/10 L hip pain. He required increased time and effort for progressive mobility/activity. OT provided instruction & education on his hip precautions, safe ADL participation, equipment recommendations for self care management, and compensatory strategies for ADL completion (see ADL section below). He will benefit from further OT services in the hospital setting to maximize his independence with self-care tasks.      If plan is discharge home, recommend the following:   Help with stairs or ramp for entrance;Assistance with cooking/housework;Assist for transportation;A little help with walking and/or transfers;A lot of help with bathing/dressing/bathroom     Functional Status Assessment   Patient has had a recent decline in their functional status and demonstrates the ability to make significant improvements in function in a reasonable and predictable amount of time.     Equipment Recommendations   None recommended by OT     Recommendations for Other Services         Precautions/Restrictions   Precautions Precautions: Posterior Hip Precaution Booklet Issued: Yes (comment) Precaution/Restrictions Comments:  (wound vac) Restrictions Weight Bearing Restrictions Per Provider  Order: Yes LLE Weight Bearing Per Provider Order: Weight bearing as tolerated     Mobility Bed Mobility Overal bed mobility: Needs Assistance Bed Mobility: Supine to Sit     Supine to sit: Mod assist, HOB elevated, Used rails Sit to supine: Mod assist (required assist for B LE back onto bed)              Balance     Sitting balance-Leahy Scale: Good       ADL either performed or assessed with clinical judgement   ADL Overall ADL's : Needs assistance/impaired Eating/Feeding: Independent;Sitting   Grooming: Set up;Sitting         Lower Body Bathing Details (indicate cue type and reason): OT educated the pt on use of a shower seat and long-handled sponge for addeded safety & adherence to hip precautions during bathing tasks. Upper Body Dressing : Set up;Sitting   Lower Body Dressing: Maximal assistance;With adaptive equipment;Sitting/lateral leans Lower Body Dressing Details (indicate cue type and reason): OT instructed the pt on use of AE for lower body dressing tasks, given posterior hip precautions. Specifically, OT instructed him on use of a reacher to doff socks, sock aid to donn socks, and reacher to donn lower body clothing articles, such as shorts/pants. He performed teach back on use of reacher to doff socks and sock aid to donn socks while seated EOB. He stated he does not plan to wear socks once he returns home and his sister can help him with dressing as needed.   Toilet Transfer Details (indicate cue type and reason): OT educated him on recommendations for use of a raised toilet, to facilitate improved ease with toilet transfers post-op, as well as adherence  to posterior hip precautions. He stated his sister has a raised toilet at home, however he can also place a bedside commode frame over his toilet at home with needed.                 Vision Baseline Vision/History: 1 Wears glasses              Pertinent Vitals/Pain Pain Assessment Pain  Assessment: 0-10 Pain Score: 6  Pain Location: L hip Pain Intervention(s): Limited activity within patient's tolerance, Monitored during session, Repositioned     Extremity/Trunk Assessment Upper Extremity Assessment Upper Extremity Assessment: Overall WFL for tasks assessed;Right hand dominant   Lower Extremity Assessment Lower Extremity Assessment: LLE deficits/detail LLE Deficits / Details: L hip pain and stiffness reported since surgery       Communication Communication Communication: No apparent difficulties   Cognition Arousal: Alert Behavior During Therapy: WFL for tasks assessed/performed               OT - Cognition Comments: Oriented x4                 Following commands: Intact                  Home Living Family/patient expects to be discharged to:: Private residence Living Arrangements: Other relatives (He lives alone, however plans to stay with his sister upon hospital discharge. Household layout is based on his sister's home.) Available Help at Discharge: Family;Available 24 hours/day Type of Home: Mobile home Home Access: Stairs to enter Entrance Stairs-Number of Steps: 6 Entrance Stairs-Rails: Right;Left Home Layout: One level     Bathroom Shower/Tub: Walk-in shower         Home Equipment: Control and instrumentation engineer (2 wheels);Cane - single point;Other (comment) (Reacher)          Prior Functioning/Environment Prior Level of Function : Independent/Modified Independent;Driving             Mobility Comments: Occasional use of a cane. ADLs Comments: He was independent with ADLs and driving.    OT Problem List: Decreased strength;Impaired balance (sitting and/or standing);Decreased knowledge of use of DME or AE;Pain   OT Treatment/Interventions: Self-care/ADL training;Therapeutic exercise;Therapeutic activities;Energy conservation;Patient/family education;DME and/or AE instruction;Balance training      OT  Goals(Current goals can be found in the care plan section)   Acute Rehab OT Goals OT Goal Formulation: With patient Time For Goal Achievement: 01/25/24 Potential to Achieve Goals: Good ADL Goals Pt Will Perform Lower Body Dressing: with supervision;sitting/lateral leans;with adaptive equipment;sit to/from stand Pt Will Transfer to Toilet: with supervision;ambulating Pt Will Perform Toileting - Clothing Manipulation and hygiene: with supervision;sit to/from stand Additional ADL Goal #1: Pt will perform bed mobility with supervision in prep for progressive ADL participation.   OT Frequency:  Min 2X/week       AM-PAC OT 6 Clicks Daily Activity     Outcome Measure Help from another person eating meals?: None Help from another person taking care of personal grooming?: A Little Help from another person toileting, which includes using toliet, bedpan, or urinal?: A Lot Help from another person bathing (including washing, rinsing, drying)?: A Lot Help from another person to put on and taking off regular upper body clothing?: A Little Help from another person to put on and taking off regular lower body clothing?: A Lot 6 Click Score: 16   End of Session Equipment Utilized During Treatment: Other (comment) (abduction pillow in bed) Nurse Communication: Mobility status  Activity Tolerance:  Patient tolerated treatment well Patient left: in bed;with call bell/phone within reach;with bed alarm set  OT Visit Diagnosis: Muscle weakness (generalized) (M62.81);Pain;Other abnormalities of gait and mobility (R26.89) Pain - Right/Left: Left Pain - part of body: Hip                Time: 8657-8469 OT Time Calculation (min): 33 min Charges:  OT General Charges $OT Visit: 1 Visit OT Evaluation $OT Eval Moderate Complexity: 1 Mod OT Treatments $Self Care/Home Management : 8-22 mins    Sheralyn Dies, OTR/L 01/11/2024, 5:14 PM

## 2024-01-11 NOTE — Progress Notes (Signed)
     Subjective: Jonathon Anderson is doing very well overall.  Mobilized well with physical therapy 25 feet.  Hemoglobin still dropped down trending slightly down to 7.5 today.  Discussed plan for 1 unit transfusion today with recheck tomorrow.  Hopeful for discharge home tomorrow if no additional concerns.  Objective:   VITALS:   Vitals:   01/10/24 0934 01/10/24 1245 01/10/24 2043 01/11/24 0555  BP: 114/76 112/68 123/65 110/66  Pulse: 92 89 92 85  Resp: 18 18 18 18   Temp: 98.4 F (36.9 C) 99 F (37.2 C) 98.2 F (36.8 C) 98.5 F (36.9 C)  TempSrc: Oral  Oral Oral  SpO2: 94% 94% 100% 93%  Weight:      Height:        Sensation intact distally Intact pulses distally Dorsiflexion/Plantar flexion intact Incision: dressing C/D/I Compartment soft Wound VAC holding suction no drainage in the canister.  Lab Results  Component Value Date   WBC 12.7 (H) 01/11/2024   HGB 7.5 (L) 01/11/2024   HCT 23.6 (L) 01/11/2024   MCV 96.3 01/11/2024   PLT 225 01/11/2024   BMET    Component Value Date/Time   NA 133 (L) 01/10/2024 0323   K 4.2 01/10/2024 0323   CL 98 01/10/2024 0323   CO2 26 01/10/2024 0323   GLUCOSE 139 (H) 01/10/2024 0323   BUN 7 (L) 01/10/2024 0323   CREATININE 0.74 01/10/2024 0323   CALCIUM  8.2 (L) 01/10/2024 0323   GFRNONAA >60 01/10/2024 0323      Xray: Proximal femoral replacement and arthroplasty components in good alignment no adverse features  Assessment/Plan: 2 Days Post-Op   Principal Problem:   Closed fracture of left femur with nonunion  Status post left hip conversion to proximal femur replacement total hip arthroplasty for chronic subtrochanteric fracture nonunion 01/09/24  Acute blood loss anemia 12.4 down to 7.5: Will transfuse 1 unit today, recheck tomorrow.  Post op recs: WB: WBAT LLE, 6 weeks of posterior precautions, hip abduction pillow when in bed Abx: ancef  in-house and discharged on cefadroxil, follow-up IntraOp cultures:  NGTD Imaging: PACU pelvis Xray Dressing: Prevena wound VAC DVT prophylaxis: Aspirin  81BID starting POD1 Follow up: Scheduled for 6/26 for wound VAC removal and wound check Address: [address], [place], [place] [address]  Office Phone: [phone]    Nathanyel Defenbaugh A Uri Turnbough 01/11/2024, 6:56 AM   Priscille Brought, MD  Contact information:   [phone] 7am-5pm epic message Dr. Pryor Browning, or call office for patient follow up: [phone] After hours and holidays please check [email or web address] for group call information for [facility]

## 2024-01-11 NOTE — Progress Notes (Signed)
 Physical Therapy Treatment Patient Details Name: Jonathon Anderson MRN: [id number] DOB: May 14, 1956 Today's Date: 01/11/2024   History of Present Illness Pt s/p L THR 2* post-traumatic arthritis and intertrochanteric fx non-union.  Pt wtih hx of L hip fx 2024 and CVA with residual L hand numbness and tingling    PT Comments  POD # 2 am session withheld due to blood transfusion POD # 2 pm session Assisted to EOB to use urinal.  General bed mobility comments: cues for sequence, adherence to THP and use of R LE to self assist plus increased time   Assisted with amb.  General transfer comment: MAX VC's to adhere to THP esp to avoid hip flexion >90 degrees during sit to stand and esp stand to sit. General Gait Details: Required + 2 assist such that recliner was following.   Distance limited by fatigue.  Pain avg 7/10 with amb. Returned to room and positioned to comfort.  Max c/o fatigue.  Pt has NOT yet met his mobility goals.      If plan is discharge home, recommend the following: A little help with walking and/or transfers;A little help with bathing/dressing/bathroom;Assistance with cooking/housework;Assist for transportation;Help with stairs or ramp for entrance   Can travel by private vehicle        Equipment Recommendations  Rolling walker (2 wheels)    Recommendations for Other Services       Precautions / Restrictions Precautions Precautions: Posterior Hip Precaution/Restrictions Comments: VAC, requires MAX VC's on his THP Restrictions Weight Bearing Restrictions Per Provider Order: No LLE Weight Bearing Per Provider Order: Weight bearing as tolerated     Mobility  Bed Mobility Overal bed mobility: Needs Assistance Bed Mobility: Supine to Sit     Supine to sit: Min assist, Mod assist, Used rails, HOB elevated     General bed mobility comments: cues for sequence, adherence to THP and use of R LE to self assist plus increased time    Transfers Overall transfer level:  Needs assistance Equipment used: Rolling walker (2 wheels) Transfers: Sit to/from Stand Sit to Stand: Min assist, Mod assist           General transfer comment: MAX VC's to adhere to THP esp to avoid hip flexion >90 degrees during sit to stand and esp stand to sit.    Ambulation/Gait Ambulation/Gait assistance: Min assist, +2 safety/equipment Gait Distance (Feet): 24 Feet Assistive device: Rolling walker (2 wheels) Gait Pattern/deviations: Step-to pattern, Decreased step length - right, Decreased step length - left, Shuffle, Trunk flexed Gait velocity: decreased     General Gait Details: Required + 2 assist such that recliner was following.   Distance limited by fatigue.  Pain avg 7/10 with amb.   Stairs             Wheelchair Mobility     Tilt Bed    Modified Rankin (Stroke Patients Only)       Balance                                            Communication Communication Communication: No apparent difficulties  Cognition Arousal: Alert Behavior During Therapy: WFL for tasks assessed/performed   PT - Cognitive impairments: No apparent impairments                       PT - Cognition Comments: AxO  x 3 pleasant and willing.  Never married and lives home alone. Following commands: Intact      Cueing Cueing Techniques: Verbal cues, Gestural cues  Exercises      General Comments        Pertinent Vitals/Pain Pain Assessment Pain Assessment: 0-10 Pain Score: 7  Pain Location: L hip Pain Descriptors / Indicators: Aching, Sore, Operative site guarding Pain Intervention(s): Monitored during session, Repositioned, Premedicated before session    Home Living                          Prior Function            PT Goals (current goals can now be found in the care plan section) Progress towards PT goals: Progressing toward goals    Frequency           PT Plan      Co-evaluation               AM-PAC PT 6 Clicks Mobility   Outcome Measure  Help needed turning from your back to your side while in a flat bed without using bedrails?: A Little Help needed moving from lying on your back to sitting on the side of a flat bed without using bedrails?: A Little Help needed moving to and from a bed to a chair (including a wheelchair)?: A Little Help needed standing up from a chair using your arms (e.g., wheelchair or bedside chair)?: A Little Help needed to walk in hospital room?: A Little Help needed climbing 3-5 steps with a railing? : A Lot 6 Click Score: 17    End of Session Equipment Utilized During Treatment: Gait belt   Patient left: in chair;with call bell/phone within reach Nurse Communication: Mobility status PT Visit Diagnosis: Difficulty in walking, not elsewhere classified (R26.2)     Time: 1308-6578 PT Time Calculation (min) (ACUTE ONLY): 25 min  Charges:    $Gait Training: 8-22 mins $Therapeutic Activity: 8-22 mins PT General Charges $$ ACUTE PT VISIT: 1 Visit                     Bess Broody  PTA Acute  Rehabilitation Services Office M-F          [phone]

## 2024-01-11 NOTE — Plan of Care (Signed)
  Problem: Health Behavior/Discharge Planning: Goal: Ability to manage health-related needs will improve Outcome: Progressing   Problem: Clinical Measurements: Goal: Ability to maintain clinical measurements within normal limits will improve Outcome: Progressing   Problem: Activity: Goal: Risk for activity intolerance will decrease Outcome: Progressing   Problem: Coping: Goal: Level of anxiety will decrease Outcome: Progressing   Problem: Pain Managment: Goal: General experience of comfort will improve and/or be controlled Outcome: Progressing

## 2024-01-12 LAB — CBC
HCT: 26.5 % — ABNORMAL LOW (ref 39.0–52.0)
Hemoglobin: 8.6 g/dL — ABNORMAL LOW (ref 13.0–17.0)
MCH: 30.4 pg (ref 26.0–34.0)
MCHC: 32.5 g/dL (ref 30.0–36.0)
MCV: 93.6 fL (ref 80.0–100.0)
Platelets: 221 10*3/uL (ref 150–400)
RBC: 2.83 MIL/uL — ABNORMAL LOW (ref 4.22–5.81)
RDW: 14.8 % (ref 11.5–15.5)
WBC: 11.2 10*3/uL — ABNORMAL HIGH (ref 4.0–10.5)
nRBC: 0 % (ref 0.0–0.2)

## 2024-01-12 LAB — BASIC METABOLIC PANEL WITH GFR
Anion gap: 11 (ref 5–15)
BUN: 7 mg/dL — ABNORMAL LOW (ref 8–23)
CO2: 26 mmol/L (ref 22–32)
Calcium: 7.7 mg/dL — ABNORMAL LOW (ref 8.9–10.3)
Chloride: 97 mmol/L — ABNORMAL LOW (ref 98–111)
Creatinine, Ser: 0.62 mg/dL (ref 0.61–1.24)
GFR, Estimated: >60 mL/min (ref >60)
Glucose, Bld: 117 mg/dL — ABNORMAL HIGH (ref 70–99)
Potassium: 3.3 mmol/L — ABNORMAL LOW (ref 3.5–5.1)
Sodium: 134 mmol/L — ABNORMAL LOW (ref 135–145)

## 2024-01-12 NOTE — Progress Notes (Signed)
 Orthopaedic Trauma Service (OTS)  3 Days Post-Op Procedure(s) (LRB): CONVERSION TO TOTAL HIP (Left) APPLICATION, WOUND VAC (Left)  Subjective: Patient reports pain as mild.    Objective: Current Vitals Blood pressure 130/63, pulse (!) 106, temperature 98.5 F (36.9 C), temperature source Oral, resp. rate 15, height 5' 8 (1.727 m), weight 101.6 kg, SpO2 96%. Vital signs in last 24 hours: Temp:  [98.1 F (36.7 C)-98.5 F (36.9 C)] 98.5 F (36.9 C) (06/21 1047) Pulse Rate:  [103-106] 106 (06/21 1047) Resp:  [15-16] 15 (06/21 1047) BP: (130-133)/(63-83) 130/63 (06/21 1047) SpO2:  [91 %-96 %] 96 % (06/21 1047)  Intake/Output from previous day: 06/20 0701 - 06/21 0700 In: 2023.8 [P.O.:960; I.V.:436.5; Blood:324; IV Piggyback:303.3] Out: 800 [Urine:800]  LABS Recent Labs    01/09/24 1751 01/10/24 0323 01/11/24 0316 01/12/24 0344  HGB 8.8* 8.2* 7.5* 8.6*   Recent Labs    01/11/24 0316 01/12/24 0344  WBC 12.7* 11.2*  RBC 2.45* 2.83*  HCT 23.6* 26.5*  PLT 225 221   Recent Labs    01/10/24 0323 01/12/24 0344  NA 133* 134*  K 4.2 3.3*  CL 98 97*  CO2 26 26  BUN 7* 7*  CREATININE 0.74 0.62  GLUCOSE 139* 117*  CALCIUM  8.2* 7.7*   No results for input(s): LABPT, INR in the last 72 hours.   Physical Exam LLE Vac dressing setting altered to address leak and seal holding well and appears in excellent shape Dressing intact, clean, dry  Edema/ swelling moderate but to expectation  Sens: DPN, SPN, TN intact  Motor: EHL, FHL, and lessor toe ext and flex all intact grossly  Brisk cap refill, warm to touch  Assessment/Plan: 3 Days Post-Op Procedure(s) (LRB): CONVERSION TO TOTAL HIP (Left) APPLICATION, WOUND VAC (Left) 1. PT/OT  2. DVT proph baby ASA 3. D/c planning for Monday  Ozell Bruch, MD [facility], [facility] [phone]

## 2024-01-12 NOTE — Progress Notes (Signed)
 Occupational Therapy Treatment Patient Details Name: Jonathon Anderson MRN: [id number] DOB: January 28, 1956 Today's Date: 01/12/2024   History of present illness Pt s/p L THR 2* post-traumatic arthritis and intertrochanteric fx non-union.  Pt with hx of L hip fx 2024 and CVA with residual L hand numbness and tingling   OT comments  Patient was able to engage in standing balance challenges to simulate toileting hygiene and clothing management to increase independence in toileting tasks while maintaining total hip precautions. Patient noted to have spillage with urinal usage in sitting with education on importance of hygiene after this with patient provided with washcloths to clean off v.s. tissue from box on table. Patient's discharge plan remains appropriate at this time. OT will continue to follow acutely.        If plan is discharge home, recommend the following:  Help with stairs or ramp for entrance;Assistance with cooking/housework;Assist for transportation;A little help with walking and/or transfers;A lot of help with bathing/dressing/bathroom   Equipment Recommendations  None recommended by OT       Precautions / Restrictions Precautions Precautions: Posterior Hip Precaution/Restrictions Comments: wound vac Restrictions Weight Bearing Restrictions Per Provider Order: Yes LLE Weight Bearing Per Provider Order: Weight bearing as tolerated       Mobility Bed Mobility               General bed mobility comments: patient was up in recliner and remained in the same.           Balance Overall balance assessment: Needs assistance Sitting-balance support: Feet supported, No upper extremity supported Sitting balance-Leahy Scale: Good     Standing balance support: Single extremity supported, During functional activity Standing balance-Leahy Scale: Fair         ADL either performed or assessed with clinical judgement   ADL Overall ADL's : Needs assistance/impaired                        Lower Body Dressing Details (indicate cue type and reason): patient continued to report that he had all AE at home for LB Dressing/bathing with no need for refresher today.     Toileting- Clothing Manipulation and Hygiene: Supervision/safety;Set up;Sit to/from stand Toileting - Clothing Manipulation Details (indicate cue type and reason): with education to only take one hand off at a time. bring reacher to bathroom to collect pants off floor if they drop while standing or while seated to be able to maintain precautions. patient verbalized understanding.              Cognition Arousal: Alert Behavior During Therapy: WFL for tasks assessed/performed Cognition: No apparent impairments                               Following commands: Intact                      Pertinent Vitals/ Pain       Pain Assessment Pain Assessment: Faces Faces Pain Scale: Hurts little more Pain Location: L hip Pain Descriptors / Indicators: Constant, Discomfort, Grimacing Pain Intervention(s): Limited activity within patient's tolerance, Monitored during session, Premedicated before session, Ice applied         Frequency  Min 2X/week        Progress Toward Goals  OT Goals(current goals can now be found in the care plan section)  Progress towards OT goals: Progressing toward goals  Plan         AM-PAC OT 6 Clicks Daily Activity     Outcome Measure   Help from another person eating meals?: None Help from another person taking care of personal grooming?: A Little Help from another person toileting, which includes using toliet, bedpan, or urinal?: A Lot Help from another person bathing (including washing, rinsing, drying)?: A Lot Help from another person to put on and taking off regular upper body clothing?: A Little Help from another person to put on and taking off regular lower body clothing?: A Lot 6 Click Score: 16    End of Session  Equipment Utilized During Treatment: Gait belt;Rolling walker (2 wheels)  OT Visit Diagnosis: Muscle weakness (generalized) (M62.81);Pain;Other abnormalities of gait and mobility (R26.89) Pain - Right/Left: Left Pain - part of body: Hip   Activity Tolerance Patient tolerated treatment well   Patient Left in chair;with call bell/phone within reach   Nurse Communication          Time: 8884-8865 OT Time Calculation (min): 19 min  Charges: OT General Charges $OT Visit: 1 Visit OT Treatments $Self Care/Home Management : 8-22 mins  Geofm LEYLAND, MS Acute Rehabilitation Department Office# [phone]   Geofm CHRISTELLA Dance 01/12/2024, 1:42 PM

## 2024-01-12 NOTE — Plan of Care (Signed)
  Problem: Clinical Measurements: Goal: Ability to maintain clinical measurements within normal limits will improve Outcome: Not Progressing Goal: Respiratory complications will improve Outcome: Not Progressing   Problem: Activity: Goal: Risk for activity intolerance will decrease Outcome: Not Progressing   Problem: Nutrition: Goal: Adequate nutrition will be maintained Outcome: Not Progressing

## 2024-01-12 NOTE — Progress Notes (Addendum)
 PHYSICAL THERAPY PM session  Pt back in bed requesting to rest.  Pt had one PT session and one OT session today.  Per MD, plans to D/C to home Monday  Katheryn Leap  PTA Acute  Rehabilitation Services Office M-F          [phone]

## 2024-01-12 NOTE — Progress Notes (Addendum)
 Physical Therapy Treatment Patient Details Name: ARTIE MCINTYRE MRN: [id number] DOB: [birth year] Today's Date: 01/12/2024   History of Present Illness Pt s/p L THR 2* post-traumatic arthritis and intertrochanteric fx non-union.  Pt with hx of L hip fx 2024 and CVA with residual L hand numbness and tingling    PT Comments  POD # 3 PT - Cognition Comments: AxO x 3 pleasant and willing.  Lives home with his sister. Noted increased ABD bloating/distention with increased difficulty performing supine to sit due ABD girth.  Pt reports he has not had a BM since Wed.  Reported to RN. Assisted OOB was difficult.  General bed mobility comments: Required increased asisst for upper body due to increased ABD size and c/o feeling bloated. Assisted with amb was limited.  General transfer comment: MAX VC's to adhere to THP esp to avoid hip flexion >90 degrees during sit to stand and esp stand to sit. General Gait Details: Required + 2 assist such that recliner was following.   Distance limited by fatigue and increased dyspnea. RA avg 94%.  Pt deconditioned and active tobacco Then returned to room to perform some TE's.  Instructed on proper tech, freq as well as use of ICE.   Pt plans to return home when medically cleared.       If plan is discharge home, recommend the following: A little help with walking and/or transfers;A little help with bathing/dressing/bathroom;Assistance with cooking/housework;Assist for transportation;Help with stairs or ramp for entrance   Can travel by private vehicle        Equipment Recommendations  Rolling walker (2 wheels)  Delivered in room  Recommendations for Other Services       Precautions / Restrictions Precautions Precautions: Posterior Hip Precaution/Restrictions Comments: wound vac Restrictions Weight Bearing Restrictions Per Provider Order: No LLE Weight Bearing Per Provider Order: Weight bearing as tolerated     Mobility  Bed Mobility Overal bed  mobility: Needs Assistance Bed Mobility: Supine to Sit     Supine to sit: Mod assist, Max assist     General bed mobility comments: Required increased asisst for upper body due to increased ABD size and c/o feeling bloated.    Transfers Overall transfer level: Needs assistance Equipment used: Rolling walker (2 wheels) Transfers: Sit to/from Stand Sit to Stand: Min assist, Mod assist           General transfer comment: MAX VC's to adhere to THP esp to avoid hip flexion >90 degrees during sit to stand and esp stand to sit.    Ambulation/Gait Ambulation/Gait assistance: Min assist, +2 safety/equipment Gait Distance (Feet): 14 Feet Assistive device: Rolling walker (2 wheels) Gait Pattern/deviations: Step-to pattern, Decreased step length - right, Decreased step length - left, Shuffle, Trunk flexed Gait velocity: decreased     General Gait Details: Required + 2 assist such that recliner was following.   Distance limited by fatigue and increased dyspnea.   Stairs             Wheelchair Mobility     Tilt Bed    Modified Rankin (Stroke Patients Only)       Balance                                            Communication Communication Communication: No apparent difficulties  Cognition Arousal: Alert Behavior During Therapy: WFL for tasks assessed/performed  PT - Cognitive impairments: No apparent impairments                       PT - Cognition Comments: AxO x 3 pleasant and willing.  Noted increased ABD bloating/distention with increased difficulty performing supine to sit due ABD girth.  Pt reports he has not had a BM since Wed.  Reported to RN. Following commands: Intact      Cueing Cueing Techniques: Verbal cues, Gestural cues  Exercises  10 reps AP 10 reps knee presses 10 reps AAROM HS     General Comments        Pertinent Vitals/Pain Pain Assessment Pain Assessment: 0-10 Pain Score: 4  Pain Location: L  hip Pain Descriptors / Indicators: Constant, Discomfort, Grimacing Pain Intervention(s): Monitored during session, Repositioned, Ice applied, Premedicated before session    Home Living                          Prior Function            PT Goals (current goals can now be found in the care plan section) Progress towards PT goals: Progressing toward goals    Frequency    7X/week      PT Plan      Co-evaluation              AM-PAC PT 6 Clicks Mobility   Outcome Measure  Help needed turning from your back to your side while in a flat bed without using bedrails?: A Little Help needed moving from lying on your back to sitting on the side of a flat bed without using bedrails?: A Little Help needed moving to and from a bed to a chair (including a wheelchair)?: A Little Help needed standing up from a chair using your arms (e.g., wheelchair or bedside chair)?: A Little Help needed to walk in hospital room?: A Little Help needed climbing 3-5 steps with a railing? : A Little 6 Click Score: 18    End of Session Equipment Utilized During Treatment: Gait belt Activity Tolerance: Patient tolerated treatment well;Patient limited by fatigue Patient left: in chair;with call bell/phone within reach Nurse Communication: Mobility status PT Visit Diagnosis: Difficulty in walking, not elsewhere classified (R26.2)     Time: 1000-1028 PT Time Calculation (min) (ACUTE ONLY): 28 min  Charges:    $Gait Training: 8-22 mins $Therapeutic Activity: 8-22 mins PT General Charges $$ ACUTE PT VISIT: 1 Visit                     Katheryn Leap  PTA Acute  Rehabilitation Services Office M-F          [phone]

## 2024-01-13 MED ORDER — BISACODYL 10 MG RE SUPP
10.0000 mg | Freq: Every day | RECTAL | Status: DC | PRN
  Administered 2024-01-13: 10 mg via RECTAL
  Filled 2024-01-13: qty 1

## 2024-01-13 NOTE — Progress Notes (Signed)
 Physical Therapy Treatment Patient Details Name: Jonathon Anderson MRN: [id number] DOB: 04/27/1956 Today's Date: 01/13/2024   History of Present Illness Pt s/p L THR 2* post-traumatic arthritis and intertrochanteric fx non-union.  Pt with hx of L hip fx 2024 and CVA with residual L hand numbness and tingling    PT Comments  Pt continues cooperative and with noted improvement in activity tolerance and cognition but not quite back to baseline from prior sessions - RN notes improvement as well.    If plan is discharge home, recommend the following: A little help with walking and/or transfers;A little help with bathing/dressing/bathroom;Assistance with cooking/housework;Assist for transportation;Help with stairs or ramp for entrance   Can travel by private vehicle        Equipment Recommendations  Rolling walker (2 wheels)    Recommendations for Other Services       Precautions / Restrictions Precautions Precautions: Posterior Hip Precaution Booklet Issued: Yes (comment) Recall of Precautions/Restrictions: Intact Precaution/Restrictions Comments: wound vac Restrictions Weight Bearing Restrictions Per Provider Order: No LLE Weight Bearing Per Provider Order: Weight bearing as tolerated     Mobility  Bed Mobility Overal bed mobility: Needs Assistance Bed Mobility: Sit to Supine     Supine to sit: Min assist, Mod assist, Used rails Sit to supine: Mod assist   General bed mobility comments: INcreased time with cues for sequence and use of L LE to self assist.  Physical assist to manage LEs into bed    Transfers Overall transfer level: Needs assistance Equipment used: Rolling walker (2 wheels) Transfers: Sit to/from Stand Sit to Stand: Min assist, Mod assist           General transfer comment: Cues for LE management, adherence to THP and use of UEs to self assist    Ambulation/Gait Ambulation/Gait assistance: Min assist, +2 safety/equipment Gait Distance (Feet): 19  Feet Assistive device: Rolling walker (2 wheels) Gait Pattern/deviations: Step-to pattern, Decreased step length - right, Decreased step length - left, Shuffle, Trunk flexed, Wide base of support Gait velocity: decreased     General Gait Details: Required + 2 assist such that recliner was following.   Distance limited by fatigue and increased dyspnea.   Stairs             Wheelchair Mobility     Tilt Bed    Modified Rankin (Stroke Patients Only)       Balance Overall balance assessment: Needs assistance Sitting-balance support: Feet supported, No upper extremity supported Sitting balance-Leahy Scale: Good     Standing balance support: Bilateral upper extremity supported Standing balance-Leahy Scale: Poor Standing balance comment: unable to manage urinal in standing                            Communication Communication Communication: No apparent difficulties  Cognition Arousal: Alert Behavior During Therapy: WFL for tasks assessed/performed   PT - Cognitive impairments: Problem solving                       PT - Cognition Comments: Pt pleasant and cooperative but with noted delayed processing and problem solving but improvement noted from am session Following commands: Intact      Cueing Cueing Techniques: Verbal cues, Gestural cues  Exercises Total Joint Exercises Ankle Circles/Pumps: AROM, Both, 15 reps, Supine Quad Sets: AROM, Both, 10 reps, Supine Heel Slides: AAROM, Left, Supine, 20 reps Hip ABduction/ADduction: AAROM, Left, 15 reps, Supine  General Comments        Pertinent Vitals/Pain Pain Assessment Pain Assessment: 0-10 Pain Score: 3  Pain Location: L hip Pain Descriptors / Indicators: Aching, Sore Pain Intervention(s): Limited activity within patient's tolerance, Monitored during session    Home Living                          Prior Function            PT Goals (current goals can now be found in  the care plan section) Acute Rehab PT Goals Patient Stated Goal: Regain IND PT Goal Formulation: With patient Time For Goal Achievement: 01/17/24 Potential to Achieve Goals: Fair Progress towards PT goals: Progressing toward goals    Frequency    7X/week      PT Plan      Co-evaluation              AM-PAC PT 6 Clicks Mobility   Outcome Measure  Help needed turning from your back to your side while in a flat bed without using bedrails?: A Little Help needed moving from lying on your back to sitting on the side of a flat bed without using bedrails?: A Little Help needed moving to and from a bed to a chair (including a wheelchair)?: A Little Help needed standing up from a chair using your arms (e.g., wheelchair or bedside chair)?: A Little Help needed to walk in hospital room?: A Little Help needed climbing 3-5 steps with a railing? : Total 6 Click Score: 16    End of Session Equipment Utilized During Treatment: Gait belt Activity Tolerance: Patient limited by fatigue Patient left: in bed;with call bell/phone within reach;with bed alarm set;with nursing/sitter in room Nurse Communication: Mobility status PT Visit Diagnosis: Difficulty in walking, not elsewhere classified (R26.2)     Time: 8592-8570 PT Time Calculation (min) (ACUTE ONLY): 22 min  Charges:    $Gait Training: 8-22 mins $Therapeutic Exercise: 8-22 mins PT General Charges $$ ACUTE PT VISIT: 1 Visit                     Katrinka Acton PT Acute Rehabilitation Services Pager [phone] Office [phone]    Salia Cangemi 01/13/2024, 5:03 PM

## 2024-01-13 NOTE — Progress Notes (Signed)
 Physical Therapy Treatment Patient Details Name: Jonathon Anderson MRN: [id number] DOB: 1956-04-25 Today's Date: 01/13/2024   History of Present Illness Pt s/p L THR 2* post-traumatic arthritis and intertrochanteric fx non-union.  Pt with hx of L hip fx 2024 and CVA with residual L hand numbness and tingling    PT Comments  Pt continues cooperative but with noted increased fatigue and mildly delayed processing and problem solving noted.  Pt performed therex program with assist and up to ambulate limited distance with noted increased instability from earlier sessions.  RN aware.    If plan is discharge home, recommend the following: A little help with walking and/or transfers;A little help with bathing/dressing/bathroom;Assistance with cooking/housework;Assist for transportation;Help with stairs or ramp for entrance   Can travel by private vehicle        Equipment Recommendations  Rolling walker (2 wheels)    Recommendations for Other Services       Precautions / Restrictions Precautions Precautions: Posterior Hip Precaution Booklet Issued: Yes (comment) Recall of Precautions/Restrictions: Intact Precaution/Restrictions Comments: wound vac Restrictions Weight Bearing Restrictions Per Provider Order: No LLE Weight Bearing Per Provider Order: Weight bearing as tolerated     Mobility  Bed Mobility Overal bed mobility: Needs Assistance Bed Mobility: Supine to Sit     Supine to sit: Min assist, Mod assist, Used rails     General bed mobility comments: INcreased time with cues for sequence and use of L LE to self assist.  Physical assist to bring trunk to upright.    Transfers Overall transfer level: Needs assistance Equipment used: Rolling walker (2 wheels) Transfers: Sit to/from Stand Sit to Stand: Min assist, Mod assist           General transfer comment: MAX VC's to adhere to THP esp to avoid hip flexion >90 degrees during sit to stand and esp stand to sit.     Ambulation/Gait Ambulation/Gait assistance: Min assist, +2 safety/equipment Gait Distance (Feet): 12 Feet Assistive device: Rolling walker (2 wheels) Gait Pattern/deviations: Step-to pattern, Decreased step length - right, Decreased step length - left, Shuffle, Trunk flexed, Wide base of support Gait velocity: decreased     General Gait Details: Required + 2 assist such that recliner was following.   Distance limited by fatigue and increased dyspnea.   Stairs             Wheelchair Mobility     Tilt Bed    Modified Rankin (Stroke Patients Only)       Balance Overall balance assessment: Needs assistance Sitting-balance support: Feet supported, No upper extremity supported Sitting balance-Leahy Scale: Good     Standing balance support: Bilateral upper extremity supported Standing balance-Leahy Scale: Poor                              Communication Communication Communication: No apparent difficulties  Cognition Arousal: Alert Behavior During Therapy: WFL for tasks assessed/performed   PT - Cognitive impairments: Problem solving                       PT - Cognition Comments: Pt pleasant and cooperative but with noted delayed processing and problem solving vs prior visits Following commands: Intact      Cueing Cueing Techniques: Verbal cues, Gestural cues  Exercises Total Joint Exercises Ankle Circles/Pumps: AROM, Both, 15 reps, Supine Quad Sets: AROM, Both, 10 reps, Supine Heel Slides: AAROM, Left, Supine, 20 reps Hip  ABduction/ADduction: AAROM, Left, 15 reps, Supine    General Comments        Pertinent Vitals/Pain Pain Assessment Pain Assessment: 0-10 Pain Score: 3  Pain Location: L hip Pain Descriptors / Indicators: Aching, Sore Pain Intervention(s): Limited activity within patient's tolerance, Monitored during session, Ice applied    Home Living                          Prior Function            PT  Goals (current goals can now be found in the care plan section) Acute Rehab PT Goals Patient Stated Goal: Regain IND PT Goal Formulation: With patient Time For Goal Achievement: 01/17/24 Potential to Achieve Goals: Fair Progress towards PT goals: Not progressing toward goals - comment (INcreased fatigue and noted delay in processing and problem solving)    Frequency    7X/week      PT Plan      Co-evaluation              AM-PAC PT 6 Clicks Mobility   Outcome Measure  Help needed turning from your back to your side while in a flat bed without using bedrails?: A Little Help needed moving from lying on your back to sitting on the side of a flat bed without using bedrails?: A Little Help needed moving to and from a bed to a chair (including a wheelchair)?: A Little Help needed standing up from a chair using your arms (e.g., wheelchair or bedside chair)?: A Little Help needed to walk in hospital room?: A Lot Help needed climbing 3-5 steps with a railing? : Total 6 Click Score: 15    End of Session Equipment Utilized During Treatment: Gait belt Activity Tolerance: Patient limited by fatigue Patient left: in chair;with call bell/phone within reach Nurse Communication: Mobility status PT Visit Diagnosis: Difficulty in walking, not elsewhere classified (R26.2)     Time: 9094-9061 PT Time Calculation (min) (ACUTE ONLY): 33 min  Charges:    $Gait Training: 8-22 mins $Therapeutic Exercise: 8-22 mins PT General Charges $$ ACUTE PT VISIT: 1 Visit                     Katrinka Acton PT Acute Rehabilitation Services Pager [phone] Office [phone]    Jonathon Anderson 01/13/2024, 4:56 PM

## 2024-01-13 NOTE — Plan of Care (Signed)
  Problem: Safety: Goal: Ability to remain free from injury will improve Outcome: Progressing   Problem: Clinical Measurements: Goal: Postoperative complications will be avoided or minimized Outcome: Progressing   Problem: Pain Management: Goal: Pain level will decrease with appropriate interventions Outcome: Progressing   

## 2024-01-13 NOTE — Progress Notes (Signed)
 [facility] (OTS)  4 Days Post-Op Procedure(s) (LRB): CONVERSION TO TOTAL HIP (Left) APPLICATION, WOUND VAC (Left)  Subjective: Patient reports pain as mild.   PT and nursing are noting improvement in mobility and function.  Objective: Current Vitals Blood pressure 126/82, pulse 89, temperature 98.7 F (37.1 C), resp. rate 18, height 5' 8 (1.727 m), weight 101.6 kg, SpO2 98%. Vital signs in last 24 hours: Temp:  [98.7 F (37.1 C)-98.9 F (37.2 C)] 98.7 F (37.1 C) (06/22 1520) Pulse Rate:  [89-101] 89 (06/22 1520) Resp:  [15-18] 18 (06/22 1520) BP: (108-126)/(76-92) 126/82 (06/22 1520) SpO2:  [91 %-100 %] 98 % (06/22 1520)  Intake/Output from previous day: 06/21 0701 - 06/22 0700 In: 1160 [P.O.:960; IV Piggyback:200] Out: 1550 [Urine:1550]  LABS Recent Labs    01/11/24 0316 01/12/24 0344  HGB 7.5* 8.6*   Recent Labs    01/11/24 0316 01/12/24 0344  WBC 12.7* 11.2*  RBC 2.45* 2.83*  HCT 23.6* 26.5*  PLT 225 221   Recent Labs    01/12/24 0344  NA 134*  K 3.3*  CL 97*  CO2 26  BUN 7*  CREATININE 0.62  GLUCOSE 117*  CALCIUM  7.7*   No results for input(s): LABPT, INR in the last 72 hours.   Physical Exam LLE  Dressing intact, clean, dry  Edema/ swelling controlled  Sens: DPN, SPN, TN intact  Motor: EHL, FHL, and lessor toe ext and flex all intact grossly  Brisk cap refill, warm to touch  Assessment/Plan: 4 Days Post-Op Procedure(s) (LRB): CONVERSION TO TOTAL HIP (Left) APPLICATION, WOUND VAC (Left) 1. PT/OT  2. DVT proph ECASA 3. D/c planning  Ozell Bruch, MD [facility], [facility] [phone]

## 2024-01-14 LAB — TYPE AND SCREEN
ABO/RH(D): O POS
Antibody Screen: NEGATIVE

## 2024-01-14 LAB — AEROBIC/ANAEROBIC CULTURE W GRAM STAIN (SURGICAL/DEEP WOUND)
Culture: NO GROWTH
Culture: NO GROWTH
Culture: NO GROWTH
Gram Stain: NONE SEEN
Gram Stain: NONE SEEN

## 2024-01-14 LAB — BPAM RBC
Blood Product Expiration Date: 202507152359
Blood Product Expiration Date: 202507172359
Blood Product Expiration Date: 202507172359
Blood Product Expiration Date: 202507172359
ISSUE DATE / TIME: 202506200004
ISSUE DATE / TIME: 202506201111
ISSUE DATE / TIME: 202506221349
Unit Type and Rh: 202507172359
Unit Type and Rh: 5100

## 2024-01-14 LAB — CBC
HCT: 27.3 % — ABNORMAL LOW (ref 39.0–52.0)
Hemoglobin: 8.7 g/dL — ABNORMAL LOW (ref 13.0–17.0)
MCH: 30.3 pg (ref 26.0–34.0)
MCHC: 31.9 g/dL (ref 30.0–36.0)
MCV: 95.1 fL (ref 80.0–100.0)
Platelets: 292 10*3/uL (ref 150–400)
RBC: 2.87 MIL/uL — ABNORMAL LOW (ref 4.22–5.81)
RDW: 14.3 % (ref 11.5–15.5)
WBC: 9.9 10*3/uL (ref 4.0–10.5)
nRBC: 0 % (ref 0.0–0.2)

## 2024-01-14 LAB — BASIC METABOLIC PANEL WITH GFR
Anion gap: 10 (ref 5–15)
BUN: 6 mg/dL — ABNORMAL LOW (ref 8–23)
CO2: 28 mmol/L (ref 22–32)
Calcium: 8.2 mg/dL — ABNORMAL LOW (ref 8.9–10.3)
Chloride: 96 mmol/L — ABNORMAL LOW (ref 98–111)
Creatinine, Ser: 0.54 mg/dL — ABNORMAL LOW (ref 0.61–1.24)
GFR, Estimated: >60 mL/min (ref >60)
Glucose, Bld: 114 mg/dL — ABNORMAL HIGH (ref 70–99)
Potassium: 3.6 mmol/L (ref 3.5–5.1)
Sodium: 134 mmol/L — ABNORMAL LOW (ref 135–145)

## 2024-01-14 LAB — MAGNESIUM: Magnesium: 2.3 mg/dL (ref 1.7–2.4)

## 2024-01-14 MED ORDER — CEFADROXIL 500 MG PO CAPS
500.0000 mg | ORAL_CAPSULE | Freq: Two times a day (BID) | ORAL | Status: DC
  Administered 2024-01-14: 500 mg via ORAL
  Filled 2024-01-14: qty 1

## 2024-01-14 NOTE — Plan of Care (Signed)
  Problem: Elimination: Goal: Will not experience complications related to bowel motility Outcome: Progressing   Problem: Elimination: Goal: Will not experience complications related to urinary retention Outcome: Progressing   Problem: Safety: Goal: Ability to remain free from injury will improve Outcome: Progressing   Problem: Education: Goal: Knowledge of the prescribed therapeutic regimen will improve Outcome: Progressing   Problem: Activity: Goal: Ability to avoid complications of mobility impairment will improve Outcome: Progressing   Problem: Pain Management: Goal: Pain level will decrease with appropriate interventions Outcome: Progressing

## 2024-01-14 NOTE — Progress Notes (Signed)
 Physical Therapy Treatment Patient Details Name: Jonathon Anderson MRN: [id number] DOB: April 02, 1956 Today's Date: 01/14/2024   History of Present Illness Pt s/p L THR 2* post-traumatic arthritis and intertrochanteric fx non-union.  Pt with hx of L hip fx 2024 and CVA with residual L hand numbness and tingling    PT Comments  POD # 5 pm session Pt seated edge of recliner attempting to get up without assistance.  Need to use the bathroom.  Assisted with amb to bathroom.  General transfer comment: increased self ability to rise but with VC's to keep head up to avoid excessive forward flexion due to THP.  Also assisted with a toilet transfer. Assisted with posterior peri care during standing while Pt balanced self using walker.  Assisted with amb back to recliner.  Too fatigued to amb in hallway after bathroom use/BM.  Pt reposts he has had two or three good bowel movements. Then perform some TE's following HEP handout.  Instructed on proper tech, freq.  Pt does NOT tolerate ICE packs to his hip.  Makes it hurt more, stated Pt.  Addressed all mobility questions, discussed appropriate activity, educated on use of ICE.   Pt ready for D/C to home with sister.    If plan is discharge home, recommend the following:     Can travel by private vehicle        Equipment Recommendations  Rolling walker (2 wheels)    Recommendations for Other Services       Precautions / Restrictions Precautions Precautions: Posterior Hip Precaution/Restrictions Comments: wound vac Restrictions Weight Bearing Restrictions Per Provider Order: No LLE Weight Bearing Per Provider Order: Weight bearing as tolerated     Mobility  Bed Mobility               General bed mobility comments: OOB in recliner    Transfers Overall transfer level: Needs assistance Equipment used: Rolling walker (2 wheels) Transfers: Sit to/from Stand Sit to Stand: Supervision           General transfer comment:  increased self ability to rise but with VC's to keep head up to avoid excessive forward flexion due to THP.  Also assisted with a toilet transfer.    Ambulation/Gait Ambulation/Gait assistance: Supervision, Contact guard assist Gait Distance (Feet): 18 Feet Assistive device: Rolling walker (2 wheels) Gait Pattern/deviations: Step-to pattern, Decreased step length - right, Decreased step length - left, Shuffle, Trunk flexed, Wide base of support Gait velocity: decreased     General Gait Details: assisted with amb to and from bathroom 9 feet x2 with VC's on safety for turns and navigating walker through tight spaces.   Stairs Stairs: Yes Stairs assistance: Supervision, Contact guard assist Stair Management: Step to pattern, Forwards, Two rails Number of Stairs: 3 General stair comments: Pt was able to safely navigate 3 steps using B rails and Min VC's on proper sequencing.  Did require a seated rest break after as Pt is decondiitoned.   Wheelchair Mobility     Tilt Bed    Modified Rankin (Stroke Patients Only)       Balance                                            Communication Communication Communication: No apparent difficulties  Cognition   Behavior During Therapy: WFL for tasks assessed/performed   PT - Cognitive impairments:  No apparent impairments                       PT - Cognition Comments: AxO x 3 pleasant and following all commands.  Slow to move and easily fatigues.  Progressing.  Pt admits to a Sedentary Lifestyle and Tobacco use. Following commands: Intact      Cueing Cueing Techniques: Verbal cues, Gestural cues  Exercises      General Comments        Pertinent Vitals/Pain Pain Assessment Pain Assessment: Faces Faces Pain Scale: Hurts a little bit Pain Location: L hip Pain Descriptors / Indicators: Aching, Sore, Operative site guarding Pain Intervention(s): Monitored during session, Premedicated before session,  Repositioned    Home Living                          Prior Function            PT Goals (current goals can now be found in the care plan section) Progress towards PT goals: Progressing toward goals    Frequency    7X/week      PT Plan      Co-evaluation              AM-PAC PT 6 Clicks Mobility   Outcome Measure  Help needed turning from your back to your side while in a flat bed without using bedrails?: A Little Help needed moving from lying on your back to sitting on the side of a flat bed without using bedrails?: A Little Help needed moving to and from a bed to a chair (including a wheelchair)?: A Little Help needed standing up from a chair using your arms (e.g., wheelchair or bedside chair)?: A Little Help needed to walk in hospital room?: A Little Help needed climbing 3-5 steps with a railing? : A Lot 6 Click Score: 17    End of Session Equipment Utilized During Treatment: Gait belt Activity Tolerance: Patient limited by fatigue Patient left: in chair;with call bell/phone within reach Nurse Communication: Mobility status PT Visit Diagnosis: Difficulty in walking, not elsewhere classified (R26.2)     Time: 8650-8585 PT Time Calculation (min) (ACUTE ONLY): 25 min  Charges:    $Gait Training: 8-22 mins $Therapeutic Activity: 8-22 mins PT General Charges $$ ACUTE PT VISIT: 1 Visit                     Katheryn Leap  PTA Acute  Rehabilitation Services Office M-F          [phone]

## 2024-01-14 NOTE — Plan of Care (Signed)
  Problem: Activity: Goal: Risk for activity intolerance will decrease Outcome: Progressing   Problem: Safety: Goal: Ability to remain free from injury will improve Outcome: Progressing   Problem: Pain Managment: Goal: General experience of comfort will improve and/or be controlled Outcome: Progressing

## 2024-01-14 NOTE — Progress Notes (Signed)
 Switched to prevena wound VAC.

## 2024-01-14 NOTE — Progress Notes (Signed)
 Physical Therapy Treatment Patient Details Name: Jonathon Anderson MRN: [id number] DOB: [birth year] Today's Date: 01/14/2024   History of Present Illness Pt s/p L THR 2* post-traumatic arthritis and intertrochanteric fx non-union.  Pt with hx of L hip fx 2024 and CVA with residual L hand numbness and tingling    PT Comments  POD # 5 am session PT - Cognition Comments: AxO x 3 pleasant and following all commands.  Slow to move and easily fatigues.  Progressing.  Pt admits to a Sedentary Lifestyle and Tobacco use. Pt OOB in recliner.  Assisted with amb as well as practice stairs.   General transfer comment: increased self ability to rise but with VC's to keep head up to avoid excessive forward flexion due to THP   General Gait Details: limited amb distance and activity tolerance but beleive he is close to his baseline as Pt stated this is about how much I have been doing (walking).  Pt also present with increased dyspnea/SOB, Pt admits to smoking for years.  General stair comments: Pt was able to safely navigate 3 steps using B rails and Min VC's on proper sequencing.  Did require a seated rest break after as Pt is decondiitoned.  Will see Pt again this afternoon to address HEP.  Expect D/C to home after pm PT session.    If plan is discharge home, recommend the following:     Can travel by private vehicle        Equipment Recommendations  Rolling walker (2 wheels)    Recommendations for Other Services       Precautions / Restrictions Precautions Precautions: Posterior Hip Precaution/Restrictions Comments: wound vac Restrictions Weight Bearing Restrictions Per Provider Order: No LLE Weight Bearing Per Provider Order: Weight bearing as tolerated     Mobility  Bed Mobility               General bed mobility comments: OOB in recliner    Transfers Overall transfer level: Needs assistance Equipment used: Rolling walker (2 wheels) Transfers: Sit to/from Stand Sit to  Stand: Supervision           General transfer comment: increased self ability to rise but with VC's to keep head up to avoid excessive forward flexion due to THP    Ambulation/Gait Ambulation/Gait assistance: Supervision, Contact guard assist Gait Distance (Feet): 16 Feet Assistive device: Rolling walker (2 wheels) Gait Pattern/deviations: Step-to pattern, Decreased step length - right, Decreased step length - left, Shuffle, Trunk flexed, Wide base of support Gait velocity: decreased     General Gait Details: limited amb distance and activity tolerance but beleive he is close to his baseline as Pt stated this is about how much I have been doing (walking).  Pt also present with increased dyspnea/SOB, Pt admits to smoking for years.   Stairs Stairs: Yes Stairs assistance: Supervision, Contact guard assist Stair Management: Step to pattern, Forwards, Two rails Number of Stairs: 3 General stair comments: Pt was able to safely navigate 3 steps using B rails and Min VC's on proper sequencing.  Did require a seated rest break after as Pt is decondiitoned.   Wheelchair Mobility     Tilt Bed    Modified Rankin (Stroke Patients Only)       Balance  Communication Communication Communication: No apparent difficulties  Cognition   Behavior During Therapy: WFL for tasks assessed/performed   PT - Cognitive impairments: No apparent impairments                       PT - Cognition Comments: AxO x 3 pleasant and following all commands.  Slow to move and easily fatigues.  Progressing.  Pt admits to a Sedentary Lifestyle and Tobacco use. Following commands: Intact      Cueing Cueing Techniques: Verbal cues, Gestural cues  Exercises      General Comments        Pertinent Vitals/Pain Pain Assessment Pain Assessment: Faces Faces Pain Scale: Hurts a little bit Pain Location: L hip Pain Descriptors /  Indicators: Aching, Sore, Operative site guarding Pain Intervention(s): Monitored during session, Premedicated before session, Repositioned    Home Living                          Prior Function            PT Goals (current goals can now be found in the care plan section) Progress towards PT goals: Progressing toward goals    Frequency    7X/week      PT Plan      Co-evaluation              AM-PAC PT 6 Clicks Mobility   Outcome Measure  Help needed turning from your back to your side while in a flat bed without using bedrails?: A Little Help needed moving from lying on your back to sitting on the side of a flat bed without using bedrails?: A Little Help needed moving to and from a bed to a chair (including a wheelchair)?: A Little Help needed standing up from a chair using your arms (e.g., wheelchair or bedside chair)?: A Little Help needed to walk in hospital room?: A Little Help needed climbing 3-5 steps with a railing? : A Lot 6 Click Score: 17    End of Session Equipment Utilized During Treatment: Gait belt Activity Tolerance: Patient limited by fatigue Patient left: in chair;with call bell/phone within reach Nurse Communication: Mobility status PT Visit Diagnosis: Difficulty in walking, not elsewhere classified (R26.2)     Time: 8969-8945 PT Time Calculation (min) (ACUTE ONLY): 24 min  Charges:    $Gait Training: 8-22 mins $Therapeutic Activity: 8-22 mins PT General Charges $$ ACUTE PT VISIT: 1 Visit                    Katheryn Leap  PTA Acute  Rehabilitation Services Office M-F          [phone]

## 2024-01-14 NOTE — Progress Notes (Signed)
Discharge package printed and instructions given to pt. Pt verbalizes understanding. 

## 2024-01-14 NOTE — Progress Notes (Signed)
     Subjective: [name] is doing very well overall.  Continues to make gradual progress progress with physical therapy.  Hemoglobin has been stable, this morning 8.7.  Had 2 bowel movements yesterday.  Hopeful for discharge home today.  Objective:   VITALS:   Vitals:   01/13/24 0555 01/13/24 1520 01/13/24 1957 01/14/24 0458  BP: 125/76 126/82 132/73 137/75  Pulse: 90 89 99 90  Resp: 16 18 17 17   Temp: 98.9 F (37.2 C) 98.7 F (37.1 C) 99.4 F (37.4 C) 98 F (36.7 C)  TempSrc:    Oral  SpO2: 100% 98% 98% 98%  Weight:      Height:        Sensation intact distally Intact pulses distally Dorsiflexion/Plantar flexion intact Incision: dressing C/D/I Compartment soft Wound VAC holding suction no drainage in the canister.  Lab Results  Component Value Date   WBC 9.9 01/14/2024   HGB 8.7 (L) 01/14/2024   HCT 27.3 (L) 01/14/2024   MCV 95.1 01/14/2024   PLT 292 01/14/2024   BMET    Component Value Date/Time   NA 134 (L) 01/14/2024 0320   K 3.6 01/14/2024 0320   CL 96 (L) 01/14/2024 0320   CO2 28 01/14/2024 0320   GLUCOSE 114 (H) 01/14/2024 0320   BUN 6 (L) 01/14/2024 0320   CREATININE 0.54 (L) 01/14/2024 0320   CALCIUM  8.2 (L) 01/14/2024 0320   GFRNONAA >60 01/14/2024 0320      Xray: Proximal femoral replacement and arthroplasty components in good alignment no adverse features  Assessment/Plan: 5 Days Post-Op   Principal Problem:   Closed fracture of left femur with nonunion  Status post left hip conversion to proximal femur replacement total hip arthroplasty for chronic subtrochanteric fracture nonunion 01/09/24   Post op recs: WB: WBAT LLE, 6 weeks of posterior precautions, hip abduction pillow when in bed Abx: ancef  in-house and discharged on cefadroxil, follow-up IntraOp cultures: NGTD Imaging: PACU pelvis Xray Dressing: Prevena wound VAC DVT prophylaxis: Aspirin  81BID starting POD1 Follow up: Scheduled for 6/26 for wound VAC removal and wound  check Address: [address], [place], [place] [address]  Office Phone: [phone]    Jonathon Anderson A Jonathon Anderson 01/14/2024, 6:57 AM   Jonathon Higashi, MD  Contact information:   [phone] 7am-5pm epic message Dr. Higashi, or call office for patient follow up: [phone] After hours and holidays please check [email or web address] for group call information for [facility]

## 2024-01-14 NOTE — Care Management Important Message (Signed)
 Important Message  Patient Details IM Letter given. Name: Jonathon Anderson MRN: [id number] Date of Birth: Feb 24, 1956   Important Message Given:  Yes - Medicare IM     Melba Ates 01/14/2024, 10:59 AM

## 2024-01-14 NOTE — Discharge Summary (Signed)
 Physician Discharge Summary  Patient ID: Jonathon Anderson MRN: [id number] DOB/AGE: [birth year] 68 y.o.  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  As well postoperatively.  Benefit from 1 unit blood transfusion given normal postop hemoglobin.  Progressed well gradually with physical therapy and was deemed safe for discharge home 01/14/2024.  He was given perioperative antibiotics:  Anti-infectives (From admission, onward)    Start     Dose/Rate Route Frequency Ordered Stop   01/14/24 0701  cefadroxil (DURICEF) capsule 500 mg        500 mg Oral 2 times daily 01/14/24 0702 01/21/24 0959   01/09/24 2200  ceFAZolin  (ANCEF ) IVPB 2g/100 mL premix        2 g 200 mL/hr over 30 Minutes Intravenous Every 8 hours 01/09/24 1701 01/12/24 1534   01/09/24 0815  ceFAZolin  (ANCEF ) IVPB 2g/100 mL premix        2 g 200 mL/hr over 30 Minutes Intravenous On call to O.R. 01/09/24 0808 01/09/24 1522   01/09/24 0000  cefadroxil (DURICEF) 500 MG capsule        500 mg Oral 2 times daily 01/09/24 1100 01/16/24 2359     .  He was given sequential compression devices, early ambulation, and aspirin  for DVT prophylaxis.  He benefited maximally from the hospital stay and  there were no complications.    Recent vital signs:  Vitals:   01/13/24 1957 01/14/24 0458  BP: 132/73 137/75  Pulse: 99 90  Resp: 17 17  Temp: 99.4 F (37.4 C) 98 F (36.7 C)  SpO2: 98% 98%    Recent laboratory studies:  Lab Results  Component Value Date   HGB 8.7 (L) 01/14/2024   HGB 8.6 (L) 01/12/2024   HGB 7.5 (L) 01/11/2024   Lab Results  Component Value Date   WBC 9.9 01/14/2024   PLT 292 01/14/2024   Lab Results  Component Value Date   INR 1.0 07/27/2022   Lab Results  Component Value Date   NA 134 (L) 01/14/2024   K 3.6 01/14/2024   CL 96 (L) 01/14/2024   CO2 28 01/14/2024   BUN 6 (L) 01/14/2024   CREATININE 0.54 (L) 01/14/2024   GLUCOSE 114 (H) 01/14/2024    Discharge Medications:   Allergies as of 01/14/2024   No Known Allergies      Medication List     TAKE these medications    acetaminophen  500 MG tablet Commonly known as: TYLENOL  Take 2 tablets (1,000 mg total) by mouth every 8 (eight) hours as needed. What changed:  when to take this reasons to take this   amLODipine  10 MG tablet Commonly known as: NORVASC  Take 10 mg by mouth in the morning.  aspirin  EC 81 MG tablet Take 1 tablet (81 mg total) by mouth 2 (two) times daily for 28 days. Swallow whole.   atorvastatin  20 MG tablet Commonly known as: LIPITOR Take 20 mg by mouth at bedtime.   BIOFREEZE EX Apply 1 Application topically daily as needed (pain).   cefadroxil 500 MG capsule Commonly known as: DURICEF Take 1 capsule (500 mg total) by mouth 2 (two) times daily for 7 days.   chlorhexidine  4 % external liquid Commonly known as: HIBICLENS  Apply 15 mLs (1 Application total) topically as directed for 30 doses. Use as directed daily for 5 days every other week for 6 weeks.   cyclobenzaprine  10 MG tablet Commonly known as: FLEXERIL  Take 1 tablet (10 mg total) by mouth every 12 (twelve) hours as needed for muscle spasms. What changed:  when to take this reasons to take  this   diclofenac 75 MG EC tablet Commonly known as: VOLTAREN Take 1 tablet (75 mg total) by mouth 2 (two) times daily.   magnesium  hydroxide 400 MG/5ML suspension Commonly known as: MILK OF MAGNESIA Take 15 mLs by mouth daily as needed (constipation.).   metoprolol  tartrate 100 MG tablet Commonly known as: LOPRESSOR  Take 100 mg by mouth 2 (two) times daily.   mupirocin  ointment 2 % Commonly known as: BACTROBAN  Place 1 Application into the nose 2 (two) times daily for 60 doses. Use as directed 2 times daily for 5 days every other week for 6 weeks.   ondansetron  4 MG tablet Commonly known as: Zofran  Take 1 tablet (4 mg total) by mouth every 8 (eight) hours as needed for up to 14 days for nausea or vomiting.   oxyCODONE  5 MG immediate release tablet Commonly known as: Roxicodone  Take 1 tablet (5 mg total) by mouth every 4 (four) hours as needed for up to 7 days for severe pain (pain score 7-10) or moderate pain (pain score 4-6).        Diagnostic Studies: DG HIP UNILAT W OR W/O PELVIS 2-3 VIEWS LEFT Result Date: 01/09/2024 CLINICAL DATA:  Postoperative left hip revision. EXAM: DG HIP (WITH OR WITHOUT PELVIS) 2-3V LEFT COMPARISON:  Pelvis and left hip radiographs 07/27/2022, CT left hip 01/15/2023 FINDINGS: Interval removal of the prior proximal lateral femoral blade plate and interval placement of new moderately long stem total left hip arthroplasty. No perihardware lucency is seen to indicate hardware failure or loosening. There are two small screws overlying the lateral left femoral cortex at the level of the distal aspect of the femoral stem. Moderate right femoroacetabular joint space narrowing. IMPRESSION: Interval removal of the prior proximal lateral femoral blade plate and interval placement of new moderately long stem total left hip arthroplasty. No evidence of hardware failure or loosening. Electronically Signed   By: Tanda Lyons M.D.   On: 01/09/2024 19:37   DG HIP UNILAT  WITH PELVIS 1V LEFT Result Date: 01/09/2024 CLINICAL DATA:  Elective surgery.  Left hip revision. EXAM: DG HIP (WITH OR WITHOUT PELVIS) 1V*L* COMPARISON:  4 left hip radiographs 07/27/2022, CT left hip 01/15/2023 FINDINGS: Images were performed intraoperatively without the presence of a radiologist. Interval removal of the prior proximal lateral femoral blade plate. The patient is undergoing new total left hip arthroplasty. No hardware complication is seen. Total fluoroscopy images: 4 Total fluoroscopy time: 36 seconds Total dose: Radiation Exposure Index (as provided by the fluoroscopic device): 9.8 mGy air Kerma Please see intraoperative findings for further detail. IMPRESSION: Intraoperative fluoroscopy for total left hip  arthroplasty revision. Electronically Signed   By: Tanda Lyons M.D.   On: 01/09/2024 19:32   DG C-Arm 1-60 Min-No Report Result Date: 01/09/2024 Fluoroscopy was utilized by the requesting physician.  No radiographic interpretation.   DG C-Arm 1-60 Min-No Report Result Date: 01/09/2024 Fluoroscopy was utilized by the requesting physician.  No radiographic interpretation.   DG C-Arm 1-60 Min-No Report Result Date: 01/09/2024 Fluoroscopy was utilized by the requesting physician.  No radiographic interpretation.   DG C-Arm 1-60 Min-No Report Result Date: 01/09/2024 Fluoroscopy was utilized by the requesting physician.  No radiographic interpretation.    Disposition: Discharge disposition: 01-Home or Self Care       Discharge Instructions     Call MD / Call 911   Complete by: As directed    If you experience chest pain or shortness of breath, CALL 911 and be transported to the hospital emergency room.  If you develope a fever above 101 F, pus (white drainage) or increased drainage or redness at the wound, or calf pain, call your surgeon's office.   Constipation Prevention   Complete by: As directed    Drink plenty of fluids.  Prune juice may be helpful.  You may use a  stool softener, such as Colace (over the counter) 100 mg twice a day.  Use MiraLax (over the counter) for constipation as needed.   Diet - low sodium heart healthy   Complete by: As directed    Follow the hip precautions as taught in Physical Therapy   Complete by: As directed    Increase activity slowly as tolerated   Complete by: As directed    Post-operative opioid taper instructions:   Complete by: As directed    POST-OPERATIVE OPIOID TAPER INSTRUCTIONS: It is important to wean off of your opioid medication as soon as possible. If you do not need pain medication after your surgery it is ok to stop day one. Opioids include: Codeine, Hydrocodone (Norco, Vicodin), Oxycodone (Percocet, oxycontin ) and hydromorphone  amongst others.  Long term and even short term use of opiods can cause: Increased pain response Dependence Constipation Depression Respiratory depression And more.  Withdrawal symptoms can include Flu like symptoms Nausea, vomiting And more Techniques to manage these symptoms Hydrate well Eat regular healthy meals Stay active Use relaxation techniques(deep breathing, meditating, yoga) Do Not substitute Alcohol  to help with tapering If you have been on opioids for less than two weeks and do not have pain than it is ok to stop all together.  Plan to wean off of opioids This plan should start within one week post op of your joint replacement. Maintain the same interval or time between taking each dose and first decrease the dose.  Cut the total daily intake of opioids by one tablet each day Next start to increase the time between doses. The last dose that should be eliminated is the evening dose.           Follow-up Information     Edna Toribio LABOR, MD Follow up in 1 week(s).   Specialty: Orthopedic Surgery Contact information: [address] [place] [address] [phone]                    Discharge Instructions       INSTRUCTIONS AFTER JOINT REPLACEMENT   Remove items at home which could result in a fall. This includes throw rugs or furniture in walking pathways ICE to the affected joint every three hours while awake for 30  minutes at a time, for at least the first 3-5 days, and then as needed for pain and swelling.  Continue to use ice for pain and swelling. You may notice swelling that will progress down to the foot and ankle.  This is normal after surgery.  Elevate your leg when you are not up walking on it.   Continue to use the breathing machine you got in the hospital (incentive spirometer) which will help keep your temperature down.  It is common for your temperature to cycle up and down following surgery, especially at night when you are not up moving around and exerting yourself.  The breathing machine keeps your lungs expanded and your temperature down.  DIET:  As you were doing prior to hospitalization, we recommend a well-balanced diet.  DRESSING / WOUND CARE / SHOWERING:  Keep the surgical dressing until follow up.  This has battery powered suction to be kept at 125 mmHg and lasts 7 days.  If the battery stops sooner than 7 days, then call our office for further instructions.    The dressing is also water  resistant, however it is best to shower with an extra covering, such as saran wrap to keep it dry.  IF THE DRESSING FALLS OFF or the wound gets wet inside, change the dressing with sterile gauze and call our office for further instruction.  Please use good hand washing techniques before changing the dressing.  Do not use any lotions or creams on the incision until instructed by your surgeon.     ACTIVITY  Increase activity slowly as tolerated, but follow the weight bearing instructions below.   No driving for 6 weeks or until further direction given by your physician.  You cannot drive while taking narcotics.  No lifting or carrying greater than 10 lbs. until further directed by your  surgeon. Avoid periods of inactivity such as sitting longer than an hour when not asleep. This helps prevent blood clots.  You may return to work once you are authorized by your doctor.   WEIGHT BEARING: Weight bearing as tolerated with assist device (walker, cane, etc) as directed, use it as long as suggested by your surgeon or therapist, typically at least 4-6 weeks.  EXERCISES  Results after joint replacement surgery are often greatly improved when you follow the exercise, range of motion and muscle strengthening exercises prescribed by your doctor. Safety measures are also important to protect the joint from further injury. Any time any of these exercises cause you to have increased pain or swelling, decrease what you are doing until you are comfortable again and then slowly increase them. If you have problems or questions, call your caregiver or physical therapist for advice.   Rehabilitation is important following a joint replacement. After just a few days of immobilization, the muscles of the leg can become weakened and shrink (atrophy).  These exercises are designed to build up the tone and strength of the thigh and leg muscles and to improve motion. Often times heat used for twenty to thirty minutes before working out will loosen up your tissues and help with improving the range of motion but do not use heat for the first two weeks following surgery (sometimes heat can increase post-operative swelling).   These exercises can be done on a training (exercise) mat, on the floor, on a table or on a bed. Use whatever works the best and is most comfortable for you.    Use music or television while you are exercising  so that the exercises are a pleasant break in your day. This will make your life better with the exercises acting as a break in your routine that you can look forward to.   Perform all exercises about fifteen times, three times per day or as directed.  You should exercise both the  operative leg and the other leg as well.  Exercises include:   Quad Sets - Tighten up the muscle on the front of the thigh (Quad) and hold for 5-10 seconds.   Straight Leg Raises - With your knee straight (if you were given a brace, keep it on), lift the leg to 60 degrees, hold for 3 seconds, and slowly lower the leg.  Perform this exercise against resistance later as your leg gets stronger.  Leg Slides: Lying on your back, slowly slide your foot toward your buttocks, bending your knee up off the floor (only go as far as is comfortable). Then slowly slide your foot back down until your leg is flat on the floor again.  Angel Wings: Lying on your back spread your legs to the side as far apart as you can without causing discomfort.  Hamstring Strength:  Lying on your back, push your heel against the floor with your leg straight by tightening up the muscles of your buttocks.  Repeat, but this time bend your knee to a comfortable angle, and push your heel against the floor.  You may put a pillow under the heel to make it more comfortable if necessary.   A rehabilitation program following joint replacement surgery can speed recovery and prevent re-injury in the future due to weakened muscles. Contact your doctor or a physical therapist for more information on knee rehabilitation.   CONSTIPATION:  Constipation is defined medically as fewer than three stools per week and severe constipation as less than one stool per week.  Even if you have a regular bowel pattern at home, your normal regimen is likely to be disrupted due to multiple reasons following surgery.  Combination of anesthesia, postoperative narcotics, change in appetite and fluid intake all can affect your bowels.   YOU MUST use at least one of the following options; they are listed in order of increasing strength to get the job done.  They are all available over the counter, and you may need to use some, POSSIBLY even all of these options:     Drink plenty of fluids (prune juice may be helpful) and high fiber foods Colace 100 mg by mouth twice a day  Senokot for constipation as directed and as needed Dulcolax (bisacodyl ), take with full glass of water   Miralax (polyethylene glycol) once or twice a day as needed.  If you have tried all these things and are unable to have a bowel movement in the first 3-4 days after surgery call either your surgeon or your primary doctor.    If you experience loose stools or diarrhea, hold the medications until you stool forms back up.  If your symptoms do not get better within 1 week or if they get worse, check with your doctor.  If you experience the worst abdominal pain ever or develop nausea or vomiting, please contact the office immediately for further recommendations for treatment.  ITCHING:  If you experience itching with your medications, try taking only a single pain pill, or even half a pain pill at a time.  You can also use Benadryl  over the counter for itching or also to help with sleep.  TED HOSE STOCKINGS:  Use stockings on both legs until for at least 2 weeks or as directed by physician office. They may be removed at night for sleeping.  MEDICATIONS:  See your medication summary on the "After Visit Summary" that nursing will review with you.  You may have some home medications which will be placed on hold until you complete the course of blood thinner medication.  It is important for you to complete the blood thinner medication as prescribed.  Blood clot prevention (DVT Prophylaxis): After surgery you are at an increased risk for a blood clot.  You were prescribed a blood thinner, Aspirin  81mg , to be taken twice daily for a total of 4 weeks from surgery to help reduce your risk of getting a blood clot.  Signs of a pulmonary embolus (blood clot in the lungs) include sudden short of breath, feeling lightheaded or dizzy, chest pain with a deep breath, rapid pulse rapid breathing.  Signs of  a blood clot in your arms or legs include new unexplained swelling and cramping, warm, red or darkened skin around the painful area.  Please call the office or 911 right away if these signs or symptoms develop.  PRECAUTIONS:   If you experience chest pain or shortness of breath - call 911 immediately for transfer to the hospital emergency department.   If you develop a fever greater that 101 F, purulent drainage from wound, increased redness or drainage from wound, foul odor from the wound/dressing, or calf pain - CONTACT YOUR SURGEON.                                                   FOLLOW-UP APPOINTMENTS:  If you do not already have a post-op appointment, please call the office for an appointment to be seen by your surgeon.  Guidelines for how soon to be seen are listed in your "After Visit Summary", but are typically between 2-3 weeks after surgery.  If you have a specialized bandage, you may be told to follow up 1 week after surgery.  POST-OPERATIVE OPIOID TAPER INSTRUCTIONS: It is important to wean off of your opioid medication as soon as possible. If you do not need pain medication after your surgery it is ok to stop day one. Opioids include: Codeine, Hydrocodone (Norco, Vicodin), Oxycodone (Percocet, oxycontin ) and hydromorphone  amongst others.  Long term and even short term use of opiods can cause: Increased pain response Dependence Constipation Depression Respiratory depression And more.  Withdrawal symptoms can include Flu like symptoms Nausea, vomiting And more Techniques to manage these symptoms Hydrate well Eat regular healthy meals Stay active Use relaxation techniques(deep breathing, meditating, yoga) Do Not substitute Alcohol  to help with tapering If you have been on opioids for less than two weeks and do not have pain than it is ok to stop all together.  Plan to wean off of opioids This plan should start within one week post op of your joint replacement. Maintain the  same interval or time between taking each dose and first decrease the dose.  Cut the total daily intake of opioids by one tablet each day Next start to increase the time between doses. The last dose that should be eliminated is the evening dose.   MAKE SURE YOU:  Understand these instructions.  Get help right away if you are not doing well or get  worse.    Thank you for letting us  be a part of your medical care team.  It is a privilege we respect greatly.  We hope these instructions will help you stay on track for a fast and full recovery!            Signed: Delora Gravatt A Else Habermann 01/14/2024, 7:07 AM

## 2024-01-18 ENCOUNTER — Encounter (HOSPITAL_COMMUNITY): Payer: Self-pay | Admitting: Infectious Diseases

## 2024-01-18 ENCOUNTER — Emergency Department (HOSPITAL_COMMUNITY)

## 2024-01-18 ENCOUNTER — Other Ambulatory Visit: Payer: Self-pay

## 2024-01-18 ENCOUNTER — Inpatient Hospital Stay (HOSPITAL_COMMUNITY)
Admission: EM | Admit: 2024-01-18 | Discharge: 2024-01-24 | DRG: 467 | Disposition: A | Discharge status: Home / Self Care | Attending: Internal Medicine | Admitting: Internal Medicine

## 2024-01-18 DIAGNOSIS — R000 Tachycardia, unspecified: Secondary | ICD-10-CM | POA: Diagnosis not present

## 2024-01-18 DIAGNOSIS — D539 Nutritional anemia, unspecified: Secondary | ICD-10-CM | POA: Diagnosis not present

## 2024-01-18 DIAGNOSIS — S73005A Unspecified dislocation of left hip, initial encounter: Secondary | ICD-10-CM | POA: Diagnosis present

## 2024-01-18 DIAGNOSIS — Z8249 Family history of ischemic heart disease and other diseases of the circulatory system: Secondary | ICD-10-CM

## 2024-01-18 DIAGNOSIS — S79002A Unspecified physeal fracture of upper end of left femur, initial encounter for closed fracture: Secondary | ICD-10-CM | POA: Diagnosis not present

## 2024-01-18 DIAGNOSIS — Z79899 Other long term (current) drug therapy: Secondary | ICD-10-CM

## 2024-01-18 DIAGNOSIS — M1652 Unilateral post-traumatic osteoarthritis, left hip: Secondary | ICD-10-CM | POA: Diagnosis not present

## 2024-01-18 DIAGNOSIS — E876 Hypokalemia: Secondary | ICD-10-CM | POA: Diagnosis not present

## 2024-01-18 DIAGNOSIS — D638 Anemia in other chronic diseases classified elsewhere: Secondary | ICD-10-CM | POA: Diagnosis present

## 2024-01-18 DIAGNOSIS — M1990 Unspecified osteoarthritis, unspecified site: Secondary | ICD-10-CM | POA: Diagnosis present

## 2024-01-18 DIAGNOSIS — Z8673 Personal history of transient ischemic attack (TIA), and cerebral infarction without residual deficits: Secondary | ICD-10-CM

## 2024-01-18 DIAGNOSIS — D62 Acute posthemorrhagic anemia: Secondary | ICD-10-CM | POA: Diagnosis not present

## 2024-01-18 DIAGNOSIS — E538 Deficiency of other specified B group vitamins: Secondary | ICD-10-CM | POA: Diagnosis present

## 2024-01-18 DIAGNOSIS — K5900 Constipation, unspecified: Secondary | ICD-10-CM | POA: Diagnosis present

## 2024-01-18 DIAGNOSIS — M25559 Pain in unspecified hip: Secondary | ICD-10-CM | POA: Diagnosis not present

## 2024-01-18 DIAGNOSIS — Z96642 Presence of left artificial hip joint: Secondary | ICD-10-CM | POA: Diagnosis not present

## 2024-01-18 DIAGNOSIS — I679 Cerebrovascular disease, unspecified: Secondary | ICD-10-CM | POA: Diagnosis not present

## 2024-01-18 DIAGNOSIS — I10 Essential (primary) hypertension: Secondary | ICD-10-CM | POA: Diagnosis present

## 2024-01-18 DIAGNOSIS — E782 Mixed hyperlipidemia: Secondary | ICD-10-CM | POA: Diagnosis not present

## 2024-01-18 DIAGNOSIS — E785 Hyperlipidemia, unspecified: Secondary | ICD-10-CM | POA: Diagnosis present

## 2024-01-18 DIAGNOSIS — R030 Elevated blood-pressure reading, without diagnosis of hypertension: Secondary | ICD-10-CM | POA: Diagnosis not present

## 2024-01-18 DIAGNOSIS — S73015A Posterior dislocation of left hip, initial encounter: Secondary | ICD-10-CM | POA: Diagnosis not present

## 2024-01-18 DIAGNOSIS — J449 Chronic obstructive pulmonary disease, unspecified: Secondary | ICD-10-CM | POA: Diagnosis present

## 2024-01-18 DIAGNOSIS — M7989 Other specified soft tissue disorders: Secondary | ICD-10-CM | POA: Diagnosis not present

## 2024-01-18 DIAGNOSIS — Z87891 Personal history of nicotine dependence: Secondary | ICD-10-CM | POA: Diagnosis not present

## 2024-01-18 DIAGNOSIS — T84021A Dislocation of internal left hip prosthesis, initial encounter: Secondary | ICD-10-CM | POA: Diagnosis not present

## 2024-01-18 DIAGNOSIS — D72829 Elevated white blood cell count, unspecified: Secondary | ICD-10-CM | POA: Diagnosis present

## 2024-01-18 DIAGNOSIS — Y792 Prosthetic and other implants, materials and accessory orthopedic devices associated with adverse incidents: Secondary | ICD-10-CM | POA: Diagnosis present

## 2024-01-18 DIAGNOSIS — Z471 Aftercare following joint replacement surgery: Secondary | ICD-10-CM | POA: Diagnosis not present

## 2024-01-18 DIAGNOSIS — T8484XA Pain due to internal orthopedic prosthetic devices, implants and grafts, initial encounter: Secondary | ICD-10-CM | POA: Diagnosis not present

## 2024-01-18 LAB — SURGICAL PCR SCREEN
MRSA, PCR: NEGATIVE
Staphylococcus aureus: NEGATIVE

## 2024-01-18 MED ORDER — OXYCODONE HCL 5 MG PO TABS: 5.0000 mg | ORAL_TABLET | ORAL | Status: DC | PRN

## 2024-01-18 MED ORDER — ACETAMINOPHEN 650 MG RE SUPP: 650.0000 mg | Freq: Four times a day (QID) | RECTAL | Status: DC | PRN

## 2024-01-18 MED ORDER — PHENYLEPHRINE 80 MCG/ML (10ML) SYRINGE FOR IV PUSH (FOR BLOOD PRESSURE SUPPORT)
80.0000 ug | PREFILLED_SYRINGE | Freq: Once | INTRAVENOUS | Status: DC | PRN
  Filled 2024-01-18: qty 10

## 2024-01-18 MED ORDER — CYCLOBENZAPRINE HCL 10 MG PO TABS: 10.0000 mg | ORAL_TABLET | Freq: Two times a day (BID) | ORAL | Status: DC | PRN

## 2024-01-18 MED ORDER — POLYETHYLENE GLYCOL 3350 17 G PO PACK
17.0000 g | PACK | Freq: Every day | ORAL | Status: DC | PRN
Start: 2024-01-18 — End: 2024-01-23

## 2024-01-18 MED ORDER — ATORVASTATIN CALCIUM 10 MG PO TABS
20.0000 mg | ORAL_TABLET | Freq: Every day | ORAL | Status: DC
  Administered 2024-01-18 – 2024-01-23 (×6): 20 mg via ORAL
  Filled 2024-01-18 (×6): qty 2

## 2024-01-18 MED ORDER — SENNA 8.6 MG PO TABS
1.0000 | ORAL_TABLET | Freq: Two times a day (BID) | ORAL | Status: DC
  Administered 2024-01-18 – 2024-01-24 (×12): 8.6 mg via ORAL
  Filled 2024-01-18 (×12): qty 1

## 2024-01-18 MED ORDER — HEPARIN SODIUM (PORCINE) 5000 UNIT/ML IJ SOLN
5000.0000 [IU] | Freq: Three times a day (TID) | INTRAMUSCULAR | Status: DC
  Administered 2024-01-18 – 2024-01-20 (×6): 5000 [IU] via SUBCUTANEOUS
  Filled 2024-01-18 (×6): qty 1

## 2024-01-18 MED ORDER — ACETAMINOPHEN 325 MG PO TABS: 650.0000 mg | ORAL_TABLET | Freq: Four times a day (QID) | ORAL | Status: DC | PRN

## 2024-01-18 MED ORDER — PROPOFOL 10 MG/ML IV BOLUS
0.5000 mg/kg | Freq: Once | INTRAVENOUS | Status: DC
  Filled 2024-01-18: qty 20

## 2024-01-18 MED ORDER — ACETAMINOPHEN 500 MG PO TABS
1000.0000 mg | ORAL_TABLET | Freq: Three times a day (TID) | ORAL | Status: DC | PRN
  Administered 2024-01-18: 1000 mg via ORAL
  Filled 2024-01-18: qty 2

## 2024-01-18 MED ORDER — PROPOFOL 10 MG/ML IV BOLUS
INTRAVENOUS | Status: AC | PRN
  Administered 2024-01-18 (×2): 30 mg via INTRAVENOUS
  Administered 2024-01-18 (×2): 20 mg via INTRAVENOUS
  Administered 2024-01-18: 30 mg via INTRAVENOUS
  Administered 2024-01-18: 50 mg via INTRAVENOUS
  Administered 2024-01-18: 20 mg via INTRAVENOUS

## 2024-01-18 MED ORDER — HYDROMORPHONE HCL 1 MG/ML IJ SOLN
0.5000 mg | INTRAMUSCULAR | Status: DC | PRN
  Administered 2024-01-18 – 2024-01-19 (×2): 0.5 mg via INTRAVENOUS
  Filled 2024-01-18 (×2): qty 0.5

## 2024-01-18 NOTE — Progress Notes (Signed)
 Patient ID: Jonathon Anderson, male   DOB: 25-Sep-1955, 68 y.o.   MRN: [id number]   LOS: 0 days   Subjective: Pt states he had leaned over and then when he tried to stand his hip dislocated and he fell. He was brought to the ED and orthopedic surgery was consulted.   Objective: Vital signs in last 24 hours: Temp:  [98 F (36.7 C)-99.4 F (37.4 C)] 99.4 F (37.4 C) (06/27 1557) Pulse Rate:  [110-116] 114 (06/27 1557) Resp:  [16-29] 21 (06/27 1557) BP: (123-175)/(77-102) 123/102 (06/27 1557) SpO2:  [96 %-100 %] 100 % (06/27 1557) Weight:  [101 kg] 101 kg (06/27 1335)     Physical Exam General appearance: alert and no distress Left hip -- Mod TTP   Assessment/Plan: Left hip dislocation -- Unable to reduce in ED. Plan CR in OR tomorrow with Dr. Cristy.    Jonathon DOROTHA Ned, PA-C Orthopedic Surgery [phone] 01/18/2024

## 2024-01-18 NOTE — ED Notes (Signed)
 Received report from previous RN. Pt resting, awaiting sedation. Denies pain or needs.

## 2024-01-18 NOTE — H&P (View-Only) (Signed)
 Patient ID: Jonathon Anderson, male   DOB: 25-Sep-1955, 68 y.o.   MRN: [id number]   LOS: 0 days   Subjective: Pt states he had leaned over and then when he tried to stand his hip dislocated and he fell. He was brought to the ED and orthopedic surgery was consulted.   Objective: Vital signs in last 24 hours: Temp:  [98 F (36.7 C)-99.4 F (37.4 C)] 99.4 F (37.4 C) (06/27 1557) Pulse Rate:  [110-116] 114 (06/27 1557) Resp:  [16-29] 21 (06/27 1557) BP: (123-175)/(77-102) 123/102 (06/27 1557) SpO2:  [96 %-100 %] 100 % (06/27 1557) Weight:  [101 kg] 101 kg (06/27 1335)     Physical Exam General appearance: alert and no distress Left hip -- Mod TTP   Assessment/Plan: Left hip dislocation -- Unable to reduce in ED. Plan CR in OR tomorrow with Dr. Cristy.    Jonathon DOROTHA Ned, PA-C Orthopedic Surgery [phone] 01/18/2024

## 2024-01-18 NOTE — Discharge Instructions (Signed)
 Thank you for allowing us  to be part of your care. You were hospitalized for 5 days.  We treated you for pain management of L Hip from dislocation and post operative care with medications acetaminophen , hydromorphone , and oxycodone .  We also treated you for acute on chronic anemia with IV/oral iron and IV/oral Folate.  See the changes in your medications and management of your chronic conditions below:  *For your Dislocation of Internal Hip prothesis/Post Operative open reduction and internal fixation.  -We have STARTED you on these following medications:  -Acetaminophen  1,000 every 6 hours  -Oxycodone  10 mg tablet AS NEEDED every 8 hours              -Cefadroxil  (antibiotic) 1 capsule every 12 hours per Dr. Edna   -Miralax  packet 2 times daily until bowel movement             *For your Anemia  -We have STARTED you on these following medications:  -ferrous sulfate tablet 325 mg daily with breakfast   -folic acid 1 mg daily              - We have also scheduled follow up in our clinic 1 week from today. Please show up during open clinic hours starting at 8:30 to have your CBC (blood labs) checked.   *For your Hypertension and Tachycardia  -We have CONTINUED your home medication of             -Amlodipine  10 mg daily   -STOP taking the following medication until you speak with your primary care doctor            -Metoprolol  100 mg twice daily   -We have CHANGED the following medications to:              -Metoprolol . Take 75 mg every 12 hours.   *For your Cough -Continue the Mucinex until 02/01/2024 -Follow up with your Primary care doctor   Please see [facility] in 7 days for Complete Blood Count monitoring. The Clinic address is: [address], [place], [place] [address]   FOLLOW UP APPOINTMENTS: Please follow up with your primary care provider.   Due to the acute on chronic anemia and your last colonoscopy being >20 years ago, we also  encourage you to follow up with your GI doctor.   Please call your PCP or our clinic if you have any questions or concerns, we may be able to help and keep you from a long and expensive emergency room wait. Our clinic and after hours phone number is [phone]. The best time to call is Monday through Friday 9 am to 4 pm but there is always someone available 24/7 if you have an emergency. If you need medication refills please notify your pharmacy one week in advance and they will send us  a request.   We are glad you are feeling better,  Jonathon Anderson Internal Medicine Inpatient Teaching Service at [facility]       INSTRUCTIONS AFTER JOINT REPLACEMENT   Remove items at home which could result in a fall. This includes throw rugs or furniture in walking pathways ICE to the affected joint every three hours while awake for 30 minutes at a time, for at least the first 3-5 days, and then as needed for pain and swelling.  Continue to use ice for pain and swelling. You may notice swelling that will progress down to the foot and ankle.  This is  normal after surgery.  Elevate your leg when you are not up walking on it.   Continue to use the breathing machine you got in the hospital (incentive spirometer) which will help keep your temperature down.  It is common for your temperature to cycle up and down following surgery, especially at night when you are not up moving around and exerting yourself.  The breathing machine keeps your lungs expanded and your temperature down.   DIET:  As you were doing prior to hospitalization, we recommend a well-balanced diet.  DRESSING / WOUND CARE / SHOWERING  Keep the surgical dressing until follow up.  The dressing is water  proof, so you can shower without any extra covering.  IF THE DRESSING FALLS OFF or the wound gets wet inside, change the dressing with sterile gauze.  Please use good hand washing techniques before changing the dressing.  Do not use any lotions or  creams on the incision until instructed by your surgeon.    ACTIVITY  Increase activity slowly as tolerated, but follow the weight bearing instructions below.   No driving for 6 weeks or until further direction given by your physician.  You cannot drive while taking narcotics.  No lifting or carrying greater than 10 lbs. until further directed by your surgeon. Avoid periods of inactivity such as sitting longer than an hour when not asleep. This helps prevent blood clots.  You may return to work once you are authorized by your doctor.     WEIGHT BEARING   Weight bearing as tolerated with assist device (walker, cane, etc) as directed, use it as long as suggested by your surgeon or therapist, typically at least 4-6 weeks. You need to wear your hip abduction brace at all times except for hygiene and changing clothes. You need to maintain strict posterior hip precautions after surgery as instructed by physical therapy.   EXERCISES  Results after joint replacement surgery are often greatly improved when you follow the exercise, range of motion and muscle strengthening exercises prescribed by your doctor. Safety measures are also important to protect the joint from further injury. Any time any of these exercises cause you to have increased pain or swelling, decrease what you are doing until you are comfortable again and then slowly increase them. If you have problems or questions, call your caregiver or physical therapist for advice.   Rehabilitation is important following a joint replacement. After just a few days of immobilization, the muscles of the leg can become weakened and shrink (atrophy).  These exercises are designed to build up the tone and strength of the thigh and leg muscles and to improve motion. Often times heat used for twenty to thirty minutes before working out will loosen up your tissues and help with improving the range of motion but do not use heat for the first two weeks  following surgery (sometimes heat can increase post-operative swelling).   These exercises can be done on a training (exercise) mat, on the floor, on a table or on a bed. Use whatever works the best and is most comfortable for you.    Use music or television while you are exercising so that the exercises are a pleasant break in your day. This will make your life better with the exercises acting as a break in your routine that you can look forward to.   Perform all exercises about fifteen times, three times per day or as directed.  You should exercise both the operative leg and the  other leg as well.  Exercises include:   Quad Sets - Tighten up the muscle on the front of the thigh (Quad) and hold for 5-10 seconds.   Straight Leg Raises - With your knee straight (if you were given a brace, keep it on), lift the leg to 60 degrees, hold for 3 seconds, and slowly lower the leg.  Perform this exercise against resistance later as your leg gets stronger.  Leg Slides: Lying on your back, slowly slide your foot toward your buttocks, bending your knee up off the floor (only go as far as is comfortable). Then slowly slide your foot back down until your leg is flat on the floor again.  Angel Wings: Lying on your back spread your legs to the side as far apart as you can without causing discomfort.  Hamstring Strength:  Lying on your back, push your heel against the floor with your leg straight by tightening up the muscles of your buttocks.  Repeat, but this time bend your knee to a comfortable angle, and push your heel against the floor.  You may put a pillow under the heel to make it more comfortable if necessary.   A rehabilitation program following joint replacement surgery can speed recovery and prevent re-injury in the future due to weakened muscles. Contact your doctor or a physical therapist for more information on knee rehabilitation.    CONSTIPATION  Constipation is defined medically as fewer than three  stools per week and severe constipation as less than one stool per week.  Even if you have a regular bowel pattern at home, your normal regimen is likely to be disrupted due to multiple reasons following surgery.  Combination of anesthesia, postoperative narcotics, change in appetite and fluid intake all can affect your bowels.   YOU MUST use at least one of the following options; they are listed in order of increasing strength to get the job done.  They are all available over the counter, and you may need to use some, POSSIBLY even all of these options:    Drink plenty of fluids (prune juice may be helpful) and high fiber foods Colace 100 mg by mouth twice a day  Senokot for constipation as directed and as needed Dulcolax (bisacodyl ), take with full glass of water   Miralax  (polyethylene glycol) once or twice a day as needed.  If you have tried all these things and are unable to have a bowel movement in the first 3-4 days after surgery call either your surgeon or your primary doctor.    If you experience loose stools or diarrhea, hold the medications until you stool forms back up.  If your symptoms do not get better within 1 week or if they get worse, check with your doctor.  If you experience the worst abdominal pain ever or develop nausea or vomiting, please contact the office immediately for further recommendations for treatment.   ITCHING:  If you experience itching with your medications, try taking only a single pain pill, or even half a pain pill at a time.  You can also use Benadryl  over the counter for itching or also to help with sleep.   TED HOSE STOCKINGS:  Use stockings on both legs until for at least 2 weeks or as directed by physician office. They may be removed at night for sleeping.  MEDICATIONS:  See your medication summary on the "After Visit Summary" that nursing will review with you.  You may have some home medications which will be  placed on hold until you complete the course  of blood thinner medication.  It is important for you to complete the blood thinner medication as prescribed.   Blood clot prevention (DVT Prophylaxis): After surgery you are at an increased risk for a blood clot. you were prescribed a blood thinner, Aspirin  81mg , to be taken twice daily for a total of 4 weeks from surgery to help reduce your risk of getting a blood clot. This will help prevent a blood clot. Signs of a pulmonary embolus (blood clot in the lungs) include sudden short of breath, feeling lightheaded or dizzy, chest pain with a deep breath, rapid pulse rapid breathing. Signs of a blood clot in your arms or legs include new unexplained swelling and cramping, warm, red or darkened skin around the painful area. Please call the office or 911 right away if these signs or symptoms develop.  PRECAUTIONS:  If you experience chest pain or shortness of breath - call 911 immediately for transfer to the hospital emergency department.   If you develop a fever greater that 101 F, purulent drainage from wound, increased redness or drainage from wound, foul odor from the wound/dressing, or calf pain - CONTACT YOUR SURGEON.                                                   FOLLOW-UP APPOINTMENTS:  If you do not already have a post-op appointment, please call the office for an appointment to be seen by your surgeon.  Guidelines for how soon to be seen are listed in your "After Visit Summary", but are typically between 2-3 weeks after surgery.    POST-OPERATIVE OPIOID TAPER INSTRUCTIONS: It is important to wean off of your opioid medication as soon as possible. If you do not need pain medication after your surgery it is ok to stop day one. Opioids include: Codeine, Hydrocodone (Norco, Vicodin), Oxycodone (Percocet, oxycontin ) and hydromorphone  amongst others.  Long term and even short term use of opiods can cause: Increased pain response Dependence Constipation Depression Respiratory depression And  more.  Withdrawal symptoms can include Flu like symptoms Nausea, vomiting And more Techniques to manage these symptoms Hydrate well Eat regular healthy meals Stay active Use relaxation techniques(deep breathing, meditating, yoga) Do Not substitute Alcohol  to help with tapering If you have been on opioids for less than two weeks and do not have pain than it is ok to stop all together.  Plan to wean off of opioids This plan should start within one week post op of your joint replacement. Maintain the same interval or time between taking each dose and first decrease the dose.  Cut the total daily intake of opioids by one tablet each day Next start to increase the time between doses. The last dose that should be eliminated is the evening dose.   MAKE SURE YOU:  Understand these instructions.  Get help right away if you are not doing well or get worse.    Thank you for letting us  be a part of your medical care team.  It is a privilege we respect greatly.  We hope these instructions will help you stay on track for a fast and full recovery!

## 2024-01-18 NOTE — H&P (Signed)
 Date: 01/18/2024               Patient Name:  Jonathon Anderson MRN: [id number]  DOB: 1955/09/02 Age / Sex: 68 y.o., male   PCP: Stephanie Charlene LITTIE, MD         Medical Service: Internal Medicine Teaching Service         Attending Physician: IMTSattending2025/2026: Dr. Reyes Fenton      First Contact: Dr. Harrie    Second Contact: Dr. Hadassah Kristy Ahr, MD          Pager Information: First Contact Pager: [phone]   Second Contact Pager: [phone]   SUBJECTIVE   Chief Complaint: L leg pain  History of Present Illness: Jonathon Anderson is a 68 y.o. 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 However, he was changing positions from sitting to standing this AM and felt a twisting and popping sensation at the site of his prior surgery this AM. He felt a constant, dull pain with was worse with movement. Denies back pain, losing sensation on his lower extremity, or claudication. Did not fall or his head. No recent infectious signs or symptoms.   Wound vac in place. Up until now was ambulating using a care. Denies chest pain, shortness of breath, abdominal pain, HA, or changes in mentation. He has been adherent with ASA 162 mg daily since discharge for DVT prophylaxis.    ED Course: Imaging revealed dislocation of left hip prosthesis. Ortho PA and Dr. Susannah attempted reduction under propofol  in the ED without success. Patient was then scheduled for closed reduction on 6/18. IMTS admission requested.  Past Medical History Past Medical History:  Diagnosis Date   Arthritis    Hyperlipidemia    Hypertension    Nicotine dependence    Stroke (HCC)    LIGHT STROKE - NUMBNESS AND TINGLING IN LEFT HAND - 10/2008 APPROX      Past Surgical History Past Surgical History:  Procedure Laterality Date   APPLICATION OF WOUND VAC Left 01/09/2024   Procedure: APPLICATION, WOUND VAC;  Surgeon: Edna Toribio LABOR, MD;  Location: [facility] ORS;  Service: Orthopedics;  Laterality: Left;   CONVERSION TO TOTAL HIP Left 01/09/2024   Procedure: CONVERSION TO TOTAL HIP;  Surgeon: Edna Toribio LABOR, MD;  Location: [facility];  Service: Orthopedics;  Laterality: Left;   FEMUR IM NAIL Left 07/27/2022   Procedure: REPAIR FEMUR WITH AUTOGRAFT/ RIA;  Surgeon: Celena Sharper, MD;  Location: [facility] OR;  Service: Orthopedics;  Laterality: Left;   HARDWARE REMOVAL Left 06/06/2022   Procedure: HARDWARE REMOVAL;  Surgeon: Celena Sharper, MD;  Location: [facility] OR;  Service: Orthopedics;  Laterality: Left;   HARDWARE REMOVAL Bilateral 07/27/2022   Procedure: REPAIR OF LEFT PROXIMAL FEMUR NONUNION, REMOVAL OF ANTIBIOTIC SPACER, REAMED INTERMEDULLARY ASPRIATE RIGHT FEMUR;  Surgeon: Celena Sharper, MD;  Location: [facility] OR;  Service: Orthopedics;  Laterality: Bilateral;   INTRAMEDULLARY (IM) NAIL INTERTROCHANTERIC Left 05/11/2021   Procedure: INTRAMEDULLARY (IM) NAIL INTERTROCHANTRIC OF LEFT SUBTHROCHANTRIC HIP FRACTURE;  Surgeon: Edie Norleen PARAS, MD;  Location: [facility] ORS;  Service: Orthopedics;  Laterality: Left;   ORIF FEMUR FRACTURE Left 06/06/2022   Procedure: OPEN REDUCTION INTERNAL FIXATION FEMORAL  PROXIMAL FRACTURE;  Surgeon: Celena Sharper, MD;  Location: [facility] OR;  Service: Orthopedics;  Laterality: Left;    Meds:  Metoprolol  tartrate 100 mg BID Amlodipine  10 mg daily Atorvastatin   Cyclobenzaprine  10 mg BID - muscle relaxant    Social:  Lives With: sister in [place], [place] 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 Able to drive. Manages hois own finances and your medications PCP:  Hamrick, Charlene CROME, MD  Substances: -Tobacco: 4-6 cigarretes per day; 28 years.  Attempting cessation -Alcohol : Social; has not drunk since the last hip surgery -Recreational Drug: None  Family History:  Family History  Problem Relation Age of Onset   Heart attack Mother    Hypertension Brother    Heart failure Paternal Grandfather      Allergies: Allergies as of 01/18/2024   (No Known Allergies)    Review of Systems: A complete ROS was negative except as per HPI.   OBJECTIVE:   Physical Exam: Vitals:   01/18/24 1557 01/18/24 1720  BP: (!) 123/102 (!) 162/77  Pulse: (!) 114 (!) 110  Resp: (!) 21 20  Temp: 99.4 F (37.4 C) 98.9 F (37.2 C)  SpO2: 100% 95%  Examined at 5:00 PM  Constitutional: well-appearing man laying in bed, in no acute distress HENT: normocephalic atraumatic, mucous membranes moist Eyes: conjunctiva non-erythematous, non icterous Neck: supple Cardiovascular: tachycardia, no m/r/g Pulmonary/Chest: normal work of breathing on room air, lungs clear to auscultation bilaterally Abdominal: soft, non-tender, protuberant without shifting dullness MSK: normal bulk and tone. Healing L hip insertion site covered by wound vac without surrounding erythema. Moderate edema compared to the R thigh. Compressible compartments in the L thigh and calf muscles. Popliteal, DP and PT pulses present. Warm and dry skin Neurological: Mildly somnolent after sedation but conversant with appropriate responses to conversation alert & oriented x 3, 5/5 strength in bilateral upper and lower extremities, normal gait Skin: warm and dry Psych: Pleasant mood and affect  Labs: CBC    Component Value Date/Time   WBC 9.9 01/14/2024 0320   RBC 2.87 (L) 01/14/2024 0320   HGB 8.7 (L) 01/14/2024 0320   HCT 27.3 (L) 01/14/2024 0320   PLT 292 01/14/2024 0320   MCV 95.1 01/14/2024 0320   MCH 30.3 01/14/2024 0320   MCHC 31.9 01/14/2024 0320   RDW 14.3 01/14/2024 0320   LYMPHSABS 1.4 01/02/2024 0933   MONOABS 0.3 01/02/2024 0933   EOSABS 0.2 01/02/2024 0933    BASOSABS 0.0 01/02/2024 0933     CMP     Component Value Date/Time   NA 134 (L) 01/14/2024 0320   K 3.6 01/14/2024 0320   CL 96 (L) 01/14/2024 0320   CO2 28 01/14/2024 0320   GLUCOSE 114 (H) 01/14/2024 0320   BUN 6 (L) 01/14/2024 0320   CREATININE 0.54 (L) 01/14/2024 0320   CALCIUM  8.2 (L) 01/14/2024 0320   PROT 7.7 01/02/2024 0933   ALBUMIN  3.5 01/02/2024 0933   AST 15 01/02/2024 0933   ALT 8 01/02/2024 0933   ALKPHOS 118 01/02/2024 0933   BILITOT 0.6 01/02/2024 0933   GFRNONAA >60 01/14/2024 0320    Imaging:  DG HIP PORT UNILAT WITH PELVIS 1V LEFT Result Date: 01/18/2024 CLINICAL DATA:  Status post reduction of left hip dislocation EXAM: DG HIP (WITH OR WITHOUT PELVIS) 1V PORT LEFT COMPARISON:  Same day. FINDINGS: There remains superior dislocation of femoral component left  hip arthroplasty. IMPRESSION: Continued superior dislocation of femoral component of left total hip arthroplasty. Electronically Signed   By: Lynwood Landy Raddle M.D.   On: 01/18/2024 16:07   DG Femur Min 2 Views Left Result Date: 01/18/2024 CLINICAL DATA:  Left hip pain. EXAM: LEFT FEMUR 2 VIEWS COMPARISON:  None Available. FINDINGS: Status post long-stem left total hip arthroplasty. Superior dislocation of left femoral component is noted. No acute fracture is noted. IMPRESSION: Superior dislocation of left total hip arthroplasty. Electronically Signed   By: Lynwood Landy Raddle M.D.   On: 01/18/2024 14:37   DG Pelvis 1-2 Views Result Date: 01/18/2024 CLINICAL DATA:  Left hip pain. EXAM: PELVIS - 1-2 VIEW COMPARISON:  January 09, 2024. FINDINGS: Status post long-stem left total hip arthroplasty. There is superior dislocation of left femoral prosthesis. IMPRESSION: Superior dislocation of left total hip arthroplasty. Electronically Signed   By: Lynwood Landy Raddle M.D.   On: 01/18/2024 14:35     EKG: personally reviewed my interpretation is sinus tachycardia. Prior EKG 06/2022  ASSESSMENT & PLAN:   Assessment & Plan by  Problem: Principal Problem:   Dislocated hip, left, initial encounter (HCC)   Jonathon Anderson is a 68 y.o. 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 Patient is to undergo closed reduction in the OR. - Scheduled acetaminophen  1000 mg TID  - PRN oxycodone  5 mg q4H daily - PRN IV dialudid 0.5 mg q2HR  HTN Mildly hypertensive. Currently on Will hold medications in anticipation for surgery and continued opiate pain regimen need - Restart meds as appropriate - Metoprolol  tartrate 100 mg BID - Amlodipine  10 mg daily  Hx TIA HLD - Holding ASA - Continue on Lipitor 20 mg daily  Sinus tachycardia Likely in the setting of pain. No chest pain. Did not take BB this AM. Has been on DVT prophylaxis. Improving with pain control - Treat pain as above   Anemia of blood loss S/p 1unit pRBC during last admission. Stable Hgb on admission - CTM  Best practice: Diet: NPO VTE: Heparin IVF: None,None Code: Full  Disposition planning: Prior to Admission Living Arrangement: Home, living sister Anticipated Discharge Location: Home Barriers to Discharge: surgery and clinical stability  Dispo: Admit patient to Observation with expected length of stay less than 2 midnights.  Signed: Elnora Ip, MD Internal Medicine Resident  01/18/2024, 4:36 PM  Please contact IM Residency On-Call Pager at: [phone] or [phone].

## 2024-01-18 NOTE — Progress Notes (Signed)
   01/18/24 2103  Assess: MEWS Score  Temp (!) 101.1 F (38.4 C)  BP (!) 142/76  MAP (mmHg) 95  Pulse Rate (!) 112  Resp 18  Level of Consciousness Alert  SpO2 100 %  O2 Device Room Air  Assess: MEWS Score  MEWS Temp 1  MEWS Systolic 0  MEWS Pulse 2  MEWS RR 0  MEWS LOC 0  MEWS Score 3  MEWS Score Color Yellow  Assess: if the MEWS score is Yellow or Red  Were vital signs accurate and taken at a resting state? Yes  Does the patient meet 2 or more of the SIRS criteria? No  MEWS guidelines implemented  Yes, yellow  Treat  MEWS Interventions Considered administering scheduled or prn medications/treatments as ordered  Take Vital Signs  Increase Vital Sign Frequency  Yellow: Q2hr x1, continue Q4hrs until patient remains green for 12hrs  Escalate  MEWS: Escalate Yellow: Discuss with charge nurse and consider notifying provider and/or RRT  Notify: Charge Nurse/RN  Name of Charge Nurse/RN Notified Mahima, RN  Assess: SIRS CRITERIA  SIRS Temperature  1  SIRS Respirations  0  SIRS Pulse 1  SIRS WBC 0  SIRS Score Sum  2

## 2024-01-18 NOTE — ED Provider Notes (Addendum)
 [place] EMERGENCY DEPARTMENT AT [facility] Provider Note   CSN: [id number] Arrival date & time: 01/18/24  1330     Patient presents with: Hip Pain   Jonathon Anderson is a 68 y.o. male.   Pt s/p left hip surgery 6/16 for IT fx, with conversation from left hip arthroplasty to proximal femur replacement, indicates this AM was pulling self up to standing position when felt pain in left hip, and sat back down. Pain constant, dull, non radiating, worse w attempting to move or stand.  No back pain or radicular pain. No numbness/weakness. Denies other pain or injury. Denies fall. No fevers.   The history is provided by the patient and medical records.  Hip Pain Pertinent negatives include no chest pain, no abdominal pain, no headaches and no shortness of breath.       Prior to Admission medications   Medication Sig Start Date End Date Taking? Authorizing Provider  acetaminophen  (TYLENOL ) 500 MG tablet Take 2 tablets (1,000 mg total) by mouth every 8 (eight) hours as needed. 01/09/24 02/08/24  Renae Bernarda HERO, PA-C  amLODipine  (NORVASC ) 10 MG tablet Take 10 mg by mouth in the morning. 10/23/23   [provider]  aspirin  EC 81 MG tablet Take 1 tablet (81 mg total) by mouth 2 (two) times daily for 28 days. Swallow whole. 01/10/24 02/07/24  Renae Bernarda HERO, PA-C  atorvastatin  (LIPITOR) 20 MG tablet Take 20 mg by mouth at bedtime. 11/28/20   [provider]  chlorhexidine  (HIBICLENS ) 4 % external liquid Apply 15 mLs (1 Application total) topically as directed for 30 doses. Use as directed daily for 5 days every other week for 6 weeks. 01/09/24   Cockerham, Alicia M, PA-C  cyclobenzaprine  (FLEXERIL ) 10 MG tablet Take 1 tablet (10 mg total) by mouth every 12 (twelve) hours as needed for muscle spasms. 01/09/24   Renae Bernarda HERO, PA-C  diclofenac  (VOLTAREN ) 75 MG EC tablet Take 1 tablet (75 mg total) by mouth 2 (two) times daily. 01/09/24   Renae Bernarda HERO, PA-C   magnesium  hydroxide (MILK OF MAGNESIA) 400 MG/5ML suspension Take 15 mLs by mouth daily as needed (constipation.).    [provider]  Menthol , Topical Analgesic, (BIOFREEZE EX) Apply 1 Application topically daily as needed (pain).    [provider]  metoprolol  tartrate (LOPRESSOR ) 100 MG tablet Take 100 mg by mouth 2 (two) times daily. 02/24/21   [provider]  mupirocin  ointment (BACTROBAN ) 2 % Place 1 Application into the nose 2 (two) times daily for 60 doses. Use as directed 2 times daily for 5 days every other week for 6 weeks. 01/09/24 02/08/24  Cockerham, Alicia M, PA-C  ondansetron  (ZOFRAN ) 4 MG tablet Take 1 tablet (4 mg total) by mouth every 8 (eight) hours as needed for up to 14 days for nausea or vomiting. 01/09/24 01/23/24  Renae Bernarda HERO, PA-C    Allergies: Patient has no known allergies.    Review of Systems  Constitutional:  Negative for fever.  Respiratory:  Negative for shortness of breath.   Cardiovascular:  Negative for chest pain.  Gastrointestinal:  Negative for abdominal pain, nausea and vomiting.  Genitourinary:  Negative for flank pain.  Musculoskeletal:  Negative for back pain and neck pain.  Neurological:  Negative for headaches.    Updated Vital Signs BP (!) 165/89   Pulse (!) 115   Temp 98 F (36.7 C) (Oral)   Resp 18   Ht 1.727 m (5'  8)   Wt 101 kg   SpO2 96%   BMI 33.86 kg/m   Physical Exam Vitals and nursing note reviewed.  Constitutional:      Appearance: Normal appearance. He is well-developed.  HENT:     Head: Atraumatic.     Nose: Nose normal.     Mouth/Throat:     Mouth: Mucous membranes are moist.   Eyes:     General: No scleral icterus.    Conjunctiva/sclera: Conjunctivae normal.   Neck:     Trachea: No tracheal deviation.   Cardiovascular:     Rate and Rhythm: Normal rate and regular rhythm.     Pulses: Normal pulses.     Heart sounds: Normal heart sounds. No murmur heard.    No friction rub. No  gallop.  Pulmonary:     Effort: Pulmonary effort is normal. No accessory muscle usage or respiratory distress.     Breath sounds: Normal breath sounds.  Abdominal:     General: Bowel sounds are normal. There is no distension.     Palpations: Abdomen is soft.     Tenderness: There is no abdominal tenderness.   Musculoskeletal:        General: No swelling.     Cervical back: Normal range of motion and neck supple. No rigidity.     Comments: Healing left hip surgical wound without sign of infection. Drain in place but not attached to bulb. Moderate swelling to left thigh, compartments of LLE are soft, not tense, not woody and no uncontrolled pain. Distal pulses palp. No other focal bony tenderness or pain on bilateral extremity exam.    Skin:    General: Skin is warm and dry.     Findings: No rash.   Neurological:     Mental Status: He is alert.     Comments: Alert, speech clear. LLE is nvi w intact motor/sens fxn.   Psychiatric:        Mood and Affect: Mood normal.     (all labs ordered are listed, but only abnormal results are displayed) Labs Reviewed - No data to display  EKG: None  Radiology: DG Femur Min 2 Views Left Result Date: 01/18/2024 CLINICAL DATA:  Left hip pain. EXAM: LEFT FEMUR 2 VIEWS COMPARISON:  None Available. FINDINGS: Status post long-stem left total hip arthroplasty. Superior dislocation of left femoral component is noted. No acute fracture is noted. IMPRESSION: Superior dislocation of left total hip arthroplasty. Electronically Signed   By: Lynwood Landy Raddle M.D.   On: 01/18/2024 14:37   DG Pelvis 1-2 Views Result Date: 01/18/2024 CLINICAL DATA:  Left hip pain. EXAM: PELVIS - 1-2 VIEW COMPARISON:  January 09, 2024. FINDINGS: Status post long-stem left total hip arthroplasty. There is superior dislocation of left femoral prosthesis. IMPRESSION: Superior dislocation of left total hip arthroplasty. Electronically Signed   By: Lynwood Landy Raddle M.D.   On: 01/18/2024  14:35     Procedures   Medications Ordered in the ED  propofol  (DIPRIVAN ) 10 mg/mL bolus/IV push 50.5 mg (has no administration in time range)  PHENYLephrine  80 mcg/ml in normal saline Adult IV Push Syringe (For Blood Pressure Support) (has no administration in time range)  propofol  (DIPRIVAN ) 10 mg/mL bolus/IV push (30 mg Intravenous Given 01/18/24 1551)                                    Medical Decision Making  Problems Addressed: Dislocation of internal left hip prosthesis, initial encounter [facility]): acute illness or injury that poses a threat to life or bodily functions Elevated blood pressure reading: acute illness or injury Essential hypertension: chronic illness or injury with exacerbation, progression, or side effects of treatment that poses a threat to life or bodily functions  Amount and/or Complexity of Data Reviewed External Data Reviewed: notes. Radiology: ordered and independent interpretation performed. Decision-making details documented in ED Course.  Risk Prescription drug management. Decision regarding hospitalization.   Iv ns. Continuous pulse ox and cardiac monitoring.  Imaging ordered.   Differential diagnosis includes hip fx, hip dislocation, femur fx, etc. Dispo decision including potential need for admission considered - will get imaging and reassess.   Reviewed nursing notes and prior charts for additional history. External reports reviewed.   Morphine  iv.   Cardiac monitor: sinus rhythm, rate 90.  Xrays reviewed/interpreted by me - dislocation left hip prosthesis - as only ~ 1 week post op from hip surgery, and to address drain issue - orthopedics consulted -  CHRISTELLA Purchase indicates has spoken with Dr CHRISTELLA and okay for us  to sedate/reduce in ED.   1530, signed out to Dr Albertina to proceed with procedural sedation and reduction of hip, recheck pt, and dispo appropriately. Dr Albertina to do procedural sedation, ortho to reduce and address broken drain tube issue.          Final diagnoses:  Dislocation of internal left hip prosthesis, initial encounter (HCC)  Elevated blood pressure reading  Essential hypertension    ED Discharge Orders     None          Bernard Drivers, MD 01/18/24 1600

## 2024-01-18 NOTE — ED Notes (Signed)
 Pt resting, awakens easily to voice and answers questions appropriately. Remains on the monitor. No distress noted.

## 2024-01-18 NOTE — ED Notes (Signed)
 Pt placed on the monitor. RT made aware of sedation. Suction available along with a bag/NRB. Bedside co2 on as well.

## 2024-01-18 NOTE — ED Notes (Signed)
 Admit provider at bedside chatting with patient.

## 2024-01-18 NOTE — ED Triage Notes (Signed)
 Pt the ed form ortho office with a CC of hip pain. Ortho confirmed pt has broken his left hip. Pt had a hip replacement on Tuesday. Pt relays pain started when he stood up from computer chair.

## 2024-01-18 NOTE — ED Notes (Signed)
 Spoke with the nurse on the floor. Pt to be transferred up.

## 2024-01-18 NOTE — Progress Notes (Signed)
 Patient arrived from [facility]. Vitals obtained. Yellow MEWS. With fever and high HR; CN aware. 10/10 pain on left hip. PRN meds given. No skin issues besides previous left hip surgical incision.

## 2024-01-19 ENCOUNTER — Observation Stay (HOSPITAL_COMMUNITY): Admitting: Anesthesiology

## 2024-01-19 ENCOUNTER — Observation Stay (HOSPITAL_COMMUNITY)

## 2024-01-19 ENCOUNTER — Encounter (HOSPITAL_COMMUNITY): Payer: Self-pay | Admitting: Infectious Diseases

## 2024-01-19 ENCOUNTER — Encounter (HOSPITAL_COMMUNITY)
Admission: EM | Disposition: A | Discharge status: Home / Self Care | Payer: Self-pay | Source: Ambulatory Visit | Attending: Internal Medicine

## 2024-01-19 DIAGNOSIS — D62 Acute posthemorrhagic anemia: Secondary | ICD-10-CM | POA: Diagnosis present

## 2024-01-19 DIAGNOSIS — E538 Deficiency of other specified B group vitamins: Secondary | ICD-10-CM | POA: Diagnosis present

## 2024-01-19 DIAGNOSIS — E876 Hypokalemia: Secondary | ICD-10-CM | POA: Diagnosis present

## 2024-01-19 DIAGNOSIS — T84021A Dislocation of internal left hip prosthesis, initial encounter: Secondary | ICD-10-CM

## 2024-01-19 DIAGNOSIS — J449 Chronic obstructive pulmonary disease, unspecified: Secondary | ICD-10-CM | POA: Diagnosis present

## 2024-01-19 DIAGNOSIS — Z8249 Family history of ischemic heart disease and other diseases of the circulatory system: Secondary | ICD-10-CM | POA: Diagnosis not present

## 2024-01-19 DIAGNOSIS — D539 Nutritional anemia, unspecified: Secondary | ICD-10-CM | POA: Diagnosis present

## 2024-01-19 DIAGNOSIS — K5900 Constipation, unspecified: Secondary | ICD-10-CM | POA: Diagnosis present

## 2024-01-19 DIAGNOSIS — S73005A Unspecified dislocation of left hip, initial encounter: Secondary | ICD-10-CM | POA: Diagnosis not present

## 2024-01-19 DIAGNOSIS — Y792 Prosthetic and other implants, materials and accessory orthopedic devices associated with adverse incidents: Secondary | ICD-10-CM | POA: Diagnosis present

## 2024-01-19 DIAGNOSIS — D638 Anemia in other chronic diseases classified elsewhere: Secondary | ICD-10-CM | POA: Diagnosis present

## 2024-01-19 DIAGNOSIS — E785 Hyperlipidemia, unspecified: Secondary | ICD-10-CM | POA: Diagnosis present

## 2024-01-19 DIAGNOSIS — Z87891 Personal history of nicotine dependence: Secondary | ICD-10-CM | POA: Diagnosis not present

## 2024-01-19 DIAGNOSIS — R030 Elevated blood-pressure reading, without diagnosis of hypertension: Secondary | ICD-10-CM | POA: Diagnosis present

## 2024-01-19 DIAGNOSIS — Z79899 Other long term (current) drug therapy: Secondary | ICD-10-CM | POA: Diagnosis not present

## 2024-01-19 DIAGNOSIS — D72829 Elevated white blood cell count, unspecified: Secondary | ICD-10-CM | POA: Diagnosis present

## 2024-01-19 DIAGNOSIS — E782 Mixed hyperlipidemia: Secondary | ICD-10-CM | POA: Diagnosis not present

## 2024-01-19 DIAGNOSIS — R000 Tachycardia, unspecified: Secondary | ICD-10-CM | POA: Diagnosis present

## 2024-01-19 DIAGNOSIS — I679 Cerebrovascular disease, unspecified: Secondary | ICD-10-CM

## 2024-01-19 DIAGNOSIS — I10 Essential (primary) hypertension: Secondary | ICD-10-CM

## 2024-01-19 DIAGNOSIS — Z8673 Personal history of transient ischemic attack (TIA), and cerebral infarction without residual deficits: Secondary | ICD-10-CM | POA: Diagnosis not present

## 2024-01-19 DIAGNOSIS — M1990 Unspecified osteoarthritis, unspecified site: Secondary | ICD-10-CM | POA: Diagnosis present

## 2024-01-19 HISTORY — PX: HIP CLOSED REDUCTION: SHX983

## 2024-01-19 LAB — COMPREHENSIVE METABOLIC PANEL WITH GFR
ALT: 12 U/L (ref 0–44)
AST: 21 U/L (ref 15–41)
Albumin: 2.5 g/dL — ABNORMAL LOW (ref 3.5–5.0)
Alkaline Phosphatase: 94 U/L (ref 38–126)
Anion gap: 7 (ref 5–15)
BUN: <5 mg/dL — ABNORMAL LOW (ref 8–23)
CO2: 30 mmol/L (ref 22–32)
Calcium: 8.2 mg/dL — ABNORMAL LOW (ref 8.9–10.3)
Chloride: 96 mmol/L — ABNORMAL LOW (ref 98–111)
Creatinine, Ser: 0.66 mg/dL (ref 0.61–1.24)
GFR, Estimated: >60 mL/min (ref >60)
Glucose, Bld: 121 mg/dL — ABNORMAL HIGH (ref 70–99)
Potassium: 3.2 mmol/L — ABNORMAL LOW (ref 3.5–5.1)
Sodium: 133 mmol/L — ABNORMAL LOW (ref 135–145)
Total Bilirubin: 0.5 mg/dL (ref 0.0–1.2)
Total Protein: 6.2 g/dL — ABNORMAL LOW (ref 6.5–8.1)

## 2024-01-19 LAB — CBC
HCT: 26.6 % — ABNORMAL LOW (ref 39.0–52.0)
Hemoglobin: 8.6 g/dL — ABNORMAL LOW (ref 13.0–17.0)
MCH: 29.7 pg (ref 26.0–34.0)
MCHC: 32.3 g/dL (ref 30.0–36.0)
MCV: 91.7 fL (ref 80.0–100.0)
Platelets: 355 10*3/uL (ref 150–400)
RBC: 2.9 MIL/uL — ABNORMAL LOW (ref 4.22–5.81)
RDW: 14.7 % (ref 11.5–15.5)
WBC: 11.1 10*3/uL — ABNORMAL HIGH (ref 4.0–10.5)
nRBC: 0 % (ref 0.0–0.2)

## 2024-01-19 LAB — HIV ANTIBODY (ROUTINE TESTING W REFLEX): HIV Screen 4th Generation wRfx: NONREACTIVE

## 2024-01-19 SURGERY — CLOSED REDUCTION, HIP
Anesthesia: General | Site: Hip | Laterality: Left

## 2024-01-19 MED ORDER — METHOCARBAMOL 1000 MG/10ML IJ SOLN: 500.0000 mg | Freq: Four times a day (QID) | INTRAMUSCULAR | Status: DC | PRN

## 2024-01-19 MED ORDER — FENTANYL CITRATE (PF) 100 MCG/2ML IJ SOLN
25.0000 ug | INTRAMUSCULAR | Status: DC | PRN
  Administered 2024-01-19 (×2): 50 ug via INTRAVENOUS

## 2024-01-19 MED ORDER — METHOCARBAMOL 500 MG PO TABS
500.0000 mg | ORAL_TABLET | Freq: Four times a day (QID) | ORAL | Status: DC | PRN
  Administered 2024-01-19 – 2024-01-24 (×8): 500 mg via ORAL
  Filled 2024-01-19 (×8): qty 1

## 2024-01-19 MED ORDER — POVIDONE-IODINE 10 % EX SWAB: 2.0000 | Freq: Once | CUTANEOUS | Status: DC

## 2024-01-19 MED ORDER — CHLORHEXIDINE GLUCONATE 0.12 % MT SOLN
15.0000 mL | Freq: Once | OROMUCOSAL | Status: AC
  Administered 2024-01-19: 15 mL via OROMUCOSAL

## 2024-01-19 MED ORDER — DOCUSATE SODIUM 100 MG PO CAPS
100.0000 mg | ORAL_CAPSULE | Freq: Two times a day (BID) | ORAL | Status: DC
  Administered 2024-01-19 – 2024-01-20 (×3): 100 mg via ORAL
  Filled 2024-01-19 (×3): qty 1

## 2024-01-19 MED ORDER — METOCLOPRAMIDE HCL 5 MG PO TABS: 5.0000 mg | ORAL_TABLET | Freq: Three times a day (TID) | ORAL | Status: DC | PRN

## 2024-01-19 MED ORDER — SUCCINYLCHOLINE CHLORIDE 200 MG/10ML IV SOSY
PREFILLED_SYRINGE | INTRAVENOUS | Status: DC | PRN
  Administered 2024-01-19: 50 mg via INTRAVENOUS

## 2024-01-19 MED ORDER — CHLORHEXIDINE GLUCONATE 4 % EX SOLN
60.0000 mL | Freq: Once | CUTANEOUS | Status: AC
  Administered 2024-01-19: 4 via TOPICAL
  Filled 2024-01-19: qty 15

## 2024-01-19 MED ORDER — LACTATED RINGERS IV SOLN: INTRAVENOUS | Status: DC

## 2024-01-19 MED ORDER — FENTANYL CITRATE (PF) 100 MCG/2ML IJ SOLN
INTRAMUSCULAR | Status: AC
Start: 2024-01-19 — End: 2024-01-19
  Filled 2024-01-19: qty 2

## 2024-01-19 MED ORDER — PROPOFOL 10 MG/ML IV BOLUS
INTRAVENOUS | Status: DC | PRN
  Administered 2024-01-19: 50 mg via INTRAVENOUS
  Administered 2024-01-19: 100 mg via INTRAVENOUS
  Administered 2024-01-19: 30 mg via INTRAVENOUS
  Administered 2024-01-19: 50 mg via INTRAVENOUS

## 2024-01-19 MED ORDER — PROPOFOL 10 MG/ML IV BOLUS
INTRAVENOUS | Status: AC
  Filled 2024-01-19: qty 20

## 2024-01-19 MED ORDER — HYDROMORPHONE HCL 1 MG/ML IJ SOLN
0.5000 mg | INTRAMUSCULAR | Status: DC | PRN
  Administered 2024-01-20: 1 mg via INTRAVENOUS
  Administered 2024-01-21: 0.5 mg via INTRAVENOUS
  Administered 2024-01-22: 1 mg via INTRAVENOUS
  Filled 2024-01-19 (×3): qty 1

## 2024-01-19 MED ORDER — ACETAMINOPHEN 500 MG PO TABS
1000.0000 mg | ORAL_TABLET | Freq: Four times a day (QID) | ORAL | Status: AC
  Administered 2024-01-19 – 2024-01-20 (×3): 1000 mg via ORAL
  Filled 2024-01-19 (×3): qty 2

## 2024-01-19 MED ORDER — METOCLOPRAMIDE HCL 5 MG/ML IJ SOLN: 5.0000 mg | Freq: Three times a day (TID) | INTRAMUSCULAR | Status: DC | PRN

## 2024-01-19 MED ORDER — BISACODYL 5 MG PO TBEC: 5.0000 mg | DELAYED_RELEASE_TABLET | Freq: Every day | ORAL | Status: DC | PRN

## 2024-01-19 MED ORDER — ONDANSETRON HCL 4 MG PO TABS: 4.0000 mg | ORAL_TABLET | Freq: Four times a day (QID) | ORAL | Status: DC | PRN

## 2024-01-19 MED ORDER — DROPERIDOL 2.5 MG/ML IJ SOLN: 0.6250 mg | Freq: Once | INTRAMUSCULAR | Status: DC | PRN

## 2024-01-19 MED ORDER — PROPOFOL 1000 MG/100ML IV EMUL
INTRAVENOUS | Status: AC
  Filled 2024-01-19: qty 100

## 2024-01-19 MED ORDER — ONDANSETRON HCL 4 MG/2ML IJ SOLN: 4.0000 mg | Freq: Four times a day (QID) | INTRAMUSCULAR | Status: DC | PRN

## 2024-01-19 MED ORDER — ORAL CARE MOUTH RINSE: 15.0000 mL | Freq: Once | OROMUCOSAL | Status: AC

## 2024-01-19 MED ORDER — CHLORHEXIDINE GLUCONATE 0.12 % MT SOLN
OROMUCOSAL | Status: AC
  Filled 2024-01-19: qty 15

## 2024-01-19 MED ORDER — OXYCODONE HCL 5 MG PO TABS
10.0000 mg | ORAL_TABLET | ORAL | Status: DC | PRN
  Administered 2024-01-19 – 2024-01-20 (×3): 15 mg via ORAL
  Administered 2024-01-20: 10 mg via ORAL
  Filled 2024-01-19: qty 2
  Filled 2024-01-19 (×3): qty 3

## 2024-01-19 MED ORDER — OXYCODONE HCL 5 MG PO TABS
5.0000 mg | ORAL_TABLET | ORAL | Status: DC | PRN
  Administered 2024-01-20 – 2024-01-22 (×6): 10 mg via ORAL
  Filled 2024-01-19 (×6): qty 2

## 2024-01-19 MED ORDER — LIDOCAINE 2% (20 MG/ML) 5 ML SYRINGE
INTRAMUSCULAR | Status: DC | PRN
  Administered 2024-01-19: 100 mg via INTRAVENOUS

## 2024-01-19 NOTE — Anesthesia Preprocedure Evaluation (Addendum)
 Anesthesia Evaluation  Patient identified by MRN, date of birth, ID band Patient awake    Reviewed: Allergy & Precautions, NPO status , Patient's Chart, lab work & pertinent test results  Airway Mallampati: II  TM Distance: >3 FB Neck ROM: Full    Dental  (+) Edentulous Upper, Edentulous Lower   Pulmonary neg pulmonary ROS, former smoker   Pulmonary exam normal        Cardiovascular hypertension, Pt. on medications and Pt. on home beta blockers  Rhythm:Regular Rate:Normal     Neuro/Psych CVA, No Residual Symptoms  negative psych ROS   GI/Hepatic negative GI ROS, Neg liver ROS,,,  Endo/Other  negative endocrine ROS    Renal/GU negative Renal ROS  negative genitourinary   Musculoskeletal  (+) Arthritis , Osteoarthritis,    Abdominal Normal abdominal exam  (+)   Peds  Hematology negative hematology ROS (+)   Anesthesia Other Findings   Reproductive/Obstetrics                             Anesthesia Physical Anesthesia Plan  ASA: 3  Anesthesia Plan: General   Post-op Pain Management:    Induction: Intravenous  PONV Risk Score and Plan: 2 and Treatment may vary due to age or medical condition, Ondansetron  and Dexamethasone   Airway Management Planned: Mask  Additional Equipment: None  Intra-op Plan:   Post-operative Plan:   Informed Consent: I have reviewed the patients History and Physical, chart, labs and discussed the procedure including the risks, benefits and alternatives for the proposed anesthesia with the patient or authorized representative who has indicated his/her understanding and acceptance.     Dental advisory given  Plan Discussed with: CRNA  Anesthesia Plan Comments:        Anesthesia Quick Evaluation

## 2024-01-19 NOTE — Interval H&P Note (Signed)
 All questions answered, patient wants to proceed with procedure. ? ?

## 2024-01-19 NOTE — Care Management Obs Status (Signed)
 MEDICARE OBSERVATION STATUS NOTIFICATION   Patient Details  Name: Jonathon Anderson MRN: [id number] Date of Birth: Feb 10, 1956   Medicare Observation Status Notification Given:  Yes    Robynn Eileen Hoose, RN 01/19/2024, 10:09 AM

## 2024-01-19 NOTE — ED Provider Notes (Signed)
 Physical Exam  BP (!) 153/69 (BP Location: Left Arm)   Pulse (!) 116   Temp 99.3 F (37.4 C)   Resp 18   Ht 5' 8 (1.727 m)   Wt 101 kg   SpO2 98%   BMI 33.86 kg/m   Physical Exam Constitutional:      General: He is not in acute distress.    Appearance: Normal appearance.  HENT:     Head: Normocephalic and atraumatic.     Nose: No congestion or rhinorrhea.   Eyes:     General:        Right eye: No discharge.        Left eye: No discharge.     Extraocular Movements: Extraocular movements intact.     Pupils: Pupils are equal, round, and reactive to light.    Cardiovascular:     Rate and Rhythm: Normal rate and regular rhythm.     Heart sounds: No murmur heard. Pulmonary:     Effort: No respiratory distress.     Breath sounds: No wheezing or rales.  Abdominal:     General: There is no distension.     Tenderness: There is no abdominal tenderness.   Musculoskeletal:        General: Swelling, tenderness and deformity present. Normal range of motion.     Cervical back: Normal range of motion.   Skin:    General: Skin is warm and dry.   Neurological:     General: No focal deficit present.     Mental Status: He is alert.     Procedures  .Sedation  Date/Time: 01/19/2024 1:03 AM  Performed by: Albertina Dixon, MD Authorized by: Albertina Dixon, MD   Consent:    Consent obtained:  Written   Consent given by:  Patient   Risks discussed:  Allergic reaction, dysrhythmia, inadequate sedation, nausea, prolonged hypoxia resulting in organ damage, prolonged sedation necessitating reversal, respiratory compromise necessitating ventilatory assistance and intubation and vomiting   Alternatives discussed:  Analgesia without sedation, anxiolysis and regional anesthesia Universal protocol:    Procedure explained and questions answered to patient or proxy's satisfaction: yes     Relevant documents present and verified: yes     Test results available: yes     Imaging studies  available: yes     Required blood products, implants, devices, and special equipment available: yes     Site/side marked: yes     Immediately prior to procedure, a time out was called: yes     Patient identity confirmed:  Verbally with patient Indications:    Procedure necessitating sedation performed by:  Physician performing sedation Pre-sedation assessment:    Time since last food or drink:  0800   ASA classification: class 1 - normal, healthy patient     Mouth opening:  3 or more finger widths   Thyromental distance:  4 finger widths   Mallampati score:  I - soft palate, uvula, fauces, pillars visible   Neck mobility: normal     Pre-sedation assessments completed and reviewed: airway patency, cardiovascular function, hydration status, mental status, nausea/vomiting, pain level, respiratory function and temperature   A pre-sedation assessment was completed prior to the start of the procedure Immediate pre-procedure details:    Reassessment: Patient reassessed immediately prior to procedure     Reviewed: vital signs, relevant labs/tests and NPO status     Verified: bag valve mask available, emergency equipment available, intubation equipment available, IV patency confirmed, oxygen available and suction available  Procedure details (see MAR for exact dosages):    Preoxygenation:  Nasal cannula   Sedation:  Propofol    Intended level of sedation: deep   Intra-procedure monitoring:  Blood pressure monitoring, cardiac monitor, continuous pulse oximetry, frequent LOC assessments, frequent vital sign checks and continuous capnometry   Intra-procedure events: none     Total Provider sedation time (minutes):  15 Post-procedure details:   A post-sedation assessment was completed following the completion of the procedure.   Attendance: Constant attendance by certified staff until patient recovered     Recovery: Patient returned to pre-procedure baseline     Post-sedation assessments completed  and reviewed: airway patency, cardiovascular function, hydration status, mental status, nausea/vomiting, pain level, respiratory function and temperature     Patient is stable for discharge or admission: yes     Procedure completion:  Tolerated well, no immediate complications .Reduction of dislocation  Date/Time: 01/19/2024 1:03 AM  Performed by: Albertina Dixon, MD Authorized by: Albertina Dixon, MD  Consent: Written consent obtained Risks and benefits: risks, benefits and alternatives were discussed Consent given by: patient Patient understanding: patient states understanding of the procedure being performed Patient consent: the patient's understanding of the procedure matches consent given Procedure consent: procedure consent matches procedure scheduled Relevant documents: relevant documents present and verified Patient identity confirmed: arm band and hospital-assigned identification number Time out: Immediately prior to procedure a time out was called to verify the correct patient, procedure, equipment, support staff and site/side marked as required. Local anesthesia used: no  Anesthesia: Local anesthesia used: no Patient tolerance: patient tolerated the procedure well with no immediate complications Comments: Reduction of prosthetic hip dislocation attempted in the emergency department under propofol  sedation.  Relocation attempt unsuccessful.     ED Course / MDM    Medical Decision Making Amount and/or Complexity of Data Reviewed Radiology: ordered. ECG/medicine tests: ordered.  Risk Decision regarding hospitalization.   Patient received in handoff.  Prosthetic hip dislocation pending reduction attempt.  I performed a propofol  sedation with the physician assistant for orthopedics on-call Ozell Kenner and we were unfortunately unable to relocate the hip.  Orthopedics is recommending medical admission and operative relocation tomorrow morning.       Albertina Dixon, MD 01/19/24 [phone]

## 2024-01-19 NOTE — Anesthesia Procedure Notes (Signed)
 Date/Time: 01/19/2024 7:47 AM  Performed by: Mollie Olivia SAUNDERS, CRNAVentilation: Mask ventilation without difficulty Number of attempts: 1 Airway Equipment and Method: Oral airway Comments: Masked pt during case c 10 OAW, no lma ; no ett; no problems noted

## 2024-01-19 NOTE — Progress Notes (Signed)
 PT Cancellation Note  Patient Details Name: Jonathon Anderson MRN: [id number] DOB: 12/17/1955   Cancelled Treatment:    Reason Eval/Treat Not Completed: Medical issues which prohibited therapy.  Pt to OR today, MD unable to reduce hip and will need further follow up.  Patient on bedrest until Monday.  PT to follow up at that time. Thank you,   Gracee Ratterree M 01/19/2024, 8:48 AM

## 2024-01-19 NOTE — Progress Notes (Signed)
 OT Cancellation Note  Patient Details Name: Jonathon Anderson MRN: [id number] DOB: September 25, 1955   Cancelled Treatment:    Reason Eval/Treat Not Completed: Patient not medically ready (plan for OR today for L hip surgery. Will follow up for OT evaluation as appropriate.)  Hasson Gaspard K, OTD, OTR/L [facility] [phone]   [name] 01/19/2024, 7:07 AM

## 2024-01-19 NOTE — Anesthesia Postprocedure Evaluation (Signed)
 Anesthesia Post Note  Patient: Jonathon Anderson  Procedure(s) Performed: Attempted CLOSED REDUCTION, HIP (Left: Hip)     Patient location during evaluation: PACU Anesthesia Type: General Level of consciousness: awake and alert Pain management: pain level controlled Vital Signs Assessment: post-procedure vital signs reviewed and stable Respiratory status: spontaneous breathing, nonlabored ventilation, respiratory function stable and patient connected to nasal cannula oxygen Cardiovascular status: blood pressure returned to baseline and stable Postop Assessment: no apparent nausea or vomiting Anesthetic complications: no   No notable events documented.  Last Vitals:  Vitals:   01/19/24 1021 01/19/24 1357  BP: 105/71 123/73  Pulse: (!) 105 (!) 105  Resp: 18 18  Temp: 36.9 C 37.1 C  SpO2: 96%     Last Pain:  Vitals:   01/19/24 1357  TempSrc: Oral  PainSc:                  Cordella SQUIBB Dario Yono

## 2024-01-19 NOTE — Op Note (Signed)
 Orthopaedic Surgery Operative Note (CSN: [id number])  Jonathon Anderson  1955-07-30 Date of Surgery: 01/18/2024 - 01/19/2024   Diagnoses:  Left hip periprosthetic dislocation  Procedure: Left closed reduction attempt hip   Operative Finding Unfortunately patient was unable to be closed reduced.  I discussed this with my partner and his index recent surgeon Dr. Edna who plans on an open reduction and potential revision of implants or trochanteric fixation on Monday.  Post-operative plan: The patient will be bedrest.  The patient will be readmitted to the floor.  DVT prophylaxis Lovenox  40 mg/day until mobilizing and then consider transition in clinic to alternative medicines.   Pain control with PRN pain medication preferring oral medicines.  Follow-up plan to be determined  Post-Op Diagnosis: Same Surgeons:Primary: Cristy Bonner DASEN, MD Assistants: Location: [facility] OR ROOM 08 Anesthesia: General Antibiotics: None  Tourniquet time:  Estimated Blood Loss: 0 Complications: None Specimens: None Implants: * No implants in log *  Indications for Surgery:   Jonathon Anderson is a 68 y.o. male with previous proximal femoral replacement and unfortunately dislocation.  Patient failed reduction attempt at ER bedside.  Benefits and risks of operative and nonoperative management were discussed prior to surgery with patient/guardian(s) and informed consent form was completed.  Specific risks including infection, need for additional surgery, revision surgery amongst others.   Procedure:   The patient was identified properly. Informed consent was obtained and the surgical site was marked. The patient was taken up to suite where general anesthesia was induced.  The patient was positioned regular bed.  Timeout was called but there was no prep due to close nature procedure.  We had adequate anesthesia and muscle paralysis and attempted multiple tries at a closed reduction of the hip but failed.  We felt  that it was not in the patient's best interest to continue to try.  Patient was awoken taken back in stable condition.

## 2024-01-19 NOTE — Transfer of Care (Signed)
 Immediate Anesthesia Transfer of Care Note  Patient: Jonathon Anderson  Procedure(s) Performed: Attempted CLOSED REDUCTION, HIP (Left: Hip)  Patient Location: PACU  Anesthesia Type:General  Level of Consciousness: awake, alert , and oriented  Airway & Oxygen Therapy: Patient Spontanous Breathing and Patient connected to face mask oxygen  Post-op Assessment: Report given to RN and Post -op Vital signs reviewed and stable  Post vital signs: Reviewed and stable  Last Vitals:  Vitals Value Taken Time  BP 132/69 01/19/24 08:16  Temp 36.7 C 01/19/24 08:15  Pulse 109 01/19/24 08:23  Resp 19 01/19/24 08:23  SpO2 96 % 01/19/24 08:23  Vitals shown include unfiled device data.  Last Pain:  Vitals:   01/19/24 0815  TempSrc:   PainSc: 7          Complications: No notable events documented.

## 2024-01-19 NOTE — Progress Notes (Signed)
 HD#0 SUBJECTIVE:  Patient Summary: Jonathon Anderson is a 68 y.o. 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  Overnight Events and Interim History: NEO. He is relaxed this morning. Shares that his pain is mild. Has no specific concerns or requests.  Significant Hospital Events:  01/19/24 - attempt closed reduction in OR, but unable to reduce. Plan open reduction 6/30  OBJECTIVE:  Vital Signs: Vitals:   01/19/24 0845 01/19/24 0900 01/19/24 0915 01/19/24 1021  BP: (!) 148/81 122/84 137/81 105/71  Pulse: (!) 104 (!) 109 (!) 107 (!) 105  Resp: 14 17 16 18   Temp:   98 F (36.7 C) 98.5 F (36.9 C)  TempSrc:    Oral  SpO2: 90% 94% 96% 96%  Weight:      Height:       Supplemental O2: Room Air SpO2: 96 %  Filed Weights   01/18/24 1335 01/19/24 0733  Weight: 101 kg 101 kg     Intake/Output Summary (Last 24 hours) at 01/19/2024 1113 Last data filed at 01/19/2024 1024 Gross per 24 hour  Intake 770 ml  Output 900 ml  Net -130 ml   Net IO Since Admission: -130 mL [01/19/24 1113]  Physical Exam: Physical Exam Constitutional:      General: He is not in acute distress.    Appearance: He is not ill-appearing.   Cardiovascular:     Rate and Rhythm: Normal rate and regular rhythm.     Pulses: Normal pulses.  Pulmonary:     Effort: Pulmonary effort is normal.     Breath sounds: Normal breath sounds.  Abdominal:     General: Abdomen is flat.     Palpations: Abdomen is soft.     Tenderness: There is no abdominal tenderness.   Musculoskeletal:        General: Tenderness present.     Right lower leg: No edema.     Left lower leg: No edema.     Comments: Mild L hip tenderness. Distal  extremity warm, perfused, neurovasc intact.   Skin:    General: Skin is warm and dry.   Neurological:     Mental Status: He is alert.   Psychiatric:        Mood and Affect: Mood normal.        Behavior: Behavior normal.    Patient Lines/Drains/Airways Status     Active Line/Drains/Airways     Name Placement date Placement time Site Days   Peripheral IV 01/18/24 20 G Right Antecubital 01/18/24  1523  Antecubital  1   Wound 01/09/24 1412 Surgical Closed Surgical Incision Hip Left 01/09/24  1412  Hip  10            ASSESSMENT/PLAN:  Assessment: Principal Problem:   Dislocated hip, left, initial encounter (HCC)  Jonathon Anderson is a 68 y.o. 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  Plan: Dislocated L hip prosthesis S/p closed fracture of L fumer S/p L total hip arthroplasty Surgery following, thank you. On admission, attempted close reduction at the bedside under propofol  without success. He will head to the OR for closed reduction today. - Scheduled acetaminophen  1000 mg TID  - PRN oxycodone  5 mg q4H daily - PRN IV dialudid 0.5 mg q2HR  ADDN: closed reduction unsuccessful today, plan open reduction and potential revision on Monday.   HTN Will follow blood pressures after his procedure. With pain control he has not needed antihypertensives. - Restart meds as appropriate: - Metoprolol  tartrate 100 mg BID - Amlodipine  10 mg daily   Hx TIA HLD - Holding ASA - Continue on Lipitor 20 mg daily   Sinus tachycardia Persists but improving, likely in the setting of pain. No chest pain. Has been on DVT prophylaxis. Improving with pain control - Treat pain as above    Anemia of  blood loss S/p 1unit pRBC during last admission. Stable Hgb on admission - CTM  Best Practice: Diet: Regular diet IVF: None VTE: SCDs Start: 01/19/24 0933 heparin injection 5,000 Units Start: 01/18/24 1645 SCDs Start: 01/18/24 1633 Code: Full Pain Medicine: Tylenol , oxycodone , dilaudid  Bowel Regimen: Senna, miralax  Therapy Recs: Pending DISPO: Anticipated discharge TBD after surgery and recovery plan.  Signature: Lonni Africa, D.O.  Internal Medicine Resident, PGY-1 [facility]  Pager: # [phone]. 11:13 AM, 01/19/2024

## 2024-01-19 NOTE — Plan of Care (Signed)
  Problem: Nutrition: Goal: Adequate nutrition will be maintained Outcome: Progressing   Problem: Elimination: Goal: Will not experience complications related to bowel motility Outcome: Progressing   Problem: Pain Managment: Goal: General experience of comfort will improve and/or be controlled Outcome: Progressing   Problem: Clinical Measurements: Goal: Postoperative complications will be avoided or minimized Outcome: Progressing

## 2024-01-20 ENCOUNTER — Inpatient Hospital Stay (HOSPITAL_COMMUNITY)

## 2024-01-20 ENCOUNTER — Encounter (HOSPITAL_COMMUNITY): Payer: Self-pay | Admitting: Orthopaedic Surgery

## 2024-01-20 DIAGNOSIS — E876 Hypokalemia: Secondary | ICD-10-CM

## 2024-01-20 DIAGNOSIS — S73005A Unspecified dislocation of left hip, initial encounter: Secondary | ICD-10-CM

## 2024-01-20 LAB — BASIC METABOLIC PANEL WITH GFR
Anion gap: 11 (ref 5–15)
BUN: <5 mg/dL — ABNORMAL LOW (ref 8–23)
CO2: 28 mmol/L (ref 22–32)
Calcium: 8.1 mg/dL — ABNORMAL LOW (ref 8.9–10.3)
Chloride: 95 mmol/L — ABNORMAL LOW (ref 98–111)
Creatinine, Ser: 0.74 mg/dL (ref 0.61–1.24)
GFR, Estimated: >60 mL/min (ref >60)
Glucose, Bld: 126 mg/dL — ABNORMAL HIGH (ref 70–99)
Potassium: 3.1 mmol/L — ABNORMAL LOW (ref 3.5–5.1)
Sodium: 134 mmol/L — ABNORMAL LOW (ref 135–145)

## 2024-01-20 LAB — CBC
HCT: 26.3 % — ABNORMAL LOW (ref 39.0–52.0)
Hemoglobin: 8.3 g/dL — ABNORMAL LOW (ref 13.0–17.0)
MCH: 29.5 pg (ref 26.0–34.0)
MCHC: 31.6 g/dL (ref 30.0–36.0)
MCV: 93.6 fL (ref 80.0–100.0)
Platelets: 370 10*3/uL (ref 150–400)
RBC: 2.81 MIL/uL — ABNORMAL LOW (ref 4.22–5.81)
RDW: 14.9 % (ref 11.5–15.5)
WBC: 13 10*3/uL — ABNORMAL HIGH (ref 4.0–10.5)
nRBC: 0 % (ref 0.0–0.2)

## 2024-01-20 MED ORDER — ACETAMINOPHEN 500 MG PO TABS
1000.0000 mg | ORAL_TABLET | Freq: Four times a day (QID) | ORAL | Status: AC
  Administered 2024-01-20 – 2024-01-21 (×3): 1000 mg via ORAL
  Filled 2024-01-20 (×4): qty 2

## 2024-01-20 NOTE — Plan of Care (Signed)

## 2024-01-20 NOTE — Progress Notes (Signed)
 Patient seen this morning, unfortunately now 2 unsuccessful attempts at closed reduction, will need open reduction. He is 2 weeks s/p conversion intertroch nonunion to PFR. Patient overall in good spirits. Neurovascularly intact LLE.  Scheduled for surgery tomorrow, 6/30, at 3pm. Will obain CT for presurgical planning. NPO after midnight.

## 2024-01-20 NOTE — Progress Notes (Signed)
 HD#1 SUBJECTIVE:  Patient Summary: Jonathon Anderson is a 68 y.o. 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 Comfortable in bed. Denies chest pain, shortness of breath, nausea or vomiting. Pain is well controlled.   Significant Hospital Events:  01/19/24 - attempt closed reduction in OR, but unable to reduce.  6/30 Plan open reduction 6/30  OBJECTIVE:  Vital Signs: Vitals:   01/19/24 1357 01/19/24 2036 01/20/24 0500 01/20/24 0720  BP: 123/73 132/70 (!) 139/97 (!) 153/128  Pulse: (!) 105 (!) 102 (!) 109   Resp: 18 18 18 18   Temp: 98.7 F (37.1 C) 98 F (36.7 C) 98.4 F (36.9 C) (!) 97.4 F (36.3 C)  TempSrc: Oral Oral Oral   SpO2:  96% 96% 92%  Weight:      Height:       Supplemental O2: Room Air SpO2: 96 %  Filed Weights   01/18/24 1335 01/19/24 0733  Weight: 101 kg 101 kg     Intake/Output Summary (Last 24 hours) at 01/20/2024 1254 Last data filed at 01/20/2024 1100 Gross per 24 hour  Intake 240 ml  Output 400 ml  Net -160 ml   Net IO Since Admission: -290 mL [01/20/24 1254]  Physical Exam: General: Pleasant, well-appearing man laying in bed. No acute distress. CV: RRR. No murmurs, rubs, or gallops. No LE edema Pulmonary: Lungs CTAB. Normal effort. No wheezing or rales. Abdominal: Soft, nontender Extremities: Palpable radial and DP pulses on bilateral lower extremities. Moving L toes. Tenderness over prior surgical site Skin: Warm and dry. No obvious rash or lesions. Neuro: alert and oriented x3 Psych: Normal mood and affect  Patient Lines/Drains/Airways Status      Active Line/Drains/Airways     Name Placement date Placement time Site Days   Peripheral IV 01/18/24 20 G Right Antecubital 01/18/24  1523  Antecubital  1   Wound 01/09/24 1412 Surgical Closed Surgical Incision Hip Left 01/09/24  1412  Hip  10            ASSESSMENT/PLAN:  Assessment: Principal Problem:   Dislocated hip, left, initial encounter [facility]) Active Problems:   Hypokalemia   Plan: Dislocated L hip prosthesis S/p closed fracture of L fumer S/p L total hip arthroplasty Failed closed reduction in the ED and in the OR 6/27 + 6/28. Awaiting open reduction in the OR on 6/30 - NPO at MN - Subc heparin  - Scheduled acetaminophen  1000 mg TID  - PRN oxycodone  5 mg q4H daily - PRN IV dialudid 0.5 mg q2HR  Leukocytosis Likely reactive. No infectious signs or symptoms - Monitoring  HTN Normotensive most recently. Will continue to hold until the procedure. Tends to get hypertensive when pain medications wear off. - Restart meds as appropriate: - Metoprolol  tartrate 100 mg BID - Amlodipine  10 mg daily   Hx TIA HLD - Holding ASA 81 mg  - Continue on Lipitor 20 mg daily   Sinus tachycardia Pain related, improving. Off of betablocker. Suspect some rebound tachycardia as well - Treat pain as above    Anemia of blood loss S/p 1unit  pRBC during last admission. Stable Hgb 8.3 - - CTM  Best Practice: Diet: Regular diet IVF: None VTE: SCDs Start: 01/19/24 0933 heparin injection 5,000 Units Start: 01/18/24 1645 SCDs Start: 01/18/24 1633 Code: Full Pain Medicine: Tylenol , oxycodone , dilaudid  Bowel Regimen: Senna, miralax  Therapy Recs: Pending DISPO: Anticipated discharge TBD after surgery and recovery plan.  Signature:  Hadassah Kristy Ahr, MD [facility] Internal Medicine [facility] - PGY-2 01/20/2024, 1:18 PM

## 2024-01-20 NOTE — Anesthesia Preprocedure Evaluation (Signed)
Anesthesia Evaluation    Airway        Dental   Pulmonary former smoker          Cardiovascular hypertension,      Neuro/Psych    GI/Hepatic   Endo/Other    Renal/GU      Musculoskeletal   Abdominal   Peds  Hematology   Anesthesia Other Findings   Reproductive/Obstetrics                              Anesthesia Physical Anesthesia Plan Anesthesia Quick Evaluation  

## 2024-01-21 ENCOUNTER — Encounter (HOSPITAL_COMMUNITY): Payer: Self-pay | Admitting: Anesthesiology

## 2024-01-21 ENCOUNTER — Other Ambulatory Visit: Payer: Self-pay

## 2024-01-21 ENCOUNTER — Encounter (HOSPITAL_COMMUNITY): Payer: Self-pay | Admitting: Infectious Diseases

## 2024-01-21 ENCOUNTER — Inpatient Hospital Stay (HOSPITAL_COMMUNITY)

## 2024-01-21 ENCOUNTER — Inpatient Hospital Stay (HOSPITAL_COMMUNITY): Payer: Self-pay | Admitting: Anesthesiology

## 2024-01-21 ENCOUNTER — Encounter (HOSPITAL_COMMUNITY)
Admission: EM | Disposition: A | Discharge status: Home / Self Care | Payer: Self-pay | Source: Ambulatory Visit | Attending: Internal Medicine

## 2024-01-21 DIAGNOSIS — S73005A Unspecified dislocation of left hip, initial encounter: Secondary | ICD-10-CM | POA: Diagnosis not present

## 2024-01-21 DIAGNOSIS — E782 Mixed hyperlipidemia: Secondary | ICD-10-CM

## 2024-01-21 DIAGNOSIS — T84021A Dislocation of internal left hip prosthesis, initial encounter: Principal | ICD-10-CM

## 2024-01-21 DIAGNOSIS — I10 Essential (primary) hypertension: Secondary | ICD-10-CM | POA: Diagnosis not present

## 2024-01-21 DIAGNOSIS — Z87891 Personal history of nicotine dependence: Secondary | ICD-10-CM

## 2024-01-21 HISTORY — PX: TOTAL HIP REVISION: SHX763

## 2024-01-21 LAB — PREPARE RBC (CROSSMATCH)

## 2024-01-21 LAB — RENAL FUNCTION PANEL
Albumin: 2.2 g/dL — ABNORMAL LOW (ref 3.5–5.0)
Anion gap: 7 (ref 5–15)
BUN: <5 mg/dL — ABNORMAL LOW (ref 8–23)
CO2: 30 mmol/L (ref 22–32)
Calcium: 7.5 mg/dL — ABNORMAL LOW (ref 8.9–10.3)
Chloride: 93 mmol/L — ABNORMAL LOW (ref 98–111)
Creatinine, Ser: 0.65 mg/dL (ref 0.61–1.24)
GFR, Estimated: >60 mL/min (ref >60)
Glucose, Bld: 111 mg/dL — ABNORMAL HIGH (ref 70–99)
Phosphorus: 3.2 mg/dL (ref 2.5–4.6)
Potassium: 3.5 mmol/L (ref 3.5–5.1)
Sodium: 130 mmol/L — ABNORMAL LOW (ref 135–145)

## 2024-01-21 LAB — CBC
HCT: 24.3 % — ABNORMAL LOW (ref 39.0–52.0)
Hemoglobin: 7.8 g/dL — ABNORMAL LOW (ref 13.0–17.0)
MCH: 29.5 pg (ref 26.0–34.0)
MCHC: 32.1 g/dL (ref 30.0–36.0)
MCV: 92 fL (ref 80.0–100.0)
Platelets: 327 10*3/uL (ref 150–400)
RBC: 2.64 MIL/uL — ABNORMAL LOW (ref 4.22–5.81)
RDW: 14.6 % (ref 11.5–15.5)
WBC: 11.3 10*3/uL — ABNORMAL HIGH (ref 4.0–10.5)
nRBC: 0 % (ref 0.0–0.2)

## 2024-01-21 SURGERY — TOTAL HIP REVISION
Anesthesia: General | Site: Hip | Laterality: Left

## 2024-01-21 MED ORDER — NEOSTIGMINE METHYLSULFATE 3 MG/3ML IV SOSY
PREFILLED_SYRINGE | INTRAVENOUS | Status: AC
  Filled 2024-01-21: qty 3

## 2024-01-21 MED ORDER — ACETAMINOPHEN 500 MG PO TABS
1000.0000 mg | ORAL_TABLET | Freq: Once | ORAL | Status: AC
  Administered 2024-01-21: 1000 mg via ORAL

## 2024-01-21 MED ORDER — SODIUM CHLORIDE 0.9 % IV SOLN: 10.0000 mL/h | Freq: Once | INTRAVENOUS | Status: DC

## 2024-01-21 MED ORDER — TRANEXAMIC ACID-NACL 1000-0.7 MG/100ML-% IV SOLN
1000.0000 mg | INTRAVENOUS | Status: AC
  Administered 2024-01-21: 1000 mg via INTRAVENOUS
  Filled 2024-01-21: qty 100

## 2024-01-21 MED ORDER — POVIDONE-IODINE 10 % EX SWAB: 2.0000 | Freq: Once | CUTANEOUS | Status: DC

## 2024-01-21 MED ORDER — PROPOFOL 10 MG/ML IV BOLUS
INTRAVENOUS | Status: AC
  Filled 2024-01-21: qty 20

## 2024-01-21 MED ORDER — SUGAMMADEX SODIUM 200 MG/2ML IV SOLN
INTRAVENOUS | Status: DC | PRN
  Administered 2024-01-21: 50 mg via INTRAVENOUS

## 2024-01-21 MED ORDER — CHLORHEXIDINE GLUCONATE 0.12 % MT SOLN: 15.0000 mL | Freq: Once | OROMUCOSAL | Status: AC

## 2024-01-21 MED ORDER — OXYCODONE HCL 5 MG PO TABS: 5.0000 mg | ORAL_TABLET | Freq: Once | ORAL | Status: DC | PRN

## 2024-01-21 MED ORDER — MEPERIDINE HCL 25 MG/ML IJ SOLN: 6.2500 mg | INTRAMUSCULAR | Status: DC | PRN

## 2024-01-21 MED ORDER — CELECOXIB 200 MG PO CAPS: 200.0000 mg | ORAL_CAPSULE | Freq: Once | ORAL | Status: AC

## 2024-01-21 MED ORDER — GLYCOPYRROLATE PF 0.2 MG/ML IJ SOSY
PREFILLED_SYRINGE | INTRAMUSCULAR | Status: AC
  Filled 2024-01-21: qty 3

## 2024-01-21 MED ORDER — MIDAZOLAM HCL 2 MG/2ML IJ SOLN
INTRAMUSCULAR | Status: AC
  Filled 2024-01-21: qty 2

## 2024-01-21 MED ORDER — POTASSIUM CHLORIDE CRYS ER 20 MEQ PO TBCR
40.0000 meq | EXTENDED_RELEASE_TABLET | Freq: Once | ORAL | Status: AC
  Administered 2024-01-21: 40 meq via ORAL
  Filled 2024-01-21: qty 2

## 2024-01-21 MED ORDER — ONDANSETRON HCL 4 MG/2ML IJ SOLN
INTRAMUSCULAR | Status: AC
  Filled 2024-01-21: qty 2

## 2024-01-21 MED ORDER — BUPIVACAINE-EPINEPHRINE 0.25% -1:200000 IJ SOLN
INTRAMUSCULAR | Status: DC | PRN
  Administered 2024-01-21: 20 mL

## 2024-01-21 MED ORDER — FENTANYL CITRATE (PF) 100 MCG/2ML IJ SOLN
INTRAMUSCULAR | Status: AC
  Filled 2024-01-21: qty 2

## 2024-01-21 MED ORDER — DEXAMETHASONE SODIUM PHOSPHATE 10 MG/ML IJ SOLN
INTRAMUSCULAR | Status: DC | PRN
  Administered 2024-01-21: 5 mg via INTRAVENOUS

## 2024-01-21 MED ORDER — FENTANYL CITRATE (PF) 250 MCG/5ML IJ SOLN
INTRAMUSCULAR | Status: AC
  Filled 2024-01-21: qty 5

## 2024-01-21 MED ORDER — CELECOXIB 200 MG PO CAPS
ORAL_CAPSULE | ORAL | Status: AC
  Administered 2024-01-21: 200 mg via ORAL
  Filled 2024-01-21: qty 1

## 2024-01-21 MED ORDER — 0.9 % SODIUM CHLORIDE (POUR BTL) OPTIME
TOPICAL | Status: DC | PRN
  Administered 2024-01-21: 1000 mL

## 2024-01-21 MED ORDER — LACTATED RINGERS IV SOLN: INTRAVENOUS | Status: DC

## 2024-01-21 MED ORDER — NEOSTIGMINE METHYLSULFATE 3 MG/3ML IV SOSY
PREFILLED_SYRINGE | INTRAVENOUS | Status: DC | PRN
Start: 2024-01-21 — End: 2024-01-21
  Administered 2024-01-21: 5 mg via INTRAVENOUS

## 2024-01-21 MED ORDER — BUPIVACAINE LIPOSOME 1.3 % IJ SUSP
INTRAMUSCULAR | Status: AC
  Filled 2024-01-21: qty 20

## 2024-01-21 MED ORDER — GLYCOPYRROLATE PF 0.2 MG/ML IJ SOSY
PREFILLED_SYRINGE | INTRAMUSCULAR | Status: DC | PRN
  Administered 2024-01-21: 1 mg via INTRAVENOUS

## 2024-01-21 MED ORDER — GLYCOPYRROLATE PF 0.2 MG/ML IJ SOSY
PREFILLED_SYRINGE | INTRAMUSCULAR | Status: AC
  Filled 2024-01-21: qty 1

## 2024-01-21 MED ORDER — LIDOCAINE 2% (20 MG/ML) 5 ML SYRINGE
INTRAMUSCULAR | Status: DC | PRN
  Administered 2024-01-21: 60 mg via INTRAVENOUS

## 2024-01-21 MED ORDER — FENTANYL CITRATE (PF) 100 MCG/2ML IJ SOLN
25.0000 ug | INTRAMUSCULAR | Status: DC | PRN
  Administered 2024-01-21: 50 ug via INTRAVENOUS

## 2024-01-21 MED ORDER — BUPIVACAINE-EPINEPHRINE (PF) 0.25% -1:200000 IJ SOLN
INTRAMUSCULAR | Status: AC
  Filled 2024-01-21: qty 30

## 2024-01-21 MED ORDER — PROPOFOL 10 MG/ML IV BOLUS
INTRAVENOUS | Status: DC | PRN
  Administered 2024-01-21: 100 mg via INTRAVENOUS
  Administered 2024-01-21: 50 mg via INTRAVENOUS

## 2024-01-21 MED ORDER — DEXAMETHASONE SODIUM PHOSPHATE 10 MG/ML IJ SOLN
INTRAMUSCULAR | Status: AC
  Filled 2024-01-21: qty 1

## 2024-01-21 MED ORDER — CEFAZOLIN SODIUM-DEXTROSE 2-4 GM/100ML-% IV SOLN
2.0000 g | INTRAVENOUS | Status: AC
  Administered 2024-01-21: 2 g via INTRAVENOUS
  Filled 2024-01-21: qty 100

## 2024-01-21 MED ORDER — BUPIVACAINE LIPOSOME 1.3 % IJ SUSP
INTRAMUSCULAR | Status: DC | PRN
  Administered 2024-01-21: 20 mL

## 2024-01-21 MED ORDER — ONDANSETRON HCL 4 MG/2ML IJ SOLN: 4.0000 mg | Freq: Once | INTRAMUSCULAR | Status: DC | PRN

## 2024-01-21 MED ORDER — ONDANSETRON HCL 4 MG/2ML IJ SOLN
INTRAMUSCULAR | Status: DC | PRN
  Administered 2024-01-21: 4 mg via INTRAVENOUS

## 2024-01-21 MED ORDER — SODIUM CHLORIDE 0.9 % IV SOLN: INTRAVENOUS | Status: DC | PRN

## 2024-01-21 MED ORDER — ORAL CARE MOUTH RINSE: 15.0000 mL | Freq: Once | OROMUCOSAL | Status: AC

## 2024-01-21 MED ORDER — CHLORHEXIDINE GLUCONATE 4 % EX SOLN
60.0000 mL | Freq: Once | CUTANEOUS | Status: AC
  Administered 2024-01-21: 4 via TOPICAL
  Filled 2024-01-21: qty 15

## 2024-01-21 MED ORDER — MIDAZOLAM HCL 2 MG/2ML IJ SOLN
INTRAMUSCULAR | Status: DC | PRN
  Administered 2024-01-21: 2 mg via INTRAVENOUS

## 2024-01-21 MED ORDER — CHLORHEXIDINE GLUCONATE 0.12 % MT SOLN
OROMUCOSAL | Status: AC
  Administered 2024-01-21: 15 mL via OROMUCOSAL
  Filled 2024-01-21: qty 15

## 2024-01-21 MED ORDER — SODIUM CHLORIDE 0.9 % IR SOLN
Status: DC | PRN
  Administered 2024-01-21: 3000 mL

## 2024-01-21 MED ORDER — FENTANYL CITRATE (PF) 250 MCG/5ML IJ SOLN
INTRAMUSCULAR | Status: DC | PRN
Start: 2024-01-21 — End: 2024-01-21
  Administered 2024-01-21 (×3): 50 ug via INTRAVENOUS

## 2024-01-21 MED ORDER — ROCURONIUM BROMIDE 10 MG/ML (PF) SYRINGE
PREFILLED_SYRINGE | INTRAVENOUS | Status: DC | PRN
  Administered 2024-01-21: 60 mg via INTRAVENOUS

## 2024-01-21 MED ORDER — OXYCODONE HCL 5 MG/5ML PO SOLN: 5.0000 mg | Freq: Once | ORAL | Status: DC | PRN

## 2024-01-21 SURGICAL SUPPLY — 46 items
BLADE SAGITTAL 25.0X1.27X90 (BLADE) ×1 IMPLANT
CANISTER WOUNDNEG PRESSURE 500 (CANNISTER) IMPLANT
CHLORAPREP W/TINT 26 (MISCELLANEOUS) ×2 IMPLANT
COVER SURGICAL LIGHT HANDLE (MISCELLANEOUS) ×1 IMPLANT
DERMABOND ADVANCED .7 DNX12 (GAUZE/BANDAGES/DRESSINGS) ×1 IMPLANT
DRAPE HALF SHEET 40X57 (DRAPES) ×1 IMPLANT
DRAPE HIP W/POCKET STRL (MISCELLANEOUS) ×1 IMPLANT
DRAPE INCISE IOBAN 85X60 (DRAPES) ×1 IMPLANT
DRAPE POUCH INSTRU U-SHP 10X18 (DRAPES) ×1 IMPLANT
DRAPE U-SHAPE 47X51 STRL (DRAPES) ×2 IMPLANT
DRESSING PREVENA PLUS CUSTOM (GAUZE/BANDAGES/DRESSINGS) IMPLANT
DRSG AQUACEL AG ADV 3.5X10 (GAUZE/BANDAGES/DRESSINGS) ×1 IMPLANT
ELECT REM PT RETURN 15FT ADLT (MISCELLANEOUS) ×1 IMPLANT
ELECTRODE BLDE 4.0 EZ CLN MEGD (MISCELLANEOUS) ×1 IMPLANT
GLOVE BIOGEL PI IND STRL 8 (GLOVE) ×1 IMPLANT
GLOVE SRG 8 PF TXTR STRL LF DI (GLOVE) ×1 IMPLANT
GLOVE SURG ORTHO LTX SZ8 (GLOVE) ×2 IMPLANT
GOWN STRL REUS W/ TWL XL LVL3 (GOWN DISPOSABLE) ×1 IMPLANT
GOWN STRL REUS W/TWL MED LVL3 (GOWN DISPOSABLE) ×2 IMPLANT
HOOD PEEL AWAY T7 (MISCELLANEOUS) ×3 IMPLANT
KIT BASIN OR (CUSTOM PROCEDURE TRAY) ×1 IMPLANT
KIT TURNOVER KIT B (KITS) ×1 IMPLANT
MANIFOLD NEPTUNE II (INSTRUMENTS) ×1 IMPLANT
MARKER SKIN DUAL TIP RULER LAB (MISCELLANEOUS) ×1 IMPLANT
NDL 18GX1X1/2 (RX/OR ONLY) (NEEDLE) ×1 IMPLANT
NEEDLE 18GX1X1/2 (RX/OR ONLY) (NEEDLE) ×1 IMPLANT
NS IRRIG 1000ML POUR BTL (IV SOLUTION) ×1 IMPLANT
PACK TOTAL JOINT (CUSTOM PROCEDURE TRAY) ×1 IMPLANT
RETRIEVER SUT HEWSON (MISCELLANEOUS) ×1 IMPLANT
SET HNDPC FAN SPRY TIP SCT (DISPOSABLE) ×1 IMPLANT
SET INTERPULSE LAVAGE W/TIP (ORTHOPEDIC DISPOSABLE SUPPLIES) ×1 IMPLANT
SUCTION TUBE FRAZIER 10FR DISP (SUCTIONS) ×1 IMPLANT
SUT ETHIBOND 2 V 37 (SUTURE) ×1 IMPLANT
SUT MNCRL AB 3-0 PS2 18 (SUTURE) ×1 IMPLANT
SUT STRATAFIX 1PDS 45CM VIOLET (SUTURE) ×2 IMPLANT
SUT STRATAFIX SPIRAL PDS+ 0 30 (SUTURE) IMPLANT
SUT VIC AB 0 CT1 27XBRD ANBCTR (SUTURE) ×1 IMPLANT
SUT VIC AB 2-0 CT1 TAPERPNT 27 (SUTURE) IMPLANT
SUT VIC AB 2-0 CT2 27 (SUTURE) ×2 IMPLANT
SUTURE TAPE TIGERLINK 1.3MM BL (SUTURE) IMPLANT
SYR 20ML LL LF (SYRINGE) ×1 IMPLANT
SYR 50ML LL SCALE MARK (SYRINGE) ×1 IMPLANT
TOWEL GREEN STERILE (TOWEL DISPOSABLE) ×1 IMPLANT
TRAY FOLEY MTR SLVR 16FR STAT (SET/KITS/TRAYS/PACK) ×1 IMPLANT
TUBE SUCT ARGYLE STRL (TUBING) ×1 IMPLANT
WATER STERILE IRR 1000ML POUR (IV SOLUTION) ×1 IMPLANT

## 2024-01-21 NOTE — Op Note (Signed)
 01/18/2024 - 01/21/2024  5:39 PM  PATIENT:  Jonathon Anderson   MRN: [id number]  PRE-OPERATIVE DIAGNOSIS: Left closed hip dislocation after conversion to proximal femoral replacement  POST-OPERATIVE DIAGNOSIS:  same  PROCEDURE: Left Open Hip Reduction with Local Tissue Advancement  PREOPERATIVE INDICATIONS:    Jonathon Anderson is an 68 y.o. male who had sustained a hip dislocation on Friday of last week after undergoing conversion to total hip arthroplasty for intertrochanteric fracture nonunion approximately 2 weeks prior.  Attempted reductions were done by the emergency room as well as by Dr. Cristy in the OR on Saturday.  Given unsuccessful close reduction he was taken to the OR today for open reduction.  Discussed possibility of component revision we will also plan for revising the fixation of the greater trochanter osteotomy to help of a provided better stability.  The risks benefits and alternatives were discussed with the patient including but not limited to the risks of nonoperative treatment, versus surgical intervention including infection, bleeding, nerve injury, periprosthetic fracture, the need for revision surgery, dislocation, leg length discrepancy, blood clots, cardiopulmonary complications, morbidity, mortality, among others, and they were willing to proceed.     OPERATIVE REPORT     SURGEON:  Toribio Higashi, MD    ASSISTANT: Jon Hurst, RNFA, (Present throughout the entire procedure,  necessary for completion of procedure in a timely manner, assisting with retraction, instrumentation, and closure)     ANESTHESIA: General  ESTIMATED BLOOD LOSS: 150cc    COMPLICATIONS:  None.      PROCEDURE IN DETAIL:   The patient was met in the holding area and  identified.  The appropriate hip was identified and marked at the operative site.  The patient was then transported to the OR  and  placed under anesthesia.  At that point, the patient was  placed in the lateral  decubitus position with the operative side up and  secured to the operating room table  and all bony prominences padded. A subaxillary role was also placed.    The operative lower extremity was prepped from the iliac crest to the distal leg.  Sterile draping was performed.  Preoperative antibiotics, 2 gm of ancef ,1 gm of Tranexamic Acid  Time out was performed prior to incision.      A routine posterolateral approach was utilized via sharp dissection utilizing the patient's old scar carried down to the subcutaneous tissue.  Gross bleeders were Bovie coagulated.  The iliotibial band was identified and incised again along the prior repair.  Charnley retractor was placed with care to protect the sciatic nerve posteriorly.  Large hematoma from the hip was evacuated.  The head ball was dislocated superior and posterior.  This was carefully mobilized with removal of the hematoma and fibrotic tissue.  The head ball was inspected and found to be in good condition without damage.  The hip was then carefully reduced with flexion internal rotation and manual manipulation of the head.   Following reduction the hip was found to be very stable. There was no impingement with full extension and 90 degrees external rotation.  The hip was stable at the position of sleep and with 90 degrees flexion and 70 degrees of internal rotation.  Leg lengths were also clinically assessed in the lateral position and felt to be equal.   The Ethibond repair of the greater trochanter osteotomy was noted to be ruptured and we felt this may have contributed to his dislocation and an inability to reduce the hip closed.  We instead utilized #2 fiber tape and passed 3 tapes around the trochanter proximal middle and distal and passed through the shoulder of the implant.  We then also utilized the fourth tape and passed this around the inferior part of the trochanter fragment and then around the implant stem.  These tapes were all securely  tensioned tied and cut.   I then irrigated the hip copiously with with normal saline pulse lavage. Periarticular injection was then performed with Exparel  and quarter percent Marcaine  with epinephrine .   We repaired the fascia #1 barbed suture, followed by 0 barbed suture for the subcutaneous fat.  Skin was closed with 2-0 Vicryl and staples.  Prevena wound VAC dressing was applied. The patient was then awakened and returned to PACU in stable and satisfactory condition.  Leg lengths in the supine position were assessed and felt to be clinically equal. There were no complications.  Post op recs: WB: WBAT LLE, strict posterior hip precautions postoperatively working the patient in a hip abduction orthosis to be worn for at least 6 weeks. Abx: ancef  Imaging: PACU pelvis Xray Dressing: Aquacell, keep intact until follow up DVT prophylaxis: Aspirin  81BID starting POD1 Follow up: 2 weeks after surgery for a wound check with Dr. Edna at [facility].  Address: [address], [place], [place] [address]  Office Phone: [phone]   Toribio Edna, MD Orthopedic Surgeon

## 2024-01-21 NOTE — Anesthesia Procedure Notes (Signed)
 Procedure Name: Intubation Date/Time: 01/21/2024 4:17 PM  Performed by: Harrold Macintosh, CRNAPre-anesthesia Checklist: Patient identified, Emergency Drugs available, Suction available and Patient being monitored Patient Re-evaluated:Patient Re-evaluated prior to induction Oxygen Delivery Method: Circle system utilized Preoxygenation: Pre-oxygenation with 100% oxygen Induction Type: IV induction Ventilation: Mask ventilation without difficulty Laryngoscope Size: Miller and 2 Grade View: Grade I Tube type: Oral Tube size: 7.5 mm Number of attempts: 1 Airway Equipment and Method: Stylet Placement Confirmation: ETT inserted through vocal cords under direct vision, positive ETCO2 and breath sounds checked- equal and bilateral Secured at: 23 cm Tube secured with: Tape Dental Injury: Teeth and Oropharynx as per pre-operative assessment

## 2024-01-21 NOTE — Anesthesia Postprocedure Evaluation (Addendum)
 Anesthesia Post Note  Patient: Jonathon Anderson  Procedure(s) Performed: Open Hip Reduction with Local Tissue Advancement (Left: Hip)     Patient location during evaluation: PACU Anesthesia Type: General Level of consciousness: awake and alert Pain management: pain level controlled Vital Signs Assessment: post-procedure vital signs reviewed and stable Respiratory status: spontaneous breathing, nonlabored ventilation, respiratory function stable and patient connected to nasal cannula oxygen Cardiovascular status: stable, blood pressure returned to baseline and tachycardic Anesthetic complications: no   No notable events documented.  Last Vitals:  Vitals:   01/21/24 1900 01/21/24 1915  BP: (!) 154/77 (!) 125/94  Pulse: (!) 110 (!) 111  Resp: 16 16  Temp:    SpO2: 99% 99%    Last Pain:  Vitals:   01/21/24 1900  TempSrc:   PainSc: Asleep    LLE Motor Response: Purposeful movement;Responds to commands (01/21/24 1915)            Debby FORBES Like

## 2024-01-21 NOTE — Progress Notes (Signed)
 HD#2 SUBJECTIVE:  Patient Summary: Jonathon Anderson is a 68 y.o. with a pertinent PMH of HTN, HLD, TIA, s/p L hip surgery on 6/16 for closed fracture of L femur with nonunion with a conversion from L hip arthoplasty to proximal femur replacement who presented to the ED for worsening L leg pain at the site of his surgery and admitted for L hip prosthetic dislocation with 2x attempted close reductions.  Overnight Events and Interim History: Patient underwent open reduction today. When patient was seen in the morning, he was comfortable in bed. Denied chest pain, SOB, N/V, pain was well controlled, no current complaints.   OBJECTIVE:  Vital Signs: Vitals:   01/20/24 2014 01/21/24 0518 01/21/24 0835 01/21/24 1346  BP: (!) 157/88 (!) 154/76 118/70 (!) 143/83  Pulse: (!) 106 (!) 108 98 (!) 108  Resp: 18 18 16 18   Temp: 98.4 F (36.9 C) 98.2 F (36.8 C) 98 F (36.7 C) 98.5 F (36.9 C)  TempSrc: Oral Oral  Oral  SpO2: 93% 100% 96% 95%  Weight:    101 kg  Height:    5' 7 (1.702 m)   Supplemental O2: Room Air SpO2: 95 % O2 Flow Rate (L/min): 22 L/min  Filed Weights   01/18/24 1335 01/19/24 0733 01/21/24 1346  Weight: 101 kg 101 kg 101 kg     Intake/Output Summary (Last 24 hours) at 01/21/2024 1604 Last data filed at 01/21/2024 1117 Gross per 24 hour  Intake 240 ml  Output 300 ml  Net -60 ml   Net IO Since Admission: -235 mL [01/21/24 1604]  Physical Exam: Physical Exam Constitutional:      Appearance: Normal appearance.   Cardiovascular:     Rate and Rhythm: Regular rhythm. Tachycardia present.     Heart sounds: Normal heart sounds.  Pulmonary:     Breath sounds: Rhonchi (cleared with coughing) present.  Abdominal:     Palpations: Abdomen is soft.     Tenderness: There is no abdominal tenderness. There is no guarding.   Skin:    General: Skin is warm and dry.   Neurological:     General: No focal deficit present.     Mental Status: He is alert and oriented to  person, place, and time.   Psychiatric:        Mood and Affect: Mood normal.        Behavior: Behavior normal.     Patient Lines/Drains/Airways Status     Active Line/Drains/Airways     Name Placement date Placement time Site Days   Arterial Line 01/21/24 Left Radial 01/21/24  1430  Radial  less than 1   Peripheral IV 01/18/24 20 G Right Antecubital 01/18/24  1523  Antecubital  3   Peripheral IV 01/21/24 18 G 1.16 Left Antecubital 01/21/24  1427  Antecubital  less than 1   Wound 01/09/24 1412 Surgical Closed Surgical Incision Hip Left 01/09/24  1412  Hip  12            Pertinent labs and imaging:     Latest Ref Rng & Units 01/21/2024    7:35 AM 01/20/2024    9:12 AM 01/19/2024   10:46 AM  CBC  WBC 4.0 - 10.5 K/uL 11.3  13.0  11.1   Hemoglobin 13.0 - 17.0 g/dL 7.8  8.3  8.6   Hematocrit 39.0 - 52.0 % 24.3  26.3  26.6   Platelets 150 - 400 K/uL 327  370  355  Latest Ref Rng & Units 01/21/2024    5:00 AM 01/20/2024    9:12 AM 01/19/2024   10:46 AM  CMP  Glucose 70 - 99 mg/dL 888  873  878   BUN 8 - 23 mg/dL <5  <5  <5   Creatinine 0.61 - 1.24 mg/dL 9.34  9.25  9.33   Sodium 135 - 145 mmol/L 130  134  133   Potassium 3.5 - 5.1 mmol/L 3.5  3.1  3.2   Chloride 98 - 111 mmol/L 93  95  96   CO2 22 - 32 mmol/L 30  28  30    Calcium  8.9 - 10.3 mg/dL 7.5  8.1  8.2   Total Protein 6.5 - 8.1 g/dL   6.2   Total Bilirubin 0.0 - 1.2 mg/dL   0.5   Alkaline Phos 38 - 126 U/L   94   AST 15 - 41 U/L   21   ALT 0 - 44 U/L   12     No results found.  ASSESSMENT/PLAN:  Assessment: Principal Problem:   Dislocated hip, left, initial encounter [facility]) Active Problems:   Hypokalemia   Plan: #Dislocated hip, Left  -Failed closed reduction in the ED and in the OR on 6/27 and 6/28 -Open Reduction on 6/30 - Acetaminophen  1000 mg TID, PRN oxycodone  5 mg q4H daily, PRN IV dilaudid  0.5 mg q2HR for pain control. Will change after procedure if needed  #Anemia - will do anemia  workup labs and check in AM: CBC, Iron studies, and reticulocyte count - Dr. Edna (Orthopedics) ordered 1 unit pRBC prior to surgery with Hgb 7.8 - previous admission required 1 unit pRBC   #Hypokalemia  -received 40 mEq Kcl oral  -follow K  #Leukocytosis  -Likely reactive. No infectious signs/symptoms (afebrile) -continue monitoring   #HTN - likely reactive to pain - restart meds as appropriate: metoprolol  100 mg BID, Amlodipine  10 mg daily   #Sinus Tachycardia  - likely pain related, improving   Best Practice: Diet: Regular diet post surgery  IVF: None VTE: SCDs Start: 01/19/24 0933 SCDs Start: 01/18/24 1633 Pain Medication: Tylenol , oxycodone , dilaudid ; subject to change post OR Bowel Regimen: Senna, miralax   Therapy Recs: Pending  DISPO: Tbd until after surgery  Code: Full   Signature:  Sallyanne Primas, DO [facility] (PGY-1) 4:04 PM, 01/21/2024  On Call pager [phone]

## 2024-01-21 NOTE — Progress Notes (Signed)
     Subjective:  Patient reports pain as moderate.  Comfortable at rest.  Denies distal numbness and tingling.  Understands plan for surgery today for open reduction of his left hip with possible component revision.  Objective:   VITALS:   Vitals:   01/20/24 1453 01/20/24 2014 01/21/24 0518 01/21/24 0835  BP: (!) 164/95 (!) 157/88 (!) 154/76 118/70  Pulse:  (!) 106 (!) 108 98  Resp: 18 18 18 16   Temp: 98.3 F (36.8 C) 98.4 F (36.9 C) 98.2 F (36.8 C) 98 F (36.7 C)  TempSrc:  Oral Oral   SpO2: 98% 93% 100% 96%  Weight:      Height:        Intact pulses distally Dorsiflexion/Plantar flexion intact Staples to left lower extremity are intact Moderate swelling about the left thigh Shortening of the left lower extremity  Lab Results  Component Value Date   WBC 11.3 (H) 01/21/2024   HGB 7.8 (L) 01/21/2024   HCT 24.3 (L) 01/21/2024   MCV 92.0 01/21/2024   PLT 327 01/21/2024   BMET    Component Value Date/Time   NA 130 (L) 01/21/2024 0500   K 3.5 01/21/2024 0500   CL 93 (L) 01/21/2024 0500   CO2 30 01/21/2024 0500   GLUCOSE 111 (H) 01/21/2024 0500   BUN <5 (L) 01/21/2024 0500   CREATININE 0.65 01/21/2024 0500   CALCIUM  7.5 (L) 01/21/2024 0500   GFRNONAA >60 01/21/2024 0500      Xray: X-rays and CT scan reviewed demonstrates posterior superior dislocation of the proximal femoral placement without acute fracture or other adverse features  Assessment/Plan: * Day of Surgery *   Principal Problem:   Dislocated hip, left, initial encounter [facility]) Active Problems:   Hypokalemia  Left hip dislocation after proximal femoral placement with 2 unsuccessful closed reduction attempts  Plan for open reduction with possible revision and repair today.  Starting hemoglobin 7.8.  Patient will benefit from blood transfusion perioperatively will order 1 unit now. Patient has been n.p.o. since midnight.  Okay for sips with meds and ice chips. Heparin held this morning.  The  risks benefits and alternatives were discussed with the patient including but not limited to the risks of nonoperative treatment, versus surgical intervention including infection, bleeding, nerve injury, malunion, nonunion, the need for revision surgery, hardware prominence, hardware failure, the need for hardware removal, blood clots, cardiopulmonary complications, morbidity, mortality, among others, and they were willing to proceed.      Skylie Hiott A Zowie Lundahl 01/21/2024, 9:03 AM   Toribio Higashi, MD  Contact information:   [phone] 7am-5pm epic message Dr. Higashi, or call office for patient follow up: [phone] After hours and holidays please check [email or web address] for group call information for [facility]

## 2024-01-21 NOTE — Progress Notes (Signed)
 Asked by day team to check on this patient in the PACU s/p open reduction of his L hip arthoplasty. He was awake and oriented to self and date only. He denies any pain, feels hungry and wants to eat. He does appear to be slowly improving per PACU nursing but is still drowsy from anesthesia. However, handling secretions well. Will add a diet. Please page for any acute changes to the number below.   Jonathon Espejo, DO Internal Medicine Resident, PGY-1 Please contact the on call pager at [phone] for any urgent or emergent needs. 8:14 PM 01/21/2024

## 2024-01-21 NOTE — Plan of Care (Signed)
  Problem: Clinical Measurements: Goal: Respiratory complications will improve Outcome: Progressing Goal: Cardiovascular complication will be avoided Outcome: Progressing   Problem: Activity: Goal: Risk for activity intolerance will decrease Outcome: Progressing   Problem: Nutrition: Goal: Adequate nutrition will be maintained Outcome: Progressing   Problem: Elimination: Goal: Will not experience complications related to bowel motility Outcome: Progressing Goal: Will not experience complications related to urinary retention Outcome: Progressing   

## 2024-01-21 NOTE — Anesthesia Procedure Notes (Signed)
 Arterial Line Insertion Start/End6/30/2025 2:30 PM, 01/21/2024 2:45 PM Performed by: Mallory Manus, MD, Cindie Donald CROME, CRNA, CRNA  Patient location: Pre-op. Preanesthetic checklist: patient identified, IV checked, site marked, risks and benefits discussed, surgical consent, monitors and equipment checked, pre-op evaluation, timeout performed and anesthesia consent Lidocaine  1% used for infiltration Left, radial was placed Catheter size: 20 G Hand hygiene performed  and maximum sterile barriers used   Attempts: 1 Procedure performed without using ultrasound guided technique. Following insertion, dressing applied and Biopatch. Post procedure assessment: normal and unchanged  Patient tolerated the procedure well with no immediate complications.

## 2024-01-21 NOTE — Transfer of Care (Signed)
 Immediate Anesthesia Transfer of Care Note  Patient: Jonathon Anderson  Procedure(s) Performed: Open Hip Reduction with Local Tissue Advancement (Left: Hip)  Patient Location: PACU  Anesthesia Type:General  Level of Consciousness: awake, alert , patient cooperative, and confused  Airway & Oxygen Therapy: Patient Spontanous Breathing and Patient connected to nasal cannula oxygen  Post-op Assessment: Report given to RN, Post -op Vital signs reviewed and stable, Patient moving all extremities X 4, and Patient able to stick tongue midline  Post vital signs: Reviewed and stable  Last Vitals:  Vitals Value Taken Time  BP 149/79 01/21/24 18:22  Temp 97.8   Pulse 114 01/21/24 18:22  Resp 20 01/21/24 18:22  SpO2 93 % 01/21/24 18:22  Vitals shown include unfiled device data.  Last Pain:  Vitals:   01/21/24 1407  TempSrc:   PainSc: 7          Complications: No notable events documented.

## 2024-01-22 ENCOUNTER — Encounter (HOSPITAL_COMMUNITY): Payer: Self-pay | Admitting: Orthopedic Surgery

## 2024-01-22 DIAGNOSIS — T84021A Dislocation of internal left hip prosthesis, initial encounter: Secondary | ICD-10-CM

## 2024-01-22 DIAGNOSIS — I10 Essential (primary) hypertension: Secondary | ICD-10-CM

## 2024-01-22 LAB — IRON AND TIBC
Iron: 20 ug/dL — ABNORMAL LOW (ref 45–182)
Saturation Ratios: 11 % — ABNORMAL LOW (ref 17.9–39.5)
TIBC: 188 ug/dL — ABNORMAL LOW (ref 250–450)
UIBC: 168 ug/dL

## 2024-01-22 LAB — CBC WITH DIFFERENTIAL/PLATELET
Abs Immature Granulocytes: 0.2 10*3/uL — ABNORMAL HIGH (ref 0.00–0.07)
Basophils Absolute: 0 10*3/uL (ref 0.0–0.1)
Basophils Relative: 0 %
Eosinophils Absolute: 0 10*3/uL (ref 0.0–0.5)
Eosinophils Relative: 0 %
HCT: 24.7 % — ABNORMAL LOW (ref 39.0–52.0)
Hemoglobin: 7.8 g/dL — ABNORMAL LOW (ref 13.0–17.0)
Immature Granulocytes: 1 %
Lymphocytes Relative: 4 %
Lymphs Abs: 0.8 10*3/uL (ref 0.7–4.0)
MCH: 29.5 pg (ref 26.0–34.0)
MCHC: 31.6 g/dL (ref 30.0–36.0)
MCV: 93.6 fL (ref 80.0–100.0)
Monocytes Absolute: 0.7 10*3/uL (ref 0.1–1.0)
Monocytes Relative: 3 %
Neutro Abs: 19 10*3/uL — ABNORMAL HIGH (ref 1.7–7.7)
Neutrophils Relative %: 92 %
Platelets: 445 10*3/uL — ABNORMAL HIGH (ref 150–400)
RBC: 2.64 MIL/uL — ABNORMAL LOW (ref 4.22–5.81)
RDW: 14.5 % (ref 11.5–15.5)
WBC: 20.7 10*3/uL — ABNORMAL HIGH (ref 4.0–10.5)
nRBC: 0 % (ref 0.0–0.2)

## 2024-01-22 LAB — RETICULOCYTES
Immature Retic Fract: 29.6 % — ABNORMAL HIGH (ref 2.3–15.9)
RBC.: 2.71 MIL/uL — ABNORMAL LOW (ref 4.22–5.81)
Retic Count, Absolute: 141.7 10*3/uL (ref 19.0–186.0)
Retic Ct Pct: 5.2 % — ABNORMAL HIGH (ref 0.4–3.1)

## 2024-01-22 LAB — BASIC METABOLIC PANEL WITH GFR
Anion gap: 10 (ref 5–15)
BUN: 5 mg/dL — ABNORMAL LOW (ref 8–23)
CO2: 28 mmol/L (ref 22–32)
Calcium: 8 mg/dL — ABNORMAL LOW (ref 8.9–10.3)
Chloride: 93 mmol/L — ABNORMAL LOW (ref 98–111)
Creatinine, Ser: 0.64 mg/dL (ref 0.61–1.24)
GFR, Estimated: >60 mL/min (ref >60)
Glucose, Bld: 137 mg/dL — ABNORMAL HIGH (ref 70–99)
Potassium: 4.4 mmol/L (ref 3.5–5.1)
Sodium: 131 mmol/L — ABNORMAL LOW (ref 135–145)

## 2024-01-22 LAB — TECHNOLOGIST SMEAR REVIEW: Plt Morphology: NORMAL

## 2024-01-22 LAB — FERRITIN: Ferritin: 96 ng/mL (ref 24–336)

## 2024-01-22 LAB — VITAMIN B12: Vitamin B-12: 385 pg/mL (ref 180–914)

## 2024-01-22 LAB — FOLATE: Folate: 5.4 ng/mL — ABNORMAL LOW (ref >5.9)

## 2024-01-22 MED ORDER — MENTHOL 3 MG MT LOZG: 1.0000 | LOZENGE | OROMUCOSAL | Status: DC | PRN

## 2024-01-22 MED ORDER — OXYCODONE HCL 5 MG PO TABS
10.0000 mg | ORAL_TABLET | ORAL | Status: DC | PRN
  Administered 2024-01-22 – 2024-01-24 (×6): 10 mg via ORAL
  Filled 2024-01-22 (×6): qty 2

## 2024-01-22 MED ORDER — AMLODIPINE BESYLATE 10 MG PO TABS
10.0000 mg | ORAL_TABLET | Freq: Every day | ORAL | Status: DC
  Administered 2024-01-22 – 2024-01-24 (×3): 10 mg via ORAL
  Filled 2024-01-22 (×3): qty 1

## 2024-01-22 MED ORDER — ACETAMINOPHEN 500 MG PO TABS
1000.0000 mg | ORAL_TABLET | Freq: Four times a day (QID) | ORAL | Status: DC
  Administered 2024-01-22 – 2024-01-24 (×8): 1000 mg via ORAL
  Filled 2024-01-22 (×10): qty 2

## 2024-01-22 MED ORDER — SODIUM CHLORIDE 0.9 % IV SOLN
1.0000 mg | Freq: Every day | INTRAVENOUS | Status: DC
  Administered 2024-01-22: 1 mg via INTRAVENOUS
  Filled 2024-01-22 (×2): qty 0.2

## 2024-01-22 MED ORDER — METOPROLOL TARTRATE 50 MG PO TABS
50.0000 mg | ORAL_TABLET | Freq: Two times a day (BID) | ORAL | Status: DC
  Administered 2024-01-22 – 2024-01-23 (×3): 50 mg via ORAL
  Filled 2024-01-22 (×3): qty 1

## 2024-01-22 MED ORDER — FOLIC ACID 5 MG/ML IJ SOLN
1.0000 mg | Freq: Every day | INTRAMUSCULAR | Status: DC
  Administered 2024-01-23: 1 mg via INTRAVENOUS
  Filled 2024-01-22: qty 0.2

## 2024-01-22 MED ORDER — IPRATROPIUM-ALBUTEROL 0.5-2.5 (3) MG/3ML IN SOLN: 3.0000 mL | RESPIRATORY_TRACT | Status: DC | PRN

## 2024-01-22 MED ORDER — GUAIFENESIN ER 600 MG PO TB12
1200.0000 mg | ORAL_TABLET | Freq: Two times a day (BID) | ORAL | Status: DC
  Administered 2024-01-22 – 2024-01-24 (×5): 1200 mg via ORAL
  Filled 2024-01-22 (×5): qty 2

## 2024-01-22 MED ORDER — ASPIRIN 81 MG PO TBEC
81.0000 mg | DELAYED_RELEASE_TABLET | Freq: Two times a day (BID) | ORAL | Status: DC
  Administered 2024-01-22 – 2024-01-24 (×5): 81 mg via ORAL
  Filled 2024-01-22 (×5): qty 1

## 2024-01-22 MED ORDER — FERROUS SULFATE 325 (65 FE) MG PO TABS
325.0000 mg | ORAL_TABLET | Freq: Every day | ORAL | Status: DC
  Administered 2024-01-22 – 2024-01-24 (×3): 325 mg via ORAL
  Filled 2024-01-22 (×3): qty 1

## 2024-01-22 MED ORDER — HYDROMORPHONE HCL 1 MG/ML IJ SOLN
1.0000 mg | INTRAMUSCULAR | Status: DC | PRN
  Administered 2024-01-22 – 2024-01-23 (×2): 1 mg via INTRAVENOUS
  Filled 2024-01-22 (×2): qty 1

## 2024-01-22 MED ORDER — CEFAZOLIN SODIUM-DEXTROSE 2-4 GM/100ML-% IV SOLN
2.0000 g | Freq: Three times a day (TID) | INTRAVENOUS | Status: DC
  Administered 2024-01-22 – 2024-01-24 (×8): 2 g via INTRAVENOUS
  Filled 2024-01-22 (×8): qty 100

## 2024-01-22 NOTE — Progress Notes (Signed)
 Orthopedic Tech Progress Note Patient Details:  LIDO MASKE 1955/08/29 [id number]  Patient ID: Arley LITTIE Bickers, male   DOB: 1955/11/19, 68 y.o.   MRN: [id number] Call to [facility] completed for LLE hip abduction brace.  Lynzie Cliburn OTR/L 01/22/2024, 7:44 AM

## 2024-01-22 NOTE — Progress Notes (Addendum)
     Subjective: Patient reports pain as mild.  Lying comfortably in bed this morning.  Discussed surgical findings from the OR yesterday.  Discussed importance of strict posterior hip precautions.  Currently he is not in hip abduction pillow.  Also ordered a hip abduction brace to come from [facility] hopefully today.  Mobilization with physical therapy also today.    Objective:   VITALS:   Vitals:   01/21/24 2007 01/22/24 0020 01/22/24 0459 01/22/24 0610  BP: 135/80 (!) 126/93 (!) 155/70 (!) 152/75  Pulse: (!) 105 (!) 109 (!) 118 (!) 117  Resp: 18 18    Temp: 99.1 F (37.3 C) 98.3 F (36.8 C) 98.5 F (36.9 C)   TempSrc: Oral Oral Oral   SpO2: 96% 100% 93% 94%  Weight:      Height:        Sensation intact distally Intact pulses distally Dorsiflexion/Plantar flexion intact Incision: dressing C/D/I Compartment soft Wound vac holding suction  Lab Results  Component Value Date   WBC 20.7 (H) 01/22/2024   HGB 7.8 (L) 01/22/2024   HCT 24.7 (L) 01/22/2024   MCV 93.6 01/22/2024   PLT 445 (H) 01/22/2024   BMET    Component Value Date/Time   NA 130 (L) 01/21/2024 0500   K 3.5 01/21/2024 0500   CL 93 (L) 01/21/2024 0500   CO2 30 01/21/2024 0500   GLUCOSE 111 (H) 01/21/2024 0500   BUN <5 (L) 01/21/2024 0500   CREATININE 0.65 01/21/2024 0500   CALCIUM  7.5 (L) 01/21/2024 0500   GFRNONAA >60 01/21/2024 0500      Xray: Total of arthroplasty components in good position and reduced hip dis.location no adverse features  Assessment/Plan: 1 Day Post-Op   Principal Problem:   Dislocated hip, left, initial encounter [facility]) Active Problems:   Hypokalemia  Status post left open hip reduction and local tissue advancement 01/21/24  Post op recs: WB: WBAT LLE, strict posterior hip precautions postoperatively working the patient in a hip abduction orthosis to be worn for at least 6 weeks. Abx: ancef  extend given higher risk of infection due to return to OR  Imaging: PACU  pelvis Xray Dressing: Prevena wound VAC dressing to remain on for 1 week  DVT prophylaxis: Aspirin  81BID starting POD1 Follow up: 1 week after surgery for a wound check with Dr. Edna at [facility].  Address: [address], [place], [place] [address]  Office Phone: [phone]    TORIBIO DELENA EDNA 01/22/2024, 7:23 AM   TORIBIO Edna, MD  Contact information:   [phone] 7am-5pm epic message Dr. Edna, or call office for patient follow up: [phone] After hours and holidays please check [email or web address] for group call information for [facility]

## 2024-01-22 NOTE — Evaluation (Addendum)
 Occupational Therapy Evaluation Patient Details Name: Jonathon Anderson MRN: [id number] DOB: 13-Dec-1955 Today's Date: 01/22/2024   History of Present Illness   68 yo male presents to [facility] ED on 6/27 from ortho office s/p fall L hip dislocation. S/p L closed reduction L hip attempt in OR 6/28, not successful; s/p L open hip reduction 6/30. On 6/18, S/p conversion to L THA with posterior hip precautions given L intertrochanteric femur fx nonunion; d/c home 6/23. PMH includes recurrent L hip surgery since 2023, HTN, HLD, TIA, CVA with residual LUE n/t.     Clinical Impressions PTA, pt reports Modified Independence with ADLs/mobility using RW since hip sx. Pt planning to discharge to sister's home who can provide 24/7 after this admission. Pt presents now with minor deficits in dynamic standing balance, L hip discomfort and endurance. Session focused on education of posterior hip precautions for ADLs, including AE use with overall Min A needed for LB ADLs assessed. Pt requires CGA for transfers using RW. Provided handout for AE and educated on where to purchase to maximize adherence to hip precautions. Recommend HHOT follow up upon DC.     If plan is discharge home, recommend the following:   Help with stairs or ramp for entrance;Assistance with cooking/housework;Assist for transportation;A little help with walking and/or transfers;A little help with bathing/dressing/bathroom     Functional Status Assessment   Patient has had a recent decline in their functional status and demonstrates the ability to make significant improvements in function in a reasonable and predictable amount of time.     Equipment Recommendations   None recommended by OT     Recommendations for Other Services         Precautions/Restrictions   Precautions Precautions: Posterior Hip;Fall Precaution Booklet Issued: Yes (comment) Recall of Precautions/Restrictions: Intact Precaution/Restrictions Comments:  wound vac Required Braces or Orthoses: Other Brace Other Brace: hip abduction brace applied by [employer], hip abduction pillow in bed Restrictions Weight Bearing Restrictions Per Provider Order: Yes LLE Weight Bearing Per Provider Order: Weight bearing as tolerated     Mobility Bed Mobility               General bed mobility comments: in recliner    Transfers Overall transfer level: Needs assistance Equipment used: Rolling walker (2 wheels) Transfers: Sit to/from Stand Sit to Stand: Contact guard assist           General transfer comment: increased time to rise with fair hand placement      Balance Overall balance assessment: Needs assistance Sitting-balance support: No upper extremity supported, Feet supported Sitting balance-Leahy Scale: Fair     Standing balance support: Bilateral upper extremity supported, During functional activity, Reliant on assistive device for balance Standing balance-Leahy Scale: Poor                             ADL either performed or assessed with clinical judgement   ADL Overall ADL's : Needs assistance/impaired Eating/Feeding: Independent;Sitting   Grooming: Set up;Sitting   Upper Body Bathing: Set up;Sitting   Lower Body Bathing: Minimal assistance;Sit to/from stand;Sitting/lateral leans   Upper Body Dressing : Set up;Sitting   Lower Body Dressing: Minimal assistance;Sit to/from stand;Sitting/lateral leans;With adaptive equipment Lower Body Dressing Details (indicate cue type and reason): Pt able to return demo use of reacher to doff socks, sock aid to don socks. able to verbalize understanding of using reacher for pants mgmt and shoehorn for heels Toilet Transfer:  Contact guard assist;Ambulation;Rolling walker (2 wheels)   Toileting- Clothing Manipulation and Hygiene: Contact guard assist;Sitting/lateral lean;Sit to/from stand               Vision Baseline Vision/History: 1 Wears glasses Ability to See in  Adequate Light: 0 Adequate Patient Visual Report: No change from baseline Vision Assessment?: No apparent visual deficits     Perception         Praxis         Pertinent Vitals/Pain Pain Assessment Pain Assessment: Faces Faces Pain Scale: Hurts a little bit Pain Location: L hip Pain Descriptors / Indicators: Grimacing, Discomfort Pain Intervention(s): Monitored during session, Limited activity within patient's tolerance     Extremity/Trunk Assessment Upper Extremity Assessment Upper Extremity Assessment: Overall WFL for tasks assessed;Right hand dominant   Lower Extremity Assessment Lower Extremity Assessment: Defer to PT evaluation LLE Deficits / Details: anticipated post-operative weakness; at least 3/5 knee extension, DF/PF when screened EOB LLE: Unable to fully assess due to immobilization;Unable to fully assess due to pain   Cervical / Trunk Assessment Cervical / Trunk Assessment: Normal   Communication Communication Communication: Impaired Factors Affecting Communication: Hearing impaired   Cognition Arousal: Alert Behavior During Therapy: WFL for tasks assessed/performed Cognition: No apparent impairments                               Following commands: Intact       Cueing  General Comments   Cueing Techniques: Verbal cues;Gestural cues  hip abd brace donned throughout session   Exercises     Shoulder Instructions      Home Living Family/patient expects to be discharged to:: Private residence Living Arrangements: Other relatives (sister) Available Help at Discharge: Family;Available 24 hours/day Type of Home: Mobile home Home Access: Stairs to enter Entrance Stairs-Number of Steps: 6 Entrance Stairs-Rails: Right;Left Home Layout: One level     Bathroom Shower/Tub: Chief Strategy Officer: Standard     Home Equipment: Control and instrumentation engineer (2 wheels);Cane - single point;Adaptive equipment Adaptive  Equipment: Reacher;Long-handled sponge Additional Comments: planning to discharge to sister's house      Prior Functioning/Environment Prior Level of Function : Independent/Modified Independent;Driving             Mobility Comments: using RW PTA since previous surgery ADLs Comments: He was independent with ADLs and driving.    OT Problem List: Decreased strength;Impaired balance (sitting and/or standing);Decreased knowledge of use of DME or AE;Pain   OT Treatment/Interventions: Self-care/ADL training;Therapeutic exercise;Therapeutic activities;Energy conservation;Patient/family education;DME and/or AE instruction;Balance training      OT Goals(Current goals can be found in the care plan section)   Acute Rehab OT Goals Patient Stated Goal: recover well OT Goal Formulation: With patient Time For Goal Achievement: 02/05/24 Potential to Achieve Goals: Good ADL Goals Pt Will Perform Lower Body Dressing: with modified independence;sitting/lateral leans;sit to/from stand;with adaptive equipment Pt Will Transfer to Toilet: with modified independence;ambulating Pt Will Perform Toileting - Clothing Manipulation and hygiene: with modified independence;sit to/from stand;sitting/lateral leans   OT Frequency:  Min 2X/week    Co-evaluation              AM-PAC OT 6 Clicks Daily Activity     Outcome Measure Help from another person eating meals?: None Help from another person taking care of personal grooming?: A Little Help from another person toileting, which includes using toliet, bedpan, or urinal?: A Little Help from another  person bathing (including washing, rinsing, drying)?: A Little Help from another person to put on and taking off regular upper body clothing?: A Little Help from another person to put on and taking off regular lower body clothing?: A Little 6 Click Score: 19   End of Session Equipment Utilized During Treatment: Rolling walker (2 wheels)  Activity  Tolerance: Patient tolerated treatment well Patient left: in chair;with call bell/phone within reach;with chair alarm set  OT Visit Diagnosis: Muscle weakness (generalized) (M62.81);Pain;Other abnormalities of gait and mobility (R26.89) Pain - Right/Left: Left Pain - part of body: Hip                Time: 8761-8691 OT Time Calculation (min): 30 min Charges:  OT General Charges $OT Visit: 1 Visit OT Evaluation $OT Eval Moderate Complexity: 1 Mod OT Treatments $Self Care/Home Management : 8-22 mins  Mliss NOVAK, OTR/L Acute Rehab Services Office: [phone]   Mliss Fish 01/22/2024, 2:06 PM

## 2024-01-22 NOTE — Progress Notes (Signed)
 Orthopedic Tech Progress Note Patient Details:  Jonathon Anderson [birth year] [id number]  Ortho Devices Type of Ortho Device: Abduction pillow Ortho Device/Splint Location: hip Ortho Device/Splint Interventions: Ordered, Adjustment, Application   Post Interventions Patient Tolerated: Well Instructions Provided: Adjustment of device  [name] PARAS 01/22/2024, 1:29 AM

## 2024-01-22 NOTE — Plan of Care (Signed)

## 2024-01-22 NOTE — Progress Notes (Signed)
 HD#3 SUBJECTIVE:  Patient Summary: Jonathon Anderson is a 68 y.o. 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 He presented to ED for worsening L left pain at site of surgery and was admitted for L hip prosthetic dislocation with 2x attempted closed reductions.   Overnight Events and Interim History:   Patient is post-op day 1 today.   When seen overnight post procedure, patient was oriented to self and date only (per PACU nurse he was still drowsy from anesthesia. He denied any pain overnight.  This morning, Jonathon Anderson was tachycardic and hypertensive, likely due to pain levels as reported by nursing. Pain medication was administered and when patient was seen at bedside he had complaints of mild pain at surgical site, but no CP or SOB.   Patient is also being worked up for new anemia. Patient reported no blood in stool/urine. Patient's last colonoscopy was in 2001, which per patient's verbal history was normal and without polyps. Anemia workup was ordered and completed.  Patient's cough was also addressed. He mentioned that this is a chronic problem for him and he is following up with his PCP.   Patient has not had a bowel movement since Sunday. Patient denies N/V. Patient appetite is good.    OBJECTIVE:  Vital Signs: Vitals:   01/22/24 0459 01/22/24 0610 01/22/24 0857 01/22/24 0859  BP: (!) 155/70 (!) 152/75 (!) 153/70 (!) 153/70  Pulse: (!) 118 (!) 117 (!) 109   Resp:   16   Temp: 98.5 F (36.9 C)  98.1 F (36.7 C)   TempSrc: Oral  Oral   SpO2: 93% 94% 91%   Weight:      Height:       Supplemental O2: 92% on RA   Filed Weights   01/18/24 1335 01/19/24 0733 01/21/24 1346  Weight: 101 kg 101 kg 101 kg     Intake/Output Summary (Last 24 hours) at 01/22/2024 1143 Last data filed at 01/22/2024 0300 Gross per 24 hour  Intake 1000 ml  Output 750 ml  Net  250 ml   Net IO Since Admission: 15 mL [01/22/24 1143]  Physical Exam: Physical Exam  Cardiovascular:     Rate and Rhythm: Regular rhythm. Tachycardia present.     Heart sounds: Normal heart sounds.  Pulmonary:     Effort: Pulmonary effort is normal.     Breath sounds: Rhonchi (clears with cough) present.  Abdominal:     Palpations: Abdomen is soft.   Musculoskeletal:     Right hip: Normal.     Left hip: Tenderness (site of surgery, patient had post-op hip abduction orthosis on while seen at bedside) present.   Skin:    General: Skin is warm and dry.   Neurological:     Mental Status: He is alert.   Psychiatric:        Mood and Affect: Mood normal.        Behavior: Behavior normal.     Patient Lines/Drains/Airways Status     Active Line/Drains/Airways     Name Placement date Placement time Site Days   Peripheral IV 01/18/24 20 G Right Antecubital 01/18/24  1523  Antecubital  4   Peripheral IV 01/21/24 18 G 1.16 Left Antecubital 01/21/24  1427  Antecubital  1   Negative Pressure Wound Therapy Anterior;Left 01/21/24  1830  --  1  Wound 01/09/24 1412 Surgical Closed Surgical Incision Hip Left 01/09/24  1412  Hip  13   Wound 01/21/24 1829 Surgical Closed Surgical Incision Hip Left 01/21/24  1829  Hip  1            Pertinent labs and imaging:      Latest Ref Rng & Units 01/22/2024    6:15 AM 01/21/2024    7:35 AM 01/20/2024    9:12 AM  CBC  WBC 4.0 - 10.5 K/uL 20.7  11.3  13.0   Hemoglobin 13.0 - 17.0 g/dL 7.8  7.8  8.3   Hematocrit 39.0 - 52.0 % 24.7  24.3  26.3   Platelets 150 - 400 K/uL 445  327  370        Latest Ref Rng & Units 01/22/2024    6:15 AM 01/21/2024    5:00 AM 01/20/2024    9:12 AM  CMP  Glucose 70 - 99 mg/dL 862  888  873   BUN 8 - 23 mg/dL 5  <5  <5   Creatinine 0.61 - 1.24 mg/dL 9.35  9.34  9.25   Sodium 135 - 145 mmol/L 131  130  134   Potassium 3.5 - 5.1 mmol/L 4.4  3.5  3.1   Chloride 98 - 111 mmol/L 93  93  95   CO2 22 - 32 mmol/L  28  30  28    Calcium  8.9 - 10.3 mg/dL 8.0  7.5  8.1     DG HIP UNILAT W OR W/O PELVIS 2-3 VIEWS LEFT Result Date: 01/21/2024 CLINICAL DATA:  Postop. EXAM: DG HIP (WITH OR WITHOUT PELVIS) 2-3V LEFT COMPARISON:  Radiograph 12/30/2023, CT 01/20/2024 FINDINGS: The femoral head component of hip arthroplasty is now seated in the acetabular component. Unchanged displaced fracture on the proximal femoral shaft. Two screws adjacent to the femoral stem are unchanged, overlying the soft tissues. Overlying wound VAC and skin staples in place. IMPRESSION: Relocated left hip arthroplasty. Electronically Signed   By: Andrea Gasman M.D.   On: 01/21/2024 20:22    ASSESSMENT/PLAN:  Assessment: Principal Problem:   Dislocated hip, left, initial encounter [facility]) Active Problems:   Hypokalemia   Displacement of internal left hip prosthesis (HCC)   Essential hypertension  Jonathon Anderson is a 69 year old male with medical history HTN, HLD, TIA, s/p L hip dislocation and open reduction, and anemia.   Plan: #Dislocation of internal left Hip prothesis  -post op day 1 Open Reduction -Pain Control: Acetaminophen  PRN, Hydromorphone  Injection (0.5-1 mg every 4 hours for severe pain), and oxycodone  (5-10 mg every 4 hours for moderate pain).  -instructed to use hip abduction orthosis for 6 weeks  -Dressing: Prevena wound vac dressing to remain on for 1 week -Patient to f/u with Dr. Edna at [facility] in 1 week after surgery for wound check -Patient to start ASA 81 BID starting POD1 -PT mentions that patient was able to ambulate in room for short distance with post-operative weakness and minimal difficulty mobilizing, antalgic gait, and decreased activity tolerance.  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  Verbal history mentioned that results were WNL and without polyps. Patient denies blood in stool or urine.  -will talk to patient about following up with GI for colon cancer screening  -IV Folate and Fe PO administered. Should follow up with PCP   #Leukocytosis  -WBC 20.7 -monitor for signs of infection, likely reactive or due to chest congestion.  -Giving Duoneb and Mucinex   #Essential HTN and Tachycardia -BP likely due as well to pain.  -on Amlodipine  10 mg daily -Patient has not been on metoprolol  this hospital admission, however it is a home medication. Patient on Metoprolol  100 mg BID at home. He will start metoprolol  50 mg BID  with intention to titrate up to home dose before discharge. With start of Beta blocker, we are following anemia with additional labs as we will not be able to monitor based with tachycardia.   #Hypokalemia (resolved)    Best Practice: Diet: Regular diet IVF: None VTE: SCDs Start: 01/19/24 0933 SCDs Start: 01/18/24 1633 Code: Full DISPO: Anticipated discharge in 2 days to home with home health PT pending. Waiting for stabilization of anemia   Signature:  Sallyanne Primas, DO [facility] (PGY-1) 11:43 AM, 01/22/2024  On Call pager [phone]

## 2024-01-22 NOTE — Evaluation (Signed)
 Physical Therapy Evaluation Patient Details Name: Jonathon Anderson MRN: [id number] DOB: 08/29/1955 Today's Date: 01/22/2024  History of Present Illness  68 yo male presents to [facility] ED on 6/27 from ortho office s/p fall L hip dislocation. S/p L closed reduction L hip attempt in OR 6/28, not successful; s/p L open hip reduction 6/30. On 6/18, S/p conversion to L THA with posterior hip precautions given L intertrochanteric femur fx nonunion; d/c home 6/23. PMH includes recurrent L hip surgery since 2023, HTN, HLD, TIA, CVA with residual LUE n/t.  Clinical Impression   Pt presents with min L hip discomfort, impaired bed mobility, post-operative weakness, min difficulty mobilizing, antalgic gait, and decreased activity tolerance. Pt to benefit from acute PT to address deficits. Pt ambulated short room distance, overall requires close guard for safety but is requiring some physical assist for in/out of bed. Pt states his sister is at home 24/7 and can assist at d/c. Pt educated on ankle pumps (20/hour) to perform this afternoon/evening to increase circulation, to pt's tolerance and limited by pain. PT recommending HHPT to address deficits post-acutely. PT to progress mobility as tolerated, and will continue to follow acutely.             If plan is discharge home, recommend the following: A little help with walking and/or transfers;A little help with bathing/dressing/bathroom;Assistance with cooking/housework;Assist for transportation;Help with stairs or ramp for entrance   Can travel by private vehicle        Equipment Recommendations None recommended by PT  Recommendations for Other Services       Functional Status Assessment Patient has had a recent decline in their functional status and demonstrates the ability to make significant improvements in function in a reasonable and predictable amount of time.     Precautions / Restrictions Precautions Precautions: Posterior Hip;Fall Precaution  Booklet Issued: Yes (comment) Precaution/Restrictions Comments: pt recalls 3/3 posterior hip precautions with min cues; wound vac Required Braces or Orthoses: Other Brace Other Brace: hip abduction brace applied by hanger Restrictions Weight Bearing Restrictions Per Provider Order: No LLE Weight Bearing Per Provider Order: Weight bearing as tolerated      Mobility  Bed Mobility Overal bed mobility: Needs Assistance Bed Mobility: Supine to Sit     Supine to sit: Mod assist, HOB elevated, Used rails     General bed mobility comments: assist for LLE progression to EOB, trunk elevation, pt with use of HOB elevation and bedrails. Cues for sequencing    Transfers Overall transfer level: Needs assistance Equipment used: Rolling walker (2 wheels) Transfers: Sit to/from Stand Sit to Stand: Contact guard assist           General transfer comment: close guard for safety, cues for correct hand placement when rising and sitting.    Ambulation/Gait Ambulation/Gait assistance: Contact guard assist Gait Distance (Feet): 15 Feet Assistive device: Rolling walker (2 wheels) Gait Pattern/deviations: Step-through pattern, Decreased stride length, Trunk flexed, Antalgic Gait velocity: decr     General Gait Details: close guard for safety, chair follow. Cues for upright posture, placement in RW, sequencing gait  Stairs            Wheelchair Mobility     Tilt Bed    Modified Rankin (Stroke Patients Only)       Balance Overall balance assessment: Needs assistance Sitting-balance support: No upper extremity supported, Feet supported Sitting balance-Leahy Scale: Fair     Standing balance support: Bilateral upper extremity supported, During functional activity, Reliant on  assistive device for balance Standing balance-Leahy Scale: Poor                               Pertinent Vitals/Pain Pain Assessment Pain Assessment: Faces Faces Pain Scale: Hurts a little  bit Pain Location: L hip Pain Descriptors / Indicators: Aching, Sore, Operative site guarding Pain Intervention(s): Monitored during session, Repositioned, Limited activity within patient's tolerance    Home Living Family/patient expects to be discharged to:: Private residence Living Arrangements: Other relatives (He lives alone, however plans to stay with his sister upon hospital discharge. Household layout is based on his sister's home.) Available Help at Discharge: Family;Available 24 hours/day Type of Home: Mobile home Home Access: Stairs to enter Entrance Stairs-Rails: Right;Left Entrance Stairs-Number of Steps: 6   Home Layout: One level Home Equipment: Control and instrumentation engineer (2 wheels);Cane - single point Lexicographer)      Prior Function Prior Level of Function : Independent/Modified Independent;Driving             Mobility Comments: using RW PTA since previous surgery       Extremity/Trunk Assessment   Upper Extremity Assessment Upper Extremity Assessment: Defer to OT evaluation    Lower Extremity Assessment Lower Extremity Assessment: Generalized weakness;LLE deficits/detail LLE Deficits / Details: anticipated post-operative weakness; at least 3/5 knee extension, DF/PF when screened EOB LLE: Unable to fully assess due to immobilization;Unable to fully assess due to pain       Communication   Communication Communication: Impaired Factors Affecting Communication: Hearing impaired    Cognition Arousal: Alert Behavior During Therapy: WFL for tasks assessed/performed   PT - Cognitive impairments: No apparent impairments                       PT - Cognition Comments: good recall of precautions when given min cues, pleasant affect Following commands: Intact       Cueing Cueing Techniques: Verbal cues, Gestural cues     General Comments General comments (skin integrity, edema, etc.): hip abd brace donned throughout session     Exercises     Assessment/Plan    PT Assessment Patient needs continued PT services  PT Problem List Decreased strength;Decreased safety awareness;Decreased mobility;Decreased activity tolerance;Decreased balance;Decreased knowledge of use of DME;Pain;Decreased knowledge of precautions       PT Treatment Interventions DME instruction;Gait training;Stair training;Functional mobility training;Therapeutic activities;Therapeutic exercise;Balance training;Patient/family education    PT Goals (Current goals can be found in the Care Plan section)  Acute Rehab PT Goals Patient Stated Goal: go home with sister's help PT Goal Formulation: With patient Time For Goal Achievement: 02/05/24 Potential to Achieve Goals: Good    Frequency Min 3X/week     Co-evaluation               AM-PAC PT 6 Clicks Mobility  Outcome Measure Help needed turning from your back to your side while in a flat bed without using bedrails?: A Little Help needed moving from lying on your back to sitting on the side of a flat bed without using bedrails?: A Little Help needed moving to and from a bed to a chair (including a wheelchair)?: A Little Help needed standing up from a chair using your arms (e.g., wheelchair or bedside chair)?: A Little Help needed to walk in hospital room?: A Little Help needed climbing 3-5 steps with a railing? : A Lot 6 Click Score: 17    End of  Session Equipment Utilized During Treatment: Gait belt;Other (comment) (hip abd brace) Activity Tolerance: Patient limited by fatigue Patient left: in chair;with call bell/phone within reach;with chair alarm set Nurse Communication: Mobility status PT Visit Diagnosis: Difficulty in walking, not elsewhere classified (R26.2);Unsteadiness on feet (R26.81)    Time: 1022-1100 PT Time Calculation (min) (ACUTE ONLY): 38 min   Charges:   PT Evaluation $PT Eval Low Complexity: 1 Low PT Treatments $Gait Training: 8-22 mins PT General  Charges $$ ACUTE PT VISIT: 1 Visit         Johana RAMAN, PT DPT Acute Rehabilitation Services Secure Chat Preferred  Office [phone]   Mayzie Caughlin E Johna 01/22/2024, 11:41 AM

## 2024-01-23 DIAGNOSIS — R030 Elevated blood-pressure reading, without diagnosis of hypertension: Secondary | ICD-10-CM

## 2024-01-23 LAB — CBC WITH DIFFERENTIAL/PLATELET
Abs Immature Granulocytes: 0.12 10*3/uL — ABNORMAL HIGH (ref 0.00–0.07)
Basophils Absolute: 0 10*3/uL (ref 0.0–0.1)
Basophils Relative: 0 %
Eosinophils Absolute: 0.2 10*3/uL (ref 0.0–0.5)
Eosinophils Relative: 2 %
HCT: 22.9 % — ABNORMAL LOW (ref 39.0–52.0)
Hemoglobin: 7.3 g/dL — ABNORMAL LOW (ref 13.0–17.0)
Immature Granulocytes: 1 %
Lymphocytes Relative: 9 %
Lymphs Abs: 1.3 10*3/uL (ref 0.7–4.0)
MCH: 29.6 pg (ref 26.0–34.0)
MCHC: 31.9 g/dL (ref 30.0–36.0)
MCV: 92.7 fL (ref 80.0–100.0)
Monocytes Absolute: 1 10*3/uL (ref 0.1–1.0)
Monocytes Relative: 7 %
Neutro Abs: 12 10*3/uL — ABNORMAL HIGH (ref 1.7–7.7)
Neutrophils Relative %: 81 %
Platelets: 388 10*3/uL (ref 150–400)
RBC: 2.47 MIL/uL — ABNORMAL LOW (ref 4.22–5.81)
RDW: 14.7 % (ref 11.5–15.5)
WBC: 14.6 10*3/uL — ABNORMAL HIGH (ref 4.0–10.5)
nRBC: 0 % (ref 0.0–0.2)

## 2024-01-23 MED ORDER — FOLIC ACID 1 MG PO TABS
1.0000 mg | ORAL_TABLET | Freq: Every day | ORAL | Status: DC
  Administered 2024-01-23 – 2024-01-24 (×2): 1 mg via ORAL
  Filled 2024-01-23 (×2): qty 1

## 2024-01-23 MED ORDER — POLYETHYLENE GLYCOL 3350 17 G PO PACK
17.0000 g | PACK | Freq: Two times a day (BID) | ORAL | Status: DC
  Administered 2024-01-23 – 2024-01-24 (×2): 17 g via ORAL
  Filled 2024-01-23 (×2): qty 1

## 2024-01-23 MED ORDER — IRON SUCROSE 500 MG IVPB - SIMPLE MED
500.0000 mg | Freq: Once | INTRAVENOUS | Status: DC
  Filled 2024-01-23: qty 275

## 2024-01-23 MED ORDER — ASPIRIN 81 MG PO TBEC: 81.0000 mg | DELAYED_RELEASE_TABLET | Freq: Two times a day (BID) | ORAL | Status: DC

## 2024-01-23 MED ORDER — POLYETHYLENE GLYCOL 3350 17 G PO PACK
17.0000 g | PACK | Freq: Every day | ORAL | Status: DC
  Administered 2024-01-23: 17 g via ORAL
  Filled 2024-01-23: qty 1

## 2024-01-23 MED ORDER — CEFADROXIL 500 MG PO CAPS: 500.0000 mg | ORAL_CAPSULE | Freq: Two times a day (BID) | ORAL | 0 refills | Status: AC

## 2024-01-23 MED ORDER — METOPROLOL TARTRATE 50 MG PO TABS
75.0000 mg | ORAL_TABLET | Freq: Two times a day (BID) | ORAL | Status: DC
  Administered 2024-01-23 – 2024-01-24 (×2): 75 mg via ORAL
  Filled 2024-01-23 (×3): qty 1

## 2024-01-23 MED ORDER — SODIUM CHLORIDE 0.9 % IV SOLN
500.0000 mg | Freq: Once | INTRAVENOUS | Status: AC
  Administered 2024-01-23: 500 mg via INTRAVENOUS
  Filled 2024-01-23: qty 25

## 2024-01-23 MED ORDER — OXYCODONE HCL 5 MG PO TABS: 5.0000 mg | ORAL_TABLET | ORAL | 0 refills | Status: AC | PRN

## 2024-01-23 NOTE — Progress Notes (Addendum)
 Physical Therapy Treatment Patient Details Name: Jonathon Anderson MRN: [id number] DOB: April 28, 1956 Today's Date: 01/23/2024   History of Present Illness 68 yo male presents to [facility] ED on 6/27 from ortho office s/p fall L hip dislocation. S/p L closed reduction L hip attempt in OR 6/28, not successful; s/p L open hip reduction 6/30. On 6/18, S/p conversion to L THA with posterior hip precautions given L intertrochanteric femur fx nonunion; d/c home 6/23. PMH includes recurrent L hip surgery since 2023, HTN, HLD, TIA, CVA with residual LUE n/t.    PT Comments  Pt resting in bed on arrival, agreeable to session with slow progress towards acute goals. Pt continues to be limited in safe mobility by decreased activity tolerance, weakness, poor balance/postural reactions, and  poor sequencing and initiation. Pt able to recall 0/3 hip precautions and requiring cues throughout mobility for adherence. Pt able to come to sitting EOB with mod A with step by step cues for sequencing and physical assist to manage Les and trunk. Pt needing min A to boost to stand from elevated EOB and min A to steady during short in room gait with RW for support before max fatigue and pt needing to sit. Pt unable to progress stair training this session due to fatigue. Pending progress pt may need continued inpatient follow up therapy, <3 hours/day vs HHPT, will cotninue to follow and progress mobility as tolerated.    If plan is discharge home, recommend the following: A little help with walking and/or transfers;A little help with bathing/dressing/bathroom;Assistance with cooking/housework;Assist for transportation;Help with stairs or ramp for entrance   Can travel by private vehicle        Equipment Recommendations  None recommended by PT    Recommendations for Other Services       Precautions / Restrictions Precautions Precautions: Posterior Hip;Fall Precaution Booklet Issued: Yes (comment) Recall of  Precautions/Restrictions: Impaired (pt able to recall 0/3 hip precautions) Precaution/Restrictions Comments: wound vac Required Braces or Orthoses: Other Brace Other Brace: hip abduction brace applied by hanger, hip abduction pillow in bed Restrictions Weight Bearing Restrictions Per Provider Order: No LLE Weight Bearing Per Provider Order: Weight bearing as tolerated     Mobility  Bed Mobility Overal bed mobility: Needs Assistance Bed Mobility: Supine to Sit     Supine to sit: Mod assist, HOB elevated, Used rails     General bed mobility comments: mod A to bring LEs to and off EOB and to elevat trunk, cues throughout for adherence to precautions and sequencing    Transfers Overall transfer level: Needs assistance Equipment used: Rolling walker (2 wheels) Transfers: Sit to/from Stand Sit to Stand: Min assist           General transfer comment: min A to power up to stand from eleavte EOB, pt with good hand placement using single UE to push from EOB,    Ambulation/Gait Ambulation/Gait assistance: Min assist Gait Distance (Feet): 15 Feet Assistive device: Rolling walker (2 wheels) Gait Pattern/deviations: Step-through pattern, Decreased stride length, Trunk flexed, Antalgic Gait velocity: decr     General Gait Details: min A to maintain balance with cues for upright posture and closer RW proximity   Stairs Stairs:  (unable to progress this session due to fatigue)           Wheelchair Mobility     Tilt Bed    Modified Rankin (Stroke Patients Only)       Balance Overall balance assessment: Needs assistance Sitting-balance support: No upper  extremity supported, Feet supported Sitting balance-Leahy Scale: Fair     Standing balance support: Bilateral upper extremity supported, During functional activity, Reliant on assistive device for balance Standing balance-Leahy Scale: Poor Standing balance comment: heavy reliance on UE support                             Communication Communication Communication: Impaired Factors Affecting Communication: Hearing impaired  Cognition Arousal: Alert Behavior During Therapy: WFL for tasks assessed/performed   PT - Cognitive impairments: No family/caregiver present to determine baseline, Initiation, Sequencing, Problem solving, Memory                       PT - Cognition Comments: pt able to recall 0/3 precautions, needing step by step cues for bed mobility and max cues for safety with ambulation, pt perseverating on the word okay throughout session Following commands: Impaired (increased time needed for processing)      Cueing Cueing Techniques: Verbal cues, Gestural cues, Tactile cues  Exercises      General Comments General comments (skin integrity, edema, etc.): pt needing max A to don hip brace      Pertinent Vitals/Pain Pain Assessment Pain Assessment: Faces Faces Pain Scale: Hurts a little bit Pain Location: L hip Pain Descriptors / Indicators: Grimacing, Discomfort Pain Intervention(s): Premedicated before session, Monitored during session, Limited activity within patient's tolerance    Home Living                          Prior Function            PT Goals (current goals can now be found in the care plan section) Acute Rehab PT Goals PT Goal Formulation: With patient Time For Goal Achievement: 02/05/24 Progress towards PT goals: Not progressing toward goals - comment    Frequency    Min 3X/week      PT Plan      Co-evaluation              AM-PAC PT 6 Clicks Mobility   Outcome Measure  Help needed turning from your back to your side while in a flat bed without using bedrails?: A Lot Help needed moving from lying on your back to sitting on the side of a flat bed without using bedrails?: A Lot Help needed moving to and from a bed to a chair (including a wheelchair)?: A Little Help needed standing up from a chair using your  arms (e.g., wheelchair or bedside chair)?: A Little Help needed to walk in hospital room?: A Lot (<20') Help needed climbing 3-5 steps with a railing? : Total 6 Click Score: 13    End of Session Equipment Utilized During Treatment: Gait belt;Other (comment) (hip abd brace) Activity Tolerance: Patient limited by fatigue Patient left: in chair;with call bell/phone within reach;with chair alarm set Nurse Communication: Mobility status;Other (comment) (pt may neede SNF pending progress) PT Visit Diagnosis: Difficulty in walking, not elsewhere classified (R26.2);Unsteadiness on feet (R26.81)     Time: 9175-9149 PT Time Calculation (min) (ACUTE ONLY): 26 min  Charges:    $Gait Training: 8-22 mins $Therapeutic Activity: 8-22 mins PT General Charges $$ ACUTE PT VISIT: 1 Visit                     Riki Gehring R. PTA Acute Rehabilitation Services Office: [phone]   Therisa CHRISTELLA Boor 01/23/2024, 9:05 AM

## 2024-01-23 NOTE — Progress Notes (Signed)
 Occupational Therapy Treatment Patient Details Name: Jonathon Anderson MRN: [id number] DOB: Dec 08, 1955 Today's Date: 01/23/2024   History of present illness 68 yo male presents to [facility] ED on 6/27 from ortho office s/p fall L hip dislocation. S/p L closed reduction L hip attempt in OR 6/28, not successful; s/p L open hip reduction 6/30. On 6/18, S/p conversion to L THA with posterior hip precautions given L intertrochanteric femur fx nonunion; d/c home 6/23. PMH includes recurrent L hip surgery since 2023, HTN, HLD, TIA, CVA with residual LUE n/t.   OT comments  OT session focused on training in techniques for increased safety and impendence with ADLs and functional transfers during ADLs. Pt currently demonstrating ability to complete UB ADLs largely Independent to Set up/Supervision, LB ADLs with Set up to Mod assist, and functional transfers/mobility with a RW with Min to Mod assist. Pt requiring Max cues throughout session for safety and sequencing. Pt able to state 2/3 hip precautions this session. Pt with brief O2 sat drop to 87% on RA following ambulation with pt requiring cues for pursed lip breathing to recover O2 sat to 96% on RA with sitting reset break. VS otherwise stable throughout session. Pt participated well in session and is making slow but consistent progress toward goals. Pt limited this session by fatigue. Pt will benefit from continued acute skilled OT services to address deficits outlined below and increase safety and independence with functional tasks. Pending progress pt may need continued inpatient follow up therapy, <3 hours/day vs HH OT to increased safety and independence with functional tasks, decrease caregiver burden, decrease risk of falls, and decrease risk of rehospitalization.       If plan is discharge home, recommend the following:  A lot of help with walking and/or transfers;Assistance with cooking/housework;A little help with bathing/dressing/bathroom;Direct  supervision/assist for medications management;Direct supervision/assist for financial management;Assist for transportation;Help with stairs or ramp for entrance   Equipment Recommendations  BSC/3in1    Recommendations for Other Services      Precautions / Restrictions Precautions Precautions: Posterior Hip;Fall Precaution Booklet Issued: Yes (comment) Recall of Precautions/Restrictions: Impaired (able to recall 2/3 this session with increased time) Precaution/Restrictions Comments: wound vac Required Braces or Orthoses: Other Brace Other Brace: hip abduction brace applied by hanger, hip abduction pillow in bed Restrictions Weight Bearing Restrictions Per Provider Order: No LLE Weight Bearing Per Provider Order: Weight bearing as tolerated       Mobility Bed Mobility Overal bed mobility: Needs Assistance             General bed mobility comments: Pt sitting in recliner at beginning and end of session    Transfers Overall transfer level: Needs assistance Equipment used: Rolling walker (2 wheels) Transfers: Sit to/from Stand, Bed to chair/wheelchair/BSC Sit to Stand: Min assist, Mod assist     Step pivot transfers: Min assist, Mod assist     General transfer comment: Min assist to power up from recliner and Mod assist to power up from comfort height toilet with use of grab bar, otherwise Min assist; pt requiring cues for hand placement, technique, and safety     Balance Overall balance assessment: Needs assistance Sitting-balance support: No upper extremity supported, Feet supported Sitting balance-Leahy Scale: Fair     Standing balance support: Single extremity supported, Bilateral upper extremity supported, During functional activity, Reliant on assistive device for balance Standing balance-Leahy Scale: Poor Standing balance comment: heavy reliance on UE support and requiring additional support from therapist in dynamic standing with  unilateral UE support                            ADL either performed or assessed with clinical judgement   ADL Overall ADL's : Needs assistance/impaired Eating/Feeding: Independent;Sitting               Upper Body Dressing : Set up;Sitting   Lower Body Dressing: Minimal assistance;Sit to/from stand;Sitting/lateral leans;With adaptive equipment;Cueing for sequencing;Cueing for safety   Toilet Transfer: Minimal assistance;Moderate assistance;Ambulation;Rolling walker (2 wheels);Comfort height toilet;Grab bars;Cueing for safety;Cueing for sequencing Toilet Transfer Details (indicate cue type and reason): Mod assist to power up from toilet, otherwise Min assist for safety and equipment management Toileting- Clothing Manipulation and Hygiene: Set up;Minimal assistance;Sitting/lateral lean;Sit to/from stand;Cueing for compensatory techniques;Cueing for sequencing;Cueing for safety Toileting - Clothing Manipulation Details (indicate cue type and reason): Set up for use of handheld urinal in sitting; Min assist for simulated peri-care and clothing management in sit/stand     Functional mobility during ADLs: Minimal assistance;Rolling walker (2 wheels);Cueing for safety General ADL Comments: Pt with decreased activity tolerance, fatiging quickly during tasks    Extremity/Trunk Assessment Upper Extremity Assessment Upper Extremity Assessment: Right hand dominant;Overall [facility] for tasks assessed   Lower Extremity Assessment Lower Extremity Assessment: Defer to PT evaluation        Vision       Perception     Praxis     Communication Communication Communication: Impaired Factors Affecting Communication: Hearing impaired;Other (comment) (requires increased time for processing)   Cognition Arousal: Alert Behavior During Therapy: WFL for tasks assessed/performed Cognition: Cognition impaired, No family/caregiver present to determine baseline     Awareness: Intellectual awareness intact, Online  awareness intact Memory impairment (select all impairments): Short-term memory, Working memory Attention impairment (select first level of impairment): Alternating attention Executive functioning impairment (select all impairments): Organization, Problem solving, Sequencing OT - Cognition Comments: Pt AAOx4 and pleasant throughout session. Pt cognition largely [facility] for tasks assessed but with deficts noted above.                 Following commands: Impaired Following commands impaired: Follows one step commands with increased time, Follows multi-step commands inconsistently, Follows multi-step commands with increased time      Cueing   Cueing Techniques: Verbal cues, Gestural cues, Tactile cues  Exercises      Shoulder Instructions       General Comments Pt with brief O2 sat drop to 87% on RA following ambulation with pt requiring cues for pursed lip breathing to recover O2 sat to 96% on RA with sitting reset break. VS otherwise stable throughout session.    Pertinent Vitals/ Pain       Pain Assessment Pain Assessment: Faces Faces Pain Scale: Hurts little more Pain Location: L hip Pain Descriptors / Indicators: Grimacing, Discomfort Pain Intervention(s): Limited activity within patient's tolerance, Monitored during session, Premedicated before session, Repositioned  Home Living                                          Prior Functioning/Environment              Frequency  Min 2X/week        Progress Toward Goals  OT Goals(current goals can now be found in the care plan section)  Progress towards OT goals: Progressing toward goals  Acute Rehab OT Goals Patient Stated Goal: to discharge to his sister/s house  Plan      Co-evaluation                 AM-PAC OT 6 Clicks Daily Activity     Outcome Measure   Help from another person eating meals?: None Help from another person taking care of personal grooming?: A Little Help from  another person toileting, which includes using toliet, bedpan, or urinal?: A Little Help from another person bathing (including washing, rinsing, drying)?: A Little Help from another person to put on and taking off regular upper body clothing?: A Little Help from another person to put on and taking off regular lower body clothing?: A Little 6 Click Score: 19    End of Session Equipment Utilized During Treatment: Gait belt;Rolling walker (2 wheels);Other (comment) (hip abduction bracer; wound vac)  OT Visit Diagnosis: Other abnormalities of gait and mobility (R26.89);Other symptoms and signs involving cognitive function;Other (comment) (decreased activity tolerance)   Activity Tolerance Patient tolerated treatment well;Patient limited by fatigue   Patient Left in chair;with call bell/phone within reach;with chair alarm set   Nurse Communication Mobility status        Time: 1030-1059 OT Time Calculation (min): 29 min  Charges: OT General Charges $OT Visit: 1 Visit OT Treatments $Self Care/Home Management : 23-37 mins  Margarie Rockey HERO., OTR/L, MA Acute Rehab [phone]   Margarie FORBES Horns 01/23/2024, 1:42 PM

## 2024-01-23 NOTE — Progress Notes (Cosign Needed Addendum)
 HD#4 SUBJECTIVE:  Patient Summary: Jonathon Anderson is a 68 y.o. 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 He presented to ED for worsening L left pain at site of surgery and was admitted for L hip prosthetic dislocation with 2x attempted closed reductions. Worked up for anemia   Overnight Events and Interim History:   Patient is post-op day 2 today. Patient was seen at bedside he had complaints of mild pain at surgical site, wearing brace, no CP or SOB. Patient has not had a bowel movement since Sunday. Patient denies N/V. Patient appetite is good. Doing Incentive Spirometry. PT recommending a little help with walking or transfers, a little help with bathing/dressing/bathroom, help with stairs   OBJECTIVE:  Vital Signs: Vitals:   01/22/24 1953 01/23/24 0436 01/23/24 0508 01/23/24 0735  BP: 129/68 121/70 126/68 139/60  Pulse: 95 91 88 97  Resp: 16 16 16 18   Temp: 99.5 F (37.5 C) 98.1 F (36.7 C) 97.6 F (36.4 C) 98.6 F (37 C)  TempSrc: Oral Oral Oral Oral  SpO2: 90% 97% 91% 94%  Weight:      Height:       Supplemental O2: 92% on RA   Filed Weights   01/18/24 1335 01/19/24 0733 01/21/24 1346  Weight: 101 kg 101 kg 101 kg     Intake/Output Summary (Last 24 hours) at 01/23/2024 1140 Last data filed at 01/23/2024 0400 Gross per 24 hour  Intake 50 ml  Output 525 ml  Net -475 ml   Net IO Since Admission: -610 mL [01/23/24 1140]  Physical Exam: Physical Exam Constitutional:      Comments: Patient fell asleep briefly during questioning   Cardiovascular:     Rate and Rhythm: Normal rate and regular rhythm.  Pulmonary:     Effort: Pulmonary effort is normal.     Breath sounds: Rhonchi (cleared with cough) present.  Abdominal:     Palpations: Abdomen is soft.  Musculoskeletal:        General: Normal range of motion.  Skin:    General: Skin is warm  and dry.  Neurological:     Mental Status: He is alert.  Psychiatric:        Mood and Affect: Mood normal.        Behavior: Behavior normal.     Patient Lines/Drains/Airways Status     Active Line/Drains/Airways     Name Placement date Placement time Site Days   Peripheral IV 01/18/24 20 G Right Antecubital 01/18/24  1523  Antecubital  4   Peripheral IV 01/21/24 18 G 1.16 Left Antecubital 01/21/24  1427  Antecubital  1   Negative Pressure Wound Therapy Anterior;Left 01/21/24  1830  --  1   Wound 01/09/24 1412 Surgical Closed Surgical Incision Hip Left 01/09/24  1412  Hip  13   Wound 01/21/24 1829 Surgical Closed Surgical Incision Hip Left 01/21/24  1829  Hip  1            Pertinent labs and imaging:      Latest Ref Rng & Units 01/23/2024    5:39 AM 01/22/2024    6:15 AM 01/21/2024    7:35 AM  CBC  WBC 4.0 - 10.5 K/uL 14.6  20.7  11.3   Hemoglobin 13.0 - 17.0 g/dL 7.3  7.8  7.8   Hematocrit 39.0 - 52.0 % 22.9  24.7  24.3   Platelets 150 - 400 K/uL 388  445  327        Latest Ref Rng & Units 01/22/2024    6:15 AM 01/21/2024    5:00 AM 01/20/2024    9:12 AM  CMP  Glucose 70 - 99 mg/dL 862  888  873   BUN 8 - 23 mg/dL 5  <5  <5   Creatinine 0.61 - 1.24 mg/dL 9.35  9.34  9.25   Sodium 135 - 145 mmol/L 131  130  134   Potassium 3.5 - 5.1 mmol/L 4.4  3.5  3.1   Chloride 98 - 111 mmol/L 93  93  95   CO2 22 - 32 mmol/L 28  30  28    Calcium  8.9 - 10.3 mg/dL 8.0  7.5  8.1     No results found.   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   Plan: #Dislocation of internal left Hip prothesis  -post op day 1 Open Reduction Internal Fixation -Pain  Control: Acetaminophen  PRN, Hydromorphone  Injection (0.5-1 mg every 4 hours for severe pain), and oxycodone  (5-10 mg every 4 hours for moderate pain).  -instructed to use hip abduction orthosis for 6 weeks  -Dressing: Prevena wound vac dressing to remain on for 1 week -Patient to f/u with Dr. Edna at [facility] in 1 week after surgery for wound check -Patient started ASA 81 BID yesterday (POD1) -PT mentions that patient was able to ambulate in room for short distance with post-operative weakness and minimal difficulty mobilizing, antalgic gait, and decreased activity tolerance.  -Mirlax regimen increased. 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  -Patient mentions last colonoscopy was 2001 done at [facility]. Verbal history mentioned that results were WNL and without polyps. Patient denies blood in stool or urine.  -will talk to patient about following up with GI for colon cancer screening  -started PO Folate and administered IV iron administered today. Should follow up with PCP   #Leukocytosis, trending down -WBC 20.7-->14.6 -monitor for signs of infection, likely reactive or due to chest congestion.  -Giving Duoneb and Mucinex   #Essential HTN and Tachycardia -BP likely due as well to pain.  -on Amlodipine  10 mg daily -Titrated metoprolol  to 75 mg to get to home dose of 100 for discharge (or will remain on 75).    #Hypokalemia (resolved)    Best Practice: Diet: Regular diet IVF: None VTE: SCDs Start: 01/19/24 0933 SCDs Start: 01/18/24 1633 Code: Full DISPO:  Anticipated discharge in 1 days to home with home health PT pending. Waiting for stabilization of anemia   Signature:  Sallyanne Primas, DO [facility] (PGY-1) 11:40 AM, 01/23/2024  On Call pager [phone]

## 2024-01-23 NOTE — Plan of Care (Signed)
   Problem: Clinical Measurements: Goal: Respiratory complications will improve Outcome: Progressing Goal: Cardiovascular complication will be avoided Outcome: Progressing   Problem: Activity: Goal: Risk for activity intolerance will decrease Outcome: Progressing   Problem: Nutrition: Goal: Adequate nutrition will be maintained Outcome: Progressing

## 2024-01-23 NOTE — Care Management Important Message (Signed)
 Important Message  Patient Details  Name: KYMERE FULLINGTON MRN: [id number] Date of Birth: 10-23-1955   Important Message Given:  Yes - Medicare IM     Jon Cruel 01/23/2024, 10:02 AM

## 2024-01-23 NOTE — Progress Notes (Signed)
     Subjective: Patient reports pain as mild.  Lying comfortably in bed this morning.  Has hip abduction brace off this morning but had it on all day yesterday. Making good progress with PT.  Discussed importance of strict posterior hip precautions.  Plan for discharge home once medically stable in the next couple days.    Objective:   VITALS:   Vitals:   01/22/24 1646 01/22/24 1953 01/23/24 0436 01/23/24 0508  BP: 129/65 129/68 121/70 126/68  Pulse: 90 95 91 88  Resp: 16 16 16 16   Temp: 97.8 F (36.6 C) 99.5 F (37.5 C) 98.1 F (36.7 C) 97.6 F (36.4 C)  TempSrc:  Oral Oral Oral  SpO2: 95% 90% 97% 91%  Weight:      Height:        Sensation intact distally Intact pulses distally Dorsiflexion/Plantar flexion intact Incision: dressing C/D/I Compartment soft Wound vac holding suction . No output in cannister.  Lab Results  Component Value Date   WBC 14.6 (H) 01/23/2024   HGB 7.3 (L) 01/23/2024   HCT 22.9 (L) 01/23/2024   MCV 92.7 01/23/2024   PLT 388 01/23/2024   BMET    Component Value Date/Time   NA 131 (L) 01/22/2024 0615   K 4.4 01/22/2024 0615   CL 93 (L) 01/22/2024 0615   CO2 28 01/22/2024 0615   GLUCOSE 137 (H) 01/22/2024 0615   BUN 5 (L) 01/22/2024 0615   CREATININE 0.64 01/22/2024 0615   CALCIUM  8.0 (L) 01/22/2024 0615   GFRNONAA >60 01/22/2024 0615      Xray: Total of arthroplasty components in good position and reduced hip dis.location no adverse features  Assessment/Plan: 2 Days Post-Op   Principal Problem:   Dislocated hip, left, initial encounter [facility]) Active Problems:   Hypokalemia   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 Abx: ancef  extend given higher risk  of infection due to return to OR  Imaging: PACU pelvis Xray Dressing: Prevena wound VAC dressing to remain on for 1 week  DVT prophylaxis: Aspirin  81BID starting POD1 Follow up: 1 week after surgery for a wound check with Dr. Edna at [facility].  Address: [address], [place], [place] [address]  Office Phone: [phone]    TORIBIO DELENA EDNA 01/23/2024, 7:11 AM   TORIBIO Edna, MD  Contact information:   [phone] 7am-5pm epic message Dr. Edna, or call office for patient follow up: [phone] After hours and holidays please check [email or web address] for group call information for [facility]

## 2024-01-24 ENCOUNTER — Other Ambulatory Visit: Payer: Self-pay

## 2024-01-24 DIAGNOSIS — D649 Anemia, unspecified: Secondary | ICD-10-CM

## 2024-01-24 LAB — POCT I-STAT 7, (LYTES, BLD GAS, ICA,H+H)
Acid-Base Excess: 4 mmol/L — ABNORMAL HIGH (ref 0.0–2.0)
Bicarbonate: 30.9 mmol/L — ABNORMAL HIGH (ref 20.0–28.0)
Calcium, Ion: 1.11 mmol/L — ABNORMAL LOW (ref 1.15–1.40)
HCT: 26 % — ABNORMAL LOW (ref 39.0–52.0)
Hemoglobin: 8.8 g/dL — ABNORMAL LOW (ref 13.0–17.0)
O2 Saturation: 95 %
Potassium: 3.7 mmol/L (ref 3.5–5.1)
Sodium: 134 mmol/L — ABNORMAL LOW (ref 135–145)
TCO2: 33 mmol/L — ABNORMAL HIGH (ref 22–32)
pCO2 arterial: 62.6 mmHg — ABNORMAL HIGH (ref 32–48)
pH, Arterial: 7.301 — ABNORMAL LOW (ref 7.35–7.45)
pO2, Arterial: 86 mmHg (ref 83–108)

## 2024-01-24 LAB — CBC
HCT: 22.6 % — ABNORMAL LOW (ref 39.0–52.0)
Hemoglobin: 7.1 g/dL — ABNORMAL LOW (ref 13.0–17.0)
MCH: 29.3 pg (ref 26.0–34.0)
MCHC: 31.4 g/dL (ref 30.0–36.0)
MCV: 93.4 fL (ref 80.0–100.0)
Platelets: 415 10*3/uL — ABNORMAL HIGH (ref 150–400)
RBC: 2.42 MIL/uL — ABNORMAL LOW (ref 4.22–5.81)
RDW: 14.8 % (ref 11.5–15.5)
WBC: 13.8 10*3/uL — ABNORMAL HIGH (ref 4.0–10.5)
nRBC: 0 % (ref 0.0–0.2)

## 2024-01-24 LAB — BASIC METABOLIC PANEL WITH GFR
Anion gap: 9 (ref 5–15)
BUN: <5 mg/dL — ABNORMAL LOW (ref 8–23)
CO2: 29 mmol/L (ref 22–32)
Calcium: 7.7 mg/dL — ABNORMAL LOW (ref 8.9–10.3)
Chloride: 91 mmol/L — ABNORMAL LOW (ref 98–111)
Creatinine, Ser: 0.75 mg/dL (ref 0.61–1.24)
GFR, Estimated: >60 mL/min (ref >60)
Glucose, Bld: 108 mg/dL — ABNORMAL HIGH (ref 70–99)
Potassium: 4 mmol/L (ref 3.5–5.1)
Sodium: 129 mmol/L — ABNORMAL LOW (ref 135–145)

## 2024-01-24 MED ORDER — ONDANSETRON HCL 4 MG PO TABS: 4.0000 mg | ORAL_TABLET | Freq: Four times a day (QID) | ORAL | 0 refills | Status: DC | PRN

## 2024-01-24 MED ORDER — FERROUS SULFATE 325 (65 FE) MG PO TABS: 325.0000 mg | ORAL_TABLET | Freq: Every day | ORAL | 0 refills | Status: DC

## 2024-01-24 MED ORDER — FOLIC ACID 1 MG PO TABS: 1.0000 mg | ORAL_TABLET | Freq: Every day | ORAL | 0 refills | Status: DC

## 2024-01-24 MED ORDER — METOPROLOL TARTRATE 75 MG PO TABS: 75.0000 mg | ORAL_TABLET | Freq: Two times a day (BID) | ORAL | 0 refills | Status: DC

## 2024-01-24 MED ORDER — SENNA 8.6 MG PO TABS: 1.0000 | ORAL_TABLET | Freq: Two times a day (BID) | ORAL | 0 refills | Status: DC

## 2024-01-24 MED ORDER — GUAIFENESIN ER 600 MG PO TB12: 1200.0000 mg | ORAL_TABLET | Freq: Two times a day (BID) | ORAL | 0 refills | Status: DC

## 2024-01-24 MED ORDER — MAGNESIUM CITRATE PO SOLN
1.0000 | Freq: Once | ORAL | Status: AC
  Administered 2024-01-24: 1 via ORAL
  Filled 2024-01-24: qty 296

## 2024-01-24 MED ORDER — POLYETHYLENE GLYCOL 3350 17 G PO PACK: 17.0000 g | PACK | Freq: Two times a day (BID) | ORAL | 0 refills | Status: DC

## 2024-01-24 MED ORDER — MENTHOL 3 MG MT LOZG: 1.0000 | LOZENGE | OROMUCOSAL | 12 refills | Status: DC | PRN

## 2024-01-24 NOTE — Progress Notes (Signed)
 Physical Therapy Treatment Patient Details Name: Jonathon Anderson MRN: [id number] DOB: [birth year] Today's Date: 01/24/2024   History of Present Illness 68 yo male presents to [facility] ED on 6/27 from ortho office s/p fall L hip dislocation. S/p L closed reduction L hip attempt in OR 6/28, not successful; s/p L open hip reduction 6/30. On 6/18, S/p conversion to L THA with posterior hip precautions given L intertrochanteric femur fx nonunion; d/c home 6/23. PMH includes recurrent L hip surgery since 2023, HTN, HLD, TIA, CVA with residual LUE n/t.    PT Comments  Pt supine in bed on arrival this session.  He required cueing to recall 3/3 precautions.  Better mobility and increased gt distance this session.  Pt continues to wear hip abduction brace.  Velcro seems off around waist but all other parts appear in correct position.  Continue to follow during acute stay.  Pt declined stairs this pm d/t fatigue.    If plan is discharge home, recommend the following: A little help with walking and/or transfers;A little help with bathing/dressing/bathroom;Assistance with cooking/housework;Assist for transportation;Help with stairs or ramp for entrance   Can travel by private vehicle        Equipment Recommendations  None recommended by PT    Recommendations for Other Services       Precautions / Restrictions Precautions Precautions: Posterior Hip;Fall Precaution Booklet Issued: Yes (comment) Precaution/Restrictions Comments: wound vac Required Braces or Orthoses: Other Brace Other Brace: hip abduction brace applied by hanger, hip abduction pillow in bed Restrictions Weight Bearing Restrictions Per Provider Order: No LLE Weight Bearing Per Provider Order: Weight bearing as tolerated     Mobility  Bed Mobility Overal bed mobility: Needs Assistance Bed Mobility: Supine to Sit, Sit to Supine     Supine to sit: HOB elevated, Min assist     General bed mobility comments: Pt required assistance for  trunk elevation into sitting.  he was able to advance LEs and follow hip precautions to edge of bed.    Transfers Overall transfer level: Needs assistance Equipment used: Rolling walker (2 wheels) Transfers: Sit to/from Stand Sit to Stand: Min assist, From elevated surface           General transfer comment: Cues for hand placement and forward advancement of LLE forward to maintain posterior hip precautions.  Pt able to control descent to seated surface,    Ambulation/Gait Ambulation/Gait assistance: Contact guard assist Gait Distance (Feet): 25 Feet Assistive device: Rolling walker (2 wheels) Gait Pattern/deviations: Step-through pattern, Decreased stride length, Trunk flexed, Antalgic Gait velocity: decr     General Gait Details: Minor instability but no overt LOB, cues for sequencing, upper trunk control and RW proximity.   Stairs             Wheelchair Mobility     Tilt Bed    Modified Rankin (Stroke Patients Only)       Balance Overall balance assessment: Needs assistance Sitting-balance support: No upper extremity supported, Feet supported Sitting balance-Leahy Scale: Fair       Standing balance-Leahy Scale: Poor                              Communication Communication Communication: Impaired Factors Affecting Communication: Hearing impaired;Other (comment) (required increased time for processing.)  Cognition Arousal: Alert Behavior During Therapy: WFL for tasks assessed/performed   PT - Cognitive impairments: No family/caregiver present to determine baseline, Initiation, Sequencing, Problem solving, Memory  PT - Cognition Comments: Pt able to recall 2/3 precautions without cueing.  He required cues during session to maintain and follow these precautions. Following commands: Impaired Following commands impaired: Follows one step commands with increased time, Follows multi-step commands inconsistently,  Follows multi-step commands with increased time    Cueing Cueing Techniques: Verbal cues, Gestural cues, Tactile cues  Exercises      General Comments        Pertinent Vitals/Pain Pain Assessment Pain Assessment: Faces Faces Pain Scale: Hurts little more Pain Location: L hip Pain Descriptors / Indicators: Grimacing, Discomfort Pain Intervention(s): Monitored during session, Repositioned    Home Living                          Prior Function            PT Goals (current goals can now be found in the care plan section) Acute Rehab PT Goals Patient Stated Goal: go home with sister's help Potential to Achieve Goals: Good Progress towards PT goals: Progressing toward goals    Frequency    Min 3X/week      PT Plan      Co-evaluation              AM-PAC PT 6 Clicks Mobility   Outcome Measure  Help needed turning from your back to your side while in a flat bed without using bedrails?: A Lot Help needed moving from lying on your back to sitting on the side of a flat bed without using bedrails?: A Lot Help needed moving to and from a bed to a chair (including a wheelchair)?: A Little Help needed standing up from a chair using your arms (e.g., wheelchair or bedside chair)?: A Little Help needed to walk in hospital room?: A Lot Help needed climbing 3-5 steps with a railing? : Total 6 Click Score: 13    End of Session Equipment Utilized During Treatment: Gait belt Activity Tolerance: Patient limited by fatigue Patient left: in chair;with call bell/phone within reach;with chair alarm set Nurse Communication: Mobility status PT Visit Diagnosis: Difficulty in walking, not elsewhere classified (R26.2);Unsteadiness on feet (R26.81)     Time: 8692-8670 PT Time Calculation (min) (ACUTE ONLY): 22 min  Charges:    $Gait Training: 8-22 mins PT General Charges $$ ACUTE PT VISIT: 1 Visit                     Toya HAMS , PTA Acute Rehabilitation  Services Office [phone]    Hosam Mcfetridge JINNY Gosling 01/24/2024, 2:36 PM

## 2024-01-24 NOTE — Discharge Summary (Addendum)
 Name: Jonathon Anderson MRN: [id number] DOB: Jul 07, 1956 68 y.o. PCP: Jonathon Charlene LITTIE, MD  Date of Admission: 01/18/2024  1:30 PM Date of Discharge: 01/24/2024 Attending Physician: Dr. MICAEL Riis Anderson  Discharge Diagnosis: 1. Principal Problem:   Dislocated hip, left, initial encounter [facility]) Active Problems:   Hypokalemia   Displacement of internal left hip prosthesis (HCC)   Essential hypertension   Elevated blood pressure reading   Discharge Medications: Allergies as of 01/24/2024   No Known Allergies      Medication List     TAKE these medications    acetaminophen  500 MG tablet Commonly known as: TYLENOL  Take 2 tablets (1,000 mg total) by mouth every 8 (eight) hours as needed.   amLODipine  10 MG tablet Commonly known as: NORVASC  Take 10 mg by mouth in the morning.   aspirin  EC 81 MG tablet Take 1 tablet (81 mg total) by mouth 2 (two) times daily for 28 days. Swallow whole. What changed: when to take this   atorvastatin  20 MG tablet Commonly known as: LIPITOR Take 20 mg by mouth at bedtime.   BIOFREEZE EX Apply 1 Application topically daily as needed (pain).   cefadroxil  500 MG capsule Commonly known as: DURICEF Take 1 capsule (500 mg total) by mouth 2 (two) times daily for 7 days.   cyclobenzaprine  10 MG tablet Commonly known as: FLEXERIL  Take 1 tablet (10 mg total) by mouth every 12 (twelve) hours as needed for muscle spasms.   diclofenac  75 MG EC tablet Commonly known as: VOLTAREN  Take 1 tablet (75 mg total) by mouth 2 (two) times daily.   ferrous sulfate 325 (65 FE) MG tablet Take 1 tablet (325 mg total) by mouth daily with breakfast.   folic acid 1 MG tablet Commonly known as: FOLVITE Take 1 tablet (1 mg total) by mouth daily.   guaiFENesin 600 MG 12 hr tablet Commonly known as: MUCINEX Take 2 tablets (1,200 mg total) by mouth 2 (two) times daily.   magnesium  hydroxide 400 MG/5ML suspension Commonly known as: MILK OF MAGNESIA Take 15  mLs by mouth daily as needed (constipation.).   menthol -cetylpyridinium 3 MG lozenge Commonly known as: CEPACOL Take 1 lozenge (3 mg total) by mouth as needed for sore throat.   Metoprolol  Tartrate 75 MG Tabs Take 1 tablet (75 mg total) by mouth 2 (two) times daily. What changed:  medication strength how much to take   ondansetron  4 MG tablet Commonly known as: ZOFRAN  Take 1 tablet (4 mg total) by mouth every 6 (six) hours as needed for nausea.   oxyCODONE  5 MG immediate release tablet Commonly known as: Roxicodone  Take 1 tablet (5 mg total) by mouth every 4 (four) hours as needed for up to 7 days for severe pain (pain score 7-10) or moderate pain (pain score 4-6).   polyethylene glycol 17 g packet Commonly known as: MIRALAX  / GLYCOLAX  Take 17 g by mouth 2 (two) times daily.   senna 8.6 MG Tabs tablet Commonly known as: SENOKOT Take 1 tablet (8.6 mg total) by mouth 2 (two) times daily.               Discharge Care Instructions  (From admission, onward)           Start     Ordered   01/24/24 0000  Discharge wound care:       Comments: Leave on until you see orthopedic surgery   01/24/24 1317  Disposition and follow-up:   Jonathon Anderson was discharged from [facility] in Good condition.  At the hospital follow up visit please address:   1.   Post-op L Hip ORIF Ensure follow up with Dr. Edna  Acute on Chronic Anemia Scheduled for follow up CBC 7 days after d/c with Internal medicine residency team clinic Workup for acute on chronic anemia (last colonoscopy 2001) Ensure adequate folate and iron adherence  Iron may be contributing to constipation, talk about options HTN medication management Ensure adequate BP and HR control with new changes made to home regimen  Constipation Workup for constipation  Currently taking miralax   COPD/OSA workup  STOP-BANG score for OSA was high former smoker, episodes of SpO2 in  90s during admission   2.  Labs / imaging needed at time of follow-up: CBC, colonoscopy, PFTs, Blood Pressure   3.  Pending labs/ test needing follow-up: N/A  Follow-up Appointments: Patient is to follow up with Internal Medicine Residency clinic 7 days after d/c for repeat CBC.  Patient is the follow up with Dr. Edna in the clinic for post-op wound care and eval.    Hospital Course by problem list: Jonathon Anderson is a 68 y.o. person living with a history of HTN, HLD, TIA who presented with L hip dislocation and admitted for HTN and pain management. Stay was further complicated with acute on chronic anemia. He is now being discharged on hospital day 5 with the following pertinent hospital course:  Dislocation of internal hip prosthesis, left S/p open reduction with internal fixation Patient with recent L hip arthroplasty discharged from [facility] on 6/23. Patient returned to [facility] after continued leg pain. After attempting closed reduction, first in the ED and then in the OR, patient underwent open reduction and internal fixation on 6/30. On day of discharge, patient is POD3, wearing hip abduction orthosis, which should continue wearing for 6 weeks per orthopedics. He will continue with home health PT at discharge. Orthopedics has recommended 30 days of DVT prophylaxis with aspirin  81 mg BID. He will need orthopedics follow up for wound vac care.  Cefadroxil  (antibiotic) 1 capsule every 12 hours per Dr. Edna  Acute on chronic anemia Anemia of blood loss Iron deficiency anemia Folate deficiency  Work up during this admission revealed low reticulocyte index at 1.5, ferritin of 95, saturation rations of 11, folate of 5.4. Last colonoscopy was done on 2001; he will need GI referral for colonoscopy at hospital follow up. He was given 500mg  IV iron sucrose while inpatient and discharged on PO folate and iron supplementation. Will need continued use of bowel regimen. Hemoglobin at  discharge 7.1. We have scheduled a one-week lab only appointment at our internal medicine clinic to assess Hgb. Inpatient team will follow up  Leukocytosis Since admission, downtrending at discharg: WBC 20->14.6. Likely reactive. NO signs or symptoms of infection  Essential hypertension Tachycardia Above goal on admission. Re-introduced medications slowly. At discharged, continued on Amlodipine  10 mg daily and reduced metoprolol  tartrate 75 mg BID until he is evaluated by PCP at follow up.  Concern for COPD Current tobacco use  Patient with O2 saturations ~90-92, increased expiratory phase, and rhochi, which he reports is chronic. Given his tobacco use history, suspect this patient has COPD or obstructive disease. Recommend PFTs at follow and sleep study for OSA as his STOP BANG score is 5.  Constipation With opiates and iron supplement, patient has been constipated during this hospitalization. Received magnesium  citrate  on day of discharge. Please assess at follow up. He will discharged with Miralax  and Senna BID.    Stable chronic medical conditions: HTN, on metoprolol  75mg  BID and Amlodipine  10 mg daily  Hyperlipidemia - atorvastatin  20 mg  TIA    Discharge Exam:   BP 114/66 (BP Location: Right Arm)   Pulse 88   Temp 98.5 F (36.9 C) (Oral)   Resp 19   Ht 5' 7 (1.702 m)   Wt 101 kg   SpO2 93%   BMI 34.87 kg/m    Discharge exam:  Physical Exam Constitutional:      Appearance: Normal appearance.  Cardiovascular:     Rate and Rhythm: Normal rate and regular rhythm.  Pulmonary:     Effort: Pulmonary effort is normal.     Breath sounds: Rhonchi (clears with cough) present.  Musculoskeletal:        General: Normal range of motion.  Skin:    General: Skin is warm.  Neurological:     Mental Status: He is alert.  Psychiatric:        Mood and Affect: Mood normal.        Behavior: Behavior normal.       Pertinent Labs, Studies, and Procedures:     Latest Ref Rng &  Units 01/24/2024    7:55 AM 01/23/2024    5:39 AM 01/22/2024    6:15 AM  CBC  WBC 4.0 - 10.5 K/uL 13.8  14.6  20.7   Hemoglobin 13.0 - 17.0 g/dL 7.1  7.3  7.8   Hematocrit 39.0 - 52.0 % 22.6  22.9  24.7   Platelets 150 - 400 K/uL 415  388  445        Latest Ref Rng & Units 01/24/2024    7:55 AM 01/22/2024    6:15 AM 01/21/2024    4:57 PM  CMP  Glucose 70 - 99 mg/dL 891  862    BUN 8 - 23 mg/dL <5  5    Creatinine 9.38 - 1.24 mg/dL 9.24  9.35    Sodium 864 - 145 mmol/L 129  131  134   Potassium 3.5 - 5.1 mmol/L 4.0  4.4  3.7   Chloride 98 - 111 mmol/L 91  93    CO2 22 - 32 mmol/L 29  28    Calcium  8.9 - 10.3 mg/dL 7.7  8.0      DG HIP UNILAT WITH PELVIS 1V LEFT Result Date: 01/19/2024 CLINICAL DATA:  Elective surgery. Attempted closed reduction of left hip prosthesis dislocation. EXAM: DG HIP (WITH OR WITHOUT PELVIS) 1V*L* COMPARISON:  Radiograph yesterday FINDINGS: Two portable spot views of the left hip submitted. There is persistent dislocation of the femoral component of left hip arthroplasty. Stable positioning of the acetabular cup. IMPRESSION: Persistent dislocation of the femoral component of left hip arthroplasty. Electronically Signed   By: Andrea Gasman M.D.   On: 01/19/2024 10:09   DG HIP PORT UNILAT WITH PELVIS 1V LEFT Result Date: 01/18/2024 CLINICAL DATA:  Status post reduction of left hip dislocation EXAM: DG HIP (WITH OR WITHOUT PELVIS) 1V PORT LEFT COMPARISON:  Same day. FINDINGS: There remains superior dislocation of femoral component left hip arthroplasty. IMPRESSION: Continued superior dislocation of femoral component of left total hip arthroplasty. Electronically Signed   By: Lynwood Landy Raddle M.D.   On: 01/18/2024 16:07   DG Femur Min 2 Views Left Result Date: 01/18/2024 CLINICAL DATA:  Left hip pain. EXAM: LEFT FEMUR 2  VIEWS COMPARISON:  None Available. FINDINGS: Status post long-stem left total hip arthroplasty. Superior dislocation of left femoral component is noted.  No acute fracture is noted. IMPRESSION: Superior dislocation of left total hip arthroplasty. Electronically Signed   By: Lynwood Landy Raddle M.D.   On: 01/18/2024 14:37   DG Pelvis 1-2 Views Result Date: 01/18/2024 CLINICAL DATA:  Left hip pain. EXAM: PELVIS - 1-2 VIEW COMPARISON:  January 09, 2024. FINDINGS: Status post long-stem left total hip arthroplasty. There is superior dislocation of left femoral prosthesis. IMPRESSION: Superior dislocation of left total hip arthroplasty. Electronically Signed   By: Lynwood Landy Raddle M.D.   On: 01/18/2024 14:35     Discharge Instructions: Discharge Instructions     Call MD for:  difficulty breathing, headache or visual disturbances   Complete by: As directed    Call MD for:  extreme fatigue   Complete by: As directed    Call MD for:  hives   Complete by: As directed    Call MD for:  persistant dizziness or light-headedness   Complete by: As directed    Call MD for:  persistant nausea and vomiting   Complete by: As directed    Call MD for:  redness, tenderness, or signs of infection (pain, swelling, redness, odor or green/yellow discharge around incision site)   Complete by: As directed    Call MD for:  severe uncontrolled pain   Complete by: As directed    Call MD for:  temperature >100.4   Complete by: As directed    Diet - low sodium heart healthy   Complete by: As directed    Discharge instructions   Complete by: As directed    Thank you for allowing us  to be part of your care. You were hospitalized for 5 days.   We treated you for pain management of L Hip from dislocation and post operative care with medications acetaminophen , hydromorphone , and oxycodone .   We also treated you for acute on chronic anemia with IV/oral iron and IV/oral Folate.  See the changes in your medications and management of your chronic conditions below:  *For your Dislocation of Internal Hip prothesis/Post Operative open reduction and internal fixation.  -We have  STARTED you on these following medications:  -Acetaminophen  1,000 every 6 hours  -Oxycodone  10 mg tablet AS NEEDED every 8 hours              -Cefadroxil  (antibiotic) 1 capsule every 12 hours per Dr. Edna   -Miralax  packet 2 times daily until bowel movement             *For your Anemia  -We have STARTED you on these following medications:  -ferrous sulfate tablet 325 mg daily with breakfast   -folic acid 1 mg daily              - We have also scheduled follow up in our clinic 1 week from today. Please show up during open clinic hours starting at 8:30 to have your CBC (blood labs) checked.   *For your Hypertension and Tachycardia  -We have CONTINUED your home medication of             -Amlodipine  10 mg daily   -STOP taking the following medication until you speak with your primary care doctor            -Metoprolol  100 mg twice daily   -We have CHANGED the following medications to:              -  Metoprolol . Take 75 mg every 12 hours.   *For your Cough -Continue the Mucinex until 02/01/2024 -Follow up with your Primary care doctor   Please see [facility] in 7 days for Complete Blood Count monitoring. The Clinic address is: [address], [place], [place] [address]   FOLLOW UP APPOINTMENTS: Please follow up with your primary care provider.   Due to the acute on chronic anemia and your last colonoscopy being >20 years ago, we also encourage you to follow up with your GI doctor.   Please call your PCP or our clinic if you have any questions or concerns, we may be able to help and keep you from a long and expensive emergency room wait. Our clinic and after hours phone number is [phone]. The best time to call is Monday through Friday 9 am to 4 pm but there is always someone available 24/7 if you have an emergency. If you need medication refills please notify your pharmacy one week in advance and they will send us  a request.   We are glad you  are feeling better,  Sallyanne Primas Internal Medicine Inpatient Teaching Service at [facility]   Discharge wound care:   Complete by: As directed    Leave on until you see orthopedic surgery   Increase activity slowly   Complete by: As directed        Signed: Danyeal Akens, DO 01/24/2024, 1:26 PM

## 2024-01-24 NOTE — Hospital Course (Addendum)
 Dislocation of internal hip prosthesis, left S/p open reduction with internal fixation Patient with recent L hip arthroplasty discharged from [facility] on 6/23. Patient returned to [facility] after continued leg pain. After attempting closed reduction, first in the ED and then in the OR, patient underwent open reduction and internal fixation on 6/30. On day of discharge, patient is POD3, wearing hip abduction orthosis, which should continue wearing for 6 weeks per orthopedics. He will continue with home health PT at discharge. Orthopedics has recommended 30 days of DVT prophylaxis with aspirin  81 mg BID. He will need orthopedics follow up for wound vac care.   Acute on chronic anemia Anemia of blood loss Iron deficiency anemia Folate deficiency  Work up during this admission revealed low reticulocyte index at 1.5, ferritin of 95, saturation rations of 11, folate of 5.4. Last colonoscopy was done on 2001; he will need GI referral for colonoscopy at hospital follow up. He was discharged on PO folate and iron supplementation. Will need continued use of bowel regimen. Hemoglobin at discharge 7.1. We have scheduled a one-week lab only appointment at our internal medicine clinic to assess Hgb. Inpatient team will follow up  Leukocytosis Since admission, downtrending at discharg: WBC 20->14.6. Likely reactive. NO signs or symptoms of infection  Essential hypertension Tachycardia Above goal on admission. Re-introduced medications slowly. At discharged, continued on Amlodipine  10 mg daily and reduced metoprolol  tartrate 75 mg BID until he is evaluated by PCP at follow up.  Concern for COPD Current tobacco use  Patient with O2 saturations ~90-92, increased expiratory phase, and rhochi, which he reports is chronic. Given his tobacco use history, suspect this patient has COPD or obstructive disease. Recommend PFTs at follow and sleep study for OSA as his STOP BANG score is 5.  Constipation With opiates and iron  supplement, patient has been constipated during this hospitalization. Received magnesium  citrate on day of discharge. Please assess at follow up. He will discharged with Miralax  and Senna BID.

## 2024-01-24 NOTE — Progress Notes (Signed)
 Spoke with patient about f/u in clinic one week from now to draw CBC for Hbg count. Patient requested to call sister Jonathon Anderson to let her know about his appointment. Sister knows that he will need to show up between clinic hours anytime after 8:30am one week from today and I specified I will write clinic information in d/c instructions.

## 2024-01-24 NOTE — Plan of Care (Signed)
  Problem: Education: Goal: Knowledge of General Education information will improve Description: Including pain rating scale, medication(s)/side effects and non-pharmacologic comfort measures Outcome: Progressing   Problem: Health Behavior/Discharge Planning: Goal: Ability to manage health-related needs will improve Outcome: Progressing   Problem: Clinical Measurements: Goal: Ability to maintain clinical measurements within normal limits will improve Outcome: Progressing Goal: Will remain free from infection Outcome: Progressing Goal: Diagnostic test results will improve Outcome: Progressing Goal: Respiratory complications will improve Outcome: Progressing Goal: Cardiovascular complication will be avoided Outcome: Progressing   Problem: Nutrition: Goal: Adequate nutrition will be maintained Outcome: Progressing   Problem: Elimination: Goal: Will not experience complications related to bowel motility Outcome: Progressing Goal: Will not experience complications related to urinary retention Outcome: Progressing   Problem: Education: Goal: Verbalization of understanding the information provided (i.e., activity precautions, restrictions, etc) will improve Outcome: Progressing Goal: Individualized Educational Video(s) Outcome: Progressing   Problem: Activity: Goal: Ability to ambulate and perform ADLs will improve Outcome: Progressing   Problem: Pain Management: Goal: Pain level will decrease Outcome: Progressing   Problem: Self-Concept: Goal: Ability to maintain and perform role responsibilities to the fullest extent possible will improve Outcome: Progressing

## 2024-01-25 DIAGNOSIS — Z7982 Long term (current) use of aspirin: Secondary | ICD-10-CM | POA: Diagnosis not present

## 2024-01-25 DIAGNOSIS — R000 Tachycardia, unspecified: Secondary | ICD-10-CM | POA: Diagnosis not present

## 2024-01-25 DIAGNOSIS — Z79899 Other long term (current) drug therapy: Secondary | ICD-10-CM | POA: Diagnosis not present

## 2024-01-25 DIAGNOSIS — Z791 Long term (current) use of non-steroidal anti-inflammatories (NSAID): Secondary | ICD-10-CM | POA: Diagnosis not present

## 2024-01-25 DIAGNOSIS — S72142K Displaced intertrochanteric fracture of left femur, subsequent encounter for closed fracture with nonunion: Secondary | ICD-10-CM | POA: Diagnosis not present

## 2024-01-25 DIAGNOSIS — K5900 Constipation, unspecified: Secondary | ICD-10-CM | POA: Diagnosis not present

## 2024-01-25 DIAGNOSIS — E785 Hyperlipidemia, unspecified: Secondary | ICD-10-CM | POA: Diagnosis not present

## 2024-01-25 DIAGNOSIS — I10 Essential (primary) hypertension: Secondary | ICD-10-CM | POA: Diagnosis not present

## 2024-01-25 DIAGNOSIS — Z8673 Personal history of transient ischemic attack (TIA), and cerebral infarction without residual deficits: Secondary | ICD-10-CM | POA: Diagnosis not present

## 2024-01-25 DIAGNOSIS — G4733 Obstructive sleep apnea (adult) (pediatric): Secondary | ICD-10-CM | POA: Diagnosis not present

## 2024-01-25 DIAGNOSIS — Z87891 Personal history of nicotine dependence: Secondary | ICD-10-CM | POA: Diagnosis not present

## 2024-01-25 DIAGNOSIS — E538 Deficiency of other specified B group vitamins: Secondary | ICD-10-CM | POA: Diagnosis not present

## 2024-01-25 DIAGNOSIS — Z9181 History of falling: Secondary | ICD-10-CM | POA: Diagnosis not present

## 2024-01-25 DIAGNOSIS — D72829 Elevated white blood cell count, unspecified: Secondary | ICD-10-CM | POA: Diagnosis not present

## 2024-01-25 DIAGNOSIS — D500 Iron deficiency anemia secondary to blood loss (chronic): Secondary | ICD-10-CM | POA: Diagnosis not present

## 2024-01-25 DIAGNOSIS — T84021A Dislocation of internal left hip prosthesis, initial encounter: Secondary | ICD-10-CM | POA: Diagnosis not present

## 2024-01-25 LAB — BPAM RBC
Blood Product Expiration Date: 202508042359
Blood Product Expiration Date: 202508052359
ISSUE DATE / TIME: 202506301457
ISSUE DATE / TIME: 202506301457
Unit Type and Rh: 5100
Unit Type and Rh: 5100

## 2024-01-25 LAB — TYPE AND SCREEN
ABO/RH(D): O POS
Antibody Screen: NEGATIVE
Unit division: 0
Unit division: 0

## 2024-01-29 DIAGNOSIS — K5900 Constipation, unspecified: Secondary | ICD-10-CM | POA: Diagnosis not present

## 2024-01-29 DIAGNOSIS — E785 Hyperlipidemia, unspecified: Secondary | ICD-10-CM | POA: Diagnosis not present

## 2024-01-29 DIAGNOSIS — S73015A Posterior dislocation of left hip, initial encounter: Secondary | ICD-10-CM | POA: Diagnosis not present

## 2024-01-29 DIAGNOSIS — I10 Essential (primary) hypertension: Secondary | ICD-10-CM | POA: Diagnosis not present

## 2024-01-29 DIAGNOSIS — D500 Iron deficiency anemia secondary to blood loss (chronic): Secondary | ICD-10-CM | POA: Diagnosis not present

## 2024-01-29 DIAGNOSIS — T84021A Dislocation of internal left hip prosthesis, initial encounter: Secondary | ICD-10-CM | POA: Diagnosis not present

## 2024-01-29 DIAGNOSIS — G4733 Obstructive sleep apnea (adult) (pediatric): Secondary | ICD-10-CM | POA: Diagnosis not present

## 2024-01-31 DIAGNOSIS — K5900 Constipation, unspecified: Secondary | ICD-10-CM | POA: Diagnosis not present

## 2024-01-31 DIAGNOSIS — D500 Iron deficiency anemia secondary to blood loss (chronic): Secondary | ICD-10-CM | POA: Diagnosis not present

## 2024-01-31 DIAGNOSIS — G4733 Obstructive sleep apnea (adult) (pediatric): Secondary | ICD-10-CM | POA: Diagnosis not present

## 2024-01-31 DIAGNOSIS — I10 Essential (primary) hypertension: Secondary | ICD-10-CM | POA: Diagnosis not present

## 2024-01-31 DIAGNOSIS — T84021A Dislocation of internal left hip prosthesis, initial encounter: Secondary | ICD-10-CM | POA: Diagnosis not present

## 2024-01-31 DIAGNOSIS — E785 Hyperlipidemia, unspecified: Secondary | ICD-10-CM | POA: Diagnosis not present

## 2024-02-01 DIAGNOSIS — S73015D Posterior dislocation of left hip, subsequent encounter: Secondary | ICD-10-CM | POA: Diagnosis not present

## 2024-02-04 DIAGNOSIS — G4733 Obstructive sleep apnea (adult) (pediatric): Secondary | ICD-10-CM | POA: Diagnosis not present

## 2024-02-04 DIAGNOSIS — K5900 Constipation, unspecified: Secondary | ICD-10-CM | POA: Diagnosis not present

## 2024-02-04 DIAGNOSIS — E785 Hyperlipidemia, unspecified: Secondary | ICD-10-CM | POA: Diagnosis not present

## 2024-02-04 DIAGNOSIS — T84021A Dislocation of internal left hip prosthesis, initial encounter: Secondary | ICD-10-CM | POA: Diagnosis not present

## 2024-02-04 DIAGNOSIS — I10 Essential (primary) hypertension: Secondary | ICD-10-CM | POA: Diagnosis not present

## 2024-02-04 DIAGNOSIS — D500 Iron deficiency anemia secondary to blood loss (chronic): Secondary | ICD-10-CM | POA: Diagnosis not present

## 2024-02-05 DIAGNOSIS — T84021A Dislocation of internal left hip prosthesis, initial encounter: Secondary | ICD-10-CM | POA: Diagnosis not present

## 2024-02-05 DIAGNOSIS — G4733 Obstructive sleep apnea (adult) (pediatric): Secondary | ICD-10-CM | POA: Diagnosis not present

## 2024-02-05 DIAGNOSIS — I10 Essential (primary) hypertension: Secondary | ICD-10-CM | POA: Diagnosis not present

## 2024-02-05 DIAGNOSIS — D500 Iron deficiency anemia secondary to blood loss (chronic): Secondary | ICD-10-CM | POA: Diagnosis not present

## 2024-02-05 DIAGNOSIS — E785 Hyperlipidemia, unspecified: Secondary | ICD-10-CM | POA: Diagnosis not present

## 2024-02-05 DIAGNOSIS — K5900 Constipation, unspecified: Secondary | ICD-10-CM | POA: Diagnosis not present

## 2024-02-07 DIAGNOSIS — G4733 Obstructive sleep apnea (adult) (pediatric): Secondary | ICD-10-CM | POA: Diagnosis not present

## 2024-02-07 DIAGNOSIS — I10 Essential (primary) hypertension: Secondary | ICD-10-CM | POA: Diagnosis not present

## 2024-02-07 DIAGNOSIS — D500 Iron deficiency anemia secondary to blood loss (chronic): Secondary | ICD-10-CM | POA: Diagnosis not present

## 2024-02-07 DIAGNOSIS — K5900 Constipation, unspecified: Secondary | ICD-10-CM | POA: Diagnosis not present

## 2024-02-07 DIAGNOSIS — T84021A Dislocation of internal left hip prosthesis, initial encounter: Secondary | ICD-10-CM | POA: Diagnosis not present

## 2024-02-07 DIAGNOSIS — E785 Hyperlipidemia, unspecified: Secondary | ICD-10-CM | POA: Diagnosis not present

## 2024-02-12 DIAGNOSIS — S73015D Posterior dislocation of left hip, subsequent encounter: Secondary | ICD-10-CM | POA: Diagnosis not present

## 2024-02-13 DIAGNOSIS — T84021A Dislocation of internal left hip prosthesis, initial encounter: Secondary | ICD-10-CM | POA: Diagnosis not present

## 2024-02-13 DIAGNOSIS — I10 Essential (primary) hypertension: Secondary | ICD-10-CM | POA: Diagnosis not present

## 2024-02-13 DIAGNOSIS — D500 Iron deficiency anemia secondary to blood loss (chronic): Secondary | ICD-10-CM | POA: Diagnosis not present

## 2024-02-13 DIAGNOSIS — G4733 Obstructive sleep apnea (adult) (pediatric): Secondary | ICD-10-CM | POA: Diagnosis not present

## 2024-02-13 DIAGNOSIS — K5900 Constipation, unspecified: Secondary | ICD-10-CM | POA: Diagnosis not present

## 2024-02-13 DIAGNOSIS — E785 Hyperlipidemia, unspecified: Secondary | ICD-10-CM | POA: Diagnosis not present

## 2024-02-13 NOTE — Progress Notes (Signed)
 Anesthesia Review:  PCP: Cardiologist :  PPM/ ICD: Device Orders: Rep Notified:  Chest x-ray : EKG : 01/18/24  Echo : Stress test: Cardiac Cath :   Activity level:  Sleep Study/ CPAP : Fasting Blood Sugar :      / Checks Blood Sugar -- times a day:    Blood Thinner/ Instructions /Last Dose: ASA / Instructions/ Last Dose :    81 mg aspirin     01/18/24- dislocation of hip prosthesis 01/09/24- conversion to left totoal hip

## 2024-02-13 NOTE — Patient Instructions (Signed)
 SURGICAL WAITING ROOM VISITATION  Patients having surgery or a procedure may have no more than 2 support people in the waiting area - these visitors may rotate.    Children under the age of 1 must have an adult with them who is not the patient.  Visitors with respiratory illnesses are discouraged from visiting and should remain at home.  If the patient needs to stay at the hospital during part of their recovery, the visitor guidelines for inpatient rooms apply. Pre-op nurse will coordinate an appropriate time for 1 support person to accompany patient in pre-op.  This support person may not rotate.    Please refer to the [facility] website for the visitor guidelines for Inpatients (after your surgery is over and you are in a regular room).       Your procedure is scheduled on:  02/18/24    Report to [facility] Main Entrance    Report to admitting at  0930 AM   Call this number if you have problems the morning of surgery [phone]   Do not eat food :After Midnight.   After Midnight you may have the following liquids until ___ 0900___ AM  DAY OF SURGERY  Water  Non-Citrus Juices (without pulp, NO RED-Apple, White grape, White cranberry) Black Coffee (NO MILK/CREAM OR CREAMERS, sugar ok)  Clear Tea (NO MILK/CREAM OR CREAMERS, sugar ok) regular and decaf                             Plain Jell-O (NO RED)                                           Fruit ices (not with fruit pulp, NO RED)                                     Popsicles (NO RED)                                                               Sports drinks like Gatorade (             If you have questions, please contact your surgeon's office.       Oral Hygiene is also important to reduce your risk of infection.                                    Remember - BRUSH YOUR TEETH THE MORNING OF SURGERY WITH YOUR REGULAR TOOTHPASTE  DENTURES WILL BE REMOVED PRIOR TO SURGERY PLEASE DO NOT APPLY Poly grip OR  ADHESIVES!!!   Do NOT smoke after Midnight   Stop all vitamins and herbal supplements 7 days before surgery.   Take these medicines the morning of surgery with A SIP OF WATER :  amlodipine  , metoprolol    DO NOT TAKE ANY ORAL DIABETIC MEDICATIONS DAY OF YOUR SURGERY  Bring CPAP mask and tubing day of surgery.  You may not have any metal on your body including hair pins, jewelry, and body piercing             Do not wear make-up, lotions, powders, perfumes/cologne, or deodorant  Do not wear nail polish including gel and S&S, artificial/acrylic nails, or any other type of covering on natural nails including finger and toenails. If you have artificial nails, gel coating, etc. that needs to be removed by a nail salon please have this removed prior to surgery or surgery may need to be canceled/ delayed if the surgeon/ anesthesia feels like they are unable to be safely monitored.   Do not shave  48 hours prior to surgery.               Men may shave face and neck.   Do not bring valuables to the hospital. [facility] IS NOT             RESPONSIBLE   FOR VALUABLES.   Contacts, glasses, dentures or bridgework may not be worn into surgery.   Bring small overnight bag day of surgery.   DO NOT BRING YOUR HOME MEDICATIONS TO THE HOSPITAL. PHARMACY WILL DISPENSE MEDICATIONS LISTED ON YOUR MEDICATION LIST TO YOU DURING YOUR ADMISSION IN THE HOSPITAL!    Patients discharged on the day of surgery will not be allowed to drive home.  Someone NEEDS to stay with you for the first 24 hours after anesthesia.   Special Instructions: Bring a copy of your healthcare power of attorney and living will documents the day of surgery if you haven't scanned them before.              Please read over the following fact sheets you were given: IF YOU HAVE QUESTIONS ABOUT YOUR PRE-OP INSTRUCTIONS PLEASE CALL [phone].   If you received a COVID test during your pre-op visit  it is  requested that you wear a mask when out in public, stay away from anyone that may not be feeling well and notify your surgeon if you develop symptoms. If you test positive for Covid or have been in contact with anyone that has tested positive in the last 10 days please notify you surgeon.    [place] - Preparing for Surgery Before surgery, you can play an important role.  Because skin is not sterile, your skin needs to be as free of germs as possible.  You can reduce the number of germs on your skin by washing with CHG (chlorahexidine gluconate) soap before surgery.  CHG is an antiseptic cleaner which kills germs and bonds with the skin to continue killing germs even after washing. Please DO NOT use if you have an allergy to CHG or antibacterial soaps.  If your skin becomes reddened/irritated stop using the CHG and inform your nurse when you arrive at Short Stay. Do not shave (including legs and underarms) for at least 48 hours prior to the first CHG shower.  You may shave your face/neck. Please follow these instructions carefully:  1.  Shower with CHG Soap the night before surgery and the  morning of Surgery.  2.  If you choose to wash your hair, wash your hair first as usual with your  normal  shampoo.  3.  After you shampoo, rinse your hair and body thoroughly to remove the  shampoo.  4.  Use CHG as you would any other liquid soap.  You can apply chg directly  to the skin and wash                       Gently with a scrungie or clean washcloth.  5.  Apply the CHG Soap to your body ONLY FROM THE NECK DOWN.   Do not use on face/ open                           Wound or open sores. Avoid contact with eyes, ears mouth and genitals (private parts).                       Wash face,  Genitals (private parts) with your normal soap.             6.  Wash thoroughly, paying special attention to the area where your surgery  will be performed.  7.  Thoroughly rinse your body with warm  water  from the neck down.  8.  DO NOT shower/wash with your normal soap after using and rinsing off  the CHG Soap.                9.  Pat yourself dry with a clean towel.            10.  Wear clean pajamas.            11.  Place clean sheets on your bed the night of your first shower and do not  sleep with pets. Day of Surgery : Do not apply any lotions/deodorants the morning of surgery.  Please wear clean clothes to the hospital/surgery center.  FAILURE TO FOLLOW THESE INSTRUCTIONS MAY RESULT IN THE CANCELLATION OF YOUR SURGERY PATIENT SIGNATURE_________________________________  NURSE SIGNATURE__________________________________  ________________________________________________________________________

## 2024-02-14 ENCOUNTER — Encounter (HOSPITAL_COMMUNITY): Payer: Self-pay

## 2024-02-14 ENCOUNTER — Other Ambulatory Visit: Payer: Self-pay

## 2024-02-14 ENCOUNTER — Encounter (HOSPITAL_COMMUNITY)
Admission: RE | Admit: 2024-02-14 | Discharge: 2024-02-14 | Disposition: A | Discharge status: Home / Self Care | Source: Ambulatory Visit | Attending: Orthopedic Surgery | Admitting: Orthopedic Surgery

## 2024-02-14 VITALS — Ht 67.0 in | Wt 222.7 lb

## 2024-02-14 DIAGNOSIS — Z01818 Encounter for other preprocedural examination: Secondary | ICD-10-CM

## 2024-02-15 ENCOUNTER — Ambulatory Visit: Payer: Self-pay | Admitting: Emergency Medicine

## 2024-02-15 DIAGNOSIS — T84021A Dislocation of internal left hip prosthesis, initial encounter: Secondary | ICD-10-CM | POA: Diagnosis not present

## 2024-02-15 DIAGNOSIS — I10 Essential (primary) hypertension: Secondary | ICD-10-CM | POA: Diagnosis not present

## 2024-02-15 DIAGNOSIS — G4733 Obstructive sleep apnea (adult) (pediatric): Secondary | ICD-10-CM | POA: Diagnosis not present

## 2024-02-15 DIAGNOSIS — T8189XA Other complications of procedures, not elsewhere classified, initial encounter: Secondary | ICD-10-CM

## 2024-02-15 DIAGNOSIS — D500 Iron deficiency anemia secondary to blood loss (chronic): Secondary | ICD-10-CM | POA: Diagnosis not present

## 2024-02-15 DIAGNOSIS — E785 Hyperlipidemia, unspecified: Secondary | ICD-10-CM | POA: Diagnosis not present

## 2024-02-15 DIAGNOSIS — K5900 Constipation, unspecified: Secondary | ICD-10-CM | POA: Diagnosis not present

## 2024-02-15 NOTE — H&P (View-Only) (Signed)
 INCISION AND DRAINAGE ADMISSION H&P  Patient is admitted for left hip incision and drainage.  Subjective:  Chief Complaint: left hip pain with delayed wound healing  HPI: Jonathon Anderson, 68 y.o. male, has a history of recent revision left total hip arthroplasty on 01/09/2024 with subsequent revision on 01/21/24 following dislocation by hardware removal, proximal femur replacement, and soft tissue advancement.  His incision from the revisions has continued to demonstrate poor wound healing, with chronic clear golden drainage.  No purulence noted by patient.  Patient currently rates pain in the left hip at 7 out of 10 with activity. Patient has night pain, worsening of pain with activity and weight bearing, trendelenberg gait, pain that interfers with activities of daily living, and pain with passive range of motion. Patient has evidence of hardware in good position without complications most recently on imaging.  No new injuries.  No new numbness or tingling.  No fevers, chills, or erythematous appearance of the hip joint.  This condition presents safety issues increasing the risk of falls.    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 01/09/2024   Closed fracture of proximal end of left femur with nonunion 07/27/2022   Atypical fracture of femur with nonunion 06/06/2022   Closed disp comminuted fracture of shaft of left femur with nonunion 06/06/2022   Closed subtrochanteric fracture of hip, left, initial encounter (HCC) 05/10/2021   Hypertension 05/10/2021   Mixed hyperlipidemia 05/10/2021   Nicotine dependence 05/10/2021   Hyponatremia 05/10/2021   Past Medical History:  Diagnosis Date   Arthritis     Hyperlipidemia    Hypertension    Nicotine dependence    Stroke (HCC)    LIGHT STROKE - NUMBNESS AND TINGLING IN LEFT HAND - 10/2008 APPROX    Past Surgical History:  Procedure Laterality Date   APPLICATION OF WOUND VAC Left 01/09/2024   Procedure: APPLICATION, WOUND VAC;  Surgeon: Edna Toribio LABOR, MD;  Location: [facility] ORS;  Service: Orthopedics;  Laterality: Left;   CONVERSION TO TOTAL HIP Left 01/09/2024   Procedure: CONVERSION TO TOTAL HIP;  Surgeon: Edna Toribio LABOR, MD;  Location: [facility] ORS;  Service: Orthopedics;  Laterality: Left;   FEMUR IM NAIL Left 07/27/2022   Procedure: REPAIR FEMUR WITH AUTOGRAFT/ RIA;  Surgeon: Celena Sharper, MD;  Location: [facility] OR;  Service: Orthopedics;  Laterality: Left;   HARDWARE REMOVAL Left 06/06/2022   Procedure: HARDWARE REMOVAL;  Surgeon: Celena Sharper, MD;  Location: [facility] OR;  Service: Orthopedics;  Laterality: Left;   HARDWARE REMOVAL Bilateral 07/27/2022   Procedure: REPAIR OF LEFT PROXIMAL FEMUR NONUNION, REMOVAL OF ANTIBIOTIC SPACER, REAMED INTERMEDULLARY ASPRIATE RIGHT FEMUR;  Surgeon: Celena Sharper, MD;  Location: [facility] OR;  Service: Orthopedics;  Laterality: Bilateral;   HIP CLOSED REDUCTION Left 01/19/2024   Procedure: Attempted CLOSED REDUCTION, HIP;  Surgeon: Cristy Bonner DASEN, MD;  Location[facility] OR;  Service: Orthopedics;  Laterality: Left;   INTRAMEDULLARY (IM) NAIL INTERTROCHANTERIC Left 05/11/2021   Procedure: INTRAMEDULLARY (IM) NAIL INTERTROCHANTRIC OF LEFT SUBTHROCHANTRIC HIP FRACTURE;  Surgeon: Edie Norleen PARAS, MD;  Location: [facility] ORS;  Service: Orthopedics;  Laterality: Left;   ORIF FEMUR FRACTURE Left 06/06/2022   Procedure: OPEN REDUCTION INTERNAL FIXATION FEMORAL PROXIMAL FRACTURE;  Surgeon: Celena Sharper,  MD;  Location: [facility] OR;  Service: Orthopedics;  Laterality: Left;   TOTAL HIP REVISION Left 01/21/2024   Procedure: Open Hip Reduction with Local Tissue Advancement;  Surgeon: Edna Toribio LABOR, MD;  Location: [facility] OR;  Service: Orthopedics;   Laterality: Left;    Current Outpatient Medications  Medication Sig Dispense Refill Last Dose/Taking   acetaminophen  (TYLENOL ) 500 MG tablet Take 1,000 mg by mouth in the morning and at bedtime.      amLODipine  (NORVASC ) 10 MG tablet Take 10 mg by mouth in the morning.      aspirin  EC 81 MG tablet Take 1 tablet (81 mg total) by mouth 2 (two) times daily for 28 days. Swallow whole.      atorvastatin  (LIPITOR) 20 MG tablet Take 20 mg by mouth at bedtime.      cyclobenzaprine  (FLEXERIL ) 10 MG tablet Take 1 tablet (10 mg total) by mouth every 12 (twelve) hours as needed for muscle spasms. 40 tablet 0    diclofenac  (VOLTAREN ) 75 MG EC tablet Take 1 tablet (75 mg total) by mouth 2 (two) times daily. 60 tablet 0    ferrous sulfate  325 (65 FE) MG tablet Take 1 tablet (325 mg total) by mouth daily with breakfast. (Patient not taking: Reported on 02/13/2024) 30 tablet 0    folic acid  (FOLVITE ) 1 MG tablet Take 1 tablet (1 mg total) by mouth daily. (Patient not taking: Reported on 02/13/2024) 30 tablet 0    guaiFENesin  (MUCINEX ) 600 MG 12 hr tablet Take 2 tablets (1,200 mg total) by mouth 2 (two) times daily. (Patient not taking: Reported on 02/13/2024) 120 tablet 0    magnesium  hydroxide (MILK OF MAGNESIA) 400 MG/5ML suspension Take 15 mLs by mouth daily as needed (constipation.).      Menthol , Topical Analgesic, (BIOFREEZE EX) Apply 1 Application topically daily as needed (pain).      menthol -cetylpyridinium (CEPACOL) 3 MG lozenge Take 1 lozenge (3 mg total) by mouth as needed for sore throat. (Patient not taking: Reported on 02/13/2024) 100 tablet 12    metoprolol  tartrate (LOPRESSOR ) 100 MG tablet Take 100 mg by mouth 2 (two) times daily.      Metoprolol  Tartrate 75 MG TABS Take 1 tablet (75 mg total) by mouth 2 (two) times daily. (Patient not taking: Reported on 02/13/2024) 30 tablet 0    ondansetron  (ZOFRAN ) 4 MG tablet Take 1 tablet (4 mg total) by mouth every 6 (six) hours as needed for nausea. 20 tablet 0     polyethylene glycol (MIRALAX  / GLYCOLAX ) 17 g packet Take 17 g by mouth 2 (two) times daily. (Patient not taking: Reported on 02/13/2024) 14 each 0    senna (SENOKOT) 8.6 MG TABS tablet Take 1 tablet (8.6 mg total) by mouth 2 (two) times daily. (Patient not taking: Reported on 02/13/2024) 120 tablet 0    No current facility-administered medications for this visit.   No Known Allergies  Social History   Tobacco Use   Smoking status: Former    Current packs/day: 0.00    Types: Cigarettes    Quit date: 06/01/2022    Years since quitting: 1.7   Smokeless tobacco: Never  Substance Use Topics   Alcohol  use: Yes    Comment: occ beer    Family History  Problem Relation Age of Onset   Heart attack Mother    Hypertension Brother    Heart failure Paternal Grandfather      Review of Systems  Musculoskeletal:  Positive for arthralgias.  Skin:  Positive for wound.  All other systems reviewed and are negative.   Objective:  Physical Exam Constitutional:      General: He is not in acute distress.    Appearance: Normal appearance. He is normal weight.  HENT:     Head: Normocephalic and atraumatic.  Eyes:     Extraocular Movements: Extraocular movements intact.     Conjunctiva/sclera: Conjunctivae normal.     Pupils: Pupils are equal, round, and reactive to light.  Cardiovascular:     Rate and Rhythm: Normal rate and regular rhythm.     Pulses: Normal pulses.     Heart sounds: Normal heart sounds.  Pulmonary:     Effort: Pulmonary effort is normal. No respiratory distress.     Breath sounds: Normal breath sounds.  Abdominal:     General: Bowel sounds are normal. There is no distension.     Palpations: Abdomen is soft.     Tenderness: There is no abdominal tenderness.  Musculoskeletal:        General: Tenderness present.     Cervical back: Normal range of motion and neck supple.     Comments: Tolerates gentle left hip ROM.  BLE appear grossly neurovascularly intact.   Dorsiflexion and plantar flexion intact.  Lymphadenopathy:     Cervical: No cervical adenopathy.  Skin:    General: Skin is warm and dry.     Capillary Refill: Capillary refill takes less than 2 seconds.     Findings: No erythema or rash.     Comments: Incisional wound of left hip with delayed healing, continuous clear-golden drainage.  No significant blood or erythema.  Notable post-operative swelling of left hip, tender to touch.  BLE otherwise with skin intact.  Neurological:     General: No focal deficit present.     Mental Status: He is alert and oriented to person, place, and time.  Psychiatric:        Mood and Affect: Mood normal.        Behavior: Behavior normal.     Vital signs in last 24 hours: @VSRANGES @  Labs:   Estimated body mass index is 34.87 kg/m as calculated from the following:   Height as of 02/14/24: 5' 7 (1.702 m).   Weight as of 02/14/24: 101 kg.   Imaging Review Plain radiographs demonstrate revisional left total hip arthroplasty components in good position without adverse features. The bone quality appears to be poor for age and reported activity level.      Assessment/Plan:  Delayed wound healing of surgical incision, left hip  The patient history, physical examination, clinical judgement of the provider and imaging studies are consistent with delayed wound healing of the hip(s) following recent revision and subsequent revision of left hip components.  Incision and drainage with likely wound vac application is deemed medically necessary. The various treatment options were discussed at length. The risks and benefits of incision and drainage were presented and reviewed. The risks due to aseptic loosening, infection, stiffness, dislocation/subluxation, thromboembolic complications and other imponderables were discussed.  The patient acknowledged the explanation, agreed to proceed with the plan and consent was signed. Patient is being admitted for  inpatient treatment for surgery, pain control, PT, OT, prophylactic antibiotics, VTE prophylaxis, progressive ambulation and ADL's and discharge planning.The patient is planning to be discharged likely with HHPT ([facility]).

## 2024-02-15 NOTE — H&P (Addendum)
 INCISION AND DRAINAGE ADMISSION H&P  Patient is admitted for left hip incision and drainage.  Subjective:  Chief Complaint: left hip pain with delayed wound healing  HPI: Jonathon Anderson, 68 y.o. male, has a history of recent revision left total hip arthroplasty on 01/09/2024 with subsequent revision on 01/21/24 following dislocation by hardware removal, proximal femur replacement, and soft tissue advancement.  His incision from the revisions has continued to demonstrate poor wound healing, with chronic clear golden drainage.  No purulence noted by patient.  Patient currently rates pain in the left hip at 7 out of 10 with activity. Patient has night pain, worsening of pain with activity and weight bearing, trendelenberg gait, pain that interfers with activities of daily living, and pain with passive range of motion. Patient has evidence of hardware in good position without complications most recently on imaging.  No new injuries.  No new numbness or tingling.  No fevers, chills, or erythematous appearance of the hip joint.  This condition presents safety issues increasing the risk of falls.    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 01/09/2024   Closed fracture of proximal end of left femur with nonunion 07/27/2022   Atypical fracture of femur with nonunion 06/06/2022   Closed disp comminuted fracture of shaft of left femur with nonunion 06/06/2022   Closed subtrochanteric fracture of hip, left, initial encounter (HCC) 05/10/2021   Hypertension 05/10/2021   Mixed hyperlipidemia 05/10/2021   Nicotine dependence 05/10/2021   Hyponatremia 05/10/2021   Past Medical History:  Diagnosis Date   Arthritis     Hyperlipidemia    Hypertension    Nicotine dependence    Stroke (HCC)    LIGHT STROKE - NUMBNESS AND TINGLING IN LEFT HAND - 10/2008 APPROX    Past Surgical History:  Procedure Laterality Date   APPLICATION OF WOUND VAC Left 01/09/2024   Procedure: APPLICATION, WOUND VAC;  Surgeon: Edna Toribio LABOR, MD;  Location: [facility];  Service: Orthopedics;  Laterality: Left;   CONVERSION TO TOTAL HIP Left 01/09/2024   Procedure: CONVERSION TO TOTAL HIP;  Surgeon: Edna Toribio LABOR, MD;  Location: [facility];  Service: Orthopedics;  Laterality: Left;   FEMUR IM NAIL Left 07/27/2022   Procedure: REPAIR FEMUR WITH AUTOGRAFT/ RIA;  Surgeon: Celena Sharper, MD;  Location: [facility] OR;  Service: Orthopedics;  Laterality: Left;   HARDWARE REMOVAL Left 06/06/2022   Procedure: HARDWARE REMOVAL;  Surgeon: Celena Sharper, MD;  Location: [facility] OR;  Service: Orthopedics;  Laterality: Left;   HARDWARE REMOVAL Bilateral 07/27/2022   Procedure: REPAIR OF LEFT PROXIMAL FEMUR NONUNION, REMOVAL OF ANTIBIOTIC SPACER, REAMED INTERMEDULLARY ASPRIATE RIGHT FEMUR;  Surgeon: Celena Sharper, MD;  Location: [facility] OR;  Service: Orthopedics;  Laterality: Bilateral;   HIP CLOSED REDUCTION Left 01/19/2024   Procedure: Attempted CLOSED REDUCTION, HIP;  Surgeon: Cristy Bonner DASEN, MD;  Location[facility] OR;  Service: Orthopedics;  Laterality: Left;   INTRAMEDULLARY (IM) NAIL INTERTROCHANTERIC Left 05/11/2021   Procedure: INTRAMEDULLARY (IM) NAIL INTERTROCHANTRIC OF LEFT SUBTHROCHANTRIC HIP FRACTURE;  Surgeon: Edie Norleen PARAS, MD;  Location: [facility] ORS;  Service: Orthopedics;  Laterality: Left;   ORIF FEMUR FRACTURE Left 06/06/2022   Procedure: OPEN REDUCTION INTERNAL FIXATION FEMORAL PROXIMAL FRACTURE;  Surgeon: Celena Sharper,  MD;  Location: [facility] OR;  Service: Orthopedics;  Laterality: Left;   TOTAL HIP REVISION Left 01/21/2024   Procedure: Open Hip Reduction with Local Tissue Advancement;  Surgeon: Edna Toribio LABOR, MD;  Location: [facility] OR;  Service: Orthopedics;   Laterality: Left;    Current Outpatient Medications  Medication Sig Dispense Refill Last Dose/Taking   acetaminophen  (TYLENOL ) 500 MG tablet Take 1,000 mg by mouth in the morning and at bedtime.      amLODipine  (NORVASC ) 10 MG tablet Take 10 mg by mouth in the morning.      aspirin  EC 81 MG tablet Take 1 tablet (81 mg total) by mouth 2 (two) times daily for 28 days. Swallow whole.      atorvastatin  (LIPITOR) 20 MG tablet Take 20 mg by mouth at bedtime.      cyclobenzaprine  (FLEXERIL ) 10 MG tablet Take 1 tablet (10 mg total) by mouth every 12 (twelve) hours as needed for muscle spasms. 40 tablet 0    diclofenac  (VOLTAREN ) 75 MG EC tablet Take 1 tablet (75 mg total) by mouth 2 (two) times daily. 60 tablet 0    ferrous sulfate  325 (65 FE) MG tablet Take 1 tablet (325 mg total) by mouth daily with breakfast. (Patient not taking: Reported on 02/13/2024) 30 tablet 0    folic acid  (FOLVITE ) 1 MG tablet Take 1 tablet (1 mg total) by mouth daily. (Patient not taking: Reported on 02/13/2024) 30 tablet 0    guaiFENesin  (MUCINEX ) 600 MG 12 hr tablet Take 2 tablets (1,200 mg total) by mouth 2 (two) times daily. (Patient not taking: Reported on 02/13/2024) 120 tablet 0    magnesium  hydroxide (MILK OF MAGNESIA) 400 MG/5ML suspension Take 15 mLs by mouth daily as needed (constipation.).      Menthol , Topical Analgesic, (BIOFREEZE EX) Apply 1 Application topically daily as needed (pain).      menthol -cetylpyridinium (CEPACOL) 3 MG lozenge Take 1 lozenge (3 mg total) by mouth as needed for sore throat. (Patient not taking: Reported on 02/13/2024) 100 tablet 12    metoprolol  tartrate (LOPRESSOR ) 100 MG tablet Take 100 mg by mouth 2 (two) times daily.      Metoprolol  Tartrate 75 MG TABS Take 1 tablet (75 mg total) by mouth 2 (two) times daily. (Patient not taking: Reported on 02/13/2024) 30 tablet 0    ondansetron  (ZOFRAN ) 4 MG tablet Take 1 tablet (4 mg total) by mouth every 6 (six) hours as needed for nausea. 20 tablet 0     polyethylene glycol (MIRALAX  / GLYCOLAX ) 17 g packet Take 17 g by mouth 2 (two) times daily. (Patient not taking: Reported on 02/13/2024) 14 each 0    senna (SENOKOT) 8.6 MG TABS tablet Take 1 tablet (8.6 mg total) by mouth 2 (two) times daily. (Patient not taking: Reported on 02/13/2024) 120 tablet 0    No current facility-administered medications for this visit.   No Known Allergies  Social History   Tobacco Use   Smoking status: Former    Current packs/day: 0.00    Types: Cigarettes    Quit date: 06/01/2022    Years since quitting: 1.7   Smokeless tobacco: Never  Substance Use Topics   Alcohol  use: Yes    Comment: occ beer    Family History  Problem Relation Age of Onset   Heart attack Mother    Hypertension Brother    Heart failure Paternal Grandfather      Review of Systems  Musculoskeletal:  Positive for arthralgias.  Skin:  Positive for wound.  All other systems reviewed and are negative.   Objective:  Physical Exam Constitutional:      General: He is not in acute distress.    Appearance: Normal appearance. He is normal weight.  HENT:     Head: Normocephalic and atraumatic.  Eyes:     Extraocular Movements: Extraocular movements intact.     Conjunctiva/sclera: Conjunctivae normal.     Pupils: Pupils are equal, round, and reactive to light.  Cardiovascular:     Rate and Rhythm: Normal rate and regular rhythm.     Pulses: Normal pulses.     Heart sounds: Normal heart sounds.  Pulmonary:     Effort: Pulmonary effort is normal. No respiratory distress.     Breath sounds: Normal breath sounds.  Abdominal:     General: Bowel sounds are normal. There is no distension.     Palpations: Abdomen is soft.     Tenderness: There is no abdominal tenderness.  Musculoskeletal:        General: Tenderness present.     Cervical back: Normal range of motion and neck supple.     Comments: Tolerates gentle left hip ROM.  BLE appear grossly neurovascularly intact.   Dorsiflexion and plantar flexion intact.  Lymphadenopathy:     Cervical: No cervical adenopathy.  Skin:    General: Skin is warm and dry.     Capillary Refill: Capillary refill takes less than 2 seconds.     Findings: No erythema or rash.     Comments: Incisional wound of left hip with delayed healing, continuous clear-golden drainage.  No significant blood or erythema.  Notable post-operative swelling of left hip, tender to touch.  BLE otherwise with skin intact.  Neurological:     General: No focal deficit present.     Mental Status: He is alert and oriented to person, place, and time.  Psychiatric:        Mood and Affect: Mood normal.        Behavior: Behavior normal.     Vital signs in last 24 hours: @VSRANGES @  Labs:   Estimated body mass index is 34.87 kg/m as calculated from the following:   Height as of 02/14/24: 5' 7 (1.702 m).   Weight as of 02/14/24: 101 kg.   Imaging Review Plain radiographs demonstrate revisional left total hip arthroplasty components in good position without adverse features. The bone quality appears to be poor for age and reported activity level.      Assessment/Plan:  Delayed wound healing of surgical incision, left hip  The patient history, physical examination, clinical judgement of the provider and imaging studies are consistent with delayed wound healing of the hip(s) following recent revision and subsequent revision of left hip components.  Incision and drainage with likely wound vac application is deemed medically necessary. The various treatment options were discussed at length. The risks and benefits of incision and drainage were presented and reviewed. The risks due to aseptic loosening, infection, stiffness, dislocation/subluxation, thromboembolic complications and other imponderables were discussed.  The patient acknowledged the explanation, agreed to proceed with the plan and consent was signed. Patient is being admitted for  inpatient treatment for surgery, pain control, PT, OT, prophylactic antibiotics, VTE prophylaxis, progressive ambulation and ADL's and discharge planning.The patient is planning to be discharged likely with HHPT ([facility]).

## 2024-02-15 NOTE — Anesthesia Preprocedure Evaluation (Addendum)
 Anesthesia Evaluation  Patient identified by MRN, date of birth, ID band Patient awake    Reviewed: Allergy & Precautions, NPO status , Patient's Chart, lab work & pertinent test results  History of Anesthesia Complications Negative for: history of anesthetic complications  Airway Mallampati: I  TM Distance: >3 FB Neck ROM: Full   Comment: Previous grade I view with Miller 2, easy mask Dental  (+) Dental Advisory Given, Edentulous Lower Only has 2 teeth on the top.:   Pulmonary neg shortness of breath, neg sleep apnea, neg COPD, neg recent URI, former smoker   Pulmonary exam normal breath sounds clear to auscultation       Cardiovascular hypertension (amlodipine , metoprolol ), Pt. on medications and Pt. on home beta blockers (-) angina (-) Past MI, (-) Cardiac Stents and (-) CABG (-) dysrhythmias  Rhythm:Regular Rate:Normal  HLD   Neuro/Psych neg Seizures CVA (2010)    GI/Hepatic negative GI ROS, Neg liver ROS,,,  Endo/Other  negative endocrine ROS    Renal/GU negative Renal ROS     Musculoskeletal  (+) Arthritis ,    Abdominal  (+) + obese  Peds  Hematology  (+) Blood dyscrasia, anemia Lab Results      Component                Value               Date                      WBC                      13.8 (H)            01/24/2024                HGB                      7.1 (L)             01/24/2024                HCT                      22.6 (L)            01/24/2024                MCV                      93.4                01/24/2024                PLT                      415 (H)             01/24/2024              Anesthesia Other Findings   Reproductive/Obstetrics                              Anesthesia Physical Anesthesia Plan  ASA: 3  Anesthesia Plan: General   Post-op Pain Management: Tylenol  PO (pre-op)*   Induction: Intravenous  PONV Risk Score and Plan: 2 and  Ondansetron , Dexamethasone , Treatment may vary due to age or medical condition and Midazolam   Airway Management Planned: Oral ETT  Additional Equipment:   Intra-op Plan:   Post-operative Plan: Extubation in OR  Informed Consent: I have reviewed the patients History and Physical, chart, labs and discussed the procedure including the risks, benefits and alternatives for the proposed anesthesia with the patient or authorized representative who has indicated his/her understanding and acceptance.     Dental advisory given  Plan Discussed with: CRNA and Anesthesiologist  Anesthesia Plan Comments: (Risks of general anesthesia discussed including, but not limited to, sore throat, hoarse voice, chipped/damaged teeth, injury to vocal cords, nausea and vomiting, allergic reactions, lung infection, heart attack, stroke, and death. All questions answered. )         Anesthesia Quick Evaluation

## 2024-02-18 ENCOUNTER — Emergency Department (HOSPITAL_COMMUNITY): Payer: Self-pay | Admitting: Physician Assistant

## 2024-02-18 ENCOUNTER — Emergency Department (HOSPITAL_COMMUNITY)

## 2024-02-18 ENCOUNTER — Ambulatory Visit (HOSPITAL_COMMUNITY): Admission: RE | Admit: 2024-02-18 | Source: Home / Self Care | Admitting: Orthopedic Surgery

## 2024-02-18 ENCOUNTER — Other Ambulatory Visit: Payer: Self-pay

## 2024-02-18 ENCOUNTER — Observation Stay (HOSPITAL_COMMUNITY)

## 2024-02-18 ENCOUNTER — Emergency Department (HOSPITAL_COMMUNITY): Payer: Self-pay | Admitting: Anesthesiology

## 2024-02-18 ENCOUNTER — Encounter (HOSPITAL_COMMUNITY): Payer: Self-pay

## 2024-02-18 ENCOUNTER — Inpatient Hospital Stay (HOSPITAL_COMMUNITY)
Admission: EM | Admit: 2024-02-18 | Discharge: 2024-02-27 | DRG: 463 | Disposition: A | Discharge status: Rehabilitation Facility | Attending: Orthopedic Surgery | Admitting: Orthopedic Surgery

## 2024-02-18 ENCOUNTER — Encounter (HOSPITAL_COMMUNITY)
Admission: EM | Disposition: A | Discharge status: Rehabilitation Facility | Payer: Self-pay | Source: Home / Self Care | Attending: Orthopedic Surgery

## 2024-02-18 DIAGNOSIS — T84021A Dislocation of internal left hip prosthesis, initial encounter: Secondary | ICD-10-CM

## 2024-02-18 DIAGNOSIS — D500 Iron deficiency anemia secondary to blood loss (chronic): Secondary | ICD-10-CM | POA: Diagnosis not present

## 2024-02-18 DIAGNOSIS — E785 Hyperlipidemia, unspecified: Secondary | ICD-10-CM | POA: Diagnosis not present

## 2024-02-18 DIAGNOSIS — E871 Hypo-osmolality and hyponatremia: Secondary | ICD-10-CM | POA: Diagnosis not present

## 2024-02-18 DIAGNOSIS — I10 Essential (primary) hypertension: Secondary | ICD-10-CM | POA: Diagnosis not present

## 2024-02-18 DIAGNOSIS — Z87891 Personal history of nicotine dependence: Secondary | ICD-10-CM | POA: Diagnosis not present

## 2024-02-18 DIAGNOSIS — Z7982 Long term (current) use of aspirin: Secondary | ICD-10-CM | POA: Diagnosis not present

## 2024-02-18 DIAGNOSIS — S73015A Posterior dislocation of left hip, initial encounter: Secondary | ICD-10-CM | POA: Diagnosis not present

## 2024-02-18 DIAGNOSIS — K7689 Other specified diseases of liver: Secondary | ICD-10-CM | POA: Diagnosis present

## 2024-02-18 DIAGNOSIS — G4733 Obstructive sleep apnea (adult) (pediatric): Secondary | ICD-10-CM | POA: Diagnosis not present

## 2024-02-18 DIAGNOSIS — E782 Mixed hyperlipidemia: Secondary | ICD-10-CM | POA: Diagnosis not present

## 2024-02-18 DIAGNOSIS — I679 Cerebrovascular disease, unspecified: Secondary | ICD-10-CM

## 2024-02-18 DIAGNOSIS — Z471 Aftercare following joint replacement surgery: Secondary | ICD-10-CM | POA: Diagnosis not present

## 2024-02-18 DIAGNOSIS — R1113 Vomiting of fecal matter: Secondary | ICD-10-CM | POA: Diagnosis not present

## 2024-02-18 DIAGNOSIS — S7223XD Displaced subtrochanteric fracture of unspecified femur, subsequent encounter for closed fracture with routine healing: Secondary | ICD-10-CM | POA: Diagnosis not present

## 2024-02-18 DIAGNOSIS — M24452 Recurrent dislocation, left hip: Secondary | ICD-10-CM | POA: Diagnosis present

## 2024-02-18 DIAGNOSIS — T8149XA Infection following a procedure, other surgical site, initial encounter: Secondary | ICD-10-CM | POA: Diagnosis not present

## 2024-02-18 DIAGNOSIS — R531 Weakness: Secondary | ICD-10-CM | POA: Diagnosis not present

## 2024-02-18 DIAGNOSIS — T8189XA Other complications of procedures, not elsewhere classified, initial encounter: Secondary | ICD-10-CM

## 2024-02-18 DIAGNOSIS — Y792 Prosthetic and other implants, materials and accessory orthopedic devices associated with adverse incidents: Secondary | ICD-10-CM | POA: Diagnosis present

## 2024-02-18 DIAGNOSIS — T84021D Dislocation of internal left hip prosthesis, subsequent encounter: Secondary | ICD-10-CM | POA: Diagnosis not present

## 2024-02-18 DIAGNOSIS — K5900 Constipation, unspecified: Secondary | ICD-10-CM | POA: Diagnosis present

## 2024-02-18 DIAGNOSIS — R791 Abnormal coagulation profile: Secondary | ICD-10-CM | POA: Diagnosis not present

## 2024-02-18 DIAGNOSIS — R110 Nausea: Secondary | ICD-10-CM | POA: Diagnosis not present

## 2024-02-18 DIAGNOSIS — T8131XA Disruption of external operation (surgical) wound, not elsewhere classified, initial encounter: Secondary | ICD-10-CM | POA: Diagnosis not present

## 2024-02-18 DIAGNOSIS — D1803 Hemangioma of intra-abdominal structures: Secondary | ICD-10-CM | POA: Diagnosis present

## 2024-02-18 DIAGNOSIS — E669 Obesity, unspecified: Secondary | ICD-10-CM | POA: Diagnosis not present

## 2024-02-18 DIAGNOSIS — D6859 Other primary thrombophilia: Secondary | ICD-10-CM | POA: Diagnosis not present

## 2024-02-18 DIAGNOSIS — Z79899 Other long term (current) drug therapy: Secondary | ICD-10-CM | POA: Diagnosis not present

## 2024-02-18 DIAGNOSIS — Z791 Long term (current) use of non-steroidal anti-inflammatories (NSAID): Secondary | ICD-10-CM | POA: Diagnosis not present

## 2024-02-18 DIAGNOSIS — R29898 Other symptoms and signs involving the musculoskeletal system: Secondary | ICD-10-CM | POA: Diagnosis not present

## 2024-02-18 DIAGNOSIS — Z8673 Personal history of transient ischemic attack (TIA), and cerebral infarction without residual deficits: Secondary | ICD-10-CM

## 2024-02-18 DIAGNOSIS — J9601 Acute respiratory failure with hypoxia: Secondary | ICD-10-CM | POA: Diagnosis not present

## 2024-02-18 DIAGNOSIS — K769 Liver disease, unspecified: Secondary | ICD-10-CM | POA: Diagnosis not present

## 2024-02-18 DIAGNOSIS — K5901 Slow transit constipation: Secondary | ICD-10-CM | POA: Diagnosis not present

## 2024-02-18 DIAGNOSIS — T84020A Dislocation of internal right hip prosthesis, initial encounter: Secondary | ICD-10-CM | POA: Diagnosis not present

## 2024-02-18 DIAGNOSIS — N281 Cyst of kidney, acquired: Secondary | ICD-10-CM | POA: Diagnosis not present

## 2024-02-18 DIAGNOSIS — D62 Acute posthemorrhagic anemia: Secondary | ICD-10-CM | POA: Diagnosis not present

## 2024-02-18 DIAGNOSIS — R160 Hepatomegaly, not elsewhere classified: Secondary | ICD-10-CM | POA: Diagnosis not present

## 2024-02-18 DIAGNOSIS — M19012 Primary osteoarthritis, left shoulder: Secondary | ICD-10-CM | POA: Diagnosis not present

## 2024-02-18 DIAGNOSIS — S73005D Unspecified dislocation of left hip, subsequent encounter: Secondary | ICD-10-CM | POA: Diagnosis not present

## 2024-02-18 DIAGNOSIS — Z6834 Body mass index (BMI) 34.0-34.9, adult: Secondary | ICD-10-CM

## 2024-02-18 DIAGNOSIS — Z9181 History of falling: Secondary | ICD-10-CM | POA: Diagnosis not present

## 2024-02-18 DIAGNOSIS — M62838 Other muscle spasm: Secondary | ICD-10-CM | POA: Diagnosis not present

## 2024-02-18 DIAGNOSIS — M25552 Pain in left hip: Principal | ICD-10-CM

## 2024-02-18 DIAGNOSIS — Z8249 Family history of ischemic heart disease and other diseases of the circulatory system: Secondary | ICD-10-CM | POA: Diagnosis not present

## 2024-02-18 DIAGNOSIS — Z01818 Encounter for other preprocedural examination: Secondary | ICD-10-CM

## 2024-02-18 DIAGNOSIS — R112 Nausea with vomiting, unspecified: Secondary | ICD-10-CM | POA: Diagnosis not present

## 2024-02-18 DIAGNOSIS — S72142K Displaced intertrochanteric fracture of left femur, subsequent encounter for closed fracture with nonunion: Secondary | ICD-10-CM | POA: Diagnosis not present

## 2024-02-18 DIAGNOSIS — M1990 Unspecified osteoarthritis, unspecified site: Secondary | ICD-10-CM | POA: Diagnosis not present

## 2024-02-18 DIAGNOSIS — D649 Anemia, unspecified: Secondary | ICD-10-CM | POA: Diagnosis not present

## 2024-02-18 DIAGNOSIS — Z7901 Long term (current) use of anticoagulants: Secondary | ICD-10-CM

## 2024-02-18 DIAGNOSIS — Z96642 Presence of left artificial hip joint: Secondary | ICD-10-CM | POA: Diagnosis not present

## 2024-02-18 DIAGNOSIS — M79605 Pain in left leg: Secondary | ICD-10-CM | POA: Diagnosis not present

## 2024-02-18 DIAGNOSIS — R000 Tachycardia, unspecified: Secondary | ICD-10-CM | POA: Diagnosis not present

## 2024-02-18 DIAGNOSIS — D72829 Elevated white blood cell count, unspecified: Secondary | ICD-10-CM | POA: Diagnosis not present

## 2024-02-18 DIAGNOSIS — E538 Deficiency of other specified B group vitamins: Secondary | ICD-10-CM | POA: Diagnosis not present

## 2024-02-18 DIAGNOSIS — R5381 Other malaise: Secondary | ICD-10-CM | POA: Diagnosis not present

## 2024-02-18 HISTORY — PX: HIP CLOSED REDUCTION: SHX983

## 2024-02-18 HISTORY — PX: INCISION AND DRAINAGE OF WOUND: SHX1803

## 2024-02-18 HISTORY — PX: APPLICATION OF WOUND VAC: SHX5189

## 2024-02-18 LAB — CBC WITH DIFFERENTIAL/PLATELET
Abs Immature Granulocytes: 0.06 K/uL (ref 0.00–0.07)
Basophils Absolute: 0 K/uL (ref 0.0–0.1)
Basophils Relative: 0 %
Eosinophils Absolute: 0 K/uL (ref 0.0–0.5)
Eosinophils Relative: 0 %
HCT: 31.4 % — ABNORMAL LOW (ref 39.0–52.0)
Hemoglobin: 9.5 g/dL — ABNORMAL LOW (ref 13.0–17.0)
Immature Granulocytes: 1 %
Lymphocytes Relative: 7 %
Lymphs Abs: 0.7 K/uL (ref 0.7–4.0)
MCH: 27.3 pg (ref 26.0–34.0)
MCHC: 30.3 g/dL (ref 30.0–36.0)
MCV: 90.2 fL (ref 80.0–100.0)
Monocytes Absolute: 0.5 K/uL (ref 0.1–1.0)
Monocytes Relative: 5 %
Neutro Abs: 9.1 K/uL — ABNORMAL HIGH (ref 1.7–7.7)
Neutrophils Relative %: 87 %
Platelets: 558 K/uL — ABNORMAL HIGH (ref 150–400)
RBC: 3.48 MIL/uL — ABNORMAL LOW (ref 4.22–5.81)
RDW: 16.3 % — ABNORMAL HIGH (ref 11.5–15.5)
WBC: 10.5 K/uL (ref 4.0–10.5)
nRBC: 0 % (ref 0.0–0.2)

## 2024-02-18 LAB — COMPREHENSIVE METABOLIC PANEL WITH GFR
ALT: 8 U/L (ref 0–44)
AST: 13 U/L — ABNORMAL LOW (ref 15–41)
Albumin: 2.1 g/dL — ABNORMAL LOW (ref 3.5–5.0)
Alkaline Phosphatase: 131 U/L — ABNORMAL HIGH (ref 38–126)
Anion gap: 12 (ref 5–15)
BUN: 11 mg/dL (ref 8–23)
CO2: 28 mmol/L (ref 22–32)
Calcium: 8.2 mg/dL — ABNORMAL LOW (ref 8.9–10.3)
Chloride: 95 mmol/L — ABNORMAL LOW (ref 98–111)
Creatinine, Ser: 0.82 mg/dL (ref 0.61–1.24)
GFR, Estimated: >60 mL/min (ref >60)
Glucose, Bld: 96 mg/dL (ref 70–99)
Potassium: 4.6 mmol/L (ref 3.5–5.1)
Sodium: 135 mmol/L (ref 135–145)
Total Bilirubin: 1 mg/dL (ref 0.0–1.2)
Total Protein: 6.9 g/dL (ref 6.5–8.1)

## 2024-02-18 SURGERY — IRRIGATION AND DEBRIDEMENT WOUND
Anesthesia: General | Site: Hip | Laterality: Left

## 2024-02-18 MED ORDER — CEFAZOLIN SODIUM-DEXTROSE 2-4 GM/100ML-% IV SOLN
2.0000 g | INTRAVENOUS | Status: AC
  Administered 2024-02-18: 2 g via INTRAVENOUS
  Filled 2024-02-18: qty 100

## 2024-02-18 MED ORDER — SODIUM CHLORIDE 0.9 % IR SOLN
Status: DC | PRN
  Administered 2024-02-18: 3000 mL

## 2024-02-18 MED ORDER — GLYCOPYRROLATE 0.2 MG/ML IJ SOLN
INTRAMUSCULAR | Status: AC
  Filled 2024-02-18: qty 1

## 2024-02-18 MED ORDER — LIDOCAINE HCL (PF) 2 % IJ SOLN
INTRAMUSCULAR | Status: AC
Start: 2024-02-18 — End: 2024-02-18
  Filled 2024-02-18: qty 5

## 2024-02-18 MED ORDER — FERROUS SULFATE 325 (65 FE) MG PO TABS
325.0000 mg | ORAL_TABLET | Freq: Every day | ORAL | Status: DC
  Administered 2024-02-19 – 2024-02-26 (×8): 325 mg via ORAL
  Filled 2024-02-18 (×9): qty 1

## 2024-02-18 MED ORDER — AMLODIPINE BESYLATE 10 MG PO TABS
10.0000 mg | ORAL_TABLET | Freq: Every day | ORAL | Status: DC
  Administered 2024-02-19 – 2024-02-26 (×5): 10 mg via ORAL
  Filled 2024-02-18 (×9): qty 1

## 2024-02-18 MED ORDER — ALBUMIN HUMAN 5 % IV SOLN: INTRAVENOUS | Status: DC | PRN

## 2024-02-18 MED ORDER — PROPOFOL 500 MG/50ML IV EMUL
INTRAVENOUS | Status: AC
  Filled 2024-02-18: qty 50

## 2024-02-18 MED ORDER — AMISULPRIDE (ANTIEMETIC) 5 MG/2ML IV SOLN: 10.0000 mg | Freq: Once | INTRAVENOUS | Status: DC | PRN

## 2024-02-18 MED ORDER — SUGAMMADEX SODIUM 200 MG/2ML IV SOLN
INTRAVENOUS | Status: DC | PRN
  Administered 2024-02-18: 200 mg via INTRAVENOUS

## 2024-02-18 MED ORDER — PHENYLEPHRINE 80 MCG/ML (10ML) SYRINGE FOR IV PUSH (FOR BLOOD PRESSURE SUPPORT)
PREFILLED_SYRINGE | INTRAVENOUS | Status: AC
  Filled 2024-02-18: qty 10

## 2024-02-18 MED ORDER — TRANEXAMIC ACID-NACL 1000-0.7 MG/100ML-% IV SOLN
1000.0000 mg | INTRAVENOUS | Status: AC
  Administered 2024-02-18: 1000 mg via INTRAVENOUS
  Filled 2024-02-18: qty 100

## 2024-02-18 MED ORDER — ONDANSETRON HCL 4 MG/2ML IJ SOLN
INTRAMUSCULAR | Status: DC | PRN
  Administered 2024-02-18: 4 mg via INTRAVENOUS

## 2024-02-18 MED ORDER — FENTANYL CITRATE PF 50 MCG/ML IJ SOSY
25.0000 ug | PREFILLED_SYRINGE | INTRAMUSCULAR | Status: DC | PRN
  Administered 2024-02-18 (×3): 50 ug via INTRAVENOUS

## 2024-02-18 MED ORDER — METHOCARBAMOL 1000 MG/10ML IJ SOLN
500.0000 mg | Freq: Four times a day (QID) | INTRAMUSCULAR | Status: DC | PRN
  Administered 2024-02-18: 500 mg via INTRAVENOUS

## 2024-02-18 MED ORDER — ONDANSETRON HCL 4 MG/2ML IJ SOLN
4.0000 mg | Freq: Four times a day (QID) | INTRAMUSCULAR | Status: DC | PRN
  Administered 2024-02-19 – 2024-02-20 (×4): 4 mg via INTRAVENOUS
  Filled 2024-02-18 (×4): qty 2

## 2024-02-18 MED ORDER — FENTANYL CITRATE PF 50 MCG/ML IJ SOSY
PREFILLED_SYRINGE | INTRAMUSCULAR | Status: AC
  Filled 2024-02-18: qty 1

## 2024-02-18 MED ORDER — OXYCODONE HCL 5 MG PO TABS: 5.0000 mg | ORAL_TABLET | ORAL | 0 refills | Status: AC | PRN

## 2024-02-18 MED ORDER — ROCURONIUM BROMIDE 10 MG/ML (PF) SYRINGE
PREFILLED_SYRINGE | INTRAVENOUS | Status: AC
  Filled 2024-02-18: qty 10

## 2024-02-18 MED ORDER — OXYCODONE HCL 5 MG PO TABS
5.0000 mg | ORAL_TABLET | ORAL | Status: DC | PRN
  Administered 2024-02-18 – 2024-02-24 (×9): 10 mg via ORAL
  Administered 2024-02-24 – 2024-02-25 (×3): 5 mg via ORAL
  Filled 2024-02-18 (×3): qty 2
  Filled 2024-02-18: qty 1
  Filled 2024-02-18 (×4): qty 2
  Filled 2024-02-18: qty 1
  Filled 2024-02-18 (×2): qty 2
  Filled 2024-02-18: qty 1
  Filled 2024-02-18: qty 2

## 2024-02-18 MED ORDER — OXYCODONE HCL 5 MG/5ML PO SOLN: 5.0000 mg | Freq: Once | ORAL | Status: DC | PRN

## 2024-02-18 MED ORDER — LACTATED RINGERS IV SOLN: INTRAVENOUS | Status: DC

## 2024-02-18 MED ORDER — METHOCARBAMOL 500 MG PO TABS
500.0000 mg | ORAL_TABLET | Freq: Four times a day (QID) | ORAL | Status: DC | PRN
  Administered 2024-02-18 – 2024-02-23 (×5): 500 mg via ORAL
  Filled 2024-02-18 (×5): qty 1

## 2024-02-18 MED ORDER — METOPROLOL TARTRATE 50 MG PO TABS
100.0000 mg | ORAL_TABLET | Freq: Two times a day (BID) | ORAL | Status: DC
  Administered 2024-02-18 – 2024-02-26 (×13): 100 mg via ORAL
  Filled 2024-02-18 (×16): qty 2

## 2024-02-18 MED ORDER — POLYETHYLENE GLYCOL 3350 17 G PO PACK: 17.0000 g | PACK | Freq: Every day | ORAL | 0 refills | Status: DC

## 2024-02-18 MED ORDER — PROPOFOL 10 MG/ML IV BOLUS
INTRAVENOUS | Status: DC | PRN
Start: 2024-02-18 — End: 2024-02-18
  Administered 2024-02-18: 150 mg via INTRAVENOUS

## 2024-02-18 MED ORDER — ASPIRIN 81 MG PO TBEC: 81.0000 mg | DELAYED_RELEASE_TABLET | Freq: Two times a day (BID) | ORAL | Status: DC

## 2024-02-18 MED ORDER — DEXAMETHASONE SODIUM PHOSPHATE 10 MG/ML IJ SOLN
INTRAMUSCULAR | Status: AC
Start: 2024-02-18 — End: 2024-02-18
  Filled 2024-02-18: qty 1

## 2024-02-18 MED ORDER — KETOROLAC TROMETHAMINE 30 MG/ML IJ SOLN
INTRAMUSCULAR | Status: DC | PRN
  Administered 2024-02-18: 30 mg via INTRAVENOUS

## 2024-02-18 MED ORDER — ISOPROPYL ALCOHOL 70 % SOLN
Status: AC
  Filled 2024-02-18: qty 480

## 2024-02-18 MED ORDER — SODIUM CHLORIDE 0.9 % IV SOLN: INTRAVENOUS | Status: DC

## 2024-02-18 MED ORDER — KETOROLAC TROMETHAMINE 30 MG/ML IJ SOLN
INTRAMUSCULAR | Status: AC
Start: 2024-02-18 — End: 2024-02-18
  Filled 2024-02-18: qty 1

## 2024-02-18 MED ORDER — ZOLPIDEM TARTRATE 5 MG PO TABS: 5.0000 mg | ORAL_TABLET | Freq: Every evening | ORAL | Status: DC | PRN

## 2024-02-18 MED ORDER — LIDOCAINE HCL (CARDIAC) PF 100 MG/5ML IV SOSY
PREFILLED_SYRINGE | INTRAVENOUS | Status: DC | PRN
  Administered 2024-02-18: 100 mg via INTRAVENOUS

## 2024-02-18 MED ORDER — MENTHOL 3 MG MT LOZG: 1.0000 | LOZENGE | OROMUCOSAL | Status: DC | PRN

## 2024-02-18 MED ORDER — CHLORHEXIDINE GLUCONATE 0.12 % MT SOLN
15.0000 mL | Freq: Once | OROMUCOSAL | Status: AC
  Administered 2024-02-18: 15 mL via OROMUCOSAL

## 2024-02-18 MED ORDER — FOLIC ACID 1 MG PO TABS
1.0000 mg | ORAL_TABLET | Freq: Every day | ORAL | Status: DC
  Administered 2024-02-19 – 2024-02-26 (×8): 1 mg via ORAL
  Filled 2024-02-18 (×9): qty 1

## 2024-02-18 MED ORDER — CEFAZOLIN SODIUM-DEXTROSE 2-4 GM/100ML-% IV SOLN
2.0000 g | Freq: Four times a day (QID) | INTRAVENOUS | Status: AC
  Administered 2024-02-18 (×2): 2 g via INTRAVENOUS
  Filled 2024-02-18 (×2): qty 100

## 2024-02-18 MED ORDER — ORAL CARE MOUTH RINSE: 15.0000 mL | Freq: Once | OROMUCOSAL | Status: AC

## 2024-02-18 MED ORDER — MIDAZOLAM HCL 2 MG/2ML IJ SOLN
INTRAMUSCULAR | Status: AC
  Filled 2024-02-18: qty 2

## 2024-02-18 MED ORDER — ACETAMINOPHEN 500 MG PO TABS
1000.0000 mg | ORAL_TABLET | Freq: Once | ORAL | Status: AC
  Administered 2024-02-18: 1000 mg via ORAL
  Filled 2024-02-18: qty 2

## 2024-02-18 MED ORDER — METHOCARBAMOL 1000 MG/10ML IJ SOLN
INTRAMUSCULAR | Status: AC
  Filled 2024-02-18: qty 10

## 2024-02-18 MED ORDER — ACETAMINOPHEN 325 MG PO TABS
325.0000 mg | ORAL_TABLET | Freq: Four times a day (QID) | ORAL | Status: DC | PRN
  Administered 2024-02-19 – 2024-02-27 (×8): 650 mg via ORAL
  Filled 2024-02-18 (×8): qty 2

## 2024-02-18 MED ORDER — ALBUMIN HUMAN 5 % IV SOLN
INTRAVENOUS | Status: AC
  Filled 2024-02-18: qty 250

## 2024-02-18 MED ORDER — DOCUSATE SODIUM 100 MG PO CAPS
100.0000 mg | ORAL_CAPSULE | Freq: Two times a day (BID) | ORAL | Status: DC
  Administered 2024-02-18 – 2024-02-26 (×15): 100 mg via ORAL
  Filled 2024-02-18 (×17): qty 1

## 2024-02-18 MED ORDER — ATORVASTATIN CALCIUM 20 MG PO TABS
20.0000 mg | ORAL_TABLET | Freq: Every day | ORAL | Status: DC
  Administered 2024-02-18 – 2024-02-26 (×8): 20 mg via ORAL
  Filled 2024-02-18 (×8): qty 1

## 2024-02-18 MED ORDER — FENTANYL CITRATE (PF) 100 MCG/2ML IJ SOLN
INTRAMUSCULAR | Status: AC
  Filled 2024-02-18: qty 2

## 2024-02-18 MED ORDER — ASPIRIN 81 MG PO CHEW
81.0000 mg | CHEWABLE_TABLET | Freq: Two times a day (BID) | ORAL | Status: DC
  Administered 2024-02-18 – 2024-02-19 (×3): 81 mg via ORAL
  Filled 2024-02-18 (×3): qty 1

## 2024-02-18 MED ORDER — FENTANYL CITRATE (PF) 100 MCG/2ML IJ SOLN
INTRAMUSCULAR | Status: DC | PRN
  Administered 2024-02-18: 100 ug via INTRAVENOUS

## 2024-02-18 MED ORDER — CYCLOBENZAPRINE HCL 10 MG PO TABS: 10.0000 mg | ORAL_TABLET | Freq: Two times a day (BID) | ORAL | 0 refills | Status: DC | PRN

## 2024-02-18 MED ORDER — ONDANSETRON HCL 4 MG PO TABS
4.0000 mg | ORAL_TABLET | Freq: Four times a day (QID) | ORAL | Status: DC | PRN
  Administered 2024-02-21 – 2024-02-22 (×2): 4 mg via ORAL
  Filled 2024-02-18 (×2): qty 1

## 2024-02-18 MED ORDER — DIPHENHYDRAMINE HCL 12.5 MG/5ML PO ELIX: 12.5000 mg | ORAL_SOLUTION | ORAL | Status: DC | PRN

## 2024-02-18 MED ORDER — SULFAMETHOXAZOLE-TRIMETHOPRIM 800-160 MG PO TABS: 1.0000 | ORAL_TABLET | Freq: Two times a day (BID) | ORAL | 0 refills | Status: DC

## 2024-02-18 MED ORDER — DEXAMETHASONE SODIUM PHOSPHATE 10 MG/ML IJ SOLN
4.0000 mg | Freq: Once | INTRAMUSCULAR | Status: AC
  Administered 2024-02-18: 8 mg via INTRAVENOUS

## 2024-02-18 MED ORDER — ACETAMINOPHEN 500 MG PO TABS: 1000.0000 mg | ORAL_TABLET | Freq: Once | ORAL | Status: DC

## 2024-02-18 MED ORDER — FENTANYL CITRATE PF 50 MCG/ML IJ SOSY
PREFILLED_SYRINGE | INTRAMUSCULAR | Status: AC
  Filled 2024-02-18: qty 2

## 2024-02-18 MED ORDER — PHENYLEPHRINE HCL-NACL 20-0.9 MG/250ML-% IV SOLN
INTRAVENOUS | Status: DC | PRN
Start: 2024-02-18 — End: 2024-02-18
  Administered 2024-02-18: 15 ug/min via INTRAVENOUS

## 2024-02-18 MED ORDER — KETOROLAC TROMETHAMINE 15 MG/ML IJ SOLN
7.5000 mg | Freq: Four times a day (QID) | INTRAMUSCULAR | Status: AC
  Administered 2024-02-18 – 2024-02-19 (×4): 7.5 mg via INTRAVENOUS
  Filled 2024-02-18 (×4): qty 1

## 2024-02-18 MED ORDER — ROCURONIUM BROMIDE 100 MG/10ML IV SOLN
INTRAVENOUS | Status: DC | PRN
  Administered 2024-02-18: 20 mg via INTRAVENOUS
  Administered 2024-02-18: 60 mg via INTRAVENOUS

## 2024-02-18 MED ORDER — 0.9 % SODIUM CHLORIDE (POUR BTL) OPTIME
TOPICAL | Status: DC | PRN
  Administered 2024-02-18: 1000 mL

## 2024-02-18 MED ORDER — SUGAMMADEX SODIUM 200 MG/2ML IV SOLN
INTRAVENOUS | Status: AC
  Filled 2024-02-18: qty 2

## 2024-02-18 MED ORDER — HYDROGEN PEROXIDE 3 % EX SOLN
CUTANEOUS | Status: DC | PRN
Start: 2024-02-18 — End: 2024-02-18
  Administered 2024-02-18: 1 via TOPICAL

## 2024-02-18 MED ORDER — PANTOPRAZOLE SODIUM 40 MG PO TBEC
40.0000 mg | DELAYED_RELEASE_TABLET | Freq: Every day | ORAL | Status: DC
  Administered 2024-02-19: 40 mg via ORAL
  Filled 2024-02-18: qty 1

## 2024-02-18 MED ORDER — HYDROGEN PEROXIDE 3 % EX SOLN
CUTANEOUS | Status: AC
  Filled 2024-02-18: qty 473

## 2024-02-18 MED ORDER — PHENOL 1.4 % MT LIQD: 1.0000 | OROMUCOSAL | Status: DC | PRN

## 2024-02-18 MED ORDER — POLYETHYLENE GLYCOL 3350 17 G PO PACK
17.0000 g | PACK | Freq: Every day | ORAL | Status: DC | PRN
  Administered 2024-02-25: 17 g via ORAL
  Filled 2024-02-18: qty 1

## 2024-02-18 MED ORDER — VANCOMYCIN HCL 1000 MG IV SOLR
INTRAVENOUS | Status: DC | PRN
  Administered 2024-02-18: 1000 mg

## 2024-02-18 MED ORDER — OXYCODONE HCL 5 MG PO TABS: 5.0000 mg | ORAL_TABLET | Freq: Once | ORAL | Status: DC | PRN

## 2024-02-18 MED ORDER — VANCOMYCIN HCL 1000 MG IV SOLR
INTRAVENOUS | Status: AC
  Filled 2024-02-18: qty 20

## 2024-02-18 MED ORDER — BUPIVACAINE-EPINEPHRINE (PF) 0.25% -1:200000 IJ SOLN
INTRAMUSCULAR | Status: AC
  Filled 2024-02-18: qty 30

## 2024-02-18 MED ORDER — ONDANSETRON HCL 4 MG/2ML IJ SOLN
INTRAMUSCULAR | Status: AC
  Filled 2024-02-18: qty 2

## 2024-02-18 MED ORDER — ACETAMINOPHEN 500 MG PO TABS
1000.0000 mg | ORAL_TABLET | Freq: Four times a day (QID) | ORAL | Status: AC
  Administered 2024-02-18 – 2024-02-19 (×4): 1000 mg via ORAL
  Filled 2024-02-18 (×4): qty 2

## 2024-02-18 MED ORDER — HYDROMORPHONE HCL 1 MG/ML IJ SOLN
0.5000 mg | INTRAMUSCULAR | Status: DC | PRN
  Administered 2024-02-18: 1 mg via INTRAVENOUS
  Filled 2024-02-18: qty 1

## 2024-02-18 MED ORDER — CELECOXIB 100 MG PO CAPS: 100.0000 mg | ORAL_CAPSULE | Freq: Two times a day (BID) | ORAL | 0 refills | Status: DC

## 2024-02-18 MED ORDER — SENNA 8.6 MG PO TABS
1.0000 | ORAL_TABLET | Freq: Two times a day (BID) | ORAL | Status: DC
  Administered 2024-02-18 – 2024-02-26 (×14): 8.6 mg via ORAL
  Filled 2024-02-18 (×17): qty 1

## 2024-02-18 SURGICAL SUPPLY — 61 items
BLADE SURG 15 STRL LF DISP TIS (BLADE) ×4 IMPLANT
BLADE SURG SZ10 CARB STEEL (BLADE) ×2 IMPLANT
CANISTER WOUNDNEG PRESSURE 500 (CANNISTER) IMPLANT
CLEANER CAUTERY TIP PAD (MISCELLANEOUS) ×2 IMPLANT
CLSR STERI-STRIP ANTIMIC 1/2X4 (GAUZE/BANDAGES/DRESSINGS) IMPLANT
DRAPE C-ARM 42X120 X-RAY (DRAPES) ×2 IMPLANT
DRAPE C-ARMOR (DRAPES) ×2 IMPLANT
DRAPE HIP W/POCKET STRL (MISCELLANEOUS) IMPLANT
DRAPE IMP U-DRAPE 54X76 (DRAPES) ×4 IMPLANT
DRAPE INCISE IOBAN 66X45 STRL (DRAPES) ×2 IMPLANT
DRAPE SURG 17X23 STRL (DRAPES) IMPLANT
DRAPE SURG ORHT 6 SPLT 77X108 (DRAPES) ×6 IMPLANT
DRAPE U-SHAPE 47X51 STRL (DRAPES) ×4 IMPLANT
DRESSING PEEL AND PLAC PRVNA20 (GAUZE/BANDAGES/DRESSINGS) IMPLANT
DRESSING PREVENA PLUS CUSTOM (GAUZE/BANDAGES/DRESSINGS) IMPLANT
DURAPREP 26ML APPLICATOR (WOUND CARE) ×4 IMPLANT
ELECT REM PT RETURN 15FT ADLT (MISCELLANEOUS) ×2 IMPLANT
EVACUATOR 1/8 PVC DRAIN (DRAIN) ×2 IMPLANT
GAUZE 4X4 16PLY ~~LOC~~+RFID DBL (SPONGE) IMPLANT
GAUZE SPONGE 4X4 12PLY STRL (GAUZE/BANDAGES/DRESSINGS) ×2 IMPLANT
GAUZE XEROFORM 1X8 LF (GAUZE/BANDAGES/DRESSINGS) IMPLANT
GLOVE BIO SURGEON STRL SZ7.5 (GLOVE) ×2 IMPLANT
GLOVE BIO SURGEON STRL SZ8 (GLOVE) ×2 IMPLANT
GLOVE BIOGEL PI IND STRL 7.5 (GLOVE) ×2 IMPLANT
GLOVE BIOGEL PI IND STRL 8 (GLOVE) ×2 IMPLANT
GOWN STRL REUS W/ TWL LRG LVL3 (GOWN DISPOSABLE) ×2 IMPLANT
GOWN STRL REUS W/ TWL XL LVL3 (GOWN DISPOSABLE) ×2 IMPLANT
GRAFT SKIN WND SURGICLOSE M95 (Tissue) IMPLANT
KIT BASIN OR (CUSTOM PROCEDURE TRAY) ×2 IMPLANT
KIT DRSG PREVENA PLUS 7DAY 125 (MISCELLANEOUS) IMPLANT
KIT TURNOVER KIT A (KITS) ×2 IMPLANT
LAVAGE JET IRRISEPT WOUND (IRRIGATION / IRRIGATOR) IMPLANT
NS IRRIG 1000ML POUR BTL (IV SOLUTION) ×2 IMPLANT
PACK ORTHO EXTREMITY (CUSTOM PROCEDURE TRAY) ×2 IMPLANT
PACK TOTAL JOINT (CUSTOM PROCEDURE TRAY) IMPLANT
PENCIL SMOKE EVACUATOR (MISCELLANEOUS) ×2 IMPLANT
SET HNDPC FAN SPRY TIP SCT (DISPOSABLE) ×2 IMPLANT
SLING ARM FOAM STRAP LRG (SOFTGOODS) IMPLANT
SPIKE FLUID TRANSFER (MISCELLANEOUS) IMPLANT
SPONGE T-LAP 18X18 ~~LOC~~+RFID (SPONGE) ×2 IMPLANT
SPONGE T-LAP 4X18 ~~LOC~~+RFID (SPONGE) IMPLANT
SUCTION TUBE FRAZIER 10FR DISP (SUCTIONS) IMPLANT
SUPPORT WRAP ARM LG (MISCELLANEOUS) IMPLANT
SUT ETHILON 3 0 PS 1 (SUTURE) IMPLANT
SUT MNCRL AB 3-0 PS2 18 (SUTURE) IMPLANT
SUT MNCRL AB 4-0 PS2 18 (SUTURE) ×2 IMPLANT
SUT MON AB 2-0 CT1 36 (SUTURE) IMPLANT
SUT PDS AB 0 CTX 36 PDP370T (SUTURE) IMPLANT
SUT PDS AB 1 CT1 27 (SUTURE) IMPLANT
SUT VIC AB 0 CT1 18XCR BRD 8 (SUTURE) ×2 IMPLANT
SUT VIC AB 0 CT1 27XBRD ANBCTR (SUTURE) IMPLANT
SUT VIC AB 0 CT1 36 (SUTURE) ×2 IMPLANT
SUT VIC AB 1 CT1 27XBRD ANBCTR (SUTURE) IMPLANT
SUT VIC AB 2-0 SH 27XBRD (SUTURE) IMPLANT
SUT VICRYL 3-0 CR8 SH (SUTURE) ×2 IMPLANT
SUTURE FIBERWR #2 38 T-5 BLUE (SUTURE) IMPLANT
SYR BULB EAR ULCER 3OZ GRN STR (SYRINGE) ×2 IMPLANT
TAPE STRIPS DRAPE STRL (GAUZE/BANDAGES/DRESSINGS) ×2 IMPLANT
TOWEL GREEN STERILE FF (TOWEL DISPOSABLE) ×2 IMPLANT
TOWEL OR 17X26 10 PK STRL BLUE (TOWEL DISPOSABLE) IMPLANT
YANKAUER SUCT BULB TIP NO VENT (SUCTIONS) IMPLANT

## 2024-02-18 NOTE — Plan of Care (Signed)
   Problem: Activity: Goal: Risk for activity intolerance will decrease Outcome: Progressing   Problem: Nutrition: Goal: Adequate nutrition will be maintained Outcome: Progressing   Problem: Pain Managment: Goal: General experience of comfort will improve and/or be controlled Outcome: Progressing   Problem: Safety: Goal: Ability to remain free from injury will improve Outcome: Progressing

## 2024-02-18 NOTE — Transfer of Care (Signed)
 Immediate Anesthesia Transfer of Care Note  Patient: Jonathon Anderson  Procedure(s) Performed: IRRIGATION AND DEBRIDEMENT WOUND (Left: Hip) APPLICATION, WOUND VAC (Left: Hip) CLOSED REDUCTION, HIP (Hip)  Patient Location: PACU  Anesthesia Type:General  Level of Consciousness: awake, alert , oriented, and patient cooperative  Airway & Oxygen Therapy: Patient Spontanous Breathing and Patient connected to face mask oxygen  Post-op Assessment: Report given to RN and Post -op Vital signs reviewed and stable  Post vital signs: Reviewed and stable  Last Vitals:  Vitals Value Taken Time  BP 108/58 02/18/24 14:12  Temp    Pulse 91 02/18/24 14:13  Resp 19 02/18/24 14:14  SpO2 92 % 02/18/24 14:13  Vitals shown include unfiled device data.  Last Pain:  Vitals:   02/18/24 1052  TempSrc: Oral  PainSc:          Complications: No notable events documented.

## 2024-02-18 NOTE — Progress Notes (Signed)
 Orthopedic Tech Progress Note Patient Details:  Jonathon Anderson October 12, 1955 [id number]  Patient ID: Jonathon Anderson, male   DOB: Jan 13, 1956, 68 y.o.   MRN: [id number] Hip abduction brace ordered [facility]. Jonathon Anderson 02/18/2024, 4:36 PM

## 2024-02-18 NOTE — Plan of Care (Signed)

## 2024-02-18 NOTE — ED Triage Notes (Signed)
 Pt bib ems from home. Pt is c/o hip pain x32month. Had hip procedure 1 month ago, has a repeat procedure scheduled for noon today at [facility].

## 2024-02-18 NOTE — Progress Notes (Signed)
 [facility] transport set up,  truck is on the way to pick up patient

## 2024-02-18 NOTE — ED Provider Notes (Signed)
 [place] EMERGENCY DEPARTMENT AT [facility] Provider Note   CSN: [id number] Arrival date & time: 02/18/24  9097     Patient presents with: Hip Pain   Jonathon Anderson is a 68 y.o. male.   This is a 68 year old male presenting emergency department by ambulance.  He is called for transport to [facility] for surgery that he has scheduled today with Dr. Edna.  He told EMS that he needs to go to [facility], but they stated that they do not transport to [facility], only to [facility].  No fevers no chills, does have some pain to the hip, but unchanged from prior.  He states he was simply trying to get to his surgery appointment.   Hip Pain       Prior to Admission medications   Medication Sig Start Date End Date Taking? Authorizing Provider  amLODipine  (NORVASC ) 10 MG tablet Take 10 mg by mouth in the morning. 10/23/23  Yes [provider]  aspirin  EC 81 MG tablet Take 1 tablet (81 mg total) by mouth 2 (two) times daily for 28 days. Swallow whole. 02/19/24 03/18/24 Yes Renae Bernarda HERO, PA-C  celecoxib  (CELEBREX ) 100 MG capsule Take 1 capsule (100 mg total) by mouth 2 (two) times daily for 14 days. 02/18/24 03/03/24 Yes Renae Bernarda HERO, PA-C  metoprolol  tartrate (LOPRESSOR ) 100 MG tablet Take 100 mg by mouth 2 (two) times daily.   Yes [provider]  oxyCODONE  (ROXICODONE ) 5 MG immediate release tablet Take 1 tablet (5 mg total) by mouth every 4 (four) hours as needed for up to 7 days for severe pain (pain score 7-10) or moderate pain (pain score 4-6). 02/18/24 02/25/24 Yes Renae Bernarda HERO, PA-C  polyethylene glycol (MIRALAX ) 17 g packet Take 17 g by mouth daily. 02/18/24  Yes Cockerham, Alicia M, PA-C  sulfamethoxazole -trimethoprim  (BACTRIM  DS) 800-160 MG tablet Take 1 tablet by mouth 2 (two) times daily for 14 days. 02/18/24 03/03/24 Yes Cockerham, Alicia M, PA-C  acetaminophen  (TYLENOL ) 500 MG tablet Take 1,000 mg by mouth in the morning  and at bedtime.    [provider]  atorvastatin  (LIPITOR) 20 MG tablet Take 20 mg by mouth at bedtime. 11/28/20   [provider]  cyclobenzaprine  (FLEXERIL ) 10 MG tablet Take 1 tablet (10 mg total) by mouth every 12 (twelve) hours as needed for muscle spasms. 02/18/24   Renae Bernarda HERO, PA-C  ferrous sulfate  325 (65 FE) MG tablet Take 1 tablet (325 mg total) by mouth daily with breakfast. Patient not taking: Reported on 02/13/2024 01/24/24   Elnora Ip, MD  folic acid  (FOLVITE ) 1 MG tablet Take 1 tablet (1 mg total) by mouth daily. Patient not taking: Reported on 02/13/2024 01/24/24 02/23/24  Elnora Ip, MD  magnesium  hydroxide (MILK OF MAGNESIA) 400 MG/5ML suspension Take 15 mLs by mouth daily as needed (constipation.).    [provider]  Menthol , Topical Analgesic, (BIOFREEZE EX) Apply 1 Application topically daily as needed (pain).    [provider]  ondansetron  (ZOFRAN ) 4 MG tablet Take 1 tablet (4 mg total) by mouth every 6 (six) hours as needed for nausea. 01/24/24   Elnora Ip, MD    Allergies: Patient has no known allergies.    Review of Systems  Updated Vital Signs BP 103/65 (BP Location: Left Arm)   Pulse 94   Temp (!) 97.5 F (36.4 C) (Oral)   Resp 18   Ht 5' 7 (1.702 m)  Wt 101 kg   SpO2 100%   BMI 34.87 kg/m   Physical Exam Vitals and nursing note reviewed.  Constitutional:      General: He is not in acute distress.    Appearance: He is not toxic-appearing.  HENT:     Head: Normocephalic.     Nose: Nose normal.  Eyes:     Conjunctiva/sclera: Conjunctivae normal.  Cardiovascular:     Rate and Rhythm: Normal rate and regular rhythm.  Pulmonary:     Effort: Pulmonary effort is normal.  Abdominal:     General: Abdomen is flat. There is no distension.     Palpations: Abdomen is soft.     Tenderness: There is no abdominal tenderness. There is no guarding or rebound.  Musculoskeletal:         General: Normal range of motion.     Comments: Left hip with staples.  At the top of the wound appears that there are some missing staples/dehiscence. No erythema or crepitus.   Skin:    General: Skin is warm and dry.     Capillary Refill: Capillary refill takes less than 2 seconds.  Neurological:     Mental Status: He is alert and oriented to person, place, and time.  Psychiatric:        Mood and Affect: Mood normal.        Behavior: Behavior normal.     (all labs ordered are listed, but only abnormal results are displayed) Labs Reviewed  CBC WITH DIFFERENTIAL/PLATELET - Abnormal; Notable for the following components:      Result Value   RBC 3.48 (*)    Hemoglobin 9.5 (*)    HCT 31.4 (*)    RDW 16.3 (*)    Platelets 558 (*)    Neutro Abs 9.1 (*)    All other components within normal limits  COMPREHENSIVE METABOLIC PANEL WITH GFR - Abnormal; Notable for the following components:   Chloride 95 (*)    Calcium  8.2 (*)    Albumin  2.1 (*)    AST 13 (*)    Alkaline Phosphatase 131 (*)    All other components within normal limits  AEROBIC/ANAEROBIC CULTURE W GRAM STAIN (SURGICAL/DEEP WOUND)  AEROBIC/ANAEROBIC CULTURE W GRAM STAIN (SURGICAL/DEEP WOUND)  TYPE AND SCREEN    EKG: None  Radiology: DG Pelvis Portable Result Date: 02/18/2024 CLINICAL DATA:  Left hip instability preoperative EXAM: PORTABLE PELVIS 1-2 VIEWS COMPARISON:  January 18, 2024 FINDINGS: Status post left hip total arthroplasty with superolateral dislocation of the left femoral component surgical clips superimposing the right femoral trochanter. No acute osseous fracture. Soft tissues are unremarkable. He she high, Similar to prior radiograph. IMPRESSION: Superolateral dislocation of left hip arthroplasty femoral component. Electronically Signed   By: Megan  Zare M.D.   On: 02/18/2024 14:53   DG C-Arm 1-60 Min-No Report Result Date: 02/18/2024 Fluoroscopy was utilized by the requesting physician.  No radiographic  interpretation.   DG C-Arm 1-60 Min-No Report Result Date: 02/18/2024 Fluoroscopy was utilized by the requesting physician.  No radiographic interpretation.     Procedures   Medications Ordered in the ED  aspirin  chewable tablet 81 mg (has no administration in time range)  lactated ringers  infusion (has no administration in time range)  ceFAZolin  (ANCEF ) IVPB 2g/100 mL premix (has no administration in time range)  acetaminophen  (TYLENOL ) tablet 1,000 mg (has no administration in time range)  acetaminophen  (TYLENOL ) tablet 325-650 mg (has no administration in time range)  HYDROmorphone  (DILAUDID ) injection 0.5-1  mg (has no administration in time range)  methocarbamol  (ROBAXIN ) tablet 500 mg ( Oral See Alternative 02/18/24 1521)    Or  methocarbamol  (ROBAXIN ) injection 500 mg (500 mg Intravenous Given 02/18/24 1521)  diphenhydrAMINE  (BENADRYL ) 12.5 MG/5ML elixir 12.5-25 mg (has no administration in time range)  docusate sodium  (COLACE) capsule 100 mg (has no administration in time range)  senna (SENOKOT) tablet 8.6 mg (has no administration in time range)  polyethylene glycol (MIRALAX  / GLYCOLAX ) packet 17 g (has no administration in time range)  ondansetron  (ZOFRAN ) tablet 4 mg (has no administration in time range)    Or  ondansetron  (ZOFRAN ) injection 4 mg (has no administration in time range)  pantoprazole  (PROTONIX ) EC tablet 40 mg (has no administration in time range)  menthol -cetylpyridinium (CEPACOL) lozenge 3 mg (has no administration in time range)    Or  phenol (CHLORASEPTIC) mouth spray 1 spray (has no administration in time range)  ketorolac  (TORADOL ) 15 MG/ML injection 7.5 mg (has no administration in time range)  oxyCODONE  (Oxy IR/ROXICODONE ) immediate release tablet 5-10 mg (has no administration in time range)  zolpidem  (AMBIEN ) tablet 5 mg (has no administration in time range)  amLODipine  (NORVASC ) tablet 10 mg (has no administration in time range)  atorvastatin   (LIPITOR) tablet 20 mg (has no administration in time range)  ferrous sulfate  tablet 325 mg (has no administration in time range)  folic acid  (FOLVITE ) tablet 1 mg (has no administration in time range)  metoprolol  tartrate (LOPRESSOR ) tablet 100 mg (has no administration in time range)  ceFAZolin  (ANCEF ) IVPB 2g/100 mL premix (2 g Intravenous Given 02/18/24 1203)  acetaminophen  (TYLENOL ) tablet 1,000 mg (1,000 mg Oral Given 02/18/24 1057)  dexamethasone  (DECADRON ) injection 4 mg (8 mg Intravenous Given 02/18/24 1300)  tranexamic acid  (CYKLOKAPRON ) IVPB 1,000 mg (1,000 mg Intravenous Given 02/18/24 1236)  chlorhexidine  (PERIDEX ) 0.12 % solution 15 mL (15 mLs Mouth/Throat Given 02/18/24 1103)    Or  Oral care mouth rinse ( Mouth Rinse See Alternative 02/18/24 1103)                                    Medical Decision Making This is a 68 year old male presenting emergency department for hip surgery.  Unfortunately he was transported to the wrong hospital by EMS.  He is afebrile, vital signs reassuring.  He has no new specific complaints.  Wound does not appear to be overtly infected.  He is strong pulses in his extremities with normal sensation.  Attempting to get patient transferred over to [facility] so he can have his surgery.  Update; did speak with Beverley Economy on-call Dr. Verlon who will let Dr. Edna know patient is to be expected. Called [facility] or who will notify short stay. Nurses calling [facility] now for transport.   Amount and/or Complexity of Data Reviewed Independent Historian:     Details: Family member arrived to the ED, however does not feel comfortable taking patient to the OR. External Data Reviewed:     Details: Appears that he is due for wound revision surgery        Final diagnoses:  Left hip pain    ED Discharge Orders          Ordered    aspirin  EC 81 MG tablet  2 times daily        02/18/24 1014    cyclobenzaprine  (FLEXERIL ) 10 MG tablet  Every 12 hours PRN  02/18/24 1013    polyethylene glycol (MIRALAX ) 17 g packet  Daily        02/18/24 1014    oxyCODONE  (ROXICODONE ) 5 MG immediate release tablet  Every 4 hours PRN        02/18/24 1014    sulfamethoxazole -trimethoprim  (BACTRIM  DS) 800-160 MG tablet  2 times daily        02/18/24 1014    celecoxib  (CELEBREX ) 100 MG capsule  2 times daily        02/18/24 1014               Neysa Caron PARAS, [place] 02/18/24 1638

## 2024-02-18 NOTE — Anesthesia Postprocedure Evaluation (Signed)
 Anesthesia Post Note  Patient: CARTRELL BENTSEN  Procedure(s) Performed: IRRIGATION AND DEBRIDEMENT WOUND (Left: Hip) APPLICATION, WOUND VAC (Left: Hip) CLOSED REDUCTION, HIP (Hip)     Patient location during evaluation: PACU Anesthesia Type: General Level of consciousness: awake Pain management: pain level controlled Vital Signs Assessment: post-procedure vital signs reviewed and stable Respiratory status: spontaneous breathing, nonlabored ventilation and respiratory function stable Cardiovascular status: blood pressure returned to baseline and stable Postop Assessment: no apparent nausea or vomiting Anesthetic complications: no   No notable events documented.  Last Vitals:  Vitals:   02/18/24 1600 02/18/24 1631  BP: (!) 120/59 103/65  Pulse: 90 94  Resp: 18 18  Temp: 36.4 C (!) 36.4 C  SpO2: 100% 100%    Last Pain:  Vitals:   02/18/24 2000  TempSrc:   PainSc: 7                  Delon Aisha Arch

## 2024-02-18 NOTE — Anesthesia Procedure Notes (Signed)
 Procedure Name: Intubation Date/Time: 02/18/2024 12:13 PM  Performed by: Nada Corean CROME, CRNAPre-anesthesia Checklist: Suction available, Emergency Drugs available, Patient being monitored and Timeout performed Patient Re-evaluated:Patient Re-evaluated prior to induction Oxygen Delivery Method: Circle system utilized Preoxygenation: Pre-oxygenation with 100% oxygen Induction Type: IV induction Ventilation: Mask ventilation without difficulty Laryngoscope Size: Mac and 4 Grade View: Grade I Tube type: Oral Tube size: 7.5 mm Number of attempts: 1 Airway Equipment and Method: Stylet Placement Confirmation: ETT inserted through vocal cords under direct vision, positive ETCO2 and breath sounds checked- equal and bilateral Secured at: 22 cm Dental Injury: Teeth and Oropharynx as per pre-operative assessment

## 2024-02-18 NOTE — Discharge Instructions (Signed)
 Orthopedic Discharge Instructions  Diet: As you were doing prior to hospitalization   Shower:  May shower but keep the wounds dry, use an occlusive plastic wrap, NO SOAKING IN TUB.    Dressing: Keep the surgical dressing until follow up.  This has battery powered suction to be kept at 125 mmHg and lasts 7 days.  If the battery stops sooner than 7 days or if the bandage begins to leak, then call our office for further instructions.    The dressing is also water  resistant, however it is best to shower with an extra covering, such as saran wrap to keep it dry.  IF THE DRESSING FALLS OFF or the wound gets wet inside, change the dressing with sterile gauze and call our office for further instruction.  Please use good hand washing techniques before changing the dressing.  Do not use any lotions or creams on the incision until instructed by your surgeon.    Activity:  Increase activity slowly as tolerated, but follow the weight bearing instructions below.  Continue with total hip precautions for the left leg.  Do not drive for the next 4 weeks.  In addition, you cannot be taking narcotics while you drive, and you must feel in control of the vehicle.    Weight Bearing:   You may bear weight on your surgical leg as tolerated.    Blood clot prevention (DVT Prophylaxis): After surgery you are at an increased risk for a blood clot. you were prescribed a blood thinner, Aspirin  81 mg, to be taken twice daily for a total of 4 weeks from surgery to help reduce your risk of getting a blood clot. Signs of a pulmonary embolus (blood clot in the lungs) include sudden short of breath, feeling lightheaded or dizzy, chest pain with a deep breath, rapid pulse rapid breathing.  Signs of a blood clot in your arms or legs include new unexplained swelling and cramping, warm, red or darkened skin around the painful area.  Please call the office or 911 right away if these signs or symptoms develop.  To prevent constipation: you  may use a stool softener such as - Colace (over the counter) 100 mg by mouth twice a day  Drink plenty of fluids (prune juice may be helpful) and high fiber foods Miralax  (over the counter) for constipation as needed.    Itching:  If you experience itching with your medications, try taking only a single pain pill, or even half a pain pill at a time.  You may take up to 10 pain pills per day, and you can also use benadryl  over the counter for itching or also to help with sleep.   Precautions:  If you experience chest pain or shortness of breath - call 911 immediately for transfer to the hospital emergency department!!  Call office [phone]) for the following: Temperature greater than 101F Persistent nausea and vomiting Severe uncontrolled pain Redness, tenderness, or signs of infection (pain, swelling, redness, odor or green/yellow discharge around the site) Difficulty breathing, headache or visual disturbances Hives Persistent dizziness or light-headedness Extreme fatigue Any other questions or concerns you may have after discharge  In an emergency, call 911 or go to an Emergency Department at a nearby hospital  Follow- Up Appointment:  Please call for an appointment to be seen approximately 2-3 week after surgery in [facility] with your surgeon Dr. Toribio Higashi - [phone] Address: [address], [place], [place] [address]

## 2024-02-18 NOTE — Op Note (Signed)
 02/18/2024  1:29 PM  PATIENT:  Jonathon Anderson    PRE-OPERATIVE DIAGNOSIS: Left hip wound dehiscence, close dislocation left hip arthroplasty  POST-OPERATIVE DIAGNOSIS:  Same  PROCEDURE: Closed reduction left total hip arthroplasty, left hip wound debridement, Application of Kerecis graft, application of incisional wound VAC  SURGEON:  Rekisha Welling A Rilynn Habel, MD  PHYSICIAN ASSISTANT: Jon Hurst, RNFA, present and scrubbed throughout the case, critical for completion in a timely fashion, and for retraction, instrumentation, and closure.  ANESTHESIA:   General  PREOPERATIVE INDICATIONS:  Jonathon Anderson is a  68 y.o. male who had undergone open reduction of a left proximal femoral replacement back on 01/21/2024.  Postoperatively he developed superficial drainage from the proximal incision despite wound VAC application continued to have some malodorous output, given persistent drainage elected for irrigation debridement.  Also yesterday while at home he misstepped going down the stairs and developed pain in his left hip.  X-rays in preop area unfortunately demonstrated a dislocation of his left hip prosthesis.  Recommended for the patient to go closed versus open reduction.  The risks benefits and alternatives were discussed with the patient preoperatively including but not limited to the risks of infection, bleeding, nerve injury, cardiopulmonary complications, the need for revision surgery, among others, and the patient was willing to proceed.  ESTIMATED BLOOD LOSS: 100cc  OPERATIVE FINDINGS: Successful closed reduction of the left hip prosthesis.  Superficial wound dehiscence with healthy appearing deep tissue and fascia.    OPERATIVE PROCEDURE:  The patient was brought to the operating room.  General anesthesia was induced.  2 g of Ancef  1 g of TXA were given.  The patient was positioned supine on the operating room table.  Fluoroscopy confirmed that the left hip was dislocated.   Timeout was performed.  While flexing the hip internally rotating and abducting pressure was applied the leg was brought into extension externally rotated.  The hip was able to be successfully closed reduced after 3 attempts.  Fluoroscopy confirmed appropriate reduction in 2 views.  Next we turned our attention to irrigation debridement of the left hip wound.  The patient was positioned in the lateral with the pegboard table.  The left lower extremity was prepped and draped in a sterile fashion.  Second timeout was performed.  The dehisced area was excised with about a centimeter border of healthy tissue on each side.  The incision was also extended about an inch proximally and distally.  Following excision of this full-thickness skin defect area the remaining tissue appeared healthy.  Fascia was observed and found to be intact and benign appearing.  The tissue was well-vascularized as well.  We irrigated the left hip with diluted 3% hydrogen peroxide  followed by 3 L of normal saline as well as Irrisept fluid irrigation.  We then introduced 1 g of Vanco powder with Kerecis graft into the wound.  This was applied over the fascia as well as the subcutaneous fat.  The wound was then closed in a multilayered fashion with #1 and 0 PDS to reapproximate the deep fat layer.  Skin was closed with 2-0 Monocryl and 3-0 nylon.  Prevena incisional wound VAC was applied.  Patient was awoken in stable condition and taken to PACU.  Leg lengths were clinically equal in the supine position.  The patient tolerated the procedure well.    Debridement type: Excisional Debridement   Side: Left   Body Location: hip   Tools used for debridement: scalpel, scissors, cobb, currette, and rongeur  Pre-debridement Wound size (cm):   Length: 18        Width: 2     Depth: 3    Post-debridement Wound size (cm):   Length: 18        Width: 2    Depth: 3   Debridement depth beyond dead/damaged tissue down to healthy viable tissue:  yes   Tissue layer involved: skin, subcutaneous tissue   Nature of tissue removed: Devitalized Tissue   Irrigation volume: 3 L      Irrigation fluid type: Normal Saline, hydrogen peroxide  3% diluted, and Irrisept  Post op recs: WB: WBAT LLE with strict posterior hip precautions Abx: ancef  x23 hours post op, dc on bactrim  Imaging: PACU xrays Dressing: Prevena incisional wound VAC DVT prophylaxis: Aspirin  81 mg Follow up: 1 week after surgery for a wound check with Dr. Edna at [facility].  Address: [address], [place], [place] [address]  Office Phone: [phone]   Toribio Edna, MD Orthopaedic Surgery

## 2024-02-18 NOTE — Progress Notes (Signed)
 PT Cancellation Note  Patient Details Name: Jonathon Anderson MRN: [id number] DOB: 1956/01/24   Cancelled Treatment:    Reason Eval/Treat Not Completed: Other (comment). Nursing indicates pt is not appropriate for therapy at this time due to elevated pain. Orders for hip abduction brace and not in room at this time, to be delivered from [facility]. PT to continue to follow acutely.  [place], PT Acute Rehab   [place] VEAR Drone 02/18/2024, 6:22 PM

## 2024-02-18 NOTE — Discharge Instructions (Addendum)
 Orthopedic Discharge Instructions  Diet: As you were doing prior to hospitalization   Shower/Dressing:  Keep the surgical dressing until follow up.  This has battery powered suction to be kept at 125 mmHg and lasts 7 days.  If the battery stops sooner than 7 days, then call our office for further instructions.    The dressing is also water  resistant, however it is best to shower with an extra covering, such as saran wrap to keep it dry.  IF THE DRESSING FALLS OFF or the wound gets wet inside, change the dressing with sterile gauze and call our office for further instruction.  Please use good hand washing techniques before changing the dressing.  Do not use any lotions or creams on the incision until instructed by your surgeon.  For most surgeries, the stitches used are dissolvable and don't need to be removed.  However, depending on your surgery, you may have stitches that will need to be removed in the office in about 2 weeks.    Activity:  Increase activity slowly as tolerated, but follow the weight bearing instructions below.  Do not drive for the next 2-4 weeks.  In addition, you cannot be taking narcotics while you drive, and you must feel in control of the vehicle.    Weight Bearing:   Bear weight on your surgical leg as tolerated.    Blood clot prevention (DVT Prophylaxis): After surgery you are at an increased risk for a blood clot. you were prescribed a blood thinner, Aspirin  81mg , to be taken twice daily for a total of 4 weeks from surgery to help reduce your risk of getting a blood clot. Signs of a pulmonary embolus (blood clot in the lungs) include sudden short of breath, feeling lightheaded or dizzy, chest pain with a deep breath, rapid pulse rapid breathing.  Signs of a blood clot in your arms or legs include new unexplained swelling and cramping, warm, red or darkened skin around the painful area.  Please call the office or 911 right away if these signs or symptoms develop.  To prevent  constipation: you may use a stool softener such as - Colace (over the counter) 100 mg by mouth twice a day  Drink plenty of fluids (prune juice may be helpful) and high fiber foods Miralax  (over the counter) for constipation as needed.    Itching:  If you experience itching with your medications, try taking only a single pain pill, or even half a pain pill at a time.  You may take up to 10 pain pills per day, and you can also use benadryl  over the counter for itching or also to help with sleep.   Precautions:  If you experience chest pain or shortness of breath - call 911 immediately for transfer to the hospital emergency department!!  Call office [phone]) for the following: Temperature greater than 101F Persistent nausea and vomiting Severe uncontrolled pain Redness, tenderness, or signs of infection (pain, swelling, redness, odor or green/yellow discharge around the site) Difficulty breathing, headache or visual disturbances Hives Persistent dizziness or light-headedness Extreme fatigue Any other questions or concerns you may have after discharge  In an emergency, call 911 or go to an Emergency Department at a nearby hospital  Follow- Up Appointment:  Please call for an appointment to be seen approximately 2-3 week after surgery in [facility] with your surgeon Dr. Toribio Higashi - [phone] Address: [address], [place], [place] [address]

## 2024-02-18 NOTE — Progress Notes (Signed)
 Orthopedic Tech Progress Note Patient Details:  Jonathon Anderson 04/19/1956 [id number]  Ortho Devices Type of Ortho Device: Abduction pillow Ortho Device/Splint Interventions: Ordered     Hip pillow given to OR front desk. [place] A Dryden Tapley 02/18/2024, 2:10 PM

## 2024-02-18 NOTE — Interval H&P Note (Signed)
 The patient has been re-examined, and the chart reviewed, and there have been no interval changes to the documented history and physical.    Plan for Left hip I&D possible head liner exchange, open versus closed reduction  The operative side was examined and the patient was confirmed to have sensation to DPN, SPN, TN intact, Motor EHL, ext, flex 5/5, and DP 2+, PT 2+, No significant edema. Incision with mild central dehiscence, serosang drainage   The risks, benefits, and alternatives have been discussed at length with patient, and the patient is willing to proceed.  Left hip marked. Consent has been signed.

## 2024-02-19 ENCOUNTER — Encounter (HOSPITAL_COMMUNITY): Payer: Self-pay | Admitting: Orthopedic Surgery

## 2024-02-19 DIAGNOSIS — Z8673 Personal history of transient ischemic attack (TIA), and cerebral infarction without residual deficits: Secondary | ICD-10-CM | POA: Diagnosis not present

## 2024-02-19 DIAGNOSIS — D649 Anemia, unspecified: Secondary | ICD-10-CM | POA: Diagnosis not present

## 2024-02-19 DIAGNOSIS — S73005D Unspecified dislocation of left hip, subsequent encounter: Secondary | ICD-10-CM | POA: Diagnosis not present

## 2024-02-19 DIAGNOSIS — J9601 Acute respiratory failure with hypoxia: Secondary | ICD-10-CM | POA: Diagnosis not present

## 2024-02-19 DIAGNOSIS — R1113 Vomiting of fecal matter: Secondary | ICD-10-CM | POA: Diagnosis not present

## 2024-02-19 DIAGNOSIS — E871 Hypo-osmolality and hyponatremia: Secondary | ICD-10-CM | POA: Diagnosis not present

## 2024-02-19 DIAGNOSIS — R5381 Other malaise: Secondary | ICD-10-CM | POA: Diagnosis not present

## 2024-02-19 DIAGNOSIS — M24452 Recurrent dislocation, left hip: Secondary | ICD-10-CM | POA: Diagnosis not present

## 2024-02-19 DIAGNOSIS — R110 Nausea: Secondary | ICD-10-CM | POA: Diagnosis not present

## 2024-02-19 DIAGNOSIS — T84021A Dislocation of internal left hip prosthesis, initial encounter: Secondary | ICD-10-CM | POA: Diagnosis present

## 2024-02-19 DIAGNOSIS — D6859 Other primary thrombophilia: Secondary | ICD-10-CM | POA: Diagnosis present

## 2024-02-19 DIAGNOSIS — K7689 Other specified diseases of liver: Secondary | ICD-10-CM | POA: Diagnosis present

## 2024-02-19 DIAGNOSIS — D1803 Hemangioma of intra-abdominal structures: Secondary | ICD-10-CM | POA: Diagnosis present

## 2024-02-19 DIAGNOSIS — M25552 Pain in left hip: Secondary | ICD-10-CM | POA: Diagnosis present

## 2024-02-19 DIAGNOSIS — T8131XA Disruption of external operation (surgical) wound, not elsewhere classified, initial encounter: Secondary | ICD-10-CM | POA: Diagnosis present

## 2024-02-19 DIAGNOSIS — Z87891 Personal history of nicotine dependence: Secondary | ICD-10-CM | POA: Diagnosis not present

## 2024-02-19 DIAGNOSIS — Y792 Prosthetic and other implants, materials and accessory orthopedic devices associated with adverse incidents: Secondary | ICD-10-CM | POA: Diagnosis present

## 2024-02-19 DIAGNOSIS — R29898 Other symptoms and signs involving the musculoskeletal system: Secondary | ICD-10-CM | POA: Diagnosis not present

## 2024-02-19 DIAGNOSIS — Z7901 Long term (current) use of anticoagulants: Secondary | ICD-10-CM | POA: Diagnosis not present

## 2024-02-19 DIAGNOSIS — K5901 Slow transit constipation: Secondary | ICD-10-CM | POA: Diagnosis not present

## 2024-02-19 DIAGNOSIS — I10 Essential (primary) hypertension: Secondary | ICD-10-CM | POA: Diagnosis present

## 2024-02-19 DIAGNOSIS — D62 Acute posthemorrhagic anemia: Secondary | ICD-10-CM | POA: Diagnosis not present

## 2024-02-19 DIAGNOSIS — E669 Obesity, unspecified: Secondary | ICD-10-CM | POA: Diagnosis present

## 2024-02-19 DIAGNOSIS — K5900 Constipation, unspecified: Secondary | ICD-10-CM | POA: Diagnosis present

## 2024-02-19 DIAGNOSIS — Z6834 Body mass index (BMI) 34.0-34.9, adult: Secondary | ICD-10-CM | POA: Diagnosis not present

## 2024-02-19 DIAGNOSIS — Z79899 Other long term (current) drug therapy: Secondary | ICD-10-CM | POA: Diagnosis not present

## 2024-02-19 DIAGNOSIS — Z8249 Family history of ischemic heart disease and other diseases of the circulatory system: Secondary | ICD-10-CM | POA: Diagnosis not present

## 2024-02-19 DIAGNOSIS — E782 Mixed hyperlipidemia: Secondary | ICD-10-CM | POA: Diagnosis present

## 2024-02-19 LAB — BASIC METABOLIC PANEL WITH GFR
Anion gap: 11 (ref 5–15)
BUN: 11 mg/dL (ref 8–23)
CO2: 31 mmol/L (ref 22–32)
Calcium: 7.7 mg/dL — ABNORMAL LOW (ref 8.9–10.3)
Chloride: 92 mmol/L — ABNORMAL LOW (ref 98–111)
Creatinine, Ser: 0.87 mg/dL (ref 0.61–1.24)
GFR, Estimated: >60 mL/min (ref >60)
Glucose, Bld: 134 mg/dL — ABNORMAL HIGH (ref 70–99)
Potassium: 4.9 mmol/L (ref 3.5–5.1)
Sodium: 134 mmol/L — ABNORMAL LOW (ref 135–145)

## 2024-02-19 LAB — CBC
HCT: 22.6 % — ABNORMAL LOW (ref 39.0–52.0)
Hemoglobin: 6.9 g/dL — CL (ref 13.0–17.0)
MCH: 27.4 pg (ref 26.0–34.0)
MCHC: 30.5 g/dL (ref 30.0–36.0)
MCV: 89.7 fL (ref 80.0–100.0)
Platelets: 478 K/uL — ABNORMAL HIGH (ref 150–400)
RBC: 2.52 MIL/uL — ABNORMAL LOW (ref 4.22–5.81)
RDW: 16.2 % — ABNORMAL HIGH (ref 11.5–15.5)
WBC: 10.4 K/uL (ref 4.0–10.5)
nRBC: 0 % (ref 0.0–0.2)

## 2024-02-19 MED ORDER — CALCIUM CARBONATE ANTACID 500 MG PO CHEW
1.0000 | CHEWABLE_TABLET | Freq: Two times a day (BID) | ORAL | Status: DC
  Administered 2024-02-19 – 2024-02-26 (×13): 200 mg via ORAL
  Filled 2024-02-19 (×16): qty 1

## 2024-02-19 MED ORDER — ENSURE PLUS HIGH PROTEIN PO LIQD
237.0000 mL | Freq: Two times a day (BID) | ORAL | Status: DC
  Administered 2024-02-19: 237 mL via ORAL

## 2024-02-19 MED ORDER — CEFAZOLIN SODIUM-DEXTROSE 2-4 GM/100ML-% IV SOLN
2.0000 g | Freq: Three times a day (TID) | INTRAVENOUS | Status: AC
  Administered 2024-02-19 – 2024-02-21 (×9): 2 g via INTRAVENOUS
  Filled 2024-02-19 (×10): qty 100

## 2024-02-19 NOTE — Plan of Care (Signed)
  Problem: Education: Goal: Knowledge of General Education information will improve Description: Including pain rating scale, medication(s)/side effects and non-pharmacologic comfort measures Outcome: Progressing   Problem: Coping: Goal: Level of anxiety will decrease Outcome: Progressing   Problem: Pain Managment: Goal: General experience of comfort will improve and/or be controlled Outcome: Progressing   Problem: Safety: Goal: Ability to remain free from injury will improve Outcome: Progressing   Problem: Pain Management: Goal: Pain level will decrease with appropriate interventions Outcome: Progressing

## 2024-02-19 NOTE — Progress Notes (Signed)
 PT Cancellation Note  Patient Details Name: Jonathon Anderson MRN: [id number] DOB: 1956/07/08   Cancelled Treatment:    Reason Eval/Treat Not Completed: Other (comment). New hip abduction brace has not yet arrived and pt with Hgb 6.9 today. Will continue efforts to mobilize once brace arrives.   [facility] 02/19/2024, 1:51 PM

## 2024-02-19 NOTE — Progress Notes (Signed)
 Date and time results received: 02/19/24 06:55am  Test: Hgb Critical Value: 6.9  Name of Provider Notified: Dr. Edna  Orders Received? Or Actions Taken?: no new orders, continue to monitor

## 2024-02-19 NOTE — TOC Transition Note (Signed)
 Transition of Care [facility]) - Discharge Note   Patient Details  Name: Jonathon Anderson MRN: [id number] Date of Birth: [birth year]  Transition of Care [facility]) CM/SW Contact:  NORMAN ASPEN, LCSW Phone Number: 02/19/2024, 3:21 PM   Clinical Narrative:     Met with pt today who confirms he has needed DME in the home.  He was already received HHPT services via [facility] and would like to continue.  Have confirmed with [facility] they will continue services and new orders to be sent.  Pt notes he needs sister to bring his brace to the hospital but has had trouble using hospital phone.  I have called sister, Elveria Ruth and left VM that brace needed.  No further TOC needs.  Final next level of care: Home w Home Health Services Barriers to Discharge: No Barriers Identified   Patient Goals and CMS Choice Patient states their goals for this hospitalization and ongoing recovery are:: return home          Discharge Placement                       Discharge Plan and Services Additional resources added to the After Visit Summary for                  DME Arranged: N/A DME Agency: NA       HH Arranged: PT HH Agency: [facility] Date HH Agency Contacted: 02/19/24   Representative spoke with at [facility] Agency: Garry  Social Drivers of Health (SDOH) Interventions SDOH Screenings   Food Insecurity: No Food Insecurity (02/18/2024)  Housing: Low Risk  (02/18/2024)  Transportation Needs: No Transportation Needs (02/18/2024)  Utilities: Not At Risk (02/18/2024)  Social Connections: Moderately Integrated (02/18/2024)  Tobacco Use: Medium Risk (02/18/2024)     Readmission Risk Interventions    02/19/2024    3:17 PM  Readmission Risk Prevention Plan  Post Dischage Appt Complete  Medication Screening Complete  Transportation Screening Complete

## 2024-02-19 NOTE — Progress Notes (Signed)
 Date and time results received: 02/19/24 06:55am   Test: Hgb Critical Value: 6.9  Name of Provider Notified: Dr. Edna  Orders Received? Or Actions Taken?: No new orders, recheck in the AM, transfuse if needed.

## 2024-02-19 NOTE — Progress Notes (Signed)
     Subjective:  Patient reports pain as mild.  Denies distal numbness and tingling.  In bed with hip abduction pillow in place.  Reports that previous hip abduction brace became soiled from drainage from the incisional wound and does not have it anymore.  Will see if we can get a new brace from the vendor today.  Objective:   VITALS:   Vitals:   02/18/24 1631 02/18/24 2059 02/19/24 0148 02/19/24 0609  BP: 103/65 113/67 (!) 128/112 128/71  Pulse: 94 (!) 106 79 88  Resp: 18 16 16 16   Temp: (!) 97.5 F (36.4 C) 98.5 F (36.9 C) 98.1 F (36.7 C) 98.2 F (36.8 C)  TempSrc: Oral Oral Oral Oral  SpO2: 100% 100% 100% 100%  Weight:      Height:        Sensation intact distally Intact pulses distally Dorsiflexion/Plantar flexion intact Incision: dressing C/D/I Compartment soft Wound VAC holding suction 120 mmHg and no output in canister  Lab Results  Component Value Date   WBC 10.5 02/18/2024   HGB 9.5 (L) 02/18/2024   HCT 31.4 (L) 02/18/2024   MCV 90.2 02/18/2024   PLT 558 (H) 02/18/2024   BMET    Component Value Date/Time   NA 134 (L) 02/19/2024 0330   K 4.9 02/19/2024 0330   CL 92 (L) 02/19/2024 0330   CO2 31 02/19/2024 0330   GLUCOSE 134 (H) 02/19/2024 0330   BUN 11 02/19/2024 0330   CREATININE 0.87 02/19/2024 0330   CALCIUM  7.7 (L) 02/19/2024 0330   GFRNONAA >60 02/19/2024 0330      Xray: Reduced proximal femoral replacement arthroplasty in good alignment without adverse features  Assessment/Plan: 1 Day Post-Op   Principal Problem:   Recurrent dislocation of left hip  Status post closed reduction left hip dislocation and irrigation debridement superficial wound dehiscence 02/18/24  Post op recs: WB: WBAT LLE with strict posterior hip precautions Abx: ancef  x23 hours post op, dc on bactrim  Imaging: PACU xrays Dressing: Prevena incisional wound VAC DVT prophylaxis: Aspirin  81 mg Follow up: 1 week after surgery for a wound check with Dr. Edna at  [facility].  Address: [address], [place], [place] [address]  Office Phone: [phone]    TORIBIO DELENA EDNA 02/19/2024, 6:42 AM   TORIBIO Edna, MD  Contact information:   [phone] 7am-5pm epic message Dr. Edna, or call office for patient follow up: [phone] After hours and holidays please check [email or web address] for group call information for [facility]

## 2024-02-20 ENCOUNTER — Inpatient Hospital Stay (HOSPITAL_COMMUNITY)

## 2024-02-20 DIAGNOSIS — M24452 Recurrent dislocation, left hip: Secondary | ICD-10-CM

## 2024-02-20 LAB — CBC
HCT: 21.4 % — ABNORMAL LOW (ref 39.0–52.0)
Hemoglobin: 6.6 g/dL — CL (ref 13.0–17.0)
MCH: 27.6 pg (ref 26.0–34.0)
MCHC: 30.8 g/dL (ref 30.0–36.0)
MCV: 89.5 fL (ref 80.0–100.0)
Platelets: 485 K/uL — ABNORMAL HIGH (ref 150–400)
RBC: 2.39 MIL/uL — ABNORMAL LOW (ref 4.22–5.81)
RDW: 16.2 % — ABNORMAL HIGH (ref 11.5–15.5)
WBC: 12.3 K/uL — ABNORMAL HIGH (ref 4.0–10.5)
nRBC: 0 % (ref 0.0–0.2)

## 2024-02-20 LAB — HEMOGLOBIN AND HEMATOCRIT, BLOOD
HCT: 18.6 % — ABNORMAL LOW (ref 39.0–52.0)
Hemoglobin: 5.5 g/dL — CL (ref 13.0–17.0)

## 2024-02-20 LAB — PREPARE RBC (CROSSMATCH)

## 2024-02-20 LAB — GLUCOSE, CAPILLARY: Glucose-Capillary: 121 mg/dL — ABNORMAL HIGH (ref 70–99)

## 2024-02-20 MED ORDER — SODIUM CHLORIDE 0.9% IV SOLUTION: Freq: Once | INTRAVENOUS | Status: DC

## 2024-02-20 MED ORDER — LACTATED RINGERS IV SOLN: INTRAVENOUS | Status: AC

## 2024-02-20 MED ORDER — SODIUM CHLORIDE 0.9 % IV SOLN
12.5000 mg | Freq: Four times a day (QID) | INTRAVENOUS | Status: DC | PRN
  Administered 2024-02-20: 12.5 mg via INTRAVENOUS
  Filled 2024-02-20: qty 12.5

## 2024-02-20 MED ORDER — PANTOPRAZOLE SODIUM 40 MG IV SOLR
40.0000 mg | Freq: Two times a day (BID) | INTRAVENOUS | Status: DC
  Administered 2024-02-20 – 2024-02-26 (×13): 40 mg via INTRAVENOUS
  Filled 2024-02-20 (×14): qty 10

## 2024-02-20 MED ORDER — PANTOPRAZOLE SODIUM 40 MG IV SOLR
40.0000 mg | INTRAVENOUS | Status: DC
  Administered 2024-02-20: 40 mg via INTRAVENOUS
  Filled 2024-02-20: qty 10

## 2024-02-20 MED ORDER — IOHEXOL 350 MG/ML SOLN
100.0000 mL | Freq: Once | INTRAVENOUS | Status: AC | PRN
  Administered 2024-02-20: 100 mL via INTRAVENOUS

## 2024-02-20 NOTE — Progress Notes (Signed)
 Orthopedic Tech Progress Note Patient Details:  Jonathon Anderson 09-Jul-1956 [id number]  Patient ID: Jonathon Anderson, male   DOB: 24-Dec-1955, 68 y.o.   MRN: [id number]  Jonathon Anderson 02/20/2024, 8:58 AM I checked with [facility] to see what hold up with brace was. Was informed brace being delivered presently.

## 2024-02-20 NOTE — Progress Notes (Signed)
 Attempted to insert # 18 Fr salem sump into pt's R nare, pt unable to assist (turning head side to side, throwing forehead back/opening airway) . SABRA Instructed pt to tuck chin and drink water  but unable to place tube. Pt refusing further attempts to insert NG tube. Will keep NPO. MD notified

## 2024-02-20 NOTE — Plan of Care (Signed)
  Problem: Clinical Measurements: Goal: Will remain free from infection Outcome: Not Progressing Goal: Diagnostic test results will improve Outcome: Not Progressing   Problem: Activity: Goal: Risk for activity intolerance will decrease Outcome: Not Progressing   Problem: Nutrition: Goal: Adequate nutrition will be maintained Outcome: Not Progressing   Problem: Elimination: Goal: Will not experience complications related to bowel motility Outcome: Not Progressing

## 2024-02-20 NOTE — Progress Notes (Signed)
 Patient vomited 3 times overnight, brown, medium.

## 2024-02-20 NOTE — Consult Note (Signed)
 [facility] Initial Consultation Note  [name] FMW:[id number] DOB: [birth year] DOA: 02/18/2024  PCP: Stephanie Charlene LITTIE, MD   Requesting Physician: Dr. Edna via Bernarda Mclean, PA   Reason for Consultation: Medical Management of Hypoxia, Emesis, Anemia  HPI: Jonathon Anderson is a 68 y.o. male with medical history significant for light stroke on ASA BID, HTN, HLD admitted to the hospital by the orthopedic service for closed reduction irrigation and debridement of left hip arthroplasty now status post wound VAC placement on 7/28 and being seen in consultation by the hospitalist service due to hypoxia, anemia and vomiting.  History is provided by the patient, as well as my conversation with PA Cockerham this morning.  The patient tells me that he had a bowel movement on 7/28, which was unremarkable.  Yesterday he started to get quite nauseous, states that he vomited at least once.  This morning, he has had multiple episodes of emesis which are noted to be foul-smelling like stool.  Patient endorses some mild abdominal discomfort.  He denies any cough or shortness of breath.  Apparently he was noted to be saturating 22 to 28% on room air at about 645 this morning.  He was noted to be gasping for breath, and vomited multiple times.  Oxygenation was noted to improve with rapid deep breathing, he was placed on supplemental oxygen currently is saturating 100% on 4 L nasal cannula oxygen.  He says that his nausea has subsided for the moment.  Hemoglobin is fallen to 6.6 this morning, no evidence of any kind of blood loss until he had the dark emesis this morning.  He denies any bright red blood in his emesis, or any blood in his stools.  He denies any prior history of GI bleeding, initially tells me that he has never had a colonoscopy.  However later he tells me that he has had 2 colonoscopies, was told that they were pretty much normal.  Review of Systems: Please see HPI for pertinent  positives and negatives. A complete 10 system review of systems are otherwise negative.  Past Medical History:  Diagnosis Date   Arthritis    Hyperlipidemia    Hypertension    Nicotine dependence    Stroke (HCC)    LIGHT STROKE - NUMBNESS AND TINGLING IN LEFT HAND - 10/2008 APPROX   Past Surgical History:  Procedure Laterality Date   APPLICATION OF WOUND VAC Left 01/09/2024   Procedure: APPLICATION, WOUND VAC;  Surgeon: Edna Toribio LABOR, MD;  Location: [facility]S;  Service: Orthopedics;  Laterality: Left;   APPLICATION OF WOUND VAC Left 02/18/2024   Procedure: APPLICATION, WOUND VAC;  Surgeon: Edna Toribio LABOR, MD;  Location: [facility];  Service: Orthopedics;  Laterality: Left;   CONVERSION TO TOTAL HIP Left 01/09/2024   Procedure: CONVERSION TO TOTAL HIP;  Surgeon: Edna Toribio LABOR, MD;  Location: [facility];  Service: Orthopedics;  Laterality: Left;   FEMUR IM NAIL Left 07/27/2022   Procedure: REPAIR FEMUR WITH AUTOGRAFT/ RIA;  Surgeon: Celena Sharper, MD;  Location: [facility] OR;  Service: Orthopedics;  Laterality: Left;   HARDWARE REMOVAL Left 06/06/2022   Procedure: HARDWARE REMOVAL;  Surgeon: Celena Sharper, MD;  Location: [facility] OR;  Service: Orthopedics;  Laterality: Left;   HARDWARE REMOVAL Bilateral 07/27/2022   Procedure: REPAIR OF LEFT PROXIMAL FEMUR NONUNION, REMOVAL OF ANTIBIOTIC SPACER, REAMED INTERMEDULLARY ASPRIATE RIGHT FEMUR;  Surgeon: Celena Sharper, MD;  Location: [facility] OR;  Service: Orthopedics;  Laterality: Bilateral;   HIP CLOSED  REDUCTION Left 01/19/2024   Procedure: Attempted CLOSED REDUCTION, HIP;  Surgeon: Cristy Bonner DASEN, MD;  Location[facility] OR;  Service: Orthopedics;  Laterality: Left;   HIP CLOSED REDUCTION  02/18/2024   Procedure: CLOSED REDUCTION, HIP;  Surgeon: Edna Toribio LABOR, MD;  Location: [facility];  Service: Orthopedics;;   INCISION AND DRAINAGE OF WOUND Left 02/18/2024   Procedure: IRRIGATION AND DEBRIDEMENT WOUND;  Surgeon: Edna Toribio LABOR, MD;  Location: [facility];   Service: Orthopedics;  Laterality: Left;   INTRAMEDULLARY (IM) NAIL INTERTROCHANTERIC Left 05/11/2021   Procedure: INTRAMEDULLARY (IM) NAIL INTERTROCHANTRIC OF LEFT SUBTHROCHANTRIC HIP FRACTURE;  Surgeon: Edie Norleen PARAS, MD;  Location: [facility] ORS;  Service: Orthopedics;  Laterality: Left;   ORIF FEMUR FRACTURE Left 06/06/2022   Procedure: OPEN REDUCTION INTERNAL FIXATION FEMORAL PROXIMAL FRACTURE;  Surgeon: Celena Sharper, MD;  Location: [facility] OR;  Service: Orthopedics;  Laterality: Left;   TOTAL HIP REVISION Left 01/21/2024   Procedure: Open Hip Reduction with Local Tissue Advancement;  Surgeon: Edna Toribio LABOR, MD;  Location: [facility] OR;  Service: Orthopedics;  Laterality: Left;    Social History:  reports that he quit smoking about 20 months ago. His smoking use included cigarettes. He has never used smokeless tobacco. He reports current alcohol  use. He reports that he does not use drugs.  No Known Allergies  Family History  Problem Relation Age of Onset   Heart attack Mother    Hypertension Brother    Heart failure Paternal Grandfather      Prior to Admission medications   Medication Sig Start Date End Date Taking? Authorizing Provider  acetaminophen  (TYLENOL ) 500 MG tablet Take 1,000 mg by mouth in the morning and at bedtime.   Yes [provider]  amLODipine  (NORVASC ) 10 MG tablet Take 10 mg by mouth in the morning. 10/23/23  Yes [provider]  aspirin  EC 81 MG tablet Take 1 tablet (81 mg total) by mouth 2 (two) times daily for 28 days. Swallow whole. 02/19/24 03/18/24 Yes Renae Bernarda HERO, PA-C  aspirin  EC 81 MG tablet Take 81 mg by mouth 2 (two) times daily. Swallow whole.   Yes [provider]  atorvastatin  (LIPITOR) 20 MG tablet Take 20 mg by mouth at bedtime. 11/28/20  Yes [provider]  celecoxib  (CELEBREX ) 100 MG capsule Take 1 capsule (100 mg total) by mouth 2 (two) times daily for 14 days. 02/18/24 03/03/24 Yes Renae Bernarda HERO, PA-C   cyclobenzaprine  (FLEXERIL ) 10 MG tablet Take 1 tablet (10 mg total) by mouth every 12 (twelve) hours as needed for muscle spasms. 02/18/24  Yes Renae Bernarda HERO, PA-C  magnesium  hydroxide (MILK OF MAGNESIA) 400 MG/5ML suspension Take 15 mLs by mouth daily as needed (constipation.).   Yes [provider]  Menthol , Topical Analgesic, (BIOFREEZE EX) Apply 1 Application topically daily as needed (pain).   Yes [provider]  metoprolol  tartrate (LOPRESSOR ) 100 MG tablet Take 100 mg by mouth 2 (two) times daily.   Yes [provider]  ondansetron  (ZOFRAN ) 4 MG tablet Take 1 tablet (4 mg total) by mouth every 6 (six) hours as needed for nausea. 01/24/24  Yes Elnora Ip, MD  oxyCODONE  (OXY IR/ROXICODONE ) 5 MG immediate release tablet Take 5 mg by mouth every 4 (four) hours as needed for severe pain (pain score 7-10).   Yes [provider]  oxyCODONE  (ROXICODONE ) 5 MG immediate release tablet Take 1 tablet (5 mg total) by mouth every 4 (four) hours as needed for up to  7 days for severe pain (pain score 7-10) or moderate pain (pain score 4-6). 02/18/24 02/25/24 Yes Renae Bernarda HERO, PA-C  polyethylene glycol (MIRALAX ) 17 g packet Take 17 g by mouth daily. 02/18/24  Yes Cockerham, Alicia M, PA-C  sulfamethoxazole -trimethoprim  (BACTRIM  DS) 800-160 MG tablet Take 1 tablet by mouth 2 (two) times daily for 14 days. 02/18/24 03/03/24 Yes Renae Bernarda HERO, PA-C    Physical Exam: BP 94/67 (BP Location: Left Arm)   Pulse 88   Temp 97.9 F (36.6 C) (Oral)   Resp 16   Ht 5' 7 (1.702 m)   Wt 101 kg   SpO2 100%   BMI 34.87 kg/m   General:  Alert, oriented, calm, in no acute distress, no cough, speaking full sentences.  Currently wearing 4 L nasal cannula oxygen. Cardiovascular: RRR, no murmurs or rubs, no peripheral edema  Respiratory: Breath sounds are globally very distant, diminished with almost no breath sounds in the left chest.  No tachypnea, no active  wheezing, rhonchi, no crackles Abdomen: soft, nontender, nondistended though large brace does limit abdominal exam Skin: dry, no rashes  Musculoskeletal: no joint effusions, normal range of motion  Psychiatric: appropriate affect, normal speech  Neurologic: extraocular muscles intact, clear speech, moving all extremities with intact sensorium         Recent Labs and Imaging Reviewed:  Basic Metabolic Panel: Recent Labs  Lab 02/18/24 1045 02/19/24 0330  NA 135 134*  K 4.6 4.9  CL 95* 92*  CO2 28 31  GLUCOSE 96 134*  BUN 11 11  CREATININE 0.82 0.87  CALCIUM  8.2* 7.7*   Liver Function Tests: Recent Labs  Lab 02/18/24 1045  AST 13*  ALT 8  ALKPHOS 131*  BILITOT 1.0  PROT 6.9  ALBUMIN  2.1*   No results for input(s): LIPASE, AMYLASE in the last 168 hours. No results for input(s): AMMONIA in the last 168 hours. CBC: Recent Labs  Lab 02/18/24 1045 02/19/24 0557 02/20/24 0334  WBC 10.5 10.4 12.3*  NEUTROABS 9.1*  --   --   HGB 9.5* 6.9* 6.6*  HCT 31.4* 22.6* 21.4*  MCV 90.2 89.7 89.5  PLT 558* 478* 485*   Cardiac Enzymes: No results for input(s): CKTOTAL, CKMB, CKMBINDEX, TROPONINI in the last 168 hours.  BNP (last 3 results) No results for input(s): BNP in the last 8760 hours.  ProBNP (last 3 results) No results for input(s): PROBNP in the last 8760 hours.  CBG: Recent Labs  Lab 02/20/24 0700  GLUCAP 121*    Radiological Exams on Admission: DG HIP UNILAT W OR W/O PELVIS 2-3 VIEWS LEFT Result Date: 02/18/2024 CLINICAL DATA:  Postop reduction EXAM: DG HIP (WITH OR WITHOUT PELVIS) 2-3V LEFT COMPARISON:  02/18/2024 FINDINGS: Interval reduction of the previously seen dislocated left hip prosthesis. Normal alignment. No visible acute fracture. IMPRESSION: Interval reduction. Electronically Signed   By: Franky Crease M.D.   On: 02/18/2024 17:39   DG HIP UNILAT WITH PELVIS 2-3 VIEWS LEFT Result Date: 02/18/2024 CLINICAL DATA:  Dislocated left hip  prosthesis EXAM: DG HIP (WITH OR WITHOUT PELVIS) 2-3V LEFT COMPARISON:  02/18/2024 FINDINGS: Multiple intraoperative images initially demonstrate dislocated left hip prosthesis. Final images demonstrate reduction. IMPRESSION: Intraoperative imaging for reduction of the dislocated left hip prosthesis. Electronically Signed   By: Franky Crease M.D.   On: 02/18/2024 17:38   DG Pelvis Portable Result Date: 02/18/2024 CLINICAL DATA:  Left hip instability preoperative EXAM: PORTABLE PELVIS 1-2 VIEWS COMPARISON:  January 18, 2024 FINDINGS:  Status post left hip total arthroplasty with superolateral dislocation of the left femoral component surgical clips superimposing the right femoral trochanter. No acute osseous fracture. Soft tissues are unremarkable. He she high, Similar to prior radiograph. IMPRESSION: Superolateral dislocation of left hip arthroplasty femoral component. Electronically Signed   By: Megan  Zare M.D.   On: 02/18/2024 14:53   DG C-Arm 1-60 Min-No Report Result Date: 02/18/2024 Fluoroscopy was utilized by the requesting physician.  No radiographic interpretation.   DG C-Arm 1-60 Min-No Report Result Date: 02/18/2024 Fluoroscopy was utilized by the requesting physician.  No radiographic interpretation.    Summary and Recommendations: DERON POOLE is a 68 y.o. male with medical history significant for light stroke on ASA BID, HTN, HLD admitted to the hospital by the orthopedic service for closed reduction irrigation and debridement of left hip arthroplasty now status post wound VAC placement on 7/28 and being seen in consultation by the hospitalist service due to hypoxia, anemia and vomiting.  Report of O2 saturation in the 20s is severe use, however patient is clearly developing worsening anemia, vomiting and appears to have some hypoxia.  Given the constellation of symptoms, concern would be for possible bowel obstruction leading to vomiting and perhaps aspiration pneumonitis.  He also  seems to have possibility of blood loss anemia as his hemoglobin is persistently dropping, his left hip wound VAC shows no evidence of acute bleeding.  He has a mild leukocytosis, but no other evidence of acute bacterial infection.  Leukocytosis may simply be reactive to his recent vomiting. -Discontinue aspirin  -Continue supplemental oxygen, and wean as tolerated -Start IV PPI daily -Clear liquid diet -Check stat CT angio chest to rule out PE and aspiration -Check CT abdomen with IV and p.o. contrast if able to tolerate, to evaluate for etiology of vomiting and possible source of blood loss -Agree with 2 unit PRBC transfusion -Check Hemoccult stool -Check posttransfusion hematocrit and then trend  Thank you for involving us  in the care of your patient.  [facility] will continue to follow along with you.  Time spent: 59 minutes  Tammala Weider CHRISTELLA Gail MD [facility] Pager [phone]  If 7PM-7AM, please contact night-coverage [email or web address] Password [email or web address]  02/20/2024, 9:53 AM

## 2024-02-20 NOTE — Progress Notes (Signed)
 1538: Spoke with Marjorie RIGGERS and Vertell Pringle PA-C of General Surgery. Discussed patient's post-operative concerns including dark brown-black recurrent emesis, persistent abdominal pain, declining Hgb (critical read of 5.5 discussed with Hospitalist as well), and incontinence, along with recent imaging.    Unclear etiology at this time. They don't think he has a current obstruction based on imaging and therefore don't think he needs a surgical consult at this time. Possible that fluid seen on CT around hip area could be due to blood or serous fluid near the surgical incision, and possibly contributing to declining Hgb.  Recommended GI consult believing this is likely more appropriate and pt may need NG tube and/or endoscopic eval.  Discussed with Dr. Edna and Dr. Zella who are in agreement.  1601: Spoke with [place] from Gastroenterology regarding patient case.  Plans to have someone come evaluate the patient and circle back.

## 2024-02-20 NOTE — Progress Notes (Cosign Needed Addendum)
 1642: Consulted with Dr. Kriss of GI -- suspicion for more ileus than ulcer. Recommended NG tube, NPO status, IV PPI q 12 hrs, PT-INR, continuing with transfusion(s) and H&H checks -- orders placed by GI. Plans to come evaluate him tomorrow.  May offer EGD depending on how things go.  Hospitalist and Dr. Edna aware.

## 2024-02-20 NOTE — Progress Notes (Signed)
    2 Days Post-Op Procedure(s) (LRB): IRRIGATION AND DEBRIDEMENT WOUND (Left) APPLICATION, WOUND VAC (Left) CLOSED REDUCTION, HIP  Subjective:  Informed of rapid response upon arrival by nursing staff.  O2 sats dropped to around 22%-28% about 645 am.  Patient was gasping for breath, vomited three times, and one of the episodes had the odor of stool with brown discoloration.  Patient's oxygen improved with rapid deep breaths.  Now worsening pain of right thigh.  Patient states he felt unwell and now feels somewhat okay.    Patient reports pain as moderate-severe.  Hgb 6.6.  Unable to do PT yesterday due to lack of brace present.  Orthopedic technician was contacted Monday who reached out to the company, still awaiting brace.  Abduction brace currently in place.   Objective:   VITALS:   Vitals:   02/19/24 1007 02/19/24 1417 02/19/24 2130 02/20/24 0656  BP: 124/67 115/66 125/74 94/67  Pulse: 95 87 91 88  Resp: 18 18 16    Temp: 98.3 F (36.8 C) 97.8 F (36.6 C) 97.6 F (36.4 C) 97.9 F (36.6 C)  TempSrc:    Oral  SpO2: 100% 100% 100% 100%  Weight:      Height:        AAOx4, resting in NAD Sensation intact distally Intact pulses distally Dorsiflexion/Plantar flexion intact Incision: dressing C/D/I Compartment soft Wound VAC holding suction 120 mmHg and no output in canister Left thigh tender to palpation, no erythema or swelling or bruising   Lab Results  Component Value Date   WBC 12.3 (H) 02/20/2024   HGB 6.6 (LL) 02/20/2024   HCT 21.4 (L) 02/20/2024   MCV 89.5 02/20/2024   PLT 485 (H) 02/20/2024   BMET    Component Value Date/Time   NA 134 (L) 02/19/2024 0330   K 4.9 02/19/2024 0330   CL 92 (L) 02/19/2024 0330   CO2 31 02/19/2024 0330   GLUCOSE 134 (H) 02/19/2024 0330   BUN 11 02/19/2024 0330   CREATININE 0.87 02/19/2024 0330   CALCIUM  7.7 (L) 02/19/2024 0330   GFRNONAA >60 02/19/2024 0330      Xray: Reduced proximal femoral replacement arthroplasty  in good alignment without adverse features  Assessment/Plan: 2 Days Post-Op   Principal Problem:   Recurrent dislocation of left hip  Status post closed reduction left hip dislocation and irrigation debridement superficial wound dehiscence 02/18/24  7/30: Plan for transfusion with Hgb of 6.6.  2 units ordered.  Details of this morning events discussed with nursing staff and rapid response team.  Plan for hospitalist consult to evaluate for post-operative concerns such as pulmonary embolism or bowel obstruction.   57: Spoke with Dr. Roxane of hospitalist team regarding patient case, shared similar concerns.  Plan for further workup likely including CTA.  Post op recs: WB: WBAT LLE with strict posterior hip precautions Abx: ancef  x23 hours post op, dc on bactrim  Imaging: PACU xrays Dressing: Prevena incisional wound VAC DVT prophylaxis: Aspirin  81 mg Follow up: 1 week after surgery for a wound check with Dr. Edna at [facility].  Address: [address], [place], [place] [address]  Office Phone: [phone]    Bernarda CHRISTELLA Mclean 02/20/2024, 7:22 AM    Contact information:   [phone] 7am-5pm epic message Dr. Edna, or call office for patient follow up: [phone] After hours and holidays please check [email or web address] for group call information for [facility]

## 2024-02-20 NOTE — Progress Notes (Addendum)
 PT Cancellation Note  Patient Details Name: Jonathon Anderson MRN: [id number] DOB: March 20, 1956   Cancelled Treatment:    Reason Eval/Treat Not Completed: Patient not medically ready;Other (comment) Pt noted to still be waiting for hip abduction brace, as well as RRT called this AM 2/2 low BP, pain, vomiting. Pt also noted to have Hgb trending down, 6.6 this AM. Will hold PT evaluation until medically appropriate.  12:28: Followed up with nurse who reports pt is still not feeling well, fatigued, & still awaiting initiation of blood transfusion. Will hold PT evaluation & f/u tomorrow. Per chart, pt has hip brace in room.    Richerd Pinal, PT, DPT 02/20/24, 12:29 PM  Richerd CHRISTELLA Pinal 02/20/2024, 7:48 AM

## 2024-02-20 NOTE — Progress Notes (Signed)
 Date and time results received: 02/20/24 1345  Test: Hgb Critical Value: 5.5  Name of Provider Notified: Dr. Edna and Bernarda Mclean - PA  Orders Received? Or Actions Taken?: No new orders. Patient currently receiving two units of PRBC.

## 2024-02-20 NOTE — Progress Notes (Signed)
 Patient's O2 sats 22%. Patient is breathing rapidly and complaining of burning in his right thigh of level 10. Per Sydnee Record NT blood pressure 80's over 20's. Called Alan Portugal rapid response nurse.

## 2024-02-20 NOTE — Significant Event (Signed)
 Rapid response:  Primary RN reports during morning rounds with vital signs patient had hypotension with rapid onset of shortness of breath, right hip and lower leg sharp burning pain. Previously in the night patient vomited multiple times with brown foul smelling emesis. Patient is alert/oriented x 4, no abnormal lung sounds, oxygen saturation initially 80's on 2L, increased to 4L and improved to 100%. After twenty minutes and EKG completion, pain eased slightly from 10/10 to 8/10. Attending MD notified by primary RN and said thank you but no new orders given. Event going into day shift, staff seeking out day provider. See attached flowsheet for most recent vital signs. Alan Portugal, RN

## 2024-02-20 NOTE — Progress Notes (Signed)
 Called Dr. Edna and informed him of patient's O2 sats of 22 %, rapid respirations, pain in the patient's right thigh and vomiting x3 episodes of brown, medium sized emesis. Also informed him of patient's hemoglobin of 6.6.

## 2024-02-21 ENCOUNTER — Inpatient Hospital Stay (HOSPITAL_COMMUNITY)

## 2024-02-21 DIAGNOSIS — M24452 Recurrent dislocation, left hip: Secondary | ICD-10-CM | POA: Diagnosis not present

## 2024-02-21 LAB — TYPE AND SCREEN
ABO/RH(D): O POS
Antibody Screen: NEGATIVE
Unit division: 0
Unit division: 0

## 2024-02-21 LAB — BPAM RBC
Blood Product Expiration Date: 202508262359
Blood Product Expiration Date: 202508302359
ISSUE DATE / TIME: 202507301313
ISSUE DATE / TIME: 202507301752
Unit Type and Rh: 5100
Unit Type and Rh: 5100

## 2024-02-21 LAB — HEMOGLOBIN AND HEMATOCRIT, BLOOD
HCT: 27.1 % — ABNORMAL LOW (ref 39.0–52.0)
HCT: 27.5 % — ABNORMAL LOW (ref 39.0–52.0)
Hemoglobin: 8.4 g/dL — ABNORMAL LOW (ref 13.0–17.0)
Hemoglobin: 8.5 g/dL — ABNORMAL LOW (ref 13.0–17.0)

## 2024-02-21 LAB — APTT: aPTT: 60 s — ABNORMAL HIGH (ref 24–36)

## 2024-02-21 LAB — CBC WITH DIFFERENTIAL/PLATELET
Abs Immature Granulocytes: 0.09 K/uL — ABNORMAL HIGH (ref 0.00–0.07)
Basophils Absolute: 0 K/uL (ref 0.0–0.1)
Basophils Relative: 0 %
Eosinophils Absolute: 0.1 K/uL (ref 0.0–0.5)
Eosinophils Relative: 1 %
HCT: 23.6 % — ABNORMAL LOW (ref 39.0–52.0)
Hemoglobin: 7.5 g/dL — ABNORMAL LOW (ref 13.0–17.0)
Immature Granulocytes: 1 %
Lymphocytes Relative: 14 %
Lymphs Abs: 1.3 K/uL (ref 0.7–4.0)
MCH: 27.4 pg (ref 26.0–34.0)
MCHC: 31.8 g/dL (ref 30.0–36.0)
MCV: 86.1 fL (ref 80.0–100.0)
Monocytes Absolute: 0.6 K/uL (ref 0.1–1.0)
Monocytes Relative: 6 %
Neutro Abs: 7.4 K/uL (ref 1.7–7.7)
Neutrophils Relative %: 78 %
Platelets: 344 K/uL (ref 150–400)
RBC: 2.74 MIL/uL — ABNORMAL LOW (ref 4.22–5.81)
RDW: 16.4 % — ABNORMAL HIGH (ref 11.5–15.5)
WBC: 9.4 K/uL (ref 4.0–10.5)
nRBC: 0.3 % — ABNORMAL HIGH (ref 0.0–0.2)

## 2024-02-21 LAB — PROTIME-INR
INR: 7.1 (ref 0.8–1.2)
INR: 7.3 (ref 0.8–1.2)
INR: 7.9 (ref 0.8–1.2)
Prothrombin Time: 63.5 s — ABNORMAL HIGH (ref 11.4–15.2)
Prothrombin Time: 65.2 s — ABNORMAL HIGH (ref 11.4–15.2)
Prothrombin Time: 69.4 s — ABNORMAL HIGH (ref 11.4–15.2)

## 2024-02-21 LAB — TROPONIN I (HIGH SENSITIVITY)
Troponin I (High Sensitivity): 2 ng/L (ref <18)
Troponin I (High Sensitivity): <2 ng/L (ref <18)

## 2024-02-21 MED ORDER — VITAMIN K1 10 MG/ML IJ SOLN
10.0000 mg | Freq: Once | INTRAVENOUS | Status: AC
  Administered 2024-02-21: 10 mg via INTRAVENOUS
  Filled 2024-02-21: qty 1

## 2024-02-21 MED ORDER — SODIUM CHLORIDE 0.9% IV SOLUTION: Freq: Once | INTRAVENOUS | Status: AC

## 2024-02-21 MED ORDER — VITAMIN K1 10 MG/ML IJ SOLN
1.0000 mg | Freq: Once | INTRAVENOUS | Status: DC
  Filled 2024-02-21: qty 0.1

## 2024-02-21 MED ORDER — GADOBUTROL 1 MMOL/ML IV SOLN
10.0000 mL | Freq: Once | INTRAVENOUS | Status: AC | PRN
  Administered 2024-02-21: 10 mL via INTRAVENOUS

## 2024-02-21 NOTE — Progress Notes (Signed)
 PROGRESS NOTE    Jonathon Anderson  FMW:[id number] DOB: 1956/04/22 DOA: 02/18/2024 PCP: Stephanie Charlene LITTIE, MD   Brief Narrative:  Jonathon Anderson is a 68 y.o. male with medical history significant for light stroke on ASA BID, HTN, HLD admitted to the hospital by the orthopedic service for closed reduction irrigation and debridement of left hip arthroplasty now status post wound VAC placement on 7/28 and hospitalist service/TRH was consulted 02/20/2024 due to hypoxia, anemia and vomiting. Patient endorsed some mild abdominal discomfort.  He denied any cough or shortness of breath.  Hemoglobin fallen to 6.6 , no evidence of any kind of blood loss, he denied any bright red blood in his emesis, or any blood in his stools.  He denied any prior history of GI bleeding.  Assessment & Plan:   Principal Problem:   Recurrent dislocation of left hip  Status post closed reduction left hip dislocation and irrigation debridement superficial wound dehiscence 02/18/24 With evidence of complex periprosthetic collection adjacent to the left hip arthroplasty which may be either concerning for infectious or hematoma.  Will defer to primary service.  Acute hypoxic respiratory failure:Was likely due to severe acute anemia.  Out of concern of PE, consulting hospitalist ordered CT angiogram of the chest which ruled out PE or any other lung pathology.  Acute blood loss anemia: Patient's hemoglobin preoperatively was 9.5, postoperatively dropped significantly to 5.5 on 02/20/2024.  Patient received 2 units of PRBC transfusion, hemoglobin 8.5 today.  Patient's aspirin  was discontinued by hospitalist.  FOBT was ordered which is still pending.  Likely GI source, patient started on Protonix  twice daily.  Primary service/orthopedics has now consulted GI.  Will defer to them.  Supratherapeutic INR/Possible liver mass: As a part of workup for anemia and hypoxia, hospitalist ordered CT abdomen pelvis which showed incidental  finding of A 3.7 x 2.2 cm heterogeneously enhancing lesion in the posterior right lobe of the liver is not characterized on this CT. Further characterization with MRI without and with contrast recommended.  MRI abdomen is completed and result is pending.  Patient's INR is supratherapeutic today at 7.  Recheck was also around 7.1.  Hypercoagulability is also contributing to patient's anemia.  Primary service has given the patient vitamin K  IV this morning.  Essential hypertension: Controlled.  Continue amlodipine .  Chronic mild hyponatremia: Baseline around 130, at baseline now.  DVT prophylaxis: SCDs Start: 02/18/24 1634 Place TED hose Start: 02/18/24 1634   Code Status: Full Code  Family Communication:  None present at bedside.  Plan of care discussed with patient in length and he/she verbalized understanding and agreed with it.  Status is: Inpatient Remains inpatient appropriate because: Disposition per primary service   Estimated body mass index is 34.87 kg/m as calculated from the following:   Height as of this encounter: 5' 7 (1.702 m).   Weight as of this encounter: 101 kg.    Nutritional Assessment: Body mass index is 34.87 kg/m.SABRA Seen by dietician.  I agree with the assessment and plan as outlined below: Nutrition Status:        . Skin Assessment: I have examined the patient's skin and I agree with the wound assessment as performed by the wound care RN as outlined below:    Consultants:  TRH and GI  Procedures:  As above  Antimicrobials:  Anti-infectives (From admission, onward)    Start     Dose/Rate Route Frequency Ordered Stop   02/19/24 0800  ceFAZolin  (ANCEF ) IVPB 2g/100 mL  premix        2 g 200 mL/hr over 30 Minutes Intravenous Every 8 hours 02/19/24 0548 02/22/24 0759   02/18/24 1800  ceFAZolin  (ANCEF ) IVPB 2g/100 mL premix        2 g 200 mL/hr over 30 Minutes Intravenous Every 6 hours 02/18/24 1633 02/18/24 2359   02/18/24 1305  vancomycin   (VANCOCIN ) powder  Status:  Discontinued          As needed 02/18/24 1305 02/18/24 1409   02/18/24 1045  ceFAZolin  (ANCEF ) IVPB 2g/100 mL premix        2 g 200 mL/hr over 30 Minutes Intravenous On call to O.R. 02/18/24 1043 02/18/24 1233   02/18/24 0000  sulfamethoxazole -trimethoprim  (BACTRIM  DS) 800-160 MG tablet        1 tablet Oral 2 times daily 02/18/24 1014 03/03/24 2359         Subjective: Patient seen and examined, he had no complaints at all.  Objective: Vitals:   02/20/24 1806 02/20/24 1821 02/20/24 2129 02/21/24 0413  BP: 127/65 123/67 123/67 118/64  Pulse: 99 100 (!) 103 98  Resp: 18 16 15 20   Temp: 98.5 F (36.9 C) 98.4 F (36.9 C) 98.2 F (36.8 C) 98.1 F (36.7 C)  TempSrc: Oral Oral Oral   SpO2: (!) 89% 94% 98% 98%  Weight:      Height:        Intake/Output Summary (Last 24 hours) at 02/21/2024 1329 Last data filed at 02/21/2024 1109 Gross per 24 hour  Intake 1980.5 ml  Output 750 ml  Net 1230.5 ml   Filed Weights   02/18/24 0910 02/18/24 1047  Weight: 101 kg 101 kg    Examination:  General exam: Appears calm and comfortable  Respiratory system: Clear to auscultation. Respiratory effort normal. Cardiovascular system: S1 & S2 heard, RRR. No JVD, murmurs, rubs, gallops or clicks. No pedal edema. Gastrointestinal system: Abdomen is nondistended, soft and nontender. No organomegaly or masses felt. Normal bowel sounds heard. Central nervous system: Alert and oriented. No focal neurological deficits. Extremities: Symmetric 5 x 5 power. Skin: No rashes, lesions or ulcers Psychiatry: Judgement and insight appear normal. Mood & affect appropriate.    Data Reviewed: I have personally reviewed following labs and imaging studies  CBC: Recent Labs  Lab 02/18/24 1045 02/19/24 0557 02/20/24 0334 02/20/24 1241 02/21/24 0305 02/21/24 0941  WBC 10.5 10.4 12.3*  --   --   --   NEUTROABS 9.1*  --   --   --   --   --   HGB 9.5* 6.9* 6.6* 5.5* 8.4* 8.5*   HCT 31.4* 22.6* 21.4* 18.6* 27.1* 27.5*  MCV 90.2 89.7 89.5  --   --   --   PLT 558* 478* 485*  --   --   --    Basic Metabolic Panel: Recent Labs  Lab 02/18/24 1045 02/19/24 0330  NA 135 134*  K 4.6 4.9  CL 95* 92*  CO2 28 31  GLUCOSE 96 134*  BUN 11 11  CREATININE 0.82 0.87  CALCIUM  8.2* 7.7*   GFR: Estimated Creatinine Clearance: 90.8 mL/min (by C-G formula based on SCr of 0.87 mg/dL). Liver Function Tests: Recent Labs  Lab 02/18/24 1045  AST 13*  ALT 8  ALKPHOS 131*  BILITOT 1.0  PROT 6.9  ALBUMIN  2.1*   No results for input(s): LIPASE, AMYLASE in the last 168 hours. No results for input(s): AMMONIA in the last 168 hours. Coagulation Profile: Recent Labs  Lab 02/21/24 0305 02/21/24 0437  INR 7.1* 7.3*   Cardiac Enzymes: No results for input(s): CKTOTAL, CKMB, CKMBINDEX, TROPONINI in the last 168 hours. BNP (last 3 results) No results for input(s): PROBNP in the last 8760 hours. HbA1C: No results for input(s): HGBA1C in the last 72 hours. CBG: Recent Labs  Lab 02/20/24 0700  GLUCAP 121*   Lipid Profile: No results for input(s): CHOL, HDL, LDLCALC, TRIG, CHOLHDL, LDLDIRECT in the last 72 hours. Thyroid Function Tests: No results for input(s): TSH, T4TOTAL, FREET4, T3FREE, THYROIDAB in the last 72 hours. Anemia Panel: No results for input(s): VITAMINB12, FOLATE, FERRITIN, TIBC, IRON , RETICCTPCT in the last 72 hours. Sepsis Labs: No results for input(s): PROCALCITON, LATICACIDVEN in the last 168 hours.  Recent Results (from the past 240 hours)  Aerobic/Anaerobic Culture w Gram Stain (surgical/deep wound)     Status: None (Preliminary result)   Collection Time: 02/18/24  1:03 PM   Specimen: Soft Tissue, Other  Result Value Ref Range Status   Specimen Description   Final    TISSUE Performed at [facility], [address], [place], [place] [address]    Special Requests   Final     NONE Performed at [facility], [address], [place], [place] [address]    Gram Stain   Final    NO WBC SEEN NO ORGANISMS SEEN Performed at [facility], [address], [place], [place] [address]    Culture RARE STAPHYLOCOCCUS AUREUS  Final   Report Status PENDING  Incomplete  Aerobic/Anaerobic Culture w Gram Stain (surgical/deep wound)     Status: None (Preliminary result)   Collection Time: 02/18/24  1:03 PM   Specimen: Soft Tissue, Other  Result Value Ref Range Status   Specimen Description   Final    TISSUE Performed at [facility], [address] [place], [place] [address]    Special Requests   Final    NONE Performed at [facility], [address] [place], [place] [address]    Gram Stain NO WBC SEEN NO ORGANISMS SEEN   Final   Culture   Final    RARE STAPHYLOCOCCUS AUREUS SUSCEPTIBILITIES TO FOLLOW Performed at [facility], [address] [place], [place] [address]    Report Status PENDING  Incomplete     Radiology Studies: CT Angio Chest Pulmonary Embolism (PE) W or WO Contrast Result Date: 02/20/2024 CLINICAL DATA:  Concern for pulmonary embolism and bowel obstruction. EXAM: CT ANGIOGRAPHY CHEST CT ABDOMEN AND PELVIS WITH CONTRAST TECHNIQUE: Multidetector CT imaging of the chest was performed using the standard protocol during bolus administration of intravenous contrast. Multiplanar CT image reconstructions and MIPs were obtained to evaluate the vascular anatomy. Multidetector CT imaging of the abdomen and pelvis was performed using the standard protocol during bolus administration of intravenous contrast. RADIATION DOSE REDUCTION: This exam was performed according to the departmental dose-optimization program which includes automated exposure control, adjustment of the mA and/or kV according to patient size and/or use of iterative reconstruction technique. CONTRAST:  OMNIPAQUE  IOHEXOL  350 MG/ML SOLN  COMPARISON:  None Available. FINDINGS: CTA CHEST FINDINGS Cardiovascular: There is no cardiomegaly or pericardial effusion. The thoracic aorta is unremarkable. The origins of the great vessels of the aortic arch are patent. Evaluation of the pulmonary arteries is limited due to respiratory motion. No central pulmonary artery embolus identified. Mediastinum/Nodes: No hilar or mediastinal adenopathy. The esophagus is grossly unremarkable. No mediastinal fluid  collection. Lungs/Pleura: No focal consolidation, pleural effusion, pneumothorax. There are bibasilar atelectasis. The central airways are patent. Musculoskeletal: Degenerative changes of the spine. No acute osseous pathology. Review of the MIP images confirms the above findings. CT ABDOMEN and PELVIS FINDINGS No intra-abdominal free air or free fluid. Hepatobiliary: There is a 6.8 x 7.3 cm cyst in the dome of the liver. Additional 3.7 x 2.2 cm heterogeneously enhancing lesion in the posterior right lobe of the liver is not characterized on this CT. Further characterization with MRI without and with contrast recommended. No biliary dilatation. There is sludge or small stones in the gallbladder. No pericholecystic fluid or evidence of acute cholecystitis by CT. Pancreas: Unremarkable. No pancreatic ductal dilatation or surrounding inflammatory changes. Spleen: Normal in size without focal abnormality. Adrenals/Urinary Tract: The adrenal glands are unremarkable. There is no hydronephrosis on either side. There is symmetric enhancement and excretion of contrast by both kidneys. The visualized ureters and urinary bladder appear unremarkable. Stomach/Bowel: There is sigmoid diverticulosis. There is no bowel obstruction or active inflammation. The appendix is normal. Vascular/Lymphatic: Mild aortoiliac atherosclerotic disease. The IVC is unremarkable. No portal venous gas. There is no adenopathy. Reproductive: The prostate and seminal vesicles are grossly remarkable.  Other: Small fat containing umbilical hernia. Musculoskeletal: There is a total left hip arthroplasty. A 10 x 11 cm fluid collection with pockets of air and enhancing walls adjacent to the arthroplasty is may be postsurgical but concerning for an infectious process. Clinical correlation is recommended. There is degenerative changes of the spine. No acute osseous pathology. Review of the MIP images confirms the above findings. IMPRESSION: 1. No acute intrathoracic pathology. No CT evidence of central pulmonary artery embolus. 2. Sigmoid diverticulosis. No bowel obstruction. Normal appendix. 3. A complex periprostatic collection adjacent to the left hip arthroplasty may be postsurgical but concerning for an infectious process. 4. A 3.7 x 2.2 cm heterogeneously enhancing lesion in the posterior right lobe of the liver is not characterized on this CT. Further characterization with MRI without and with contrast recommended. 5. Cholelithiasis. 6.  Aortic Atherosclerosis (ICD10-I70.0). Electronically Signed   By: Vanetta Chou M.D.   On: 02/20/2024 11:29   CT ABDOMEN PELVIS W CONTRAST Result Date: 02/20/2024 CLINICAL DATA:  Concern for pulmonary embolism and bowel obstruction. EXAM: CT ANGIOGRAPHY CHEST CT ABDOMEN AND PELVIS WITH CONTRAST TECHNIQUE: Multidetector CT imaging of the chest was performed using the standard protocol during bolus administration of intravenous contrast. Multiplanar CT image reconstructions and MIPs were obtained to evaluate the vascular anatomy. Multidetector CT imaging of the abdomen and pelvis was performed using the standard protocol during bolus administration of intravenous contrast. RADIATION DOSE REDUCTION: This exam was performed according to the departmental dose-optimization program which includes automated exposure control, adjustment of the mA and/or kV according to patient size and/or use of iterative reconstruction technique. CONTRAST:  OMNIPAQUE  IOHEXOL  350 MG/ML SOLN  COMPARISON:  None Available. FINDINGS: CTA CHEST FINDINGS Cardiovascular: There is no cardiomegaly or pericardial effusion. The thoracic aorta is unremarkable. The origins of the great vessels of the aortic arch are patent. Evaluation of the pulmonary arteries is limited due to respiratory motion. No central pulmonary artery embolus identified. Mediastinum/Nodes: No hilar or mediastinal adenopathy. The esophagus is grossly unremarkable. No mediastinal fluid collection. Lungs/Pleura: No focal consolidation, pleural effusion, pneumothorax. There are bibasilar atelectasis. The central airways are patent. Musculoskeletal: Degenerative changes of the spine. No acute osseous pathology. Review of the MIP images confirms the above findings. CT ABDOMEN and PELVIS  FINDINGS No intra-abdominal free air or free fluid. Hepatobiliary: There is a 6.8 x 7.3 cm cyst in the dome of the liver. Additional 3.7 x 2.2 cm heterogeneously enhancing lesion in the posterior right lobe of the liver is not characterized on this CT. Further characterization with MRI without and with contrast recommended. No biliary dilatation. There is sludge or small stones in the gallbladder. No pericholecystic fluid or evidence of acute cholecystitis by CT. Pancreas: Unremarkable. No pancreatic ductal dilatation or surrounding inflammatory changes. Spleen: Normal in size without focal abnormality. Adrenals/Urinary Tract: The adrenal glands are unremarkable. There is no hydronephrosis on either side. There is symmetric enhancement and excretion of contrast by both kidneys. The visualized ureters and urinary bladder appear unremarkable. Stomach/Bowel: There is sigmoid diverticulosis. There is no bowel obstruction or active inflammation. The appendix is normal. Vascular/Lymphatic: Mild aortoiliac atherosclerotic disease. The IVC is unremarkable. No portal venous gas. There is no adenopathy. Reproductive: The prostate and seminal vesicles are grossly remarkable.  Other: Small fat containing umbilical hernia. Musculoskeletal: There is a total left hip arthroplasty. A 10 x 11 cm fluid collection with pockets of air and enhancing walls adjacent to the arthroplasty is may be postsurgical but concerning for an infectious process. Clinical correlation is recommended. There is degenerative changes of the spine. No acute osseous pathology. Review of the MIP images confirms the above findings. IMPRESSION: 1. No acute intrathoracic pathology. No CT evidence of central pulmonary artery embolus. 2. Sigmoid diverticulosis. No bowel obstruction. Normal appendix. 3. A complex periprostatic collection adjacent to the left hip arthroplasty may be postsurgical but concerning for an infectious process. 4. A 3.7 x 2.2 cm heterogeneously enhancing lesion in the posterior right lobe of the liver is not characterized on this CT. Further characterization with MRI without and with contrast recommended. 5. Cholelithiasis. 6.  Aortic Atherosclerosis (ICD10-I70.0). Electronically Signed   By: Vanetta Chou M.D.   On: 02/20/2024 11:29    Scheduled Meds:  sodium chloride    Intravenous Once   amLODipine   10 mg Oral Daily   atorvastatin   20 mg Oral QHS   calcium  carbonate  1 tablet Oral BID WC   docusate sodium   100 mg Oral BID   feeding supplement  237 mL Oral BID BM   ferrous sulfate   325 mg Oral Q breakfast   folic acid   1 mg Oral Daily   metoprolol  tartrate  100 mg Oral BID   pantoprazole  (PROTONIX ) IV  40 mg Intravenous Q12H   senna  1 tablet Oral BID   Continuous Infusions:   ceFAZolin  (ANCEF ) IV 2 g (02/21/24 0833)   lactated ringers  Stopped (02/20/24 2132)   promethazine  (PHENERGAN ) injection (IM or IVPB) Stopped (02/20/24 1608)     LOS: 2 days   Fredia Skeeter, MD [facility]  02/21/2024, 1:29 PM   *Please note that this is a verbal dictation therefore any spelling or grammatical errors are due to the Dragon [employer] One system interpretation.  Please page  via Amion and do not message via secure chat for urgent patient care matters. Secure chat can be used for non urgent patient care matters.  How to contact the TRH Attending or Consulting provider 7A - 7P or covering provider during after hours 7P -7A, for this patient?  Check the care team in [facility] and look for a) attending/consulting TR[facility] provider listed and b) the TR[facility] team listed. Page or secure chat 7A-7P. Log into [email or web address] and use [place]'s universal password to access. If you  do not have the password, please contact the hospital operator. Locate the [facility] provider you are looking for under [facility] and page to a number that you can be directly reached. If you still have difficulty reaching the provider, please page the [facility] (Director on Call) for the Hospitalists listed on amion for assistance.

## 2024-02-21 NOTE — Progress Notes (Signed)
 1615: Consulted with Dr. Gatha of Heme/Onc.  Believes elevated INR/aPTT likely due to vomiting and post-surgical blood loss/state.    Patient received 10mg  Vit K around 8-9am this morning.  Recommend recheck CBC to assess PLTs right now.  Also give one unit of FFP.    Then recommend to recheck CBC, INR, and Vit K tomorrow morning.  If INR > 4-5, then give another 5mg  of Vit K and may come assess him at that point.  Dr. Edna and hospitalist aware.

## 2024-02-21 NOTE — Progress Notes (Signed)
 Date and time results received: 02/21/24 07:25 AM  Test: INR Critical Value: 7.3  Name of Provider Notified: Dr. Edna, Dr. Vernon  Orders Received? Or Actions Taken?: Orders Received - See Orders for details

## 2024-02-21 NOTE — Plan of Care (Signed)

## 2024-02-21 NOTE — Progress Notes (Signed)
     Patient Name: BORIS ENGELMANN           DOB: 23-Nov-1955  MRN: [id number]      Admission Date: 02/18/2024  Attending Provider: Edna Toribio LABOR, MD  Primary Diagnosis: Recurrent dislocation of left hip   Level of care: Med-Surg   OVERNIGHT PROGRESS REPORT   Critical lab INR 7.1.  No active bleeding is reported. Hgb 8.4 after 2 units prbc.  I notified RN to have lab redraw STAT. Labs still pending this morning.    Drelyn Pistilli, DNP, ACNPC- AG [facility]

## 2024-02-21 NOTE — Progress Notes (Signed)
 Critical lab INR 7.1. Paged Abigail Chavez.

## 2024-02-21 NOTE — Progress Notes (Signed)
 3 Days Post-Op Procedure(s) (LRB): IRRIGATION AND DEBRIDEMENT WOUND (Left) APPLICATION, WOUND VAC (Left) CLOSED REDUCTION, HIP  Subjective: Patient reports pain as moderately improved today.  GI to evaluate today given abdominal pain nausea vomiting yesterday.  Hemoglobin improved to 8.4 after 2 units of blood transfusion. INR this morning was 7.1.  Unclear what etiology given that he is only on aspirin  for DVT prophylaxis.  Recheck is still pending.  Spoke with the lab and they are running the recheck right now.   Objective:   VITALS:   Vitals:   02/20/24 1806 02/20/24 1821 02/20/24 2129 02/21/24 0413  BP: 127/65 123/67 123/67 118/64  Pulse: 99 100 (!) 103 98  Resp: 18 16 15 20   Temp: 98.5 F (36.9 C) 98.4 F (36.9 C) 98.2 F (36.8 C) 98.1 F (36.7 C)  TempSrc: Oral Oral Oral   SpO2: (!) 89% 94% 98% 98%  Weight:      Height:        AAOx4, resting in NAD Sensation intact distally Intact pulses distally Dorsiflexion/Plantar flexion intact Incision: dressing C/D/I Compartment soft Wound VAC holding suction 120 mmHg and no output in canister Left thigh tender to palpation, no erythema or swelling or bruising   Lab Results  Component Value Date   WBC 12.3 (H) 02/20/2024   HGB 8.4 (L) 02/21/2024   HCT 27.1 (L) 02/21/2024   MCV 89.5 02/20/2024   PLT 485 (H) 02/20/2024   BMET    Component Value Date/Time   NA 134 (L) 02/19/2024 0330   K 4.9 02/19/2024 0330   CL 92 (L) 02/19/2024 0330   CO2 31 02/19/2024 0330   GLUCOSE 134 (H) 02/19/2024 0330   BUN 11 02/19/2024 0330   CREATININE 0.87 02/19/2024 0330   CALCIUM  7.7 (L) 02/19/2024 0330   GFRNONAA >60 02/19/2024 0330      Xray: Reduced proximal femoral replacement arthroplasty in good alignment without adverse features  Assessment/Plan: 3 Days Post-Op   Principal Problem:   Recurrent dislocation of left hip  Status post closed reduction left hip dislocation and irrigation debridement superficial  wound dehiscence 02/18/24  7/30: Plan for transfusion with Hgb of 6.6.  2 units ordered.  Details of this morning events discussed with nursing staff and rapid response team.  Plan for hospitalist consult to evaluate for post-operative concerns such as pulmonary embolism or bowel obstruction.   23: Spoke with Dr. Roxane of hospitalist team regarding patient case, shared similar concerns.  Plan for further workup likely including CTA.  7/31: Follow-up repeat INR result this morning.  May need intervention if the 7.1 result is felt to be real.  Appreciate input from medicine and GI.  Does have a fluid collection around the left hip likely traumatic hematoma due to the dislocation.  Could be exacerbated by the INR result if real.  Would improve if the INR is corrected.SABRA  Post op recs: WB: WBAT LLE with strict posterior hip precautions Abx: ancef  x23 hours post op, dc on bactrim  Imaging: PACU xrays Dressing: Prevena incisional wound VAC DVT prophylaxis: Aspirin  81 mg Follow up: 1 week after surgery for a wound check with Dr. Edna at [facility].  Address: [address], [place], [place] [address]  Office Phone: [phone]    Kaylee Wombles A Bettie Capistran 02/21/2024, 7:30 AM    Contact information:   Weekdays 7am-5pm epic message Dr. Edna, or call office for patient follow up: [phone] After hours and holidays please check [email or web address]  for group call information for [facility]

## 2024-02-21 NOTE — Progress Notes (Signed)
 Informed by lab of questionable lab results and need for redraw.

## 2024-02-21 NOTE — Consult Note (Signed)
 [facility] Gastroenterology Consult  Referring Provider: No ref. provider found Primary Care Physician:  Stephanie Charlene CROME, MD Primary Gastroenterologist: [place]  Reason for Consultation: Feculent emesis, anemia  SUBJECTIVE:   HPI: Jonathon Anderson is a 68 y.o. male with past medical history significant for hypertension, hyperlipidemia and arthritis.  Underwent left hip revision and wound VAC placement on 02/18/2024.  Began to have downtrend of hemoglobin after surgery (baseline 7-8).  Per report he had feculent emesis which was dark in color. CT scan of abdomen pelvis on 7/31 showed 10 x 11 cm fluid collection with pockets of air and enhancing walls adjacent to arthroplasty, there were no signs of bowel obstruction or ileus. An NG tube was attempted to be placed though unsuccessful. CT angiogram showed a 3.7 x 2.2 cm heterogenously enhancing lesion in the posterior right lobe of the liver, MRI of the abdomen showed this lesion to be a likely hemangioma.  INR was subsequently checked and elevated at 7.3.  On my evaluation of patient today, he denied any abdominal pain.  He noted having some nausea.  He has no longer having emesis. No dysphagia or odynophagia. No chest pain or shortness of breath.  Past Medical History:  Diagnosis Date   Arthritis    Hyperlipidemia    Hypertension    Nicotine dependence    Stroke (HCC)    LIGHT STROKE - NUMBNESS AND TINGLING IN LEFT HAND - 10/2008 APPROX   Past Surgical History:  Procedure Laterality Date   APPLICATION OF WOUND VAC Left 01/09/2024   Procedure: APPLICATION, WOUND VAC;  Surgeon: Edna Toribio LABOR, MD;  Location: [facility];  Service: Orthopedics;  Laterality: Left;   APPLICATION OF WOUND VAC Left 02/18/2024   Procedure: APPLICATION, WOUND VAC;  Surgeon: Edna Toribio LABOR, MD;  Location: [facility];  Service: Orthopedics;  Laterality: Left;   CONVERSION TO TOTAL HIP Left 01/09/2024   Procedure: CONVERSION TO TOTAL HIP;  Surgeon: Edna Toribio LABOR, MD;  Location: [facility];  Service: Orthopedics;  Laterality: Left;   FEMUR IM NAIL Left 07/27/2022   Procedure: REPAIR FEMUR WITH AUTOGRAFT/ RIA;  Surgeon: Celena Sharper, MD;  Location: [facility] OR;  Service: Orthopedics;  Laterality: Left;   HARDWARE REMOVAL Left 06/06/2022   Procedure: HARDWARE REMOVAL;  Surgeon: Celena Sharper, MD;  Location: [facility] OR;  Service: Orthopedics;  Laterality: Left;   HARDWARE REMOVAL Bilateral 07/27/2022   Procedure: REPAIR OF LEFT PROXIMAL FEMUR NONUNION, REMOVAL OF ANTIBIOTIC SPACER, REAMED INTERMEDULLARY ASPRIATE RIGHT FEMUR;  Surgeon: Celena Sharper, MD;  Location: [facility] OR;  Service: Orthopedics;  Laterality: Bilateral;   HIP CLOSED REDUCTION Left 01/19/2024   Procedure: Attempted CLOSED REDUCTION, HIP;  Surgeon: Cristy Bonner DASEN, MD;  Location: [facility] OR;  Service: Orthopedics;  Laterality: Left;   HIP CLOSED REDUCTION  02/18/2024   Procedure: CLOSED REDUCTION, HIP;  Surgeon: Edna Toribio LABOR, MD;  Location: [facility];  Service: Orthopedics;;   INCISION AND DRAINAGE OF WOUND Left 02/18/2024   Procedure: IRRIGATION AND DEBRIDEMENT WOUND;  Surgeon: Edna Toribio LABOR, MD;  Location: [facility];  Service: Orthopedics;  Laterality: Left;   INTRAMEDULLARY (IM) NAIL INTERTROCHANTERIC Left 05/11/2021   Procedure: INTRAMEDULLARY (IM) NAIL INTERTROCHANTRIC OF LEFT SUBTHROCHANTRIC HIP FRACTURE;  Surgeon: Edie Norleen PARAS, MD;  Location: [facility] ORS;  Service: Orthopedics;  Laterality: Left;   ORIF FEMUR FRACTURE Left 06/06/2022   Procedure: OPEN REDUCTION INTERNAL FIXATION FEMORAL PROXIMAL FRACTURE;  Surgeon: Celena Sharper, MD;  Location: [facility] OR;  Service: Orthopedics;  Laterality: Left;  TOTAL HIP REVISION Left 01/21/2024   Procedure: Open Hip Reduction with Local Tissue Advancement;  Surgeon: Edna Toribio LABOR, MD;  Location: [facility] OR;  Service: Orthopedics;  Laterality: Left;   Prior to Admission medications   Medication Sig Start Date End Date Taking? Authorizing Provider  acetaminophen   (TYLENOL ) 500 MG tablet Take 1,000 mg by mouth in the morning and at bedtime.   Yes [provider]  amLODipine  (NORVASC ) 10 MG tablet Take 10 mg by mouth in the morning. 10/23/23  Yes [provider]  aspirin  EC 81 MG tablet Take 1 tablet (81 mg total) by mouth 2 (two) times daily for 28 days. Swallow whole. 02/19/24 03/18/24 Yes Renae Bernarda HERO, PA-C  aspirin  EC 81 MG tablet Take 81 mg by mouth 2 (two) times daily. Swallow whole.   Yes [provider]  atorvastatin  (LIPITOR) 20 MG tablet Take 20 mg by mouth at bedtime. 11/28/20  Yes [provider]  celecoxib  (CELEBREX ) 100 MG capsule Take 1 capsule (100 mg total) by mouth 2 (two) times daily for 14 days. 02/18/24 03/03/24 Yes Renae Bernarda HERO, PA-C  cyclobenzaprine  (FLEXERIL ) 10 MG tablet Take 1 tablet (10 mg total) by mouth every 12 (twelve) hours as needed for muscle spasms. 02/18/24  Yes Renae Bernarda HERO, PA-C  magnesium  hydroxide (MILK OF MAGNESIA) 400 MG/5ML suspension Take 15 mLs by mouth daily as needed (constipation.).   Yes [provider]  Menthol , Topical Analgesic, (BIOFREEZE EX) Apply 1 Application topically daily as needed (pain).   Yes [provider]  metoprolol  tartrate (LOPRESSOR ) 100 MG tablet Take 100 mg by mouth 2 (two) times daily.   Yes [provider]  ondansetron  (ZOFRAN ) 4 MG tablet Take 1 tablet (4 mg total) by mouth every 6 (six) hours as needed for nausea. 01/24/24  Yes Elnora Ip, MD  oxyCODONE  (OXY IR/ROXICODONE ) 5 MG immediate release tablet Take 5 mg by mouth every 4 (four) hours as needed for severe pain (pain score 7-10).   Yes [provider]  oxyCODONE  (ROXICODONE ) 5 MG immediate release tablet Take 1 tablet (5 mg total) by mouth every 4 (four) hours as needed for up to 7 days for severe pain (pain score 7-10) or moderate pain (pain score 4-6). 02/18/24 02/25/24 Yes Renae Bernarda HERO, PA-C  polyethylene glycol (MIRALAX ) 17 g packet  Take 17 g by mouth daily. 02/18/24  Yes Cockerham, Alicia M, PA-C  sulfamethoxazole -trimethoprim  (BACTRIM  DS) 800-160 MG tablet Take 1 tablet by mouth 2 (two) times daily for 14 days. 02/18/24 03/03/24 Yes Renae Bernarda HERO, PA-C   Current Facility-Administered Medications  Medication Dose Route Frequency Provider Last Rate Last Admin   0.9 %  sodium chloride  infusion (Manually program via Guardrails IV Fluids)   Intravenous Once Cockerham, Alicia M, PA-C       acetaminophen  (TYLENOL ) tablet 325-650 mg  325-650 mg Oral Q6H PRN Cockerham, Alicia M, PA-C   650 mg at 02/21/24 1118   amLODipine  (NORVASC ) tablet 10 mg  10 mg Oral Daily Cockerham, Alicia M, PA-C   10 mg at 02/19/24 9072   atorvastatin  (LIPITOR) tablet 20 mg  20 mg Oral QHS Cockerham, Alicia M, PA-C   20 mg at 02/19/24 2141   calcium  carbonate (TUMS - dosed in mg elemental calcium ) chewable tablet 200 mg of elemental calcium   1 tablet Oral BID WC Edna Toribio LABOR, MD   200 mg of elemental calcium  at 02/21/24 1040   ceFAZolin  (ANCEF ) IVPB 2g/100 mL premix  2  g Intravenous Q8H Edna Toribio LABOR, MD 200 mL/hr at 02/21/24 9166 2 g at 02/21/24 9166   diphenhydrAMINE  (BENADRYL ) 12.5 MG/5ML elixir 12.5-25 mg  12.5-25 mg Oral Q4H PRN Cockerham, Alicia M, PA-C       docusate sodium  (COLACE) capsule 100 mg  100 mg Oral BID Cockerham, Alicia M, PA-C   100 mg at 02/21/24 1045   feeding supplement (ENSURE PLUS HIGH PROTEIN) liquid 237 mL  237 mL Oral BID BM Edna Toribio LABOR, MD   237 mL at 02/19/24 9072   ferrous sulfate  tablet 325 mg  325 mg Oral Q breakfast Cockerham, Alicia M, PA-C   325 mg at 02/21/24 1045   folic acid  (FOLVITE ) tablet 1 mg  1 mg Oral Daily Cockerham, Alicia M, PA-C   1 mg at 02/21/24 1045   HYDROmorphone  (DILAUDID ) injection 0.5-1 mg  0.5-1 mg Intravenous Q4H PRN Cockerham, Alicia M, PA-C   1 mg at 02/18/24 2000   lactated ringers  infusion   Intravenous Continuous Cockerham, Alicia M, PA-C 75 mL/hr at 02/21/24 1350 New  Bag at 02/21/24 1350   menthol -cetylpyridinium (CEPACOL) lozenge 3 mg  1 lozenge Oral PRN Cockerham, Alicia M, PA-C       Or   phenol (CHLORASEPTIC) mouth spray 1 spray  1 spray Mouth/Throat PRN Cockerham, Alicia M, PA-C       methocarbamol  (ROBAXIN ) tablet 500 mg  500 mg Oral Q6H PRN Cockerham, Alicia M, PA-C   500 mg at 02/21/24 1118   Or   methocarbamol  (ROBAXIN ) injection 500 mg  500 mg Intravenous Q6H PRN Cockerham, Alicia M, PA-C   500 mg at 02/18/24 1521   metoprolol  tartrate (LOPRESSOR ) tablet 100 mg  100 mg Oral BID Cockerham, Alicia M, PA-C   100 mg at 02/19/24 2140   ondansetron  (ZOFRAN ) tablet 4 mg  4 mg Oral Q6H PRN Cockerham, Alicia M, PA-C   4 mg at 02/21/24 1040   Or   ondansetron  (ZOFRAN ) injection 4 mg  4 mg Intravenous Q6H PRN Cockerham, Alicia M, PA-C   4 mg at 02/20/24 1256   oxyCODONE  (Oxy IR/ROXICODONE ) immediate release tablet 5-10 mg  5-10 mg Oral Q4H PRN Cockerham, Alicia M, PA-C   10 mg at 02/19/24 9461   pantoprazole  (PROTONIX ) injection 40 mg  40 mg Intravenous Q12H Kriss Stagger H, DO   40 mg at 02/21/24 1045   polyethylene glycol (MIRALAX  / GLYCOLAX ) packet 17 g  17 g Oral Daily PRN Cockerham, Alicia M, PA-C       promethazine  (PHENERGAN ) 12.5 mg in sodium chloride  0.9 % 50 mL IVPB  12.5 mg Intravenous Q6H PRN Zella Katha HERO, MD   Stopped at 02/20/24 1608   senna (SENOKOT) tablet 8.6 mg  1 tablet Oral BID Cockerham, Alicia M, PA-C   8.6 mg at 02/20/24 1136   zolpidem  (AMBIEN ) tablet 5 mg  5 mg Oral QHS PRN Cockerham, Alicia M, PA-C       Allergies as of 02/18/2024   (No Known Allergies)   Family History  Problem Relation Age of Onset   Heart attack Mother    Hypertension Brother    Heart failure Paternal Grandfather    Social History   Socioeconomic History   Marital status: Single    Spouse name: Not on file   Number of children: 0   Years of education: Not on file   Highest education level: Not on file  Occupational History   Not on file   Tobacco Use  Smoking status: Former    Current packs/day: 0.00    Types: Cigarettes    Quit date: 06/01/2022    Years since quitting: 1.7   Smokeless tobacco: Never  Vaping Use   Vaping status: Never Used  Substance and Sexual Activity   Alcohol  use: Yes    Comment: occ beer   Drug use: Never   Sexual activity: Not on file  Other Topics Concern   Not on file  Social History Narrative   Not on file   Social Drivers of Health   Financial Resource Strain: Not on file  Food Insecurity: No Food Insecurity (02/18/2024)   Hunger Vital Sign    Worried About Running Out of Food in the Last Year: Never true    Ran Out of Food in the Last Year: Never true  Transportation Needs: No Transportation Needs (02/18/2024)   PRAPARE - Administrator, Civil Service (Medical): No    Lack of Transportation (Non-Medical): No  Physical Activity: Not on file  Stress: Not on file  Social Connections: Moderately Integrated (02/18/2024)   Social Connection and Isolation Panel    Frequency of Communication with Friends and Family: Three times a week    Frequency of Social Gatherings with Friends and Family: Three times a week    Attends Religious Services: More than 4 times per year    Active Member of Clubs or Organizations: Yes    Attends Banker Meetings: More than 4 times per year    Marital Status: Never married  Intimate Partner Violence: Not At Risk (02/18/2024)   Humiliation, Afraid, Rape, and Kick questionnaire    Fear of Current or Ex-Partner: No    Emotionally Abused: No    Physically Abused: No    Sexually Abused: No   Review of Systems:  Review of Systems  Respiratory:  Negative for shortness of breath.   Cardiovascular:  Negative for chest pain.  Gastrointestinal:  Positive for nausea. Negative for abdominal pain and vomiting.    OBJECTIVE:   Temp:  [98.1 F (36.7 C)-98.5 F (36.9 C)] 98.1 F (36.7 C) (07/31 0413) Pulse Rate:  [96-103] 98 (07/31  0413) Resp:  [15-20] 20 (07/31 0413) BP: (118-127)/(64-67) 118/64 (07/31 0413) SpO2:  [89 %-99 %] 98 % (07/31 0413) Last BM Date : 02/20/24 Physical Exam Constitutional:      General: He is not in acute distress. Cardiovascular:     Rate and Rhythm: Normal rate and regular rhythm.  Pulmonary:     Effort: No respiratory distress.     Breath sounds: Normal breath sounds.  Abdominal:     General: Bowel sounds are normal. There is no distension.     Palpations: Abdomen is soft.     Tenderness: There is no abdominal tenderness. There is no guarding.  Neurological:     Mental Status: He is alert.     Labs: Recent Labs    02/19/24 0557 02/20/24 0334 02/20/24 1241 02/21/24 0305 02/21/24 0941  WBC 10.4 12.3*  --   --   --   HGB 6.9* 6.6* 5.5* 8.4* 8.5*  HCT 22.6* 21.4* 18.6* 27.1* 27.5*  PLT 478* 485*  --   --   --    BMET Recent Labs    02/19/24 0330  NA 134*  K 4.9  CL 92*  CO2 31  GLUCOSE 134*  BUN 11  CREATININE 0.87  CALCIUM  7.7*   LFT No results for input(s): PROT, ALBUMIN , AST, ALT, ALKPHOS,  BILITOT, BILIDIR, IBILI in the last 72 hours. PT/INR Recent Labs    02/21/24 0437 02/21/24 1343  LABPROT 65.2* 69.4*  INR 7.3* 7.9*    Diagnostic imaging: MR ABDOMEN W WO CONTRAST Result Date: 02/21/2024 CLINICAL DATA:  Intra-abdominal neoplasm suspected EXAM: MRI ABDOMEN WITHOUT AND WITH CONTRAST TECHNIQUE: Multiplanar multisequence MR imaging of the abdomen was performed both before and after the administration of intravenous contrast. CONTRAST:  10mL GADAVIST  GADOBUTROL  1 MMOL/ML IV SOLN COMPARISON:  CT abdomen pelvis February 20, 2024 FINDINGS: Lower chest: No acute findings. Hepatobiliary: There is a hepatic dome segment 8 T2 hyperintense nonenhancing cyst measuring 7.9 x 6.9 cm. A punctate satellite cyst is also identified measuring 4 mm next to this cyst. There is a nonenhancing T2 hyperintense small cyst measuring 1 cm in sub subcapsular right hepatic  lobe posterior to right hepatic vein (3/22). There is a lobulated lesion measuring 4.6 x 2.6x 4.3 cm in subcapsular posterior aspect of segment 6 with peripheral discontinuous nodular enhancement and gradual filling on delayed phase sequence consistent with hemangioma. These lesions are stable to prior CT. No significant hepatic steatosis. Mild hepatomegaly measuring 18.2 cm in craniocaudal dimension. Gallbladder stones/sludge. Pancreas: No mass, inflammatory changes, or other parenchymal abnormality identified. Mild atrophic changes. Spleen:  Within normal limits in size and appearance. Adrenals/Urinary Tract: Few T2 hyperintense subcentimeter simple cortical cysts in both kidneys. No evidence of hydronephrosis. Normal adrenal glands. Stomach/Bowel: No mass, inflammatory changes, or other parenchymal abnormality identified. Vascular/Lymphatic: No pathologically enlarged lymph nodes identified. No abdominal aortic aneurysm demonstrated. Other: Partial visualization of T2 hyperintense soft tissue changes around the left hip prosthesis better assessed on recent CT. Musculoskeletal: T2 hyperintense lesion in L4 vertebral body likely hemangioma. IMPRESSION: Mild hepatomegaly with multiple liver cysts. A right hepatic lobe lesion with signal characteristics most consistent with hemangioma. Recommend follow-up to ensure stability. Few simple renal cortical cysts which does not require imaging follow-up. L4 lesion likely representing intraosseous hemangioma. Electronically Signed   By: Megan  Zare M.D.   On: 02/21/2024 14:23   CT Angio Chest Pulmonary Embolism (PE) W or WO Contrast Result Date: 02/20/2024 CLINICAL DATA:  Concern for pulmonary embolism and bowel obstruction. EXAM: CT ANGIOGRAPHY CHEST CT ABDOMEN AND PELVIS WITH CONTRAST TECHNIQUE: Multidetector CT imaging of the chest was performed using the standard protocol during bolus administration of intravenous contrast. Multiplanar CT image reconstructions and  MIPs were obtained to evaluate the vascular anatomy. Multidetector CT imaging of the abdomen and pelvis was performed using the standard protocol during bolus administration of intravenous contrast. RADIATION DOSE REDUCTION: This exam was performed according to the departmental dose-optimization program which includes automated exposure control, adjustment of the mA and/or kV according to patient size and/or use of iterative reconstruction technique. CONTRAST:  OMNIPAQUE  IOHEXOL  350 MG/ML SOLN COMPARISON:  None Available. FINDINGS: CTA CHEST FINDINGS Cardiovascular: There is no cardiomegaly or pericardial effusion. The thoracic aorta is unremarkable. The origins of the great vessels of the aortic arch are patent. Evaluation of the pulmonary arteries is limited due to respiratory motion. No central pulmonary artery embolus identified. Mediastinum/Nodes: No hilar or mediastinal adenopathy. The esophagus is grossly unremarkable. No mediastinal fluid collection. Lungs/Pleura: No focal consolidation, pleural effusion, pneumothorax. There are bibasilar atelectasis. The central airways are patent. Musculoskeletal: Degenerative changes of the spine. No acute osseous pathology. Review of the MIP images confirms the above findings. CT ABDOMEN and PELVIS FINDINGS No intra-abdominal free air or free fluid. Hepatobiliary: There is a 6.8 x 7.3 cm  cyst in the dome of the liver. Additional 3.7 x 2.2 cm heterogeneously enhancing lesion in the posterior right lobe of the liver is not characterized on this CT. Further characterization with MRI without and with contrast recommended. No biliary dilatation. There is sludge or small stones in the gallbladder. No pericholecystic fluid or evidence of acute cholecystitis by CT. Pancreas: Unremarkable. No pancreatic ductal dilatation or surrounding inflammatory changes. Spleen: Normal in size without focal abnormality. Adrenals/Urinary Tract: The adrenal glands are unremarkable. There  is no hydronephrosis on either side. There is symmetric enhancement and excretion of contrast by both kidneys. The visualized ureters and urinary bladder appear unremarkable. Stomach/Bowel: There is sigmoid diverticulosis. There is no bowel obstruction or active inflammation. The appendix is normal. Vascular/Lymphatic: Mild aortoiliac atherosclerotic disease. The IVC is unremarkable. No portal venous gas. There is no adenopathy. Reproductive: The prostate and seminal vesicles are grossly remarkable. Other: Small fat containing umbilical hernia. Musculoskeletal: There is a total left hip arthroplasty. A 10 x 11 cm fluid collection with pockets of air and enhancing walls adjacent to the arthroplasty is may be postsurgical but concerning for an infectious process. Clinical correlation is recommended. There is degenerative changes of the spine. No acute osseous pathology. Review of the MIP images confirms the above findings. IMPRESSION: 1. No acute intrathoracic pathology. No CT evidence of central pulmonary artery embolus. 2. Sigmoid diverticulosis. No bowel obstruction. Normal appendix. 3. A complex periprostatic collection adjacent to the left hip arthroplasty may be postsurgical but concerning for an infectious process. 4. A 3.7 x 2.2 cm heterogeneously enhancing lesion in the posterior right lobe of the liver is not characterized on this CT. Further characterization with MRI without and with contrast recommended. 5. Cholelithiasis. 6.  Aortic Atherosclerosis (ICD10-I70.0). Electronically Signed   By: Vanetta Chou M.D.   On: 02/20/2024 11:29   CT ABDOMEN PELVIS W CONTRAST Result Date: 02/20/2024 CLINICAL DATA:  Concern for pulmonary embolism and bowel obstruction. EXAM: CT ANGIOGRAPHY CHEST CT ABDOMEN AND PELVIS WITH CONTRAST TECHNIQUE: Multidetector CT imaging of the chest was performed using the standard protocol during bolus administration of intravenous contrast. Multiplanar CT image reconstructions and  MIPs were obtained to evaluate the vascular anatomy. Multidetector CT imaging of the abdomen and pelvis was performed using the standard protocol during bolus administration of intravenous contrast. RADIATION DOSE REDUCTION: This exam was performed according to the departmental dose-optimization program which includes automated exposure control, adjustment of the mA and/or kV according to patient size and/or use of iterative reconstruction technique. CONTRAST:  OMNIPAQUE  IOHEXOL  350 MG/ML SOLN COMPARISON:  None Available. FINDINGS: CTA CHEST FINDINGS Cardiovascular: There is no cardiomegaly or pericardial effusion. The thoracic aorta is unremarkable. The origins of the great vessels of the aortic arch are patent. Evaluation of the pulmonary arteries is limited due to respiratory motion. No central pulmonary artery embolus identified. Mediastinum/Nodes: No hilar or mediastinal adenopathy. The esophagus is grossly unremarkable. No mediastinal fluid collection. Lungs/Pleura: No focal consolidation, pleural effusion, pneumothorax. There are bibasilar atelectasis. The central airways are patent. Musculoskeletal: Degenerative changes of the spine. No acute osseous pathology. Review of the MIP images confirms the above findings. CT ABDOMEN and PELVIS FINDINGS No intra-abdominal free air or free fluid. Hepatobiliary: There is a 6.8 x 7.3 cm cyst in the dome of the liver. Additional 3.7 x 2.2 cm heterogeneously enhancing lesion in the posterior right lobe of the liver is not characterized on this CT. Further characterization with MRI without and with contrast recommended. No biliary  dilatation. There is sludge or small stones in the gallbladder. No pericholecystic fluid or evidence of acute cholecystitis by CT. Pancreas: Unremarkable. No pancreatic ductal dilatation or surrounding inflammatory changes. Spleen: Normal in size without focal abnormality. Adrenals/Urinary Tract: The adrenal glands are unremarkable. There  is no hydronephrosis on either side. There is symmetric enhancement and excretion of contrast by both kidneys. The visualized ureters and urinary bladder appear unremarkable. Stomach/Bowel: There is sigmoid diverticulosis. There is no bowel obstruction or active inflammation. The appendix is normal. Vascular/Lymphatic: Mild aortoiliac atherosclerotic disease. The IVC is unremarkable. No portal venous gas. There is no adenopathy. Reproductive: The prostate and seminal vesicles are grossly remarkable. Other: Small fat containing umbilical hernia. Musculoskeletal: There is a total left hip arthroplasty. A 10 x 11 cm fluid collection with pockets of air and enhancing walls adjacent to the arthroplasty is may be postsurgical but concerning for an infectious process. Clinical correlation is recommended. There is degenerative changes of the spine. No acute osseous pathology. Review of the MIP images confirms the above findings. IMPRESSION: 1. No acute intrathoracic pathology. No CT evidence of central pulmonary artery embolus. 2. Sigmoid diverticulosis. No bowel obstruction. Normal appendix. 3. A complex periprostatic collection adjacent to the left hip arthroplasty may be postsurgical but concerning for an infectious process. 4. A 3.7 x 2.2 cm heterogeneously enhancing lesion in the posterior right lobe of the liver is not characterized on this CT. Further characterization with MRI without and with contrast recommended. 5. Cholelithiasis. 6.  Aortic Atherosclerosis (ICD10-I70.0). Electronically Signed   By: Vanetta Chou M.D.   On: 02/20/2024 11:29   IMPRESSION: Acute on chronic anemia Supratherapeutic INR Feculent emesis Chronic nausea  PLAN: -Suspect drop in Hgb largely driven by fluid/blood collection near left hip prosthesis especially given supratherapeutic INR -Additional contribution to anemia could be from GI source, though would see coffee ground emesis/hematemesis rather than feculent emesis  -He  appears to be tolerating secretions and very small amounts of oral intake with liquids arguing against ileus/obstruction -Would medically treat supratherapeutic INR -Would provide empiric antiemetics, diet as tolerated -If anemia persists despite correction of INR and signs of upper GI bleeding (melena, hematemesis, coffee ground emesis), can consider EGD -[facility] will follow   LOS: 2 days   Estefana Keas, [facility] Gastroenterology

## 2024-02-21 NOTE — Plan of Care (Signed)
  Problem: Health Behavior/Discharge Planning: Goal: Ability to manage health-related needs will improve Outcome: Not Progressing   Problem: Clinical Measurements: Goal: Ability to maintain clinical measurements within normal limits will improve Outcome: Not Progressing Goal: Diagnostic test results will improve Outcome: Not Progressing   Problem: Activity: Goal: Risk for activity intolerance will decrease Outcome: Not Progressing   Problem: Nutrition: Goal: Adequate nutrition will be maintained Outcome: Not Progressing   Problem: Activity: Goal: Ability to tolerate increased activity will improve Outcome: Not Progressing

## 2024-02-21 NOTE — Progress Notes (Signed)
 PT Cancellation Note  Patient Details Name: Jonathon Anderson MRN: [id number] DOB: 05/05/1956   Cancelled Treatment:    Reason Eval/Treat Not Completed: Medical issues which prohibited therapy (Pt with INR 7.3.  MD asked PT to HOLD.  Will check back at later date.)   Stephane JULIANNA Bevel 02/21/2024, 10:08 AM Laurajean Hosek M,PT Acute Rehab Services [phone]

## 2024-02-22 DIAGNOSIS — M24452 Recurrent dislocation, left hip: Secondary | ICD-10-CM | POA: Diagnosis not present

## 2024-02-22 LAB — CBC
HCT: 24.2 % — ABNORMAL LOW (ref 39.0–52.0)
Hemoglobin: 7.4 g/dL — ABNORMAL LOW (ref 13.0–17.0)
MCH: 27.1 pg (ref 26.0–34.0)
MCHC: 30.6 g/dL (ref 30.0–36.0)
MCV: 88.6 fL (ref 80.0–100.0)
Platelets: 340 K/uL (ref 150–400)
RBC: 2.73 MIL/uL — ABNORMAL LOW (ref 4.22–5.81)
RDW: 16.2 % — ABNORMAL HIGH (ref 11.5–15.5)
WBC: 8.1 K/uL (ref 4.0–10.5)
nRBC: 0 % (ref 0.0–0.2)

## 2024-02-22 LAB — BPAM FFP
Blood Product Expiration Date: 202508052359
ISSUE DATE / TIME: 202507311800
Unit Type and Rh: 9500

## 2024-02-22 LAB — PREPARE FRESH FROZEN PLASMA

## 2024-02-22 LAB — PROTIME-INR
INR: 6.1 (ref 0.8–1.2)
INR: 1.4 — ABNORMAL HIGH (ref 0.8–1.2)
Prothrombin Time: 57 s — ABNORMAL HIGH (ref 11.4–15.2)
Prothrombin Time: 18 s — ABNORMAL HIGH (ref 11.4–15.2)

## 2024-02-22 MED ORDER — VITAMIN K1 10 MG/ML IJ SOLN
5.0000 mg | Freq: Once | INTRAVENOUS | Status: AC
  Administered 2024-02-22: 5 mg via INTRAVENOUS
  Filled 2024-02-22: qty 0.5

## 2024-02-22 MED ORDER — CEFAZOLIN SODIUM-DEXTROSE 2-4 GM/100ML-% IV SOLN
2.0000 g | Freq: Three times a day (TID) | INTRAVENOUS | Status: DC
  Administered 2024-02-22 – 2024-02-27 (×15): 2 g via INTRAVENOUS
  Filled 2024-02-22 (×15): qty 100

## 2024-02-22 NOTE — Evaluation (Signed)
 Physical Therapy Evaluation Patient Details Name: Jonathon Anderson MRN: [id number] DOB: July 21, 1956 Today's Date: 02/22/2024  History of Present Illness  68 yo male  Closed reduction left total hip arthroplasty, left hip wound debridement, Application of Kerecis graft, application of incisional wound VAC    PMH: multiple hip dislocations, S/p L closed reduction L hip attempt in OR 6/28, not successful; s/p L open hip reduction 6/30. On 6/18, S/p conversion to L THA with posterior hip precautions given L intertrochanteric femur fx nonunion, recurrent L hip surgery since 2023, HTN, HLD, TIA, CVA with residual LUE n/t.  Clinical Impression  Pt admitted as above and presenting with functional mobility limitations 2* generalized weakness, post op pain, posterior THP, and decreased activity tolerance.  This date, pt requiring significant assist of two to perform supine<>sit and maintain THP.  Standing deferred for safety 2* continued elevation of INR - 6.1 on most recent test.  Pt hopes to progress to dc home with family assist.        If plan is discharge home, recommend the following: A lot of help with walking and/or transfers;A lot of help with bathing/dressing/bathroom;Assistance with cooking/housework;Help with stairs or ramp for entrance;Assist for transportation   Can travel by private vehicle        Equipment Recommendations None recommended by PT  Recommendations for Other Services       Functional Status Assessment Patient has had a recent decline in their functional status and demonstrates the ability to make significant improvements in function in a reasonable and predictable amount of time.     Precautions / Restrictions Precautions Precautions: Posterior Hip Precaution Booklet Issued: Yes (comment) Recall of Precautions/Restrictions: Intact Precaution/Restrictions Comments: wound vac Restrictions Weight Bearing Restrictions Per Provider Order: Yes LLE Weight Bearing Per  Provider Order: Weight bearing as tolerated      Mobility  Bed Mobility Overal bed mobility: Needs Assistance Bed Mobility: Supine to Sit, Sit to Supine     Supine to sit: Mod assist, +2 for physical assistance, +2 for safety/equipment, Used rails Sit to supine: Mod assist, +2 for physical assistance, +2 for safety/equipment, HOB elevated   General bed mobility comments: Increased time, use of bed pad to assist. Cues for use of R LE to self assist and adherence to THP    Transfers                   General transfer comment: Deferred for safety 2* elevated INR    Ambulation/Gait                  Stairs            Wheelchair Mobility     Tilt Bed    Modified Rankin (Stroke Patients Only)       Balance Overall balance assessment: Needs assistance Sitting-balance support: Single extremity supported, Feet supported Sitting balance-Leahy Scale: Fair                                       Pertinent Vitals/Pain Pain Assessment Pain Assessment: 0-10 Pain Score: 6  Pain Location: L hip Pain Descriptors / Indicators: Aching, Sore, Operative site guarding Pain Intervention(s): Limited activity within patient's tolerance, Monitored during session, Ice applied, Patient requesting pain meds-RN notified, RN gave pain meds during session    Home Living Family/patient expects to be discharged to:: Private residence Living Arrangements: Other relatives Available Help  at Discharge: Family;Available 24 hours/day Type of Home: Mobile home Home Access: Stairs to enter Entrance Stairs-Rails: Right;Left Entrance Stairs-Number of Steps: 6   Home Layout: One level Home Equipment: Control and instrumentation engineer (2 wheels);Cane - single point;Adaptive equipment Additional Comments: planning to discharge to sister's house    Prior Function Prior Level of Function : Independent/Modified Independent;Driving             Mobility Comments:  using RW PTA since previous surgery ADLs Comments: He was independent with ADLs and driving.     Extremity/Trunk Assessment   Upper Extremity Assessment Upper Extremity Assessment: Generalized weakness    Lower Extremity Assessment Lower Extremity Assessment: LLE deficits/detail    Cervical / Trunk Assessment Cervical / Trunk Assessment: Other exceptions Cervical / Trunk Exceptions: obese  Communication   Communication Communication: Impaired Factors Affecting Communication: Hearing impaired;Other (comment)    Cognition Arousal: Alert Behavior During Therapy: WFL for tasks assessed/performed   PT - Cognitive impairments: No apparent impairments                       PT - Cognition Comments: AxO x 3 pleasant and following all commands.  Slow to move and easily fatigues.  Progressing.  Pt admits to a Sedentary Lifestyle and Tobacco use. Following commands: Intact       Cueing Cueing Techniques: Verbal cues, Gestural cues     General Comments      Exercises     Assessment/Plan    PT Assessment Patient needs continued PT services  PT Problem List Decreased strength;Decreased range of motion;Decreased activity tolerance;Decreased balance;Decreased mobility;Decreased knowledge of use of DME;Pain;Obesity       PT Treatment Interventions DME instruction;Gait training;Stair training;Functional mobility training;Therapeutic activities;Therapeutic exercise;Balance training;Patient/family education    PT Goals (Current goals can be found in the Care Plan section)  Acute Rehab PT Goals Patient Stated Goal: Regain IND PT Goal Formulation: With patient Time For Goal Achievement: 01/17/24 Potential to Achieve Goals: Fair    Frequency 7X/week     Co-evaluation               AM-PAC PT 6 Clicks Mobility  Outcome Measure Help needed turning from your back to your side while in a flat bed without using bedrails?: A Lot Help needed moving from lying on  your back to sitting on the side of a flat bed without using bedrails?: A Lot Help needed moving to and from a bed to a chair (including a wheelchair)?: A Lot Help needed standing up from a chair using your arms (e.g., wheelchair or bedside chair)?: Total Help needed to walk in hospital room?: Total Help needed climbing 3-5 steps with a railing? : Total 6 Click Score: 9    End of Session Equipment Utilized During Treatment: Gait belt Activity Tolerance: Patient tolerated treatment well Patient left: in bed;with call bell/phone within reach;with bed alarm set;with nursing/sitter in room Nurse Communication: Mobility status PT Visit Diagnosis: Difficulty in walking, not elsewhere classified (R26.2)    Time: 1325-1402 PT Time Calculation (min) (ACUTE ONLY): 37 min   Charges:   PT Evaluation $PT Eval Low Complexity: 1 Low PT Treatments $Therapeutic Activity: 8-22 mins PT General Charges $$ ACUTE PT VISIT: 1 Visit         [facility] PT Acute Rehabilitation Services Office [phone]   Breeley Bischof 02/22/2024, 4:03 PM

## 2024-02-22 NOTE — Consult Note (Signed)
 [facility]  [phone]   HEMATOLOGY ONCOLOGY INPATIENT CONSULTATION   Jonathon Anderson  DOB: Apr 18, 1956  MR#: [id number]  CSN#: [id number]    Requesting Physician: [facility]  Patient Care Team: Stephanie Charlene LITTIE, MD as PCP - General (Family Medicine)  Reason for consult: elevated PT/APTT  History of present illness:   Patient is a 68 year old male with past medical history of hypertension, dyslipidemia, status post recent left hip replacement, was admitted by orthopedic surgery for closed reduction irrigation and debridement of left hip arthroplasty, now status post wound VAC placement on July 28.  He was found to have significantly elevated PT/INR, and slightly elevated APTT also.  He had some bleeding from the surgical site, but no other bruising or bleeding.  He has received a few doses of vitamin K .  Previous PTT was normal.  Patient denies any anticoagulation after the hip replacement in June.  MEDICAL HISTORY:  Past Medical History:  Diagnosis Date   Arthritis    Hyperlipidemia    Hypertension    Nicotine dependence    Stroke (HCC)    LIGHT STROKE - NUMBNESS AND TINGLING IN LEFT HAND - 10/2008 APPROX    SURGICAL HISTORY: Past Surgical History:  Procedure Laterality Date   APPLICATION OF WOUND VAC Left 01/09/2024   Procedure: APPLICATION, WOUND VAC;  Surgeon: Edna Toribio LABOR, MD;  Location: [facility];  Service: Orthopedics;  Laterality: Left;   APPLICATION OF WOUND VAC Left 02/18/2024   Procedure: APPLICATION, WOUND VAC;  Surgeon: Edna Toribio LABOR, MD;  Location: [facility];  Service: Orthopedics;  Laterality: Left;   CONVERSION TO TOTAL HIP Left 01/09/2024   Procedure: CONVERSION TO TOTAL HIP;  Surgeon: Edna Toribio LABOR, MD;  Location: [facility];  Service: Orthopedics;  Laterality: Left;   FEMUR IM NAIL Left 07/27/2022   Procedure: REPAIR FEMUR WITH AUTOGRAFT/ RIA;  Surgeon: Celena Sharper, MD;  Location: [facility] OR;  Service: Orthopedics;   Laterality: Left;   HARDWARE REMOVAL Left 06/06/2022   Procedure: HARDWARE REMOVAL;  Surgeon: Celena Sharper, MD;  Location: [facility];  Service: Orthopedics;  Laterality: Left;   HARDWARE REMOVAL Bilateral 07/27/2022   Procedure: REPAIR OF LEFT PROXIMAL FEMUR NONUNION, REMOVAL OF ANTIBIOTIC SPACER, REAMED INTERMEDULLARY ASPRIATE RIGHT FEMUR;  Surgeon: Celena Sharper, MD;  Location: [facility] OR;  Service: Orthopedics;  Laterality: Bilateral;   HIP CLOSED REDUCTION Left 01/19/2024   Procedure: Attempted CLOSED REDUCTION, HIP;  Surgeon: Cristy Bonner DASEN, MD;  Location: [facility] OR;  Service: Orthopedics;  Laterality: Left;   HIP CLOSED REDUCTION  02/18/2024   Procedure: CLOSED REDUCTION, HIP;  Surgeon: Edna Toribio LABOR, MD;  Location: [facility];  Service: Orthopedics;;   INCISION AND DRAINAGE OF WOUND Left 02/18/2024   Procedure: IRRIGATION AND DEBRIDEMENT WOUND;  Surgeon: Edna Toribio LABOR, MD;  Location: [facility];  Service: Orthopedics;  Laterality: Left;   INTRAMEDULLARY (IM) NAIL INTERTROCHANTERIC Left 05/11/2021   Procedure: INTRAMEDULLARY (IM) NAIL INTERTROCHANTRIC OF LEFT SUBTHROCHANTRIC HIP FRACTURE;  Surgeon: Edie Norleen PARAS, MD;  Location: [facility];  Service: Orthopedics;  Laterality: Left;   ORIF FEMUR FRACTURE Left 06/06/2022   Procedure: OPEN REDUCTION INTERNAL FIXATION FEMORAL PROXIMAL FRACTURE;  Surgeon: Celena Sharper, MD;  Location: [facility] OR;  Service: Orthopedics;  Laterality: Left;   TOTAL HIP REVISION Left 01/21/2024   Procedure: Open Hip Reduction with Local Tissue Advancement;  Surgeon: Edna Toribio LABOR, MD;  Location: [facility] OR;  Service: Orthopedics;  Laterality: Left;    SOCIAL HISTORY: Social History  Socioeconomic History   Marital status: Single    Spouse name: Not on file   Number of children: 0   Years of education: Not on file   Highest education level: Not on file  Occupational History   Not on file  Tobacco Use   Smoking status: Former    Current packs/day: 0.00    Types:  Cigarettes    Quit date: 06/01/2022    Years since quitting: 1.7   Smokeless tobacco: Never  Vaping Use   Vaping status: Never Used  Substance and Sexual Activity   Alcohol  use: Yes    Comment: occ beer   Drug use: Never   Sexual activity: Not on file  Other Topics Concern   Not on file  Social History Narrative   Not on file   Social Drivers of Health   Financial Resource Strain: Not on file  Food Insecurity: No Food Insecurity (02/18/2024)   Hunger Vital Sign    Worried About Running Out of Food in the Last Year: Never true    Ran Out of Food in the Last Year: Never true  Transportation Needs: No Transportation Needs (02/18/2024)   PRAPARE - Administrator, Civil Service (Medical): No    Lack of Transportation (Non-Medical): No  Physical Activity: Not on file  Stress: Not on file  Social Connections: Moderately Integrated (02/18/2024)   Social Connection and Isolation Panel    Frequency of Communication with Friends and Family: Three times a week    Frequency of Social Gatherings with Friends and Family: Three times a week    Attends Religious Services: More than 4 times per year    Active Member of Clubs or Organizations: Yes    Attends Banker Meetings: More than 4 times per year    Marital Status: Never married  Intimate Partner Violence: Not At Risk (02/18/2024)   Humiliation, Afraid, Rape, and Kick questionnaire    Fear of Current or Ex-Partner: No    Emotionally Abused: No    Physically Abused: No    Sexually Abused: No    FAMILY HISTORY: Family History  Problem Relation Age of Onset   Heart attack Mother    Hypertension Brother    Heart failure Paternal Grandfather     ALLERGIES:  has no known allergies.  MEDICATIONS:  Current Facility-Administered Medications  Medication Dose Route Frequency Provider Last Rate Last Admin   acetaminophen  (TYLENOL ) tablet 325-650 mg  325-650 mg Oral Q6H PRN Cockerham, Alicia M, PA-C   650 mg at  02/21/24 1118   amLODipine  (NORVASC ) tablet 10 mg  10 mg Oral Daily Cockerham, Alicia M, PA-C   10 mg at 02/19/24 9072   atorvastatin  (LIPITOR) tablet 20 mg  20 mg Oral QHS Cockerham, Alicia M, PA-C   20 mg at 02/21/24 2117   calcium  carbonate (TUMS - dosed in mg elemental calcium ) chewable tablet 200 mg of elemental calcium   1 tablet Oral BID WC Edna Toribio LABOR, MD   200 mg of elemental calcium  at 02/22/24 1119   ceFAZolin  (ANCEF ) IVPB 2g/100 mL premix  2 g Intravenous Q8H Cockerham, Alicia M, PA-C   Stopped at 02/22/24 1421   diphenhydrAMINE  (BENADRYL ) 12.5 MG/5ML elixir 12.5-25 mg  12.5-25 mg Oral Q4H PRN Cockerham, Alicia M, PA-C       docusate sodium  (COLACE) capsule 100 mg  100 mg Oral BID Cockerham, Alicia M, PA-C   100 mg at 02/22/24 1119   feeding supplement (ENSURE PLUS  HIGH PROTEIN) liquid 237 mL  237 mL Oral BID BM Edna Toribio LABOR, MD   237 mL at 02/19/24 9072   ferrous sulfate  tablet 325 mg  325 mg Oral Q breakfast Cockerham, Alicia M, PA-C   325 mg at 02/22/24 1119   folic acid  (FOLVITE ) tablet 1 mg  1 mg Oral Daily Cockerham, Alicia M, PA-C   1 mg at 02/22/24 1119   HYDROmorphone  (DILAUDID ) injection 0.5-1 mg  0.5-1 mg Intravenous Q4H PRN Cockerham, Alicia M, PA-C   1 mg at 02/18/24 2000   lactated ringers  infusion   Intravenous Continuous Cockerham, Alicia M, PA-C 75 mL/hr at 02/22/24 1523 Infusion Verify at 02/22/24 1523   menthol -cetylpyridinium (CEPACOL) lozenge 3 mg  1 lozenge Oral PRN Cockerham, Alicia M, PA-C       Or   phenol (CHLORASEPTIC) mouth spray 1 spray  1 spray Mouth/Throat PRN Cockerham, Alicia M, PA-C       methocarbamol  (ROBAXIN ) tablet 500 mg  500 mg Oral Q6H PRN Cockerham, Alicia M, PA-C   500 mg at 02/22/24 1339   Or   methocarbamol  (ROBAXIN ) injection 500 mg  500 mg Intravenous Q6H PRN Cockerham, Alicia M, PA-C   500 mg at 02/18/24 1521   metoprolol  tartrate (LOPRESSOR ) tablet 100 mg  100 mg Oral BID Cockerham, Alicia M, PA-C   100 mg at 02/21/24 2118    ondansetron  (ZOFRAN ) tablet 4 mg  4 mg Oral Q6H PRN Cockerham, Alicia M, PA-C   4 mg at 02/22/24 1339   Or   ondansetron  (ZOFRAN ) injection 4 mg  4 mg Intravenous Q6H PRN Cockerham, Alicia M, PA-C   4 mg at 02/20/24 1256   oxyCODONE  (Oxy IR/ROXICODONE ) immediate release tablet 5-10 mg  5-10 mg Oral Q4H PRN Cockerham, Alicia M, PA-C   10 mg at 02/22/24 1339   pantoprazole  (PROTONIX ) injection 40 mg  40 mg Intravenous Q12H Kriss Stagger H, DO   40 mg at 02/22/24 1122   polyethylene glycol (MIRALAX  / GLYCOLAX ) packet 17 g  17 g Oral Daily PRN Cockerham, Alicia M, PA-C       promethazine  (PHENERGAN ) 12.5 mg in sodium chloride  0.9 % 50 mL IVPB  12.5 mg Intravenous Q6H PRN Zella Katha HERO, MD   Stopped at 02/20/24 1608   senna (SENOKOT) tablet 8.6 mg  1 tablet Oral BID Cockerham, Alicia M, PA-C   8.6 mg at 02/22/24 1119   zolpidem  (AMBIEN ) tablet 5 mg  5 mg Oral QHS PRN Cockerham, Alicia M, PA-C        REVIEW OF SYSTEMS:   Constitutional: Denies fevers, chills or abnormal night sweats Eyes: Denies blurriness of vision, double vision or watery eyes Ears, nose, mouth, throat, and face: Denies mucositis or sore throat Respiratory: Denies cough, dyspnea or wheezes Cardiovascular: Denies palpitation, chest discomfort or lower extremity swelling Gastrointestinal:  Denies nausea, heartburn or change in bowel habits Skin: Denies abnormal skin rashes Lymphatics: Denies new lymphadenopathy or easy bruising Neurological:Denies numbness, tingling or new weaknesses Behavioral/Psych: Mood is stable, no new changes  All other systems were reviewed with the patient and are negative.  PHYSICAL EXAMINATION:   Vitals:   02/22/24 0728 02/22/24 1321  BP: 119/67 138/65  Pulse: 97   Resp: 18 17  Temp: 98.9 F (37.2 C) 98.2 F (36.8 C)  SpO2: 99% 95%   Filed Weights   02/18/24 0910 02/18/24 1047  Weight: 222 lb 10.6 oz (101 kg) 222 lb 10.6 oz (101 kg)  GENERAL:alert, no distress and  comfortable SKIN: skin color, texture, turgor are normal, no rashes or significant lesions, wound vac at left hip area  EYES: normal, conjunctiva are pink and non-injected, sclera clear OROPHARYNX:no exudate, no erythema and lips, buccal mucosa, and tongue normal  NECK: supple, thyroid normal size, non-tender, without nodularity LYMPH:  no palpable lymphadenopathy in the cervical, axillary or inguinal LUNGS: clear to auscultation and percussion with normal breathing effort HEART: regular rate & rhythm and no murmurs and no lower extremity edema ABDOMEN:abdomen soft, non-tender and normal bowel sounds Musculoskeletal:no cyanosis of digits and no clubbing  PSYCH: alert & oriented x 3 with fluent speech NEURO: no focal motor/sensory deficits  LABORATORY DATA:  I have reviewed the data as listed Lab Results  Component Value Date   WBC 8.1 02/22/2024   HGB 7.4 (L) 02/22/2024   HCT 24.2 (L) 02/22/2024   MCV 88.6 02/22/2024   PLT 340 02/22/2024   Recent Labs    01/02/24 0933 01/09/24 1410 01/19/24 1046 01/20/24 0912 01/21/24 0500 01/21/24 1657 01/24/24 0755 02/18/24 1045 02/19/24 0330  NA 135   < > 133*   < > 130*   < > 129* 135 134*  K 3.5   < > 3.2*   < > 3.5   < > 4.0 4.6 4.9  CL 100   < > 96*   < > 93*   < > 91* 95* 92*  CO2 26   < > 30   < > 30   < > 29 28 31   GLUCOSE 114*   < > 121*   < > 111*   < > 108* 96 134*  BUN 6*   < > <5*   < > <5*   < > <5* 11 11  CREATININE 0.82   < > 0.66   < > 0.65   < > 0.75 0.82 0.87  CALCIUM  8.4*   < > 8.2*   < > 7.5*   < > 7.7* 8.2* 7.7*  GFRNONAA >60   < > >60   < > >60   < > >60 >60 >60  PROT 7.7  --  6.2*  --   --   --   --  6.9  --   ALBUMIN  3.5  --  2.5*  --  2.2*  --   --  2.1*  --   AST 15  --  21  --   --   --   --  13*  --   ALT 8  --  12  --   --   --   --  8  --   ALKPHOS 118  --  94  --   --   --   --  131*  --   BILITOT 0.6  --  0.5  --   --   --   --  1.0  --    < > = values in this interval not displayed.     RADIOGRAPHIC STUDIES: I have personally reviewed the radiological images as listed and agreed with the findings in the report. MR ABDOMEN W WO CONTRAST Result Date: 02/21/2024 CLINICAL DATA:  Intra-abdominal neoplasm suspected EXAM: MRI ABDOMEN WITHOUT AND WITH CONTRAST TECHNIQUE: Multiplanar multisequence MR imaging of the abdomen was performed both before and after the administration of intravenous contrast. CONTRAST:  10mL GADAVIST  GADOBUTROL  1 MMOL/ML IV SOLN COMPARISON:  CT abdomen pelvis February 20, 2024 FINDINGS: Lower chest: No acute findings. Hepatobiliary:  There is a hepatic dome segment 8 T2 hyperintense nonenhancing cyst measuring 7.9 x 6.9 cm. A punctate satellite cyst is also identified measuring 4 mm next to this cyst. There is a nonenhancing T2 hyperintense small cyst measuring 1 cm in sub subcapsular right hepatic lobe posterior to right hepatic vein (3/22). There is a lobulated lesion measuring 4.6 x 2.6x 4.3 cm in subcapsular posterior aspect of segment 6 with peripheral discontinuous nodular enhancement and gradual filling on delayed phase sequence consistent with hemangioma. These lesions are stable to prior CT. No significant hepatic steatosis. Mild hepatomegaly measuring 18.2 cm in craniocaudal dimension. Gallbladder stones/sludge. Pancreas: No mass, inflammatory changes, or other parenchymal abnormality identified. Mild atrophic changes. Spleen:  Within normal limits in size and appearance. Adrenals/Urinary Tract: Few T2 hyperintense subcentimeter simple cortical cysts in both kidneys. No evidence of hydronephrosis. Normal adrenal glands. Stomach/Bowel: No mass, inflammatory changes, or other parenchymal abnormality identified. Vascular/Lymphatic: No pathologically enlarged lymph nodes identified. No abdominal aortic aneurysm demonstrated. Other: Partial visualization of T2 hyperintense soft tissue changes around the left hip prosthesis better assessed on recent CT. Musculoskeletal: T2  hyperintense lesion in L4 vertebral body likely hemangioma. IMPRESSION: Mild hepatomegaly with multiple liver cysts. A right hepatic lobe lesion with signal characteristics most consistent with hemangioma. Recommend follow-up to ensure stability. Few simple renal cortical cysts which does not require imaging follow-up. L4 lesion likely representing intraosseous hemangioma. Electronically Signed   By: Megan  Zare M.D.   On: 02/21/2024 14:23   CT Angio Chest Pulmonary Embolism (PE) W or WO Contrast Result Date: 02/20/2024 CLINICAL DATA:  Concern for pulmonary embolism and bowel obstruction. EXAM: CT ANGIOGRAPHY CHEST CT ABDOMEN AND PELVIS WITH CONTRAST TECHNIQUE: Multidetector CT imaging of the chest was performed using the standard protocol during bolus administration of intravenous contrast. Multiplanar CT image reconstructions and MIPs were obtained to evaluate the vascular anatomy. Multidetector CT imaging of the abdomen and pelvis was performed using the standard protocol during bolus administration of intravenous contrast. RADIATION DOSE REDUCTION: This exam was performed according to the departmental dose-optimization program which includes automated exposure control, adjustment of the mA and/or kV according to patient size and/or use of iterative reconstruction technique. CONTRAST:  OMNIPAQUE  IOHEXOL  350 MG/ML SOLN COMPARISON:  None Available. FINDINGS: CTA CHEST FINDINGS Cardiovascular: There is no cardiomegaly or pericardial effusion. The thoracic aorta is unremarkable. The origins of the great vessels of the aortic arch are patent. Evaluation of the pulmonary arteries is limited due to respiratory motion. No central pulmonary artery embolus identified. Mediastinum/Nodes: No hilar or mediastinal adenopathy. The esophagus is grossly unremarkable. No mediastinal fluid collection. Lungs/Pleura: No focal consolidation, pleural effusion, pneumothorax. There are bibasilar atelectasis. The central airways  are patent. Musculoskeletal: Degenerative changes of the spine. No acute osseous pathology. Review of the MIP images confirms the above findings. CT ABDOMEN and PELVIS FINDINGS No intra-abdominal free air or free fluid. Hepatobiliary: There is a 6.8 x 7.3 cm cyst in the dome of the liver. Additional 3.7 x 2.2 cm heterogeneously enhancing lesion in the posterior right lobe of the liver is not characterized on this CT. Further characterization with MRI without and with contrast recommended. No biliary dilatation. There is sludge or small stones in the gallbladder. No pericholecystic fluid or evidence of acute cholecystitis by CT. Pancreas: Unremarkable. No pancreatic ductal dilatation or surrounding inflammatory changes. Spleen: Normal in size without focal abnormality. Adrenals/Urinary Tract: The adrenal glands are unremarkable. There is no hydronephrosis on either side. There is symmetric enhancement  and excretion of contrast by both kidneys. The visualized ureters and urinary bladder appear unremarkable. Stomach/Bowel: There is sigmoid diverticulosis. There is no bowel obstruction or active inflammation. The appendix is normal. Vascular/Lymphatic: Mild aortoiliac atherosclerotic disease. The IVC is unremarkable. No portal venous gas. There is no adenopathy. Reproductive: The prostate and seminal vesicles are grossly remarkable. Other: Small fat containing umbilical hernia. Musculoskeletal: There is a total left hip arthroplasty. A 10 x 11 cm fluid collection with pockets of air and enhancing walls adjacent to the arthroplasty is may be postsurgical but concerning for an infectious process. Clinical correlation is recommended. There is degenerative changes of the spine. No acute osseous pathology. Review of the MIP images confirms the above findings. IMPRESSION: 1. No acute intrathoracic pathology. No CT evidence of central pulmonary artery embolus. 2. Sigmoid diverticulosis. No bowel obstruction. Normal appendix.  3. A complex periprostatic collection adjacent to the left hip arthroplasty may be postsurgical but concerning for an infectious process. 4. A 3.7 x 2.2 cm heterogeneously enhancing lesion in the posterior right lobe of the liver is not characterized on this CT. Further characterization with MRI without and with contrast recommended. 5. Cholelithiasis. 6.  Aortic Atherosclerosis (ICD10-I70.0). Electronically Signed   By: Vanetta Chou M.D.   On: 02/20/2024 11:29   CT ABDOMEN PELVIS W CONTRAST Result Date: 02/20/2024 CLINICAL DATA:  Concern for pulmonary embolism and bowel obstruction. EXAM: CT ANGIOGRAPHY CHEST CT ABDOMEN AND PELVIS WITH CONTRAST TECHNIQUE: Multidetector CT imaging of the chest was performed using the standard protocol during bolus administration of intravenous contrast. Multiplanar CT image reconstructions and MIPs were obtained to evaluate the vascular anatomy. Multidetector CT imaging of the abdomen and pelvis was performed using the standard protocol during bolus administration of intravenous contrast. RADIATION DOSE REDUCTION: This exam was performed according to the departmental dose-optimization program which includes automated exposure control, adjustment of the mA and/or kV according to patient size and/or use of iterative reconstruction technique. CONTRAST:  OMNIPAQUE  IOHEXOL  350 MG/ML SOLN COMPARISON:  None Available. FINDINGS: CTA CHEST FINDINGS Cardiovascular: There is no cardiomegaly or pericardial effusion. The thoracic aorta is unremarkable. The origins of the great vessels of the aortic arch are patent. Evaluation of the pulmonary arteries is limited due to respiratory motion. No central pulmonary artery embolus identified. Mediastinum/Nodes: No hilar or mediastinal adenopathy. The esophagus is grossly unremarkable. No mediastinal fluid collection. Lungs/Pleura: No focal consolidation, pleural effusion, pneumothorax. There are bibasilar atelectasis. The central airways  are patent. Musculoskeletal: Degenerative changes of the spine. No acute osseous pathology. Review of the MIP images confirms the above findings. CT ABDOMEN and PELVIS FINDINGS No intra-abdominal free air or free fluid. Hepatobiliary: There is a 6.8 x 7.3 cm cyst in the dome of the liver. Additional 3.7 x 2.2 cm heterogeneously enhancing lesion in the posterior right lobe of the liver is not characterized on this CT. Further characterization with MRI without and with contrast recommended. No biliary dilatation. There is sludge or small stones in the gallbladder. No pericholecystic fluid or evidence of acute cholecystitis by CT. Pancreas: Unremarkable. No pancreatic ductal dilatation or surrounding inflammatory changes. Spleen: Normal in size without focal abnormality. Adrenals/Urinary Tract: The adrenal glands are unremarkable. There is no hydronephrosis on either side. There is symmetric enhancement and excretion of contrast by both kidneys. The visualized ureters and urinary bladder appear unremarkable. Stomach/Bowel: There is sigmoid diverticulosis. There is no bowel obstruction or active inflammation. The appendix is normal. Vascular/Lymphatic: Mild aortoiliac atherosclerotic disease. The IVC is  unremarkable. No portal venous gas. There is no adenopathy. Reproductive: The prostate and seminal vesicles are grossly remarkable. Other: Small fat containing umbilical hernia. Musculoskeletal: There is a total left hip arthroplasty. A 10 x 11 cm fluid collection with pockets of air and enhancing walls adjacent to the arthroplasty is may be postsurgical but concerning for an infectious process. Clinical correlation is recommended. There is degenerative changes of the spine. No acute osseous pathology. Review of the MIP images confirms the above findings. IMPRESSION: 1. No acute intrathoracic pathology. No CT evidence of central pulmonary artery embolus. 2. Sigmoid diverticulosis. No bowel obstruction. Normal appendix.  3. A complex periprostatic collection adjacent to the left hip arthroplasty may be postsurgical but concerning for an infectious process. 4. A 3.7 x 2.2 cm heterogeneously enhancing lesion in the posterior right lobe of the liver is not characterized on this CT. Further characterization with MRI without and with contrast recommended. 5. Cholelithiasis. 6.  Aortic Atherosclerosis (ICD10-I70.0). Electronically Signed   By: Vanetta Chou M.D.   On: 02/20/2024 11:29   DG HIP UNILAT W OR W/O PELVIS 2-3 VIEWS LEFT Result Date: 02/18/2024 CLINICAL DATA:  Postop reduction EXAM: DG HIP (WITH OR WITHOUT PELVIS) 2-3V LEFT COMPARISON:  02/18/2024 FINDINGS: Interval reduction of the previously seen dislocated left hip prosthesis. Normal alignment. No visible acute fracture. IMPRESSION: Interval reduction. Electronically Signed   By: Franky Crease M.D.   On: 02/18/2024 17:39   DG HIP UNILAT WITH PELVIS 2-3 VIEWS LEFT Result Date: 02/18/2024 CLINICAL DATA:  Dislocated left hip prosthesis EXAM: DG HIP (WITH OR WITHOUT PELVIS) 2-3V LEFT COMPARISON:  02/18/2024 FINDINGS: Multiple intraoperative images initially demonstrate dislocated left hip prosthesis. Final images demonstrate reduction. IMPRESSION: Intraoperative imaging for reduction of the dislocated left hip prosthesis. Electronically Signed   By: Franky Crease M.D.   On: 02/18/2024 17:38   DG Pelvis Portable Result Date: 02/18/2024 CLINICAL DATA:  Left hip instability preoperative EXAM: PORTABLE PELVIS 1-2 VIEWS COMPARISON:  January 18, 2024 FINDINGS: Status post left hip total arthroplasty with superolateral dislocation of the left femoral component surgical clips superimposing the right femoral trochanter. No acute osseous fracture. Soft tissues are unremarkable. He she high, Similar to prior radiograph. IMPRESSION: Superolateral dislocation of left hip arthroplasty femoral component. Electronically Signed   By: Megan  Zare M.D.   On: 02/18/2024 14:53   DG C-Arm  1-60 Min-No Report Result Date: 02/18/2024 Fluoroscopy was utilized by the requesting physician.  No radiographic interpretation.   DG C-Arm 1-60 Min-No Report Result Date: 02/18/2024 Fluoroscopy was utilized by the requesting physician.  No radiographic interpretation.    ASSESSMENT & PLAN:  68 yo male s/p left total hip replacement in June 2025, was readmitted second time after surgery for wound infection issue  Elevated PT/INR and APTT, unclear etiology Anemia secondary to left hip surgery  recurrent dislocation of left hip Liver cysts and hemangioma, no evidence of liver cirrhosis  Recommendations: - I agree with blood transfusion to keep hemoglobin above 7 - Unclear why his PT and APTT elevated significantly, he did have significant bleeding at the left hip surgical site, no other signs of bleeding. - I will order him PT and APTT mixing study to rule out antibodies - Agree with vitamin K  and monitor PT/INR daily -If he has significant bleeding, consider FFP to reverse INR - I will follow-up in a few days.  All questions were answered. The patient knows to call the clinic with any problems, questions or concerns.  Onita Mattock, MD 02/22/2024

## 2024-02-22 NOTE — Plan of Care (Signed)
  Problem: Education: Goal: Knowledge of General Education information will improve Description: Including pain rating scale, medication(s)/side effects and non-pharmacologic comfort measures Outcome: Progressing   Problem: Clinical Measurements: Goal: Ability to maintain clinical measurements within normal limits will improve Outcome: Progressing Goal: Respiratory complications will improve Outcome: Progressing   Problem: Activity: Goal: Risk for activity intolerance will decrease Outcome: Progressing   Problem: Coping: Goal: Level of anxiety will decrease Outcome: Progressing   Problem: Pain Managment: Goal: General experience of comfort will improve and/or be controlled Outcome: Progressing

## 2024-02-22 NOTE — Progress Notes (Signed)
 PROGRESS NOTE    KLAYTON MONIE  FMW:[id number] DOB: 01/25/1956 DOA: 02/18/2024 PCP: Stephanie Charlene LITTIE, MD   Brief Narrative:  Jonathon Anderson is a 68 y.o. male with medical history significant for light stroke on ASA BID, HTN, HLD admitted to the hospital by the orthopedic service for closed reduction irrigation and debridement of left hip arthroplasty now status post wound VAC placement on 7/28 and hospitalist service/TRH was consulted 02/20/2024 due to hypoxia, anemia and vomiting. Patient endorsed some mild abdominal discomfort.  He denied any cough or shortness of breath.  Hemoglobin fallen to 6.6 , no evidence of any kind of blood loss, he denied any bright red blood in his emesis, or any blood in his stools.  He denied any prior history of GI bleeding.  Assessment & Plan:   Principal Problem:   Recurrent dislocation of left hip  Status post closed reduction left hip dislocation and irrigation debridement superficial wound dehiscence 02/18/24 With evidence of complex periprosthetic collection adjacent to the left hip arthroplasty which may be either concerning for infectious or hematoma.  Will defer to primary service.  Acute hypoxic respiratory failure:Was likely due to severe acute anemia.  Out of concern of PE, consulting hospitalist ordered CT angiogram of the chest which ruled out PE or any other lung pathology.  Acute blood loss anemia: Patient's hemoglobin preoperatively was 9.5, dropped significantly postoperatively to 5.5 on 02/20/2024 for which patient received 2 units of PRBC transfusion, aspirin  discontinued, hemoglobin 8.5 posttransfusion but continues to drop and down to 7.4 today.  FOBT was ordered which is still pending. patient started on Protonix  twice daily.  GI consulted by primary service due to concern of GI bleed.  However per GI, anemia is likely secondary to bleeding at the surgical site/at hip as seen on the CT scan especially in the setting of supratherapeutic  INR.  No hematemesis or hematochezia reports.  Supratherapeutic INR/multiple liver cysts and hemangioma: As a part of workup for anemia and hypoxia, hospitalist ordered CT abdomen pelvis which showed incidental finding of A 3.7 x 2.2 cm heterogeneously enhancing lesion in the posterior right lobe of the liver is not characterized on this CT. Further characterization with MRI without and with contrast recommended.  MRI abdomen shows multiple liver cysts. A right hepatic lobe lesion with signal characteristics most consistent with hemangioma. Recommend follow-up to ensure stability.  Patient received vitamin K  yesterday for INR of 7.1.  I recommended consulting with heme-onc.  Primary service did consult with them and they recommended giving more vitamin K  and heme-onc will see patient today.  INR is worse today at 7.9 and primary service is giving patient another dose of vitamin K .  Essential hypertension: Controlled.  Continue amlodipine .  Chronic mild hyponatremia: Baseline around 130, at baseline now.  DVT prophylaxis: SCDs Start: 02/18/24 1634 Place TED hose Start: 02/18/24 1634   Code Status: Full Code  Family Communication:  None present at bedside.  Plan of care discussed with patient in length and he/she verbalized understanding and agreed with it.  Status is: Inpatient Remains inpatient appropriate because: Disposition per primary service   Estimated body mass index is 34.87 kg/m as calculated from the following:   Height as of this encounter: 5' 7 (1.702 m).   Weight as of this encounter: 101 kg.    Nutritional Assessment: Body mass index is 34.87 kg/m.SABRA Seen by dietician.  I agree with the assessment and plan as outlined below: Nutrition Status:        .  Skin Assessment: I have examined the patient's skin and I agree with the wound assessment as performed by the wound care RN as outlined below:    Consultants:  TRH and GI  Procedures:  As above  Antimicrobials:   Anti-infectives (From admission, onward)    Start     Dose/Rate Route Frequency Ordered Stop   02/19/24 0800  ceFAZolin  (ANCEF ) IVPB 2g/100 mL premix        2 g 200 mL/hr over 30 Minutes Intravenous Every 8 hours 02/19/24 0548 02/21/24 2352   02/18/24 1800  ceFAZolin  (ANCEF ) IVPB 2g/100 mL premix        2 g 200 mL/hr over 30 Minutes Intravenous Every 6 hours 02/18/24 1633 02/18/24 2359   02/18/24 1305  vancomycin  (VANCOCIN ) powder  Status:  Discontinued          As needed 02/18/24 1305 02/18/24 1409   02/18/24 1045  ceFAZolin  (ANCEF ) IVPB 2g/100 mL premix        2 g 200 mL/hr over 30 Minutes Intravenous On call to O.R. 02/18/24 1043 02/18/24 1233   02/18/24 0000  sulfamethoxazole -trimethoprim  (BACTRIM  DS) 800-160 MG tablet        1 tablet Oral 2 times daily 02/18/24 1014 03/03/24 2359         Subjective: Seen and examined.  No complaints at all.  Objective: Vitals:   02/21/24 1824 02/21/24 2025 02/21/24 2123 02/22/24 0728  BP: 139/84 133/65 133/80 119/67  Pulse: (!) 101 98 (!) 108 97  Resp: 18 17 14 18   Temp: 98.7 F (37.1 C) 98.5 F (36.9 C) 98.2 F (36.8 C) 98.9 F (37.2 C)  TempSrc: Oral Oral  Oral  SpO2: 99% 98% 99% 99%  Weight:      Height:        Intake/Output Summary (Last 24 hours) at 02/22/2024 0828 Last data filed at 02/22/2024 0600 Gross per 24 hour  Intake 982.06 ml  Output 1400 ml  Net -417.94 ml   Filed Weights   02/18/24 0910 02/18/24 1047  Weight: 101 kg 101 kg    Examination:  General exam: Appears calm and comfortable  Respiratory system: Clear to auscultation. Respiratory effort normal. Cardiovascular system: S1 & S2 heard, RRR. No JVD, murmurs, rubs, gallops or clicks. No pedal edema. Gastrointestinal system: Abdomen is nondistended, soft and nontender. No organomegaly or masses felt. Normal bowel sounds heard. Central nervous system: Alert and oriented. No focal neurological deficits. Extremities: Symmetric 5 x 5 power. Skin: No rashes,  lesions or ulcers Psychiatry: Judgement and insight appear normal. Mood & affect appropriate.    Data Reviewed: I have personally reviewed following labs and imaging studies  CBC: Recent Labs  Lab 02/18/24 1045 02/19/24 0557 02/20/24 0334 02/20/24 1241 02/21/24 0305 02/21/24 0941 02/21/24 1650 02/22/24 0617  WBC 10.5 10.4 12.3*  --   --   --  9.4 8.1  NEUTROABS 9.1*  --   --   --   --   --  7.4  --   HGB 9.5* 6.9* 6.6* 5.5* 8.4* 8.5* 7.5* 7.4*  HCT 31.4* 22.6* 21.4* 18.6* 27.1* 27.5* 23.6* 24.2*  MCV 90.2 89.7 89.5  --   --   --  86.1 88.6  PLT 558* 478* 485*  --   --   --  344 340   Basic Metabolic Panel: Recent Labs  Lab 02/18/24 1045 02/19/24 0330  NA 135 134*  K 4.6 4.9  CL 95* 92*  CO2 28 31  GLUCOSE 96  134*  BUN 11 11  CREATININE 0.82 0.87  CALCIUM  8.2* 7.7*   GFR: Estimated Creatinine Clearance: 90.8 mL/min (by C-G formula based on SCr of 0.87 mg/dL). Liver Function Tests: Recent Labs  Lab 02/18/24 1045  AST 13*  ALT 8  ALKPHOS 131*  BILITOT 1.0  PROT 6.9  ALBUMIN  2.1*   No results for input(s): LIPASE, AMYLASE in the last 168 hours. No results for input(s): AMMONIA in the last 168 hours. Coagulation Profile: Recent Labs  Lab 02/21/24 0305 02/21/24 0437 02/21/24 1343  INR 7.1* 7.3* 7.9*   Cardiac Enzymes: No results for input(s): CKTOTAL, CKMB, CKMBINDEX, TROPONINI in the last 168 hours. BNP (last 3 results) No results for input(s): PROBNP in the last 8760 hours. HbA1C: No results for input(s): HGBA1C in the last 72 hours. CBG: Recent Labs  Lab 02/20/24 0700  GLUCAP 121*   Lipid Profile: No results for input(s): CHOL, HDL, LDLCALC, TRIG, CHOLHDL, LDLDIRECT in the last 72 hours. Thyroid Function Tests: No results for input(s): TSH, T4TOTAL, FREET4, T3FREE, THYROIDAB in the last 72 hours. Anemia Panel: No results for input(s): VITAMINB12, FOLATE, FERRITIN, TIBC, IRON , RETICCTPCT in  the last 72 hours. Sepsis Labs: No results for input(s): PROCALCITON, LATICACIDVEN in the last 168 hours.  Recent Results (from the past 240 hours)  Aerobic/Anaerobic Culture w Gram Stain (surgical/deep wound)     Status: None (Preliminary result)   Collection Time: 02/18/24  1:03 PM   Specimen: Soft Tissue, Other  Result Value Ref Range Status   Specimen Description   Final    TISSUE Performed at [facility], [address], [place], [place] [address]    Special Requests   Final    NONE Performed at [facility], [address], [place], [place] [address]    Gram Stain   Final    NO WBC SEEN NO ORGANISMS SEEN Performed at [facility], [address], [place], [place] [address]    Culture   Final    RARE STAPHYLOCOCCUS AUREUS NO ANAEROBES ISOLATED; CULTURE IN PROGRESS FOR 5 DAYS    Report Status PENDING  Incomplete  Aerobic/Anaerobic Culture w Gram Stain (surgical/deep wound)     Status: None (Preliminary result)   Collection Time: 02/18/24  1:03 PM   Specimen: Soft Tissue, Other  Result Value Ref Range Status   Specimen Description   Final    TISSUE Performed at [facility], [address] [place], [place] [address]    Special Requests   Final    NONE Performed at [facility], [address] [place], [place] [address]    Gram Stain   Final    NO WBC SEEN NO ORGANISMS SEEN Performed at [facility], [address] [place], [place] [address]    Culture   Final    RARE STAPHYLOCOCCUS AUREUS SUSCEPTIBILITIES TO FOLLOW NO ANAEROBES ISOLATED; CULTURE IN PROGRESS FOR 5 DAYS    Report Status PENDING  Incomplete     Radiology Studies: MR ABDOMEN W WO CONTRAST Result Date: 02/21/2024 CLINICAL DATA:  Intra-abdominal neoplasm suspected EXAM: MRI ABDOMEN WITHOUT AND WITH CONTRAST TECHNIQUE: Multiplanar multisequence MR imaging of the abdomen was performed both before and after the administration of  intravenous contrast. CONTRAST:  10mL GADAVIST  GADOBUTROL  1 MMOL/ML IV SOLN COMPARISON:  CT abdomen pelvis February 20, 2024 FINDINGS: Lower chest: No acute findings. Hepatobiliary: There is a hepatic dome segment 8 T2 hyperintense nonenhancing cyst measuring  7.9 x 6.9 cm. A punctate satellite cyst is also identified measuring 4 mm next to this cyst. There is a nonenhancing T2 hyperintense small cyst measuring 1 cm in sub subcapsular right hepatic lobe posterior to right hepatic vein (3/22). There is a lobulated lesion measuring 4.6 x 2.6x 4.3 cm in subcapsular posterior aspect of segment 6 with peripheral discontinuous nodular enhancement and gradual filling on delayed phase sequence consistent with hemangioma. These lesions are stable to prior CT. No significant hepatic steatosis. Mild hepatomegaly measuring 18.2 cm in craniocaudal dimension. Gallbladder stones/sludge. Pancreas: No mass, inflammatory changes, or other parenchymal abnormality identified. Mild atrophic changes. Spleen:  Within normal limits in size and appearance. Adrenals/Urinary Tract: Few T2 hyperintense subcentimeter simple cortical cysts in both kidneys. No evidence of hydronephrosis. Normal adrenal glands. Stomach/Bowel: No mass, inflammatory changes, or other parenchymal abnormality identified. Vascular/Lymphatic: No pathologically enlarged lymph nodes identified. No abdominal aortic aneurysm demonstrated. Other: Partial visualization of T2 hyperintense soft tissue changes around the left hip prosthesis better assessed on recent CT. Musculoskeletal: T2 hyperintense lesion in L4 vertebral body likely hemangioma. IMPRESSION: Mild hepatomegaly with multiple liver cysts. A right hepatic lobe lesion with signal characteristics most consistent with hemangioma. Recommend follow-up to ensure stability. Few simple renal cortical cysts which does not require imaging follow-up. L4 lesion likely representing intraosseous hemangioma. Electronically Signed    By: Megan  Zare M.D.   On: 02/21/2024 14:23   CT Angio Chest Pulmonary Embolism (PE) W or WO Contrast Result Date: 02/20/2024 CLINICAL DATA:  Concern for pulmonary embolism and bowel obstruction. EXAM: CT ANGIOGRAPHY CHEST CT ABDOMEN AND PELVIS WITH CONTRAST TECHNIQUE: Multidetector CT imaging of the chest was performed using the standard protocol during bolus administration of intravenous contrast. Multiplanar CT image reconstructions and MIPs were obtained to evaluate the vascular anatomy. Multidetector CT imaging of the abdomen and pelvis was performed using the standard protocol during bolus administration of intravenous contrast. RADIATION DOSE REDUCTION: This exam was performed according to the departmental dose-optimization program which includes automated exposure control, adjustment of the mA and/or kV according to patient size and/or use of iterative reconstruction technique. CONTRAST:  OMNIPAQUE  IOHEXOL  350 MG/ML SOLN COMPARISON:  None Available. FINDINGS: CTA CHEST FINDINGS Cardiovascular: There is no cardiomegaly or pericardial effusion. The thoracic aorta is unremarkable. The origins of the great vessels of the aortic arch are patent. Evaluation of the pulmonary arteries is limited due to respiratory motion. No central pulmonary artery embolus identified. Mediastinum/Nodes: No hilar or mediastinal adenopathy. The esophagus is grossly unremarkable. No mediastinal fluid collection. Lungs/Pleura: No focal consolidation, pleural effusion, pneumothorax. There are bibasilar atelectasis. The central airways are patent. Musculoskeletal: Degenerative changes of the spine. No acute osseous pathology. Review of the MIP images confirms the above findings. CT ABDOMEN and PELVIS FINDINGS No intra-abdominal free air or free fluid. Hepatobiliary: There is a 6.8 x 7.3 cm cyst in the dome of the liver. Additional 3.7 x 2.2 cm heterogeneously enhancing lesion in the posterior right lobe of the liver is not  characterized on this CT. Further characterization with MRI without and with contrast recommended. No biliary dilatation. There is sludge or small stones in the gallbladder. No pericholecystic fluid or evidence of acute cholecystitis by CT. Pancreas: Unremarkable. No pancreatic ductal dilatation or surrounding inflammatory changes. Spleen: Normal in size without focal abnormality. Adrenals/Urinary Tract: The adrenal glands are unremarkable. There is no hydronephrosis on either side. There is symmetric enhancement and excretion of contrast by both kidneys. The visualized ureters and urinary  bladder appear unremarkable. Stomach/Bowel: There is sigmoid diverticulosis. There is no bowel obstruction or active inflammation. The appendix is normal. Vascular/Lymphatic: Mild aortoiliac atherosclerotic disease. The IVC is unremarkable. No portal venous gas. There is no adenopathy. Reproductive: The prostate and seminal vesicles are grossly remarkable. Other: Small fat containing umbilical hernia. Musculoskeletal: There is a total left hip arthroplasty. A 10 x 11 cm fluid collection with pockets of air and enhancing walls adjacent to the arthroplasty is may be postsurgical but concerning for an infectious process. Clinical correlation is recommended. There is degenerative changes of the spine. No acute osseous pathology. Review of the MIP images confirms the above findings. IMPRESSION: 1. No acute intrathoracic pathology. No CT evidence of central pulmonary artery embolus. 2. Sigmoid diverticulosis. No bowel obstruction. Normal appendix. 3. A complex periprostatic collection adjacent to the left hip arthroplasty may be postsurgical but concerning for an infectious process. 4. A 3.7 x 2.2 cm heterogeneously enhancing lesion in the posterior right lobe of the liver is not characterized on this CT. Further characterization with MRI without and with contrast recommended. 5. Cholelithiasis. 6.  Aortic Atherosclerosis (ICD10-I70.0).  Electronically Signed   By: Vanetta Chou M.D.   On: 02/20/2024 11:29   CT ABDOMEN PELVIS W CONTRAST Result Date: 02/20/2024 CLINICAL DATA:  Concern for pulmonary embolism and bowel obstruction. EXAM: CT ANGIOGRAPHY CHEST CT ABDOMEN AND PELVIS WITH CONTRAST TECHNIQUE: Multidetector CT imaging of the chest was performed using the standard protocol during bolus administration of intravenous contrast. Multiplanar CT image reconstructions and MIPs were obtained to evaluate the vascular anatomy. Multidetector CT imaging of the abdomen and pelvis was performed using the standard protocol during bolus administration of intravenous contrast. RADIATION DOSE REDUCTION: This exam was performed according to the departmental dose-optimization program which includes automated exposure control, adjustment of the mA and/or kV according to patient size and/or use of iterative reconstruction technique. CONTRAST:  OMNIPAQUE  IOHEXOL  350 MG/ML SOLN COMPARISON:  None Available. FINDINGS: CTA CHEST FINDINGS Cardiovascular: There is no cardiomegaly or pericardial effusion. The thoracic aorta is unremarkable. The origins of the great vessels of the aortic arch are patent. Evaluation of the pulmonary arteries is limited due to respiratory motion. No central pulmonary artery embolus identified. Mediastinum/Nodes: No hilar or mediastinal adenopathy. The esophagus is grossly unremarkable. No mediastinal fluid collection. Lungs/Pleura: No focal consolidation, pleural effusion, pneumothorax. There are bibasilar atelectasis. The central airways are patent. Musculoskeletal: Degenerative changes of the spine. No acute osseous pathology. Review of the MIP images confirms the above findings. CT ABDOMEN and PELVIS FINDINGS No intra-abdominal free air or free fluid. Hepatobiliary: There is a 6.8 x 7.3 cm cyst in the dome of the liver. Additional 3.7 x 2.2 cm heterogeneously enhancing lesion in the posterior right lobe of the liver is not  characterized on this CT. Further characterization with MRI without and with contrast recommended. No biliary dilatation. There is sludge or small stones in the gallbladder. No pericholecystic fluid or evidence of acute cholecystitis by CT. Pancreas: Unremarkable. No pancreatic ductal dilatation or surrounding inflammatory changes. Spleen: Normal in size without focal abnormality. Adrenals/Urinary Tract: The adrenal glands are unremarkable. There is no hydronephrosis on either side. There is symmetric enhancement and excretion of contrast by both kidneys. The visualized ureters and urinary bladder appear unremarkable. Stomach/Bowel: There is sigmoid diverticulosis. There is no bowel obstruction or active inflammation. The appendix is normal. Vascular/Lymphatic: Mild aortoiliac atherosclerotic disease. The IVC is unremarkable. No portal venous gas. There is no adenopathy. Reproductive: The prostate  and seminal vesicles are grossly remarkable. Other: Small fat containing umbilical hernia. Musculoskeletal: There is a total left hip arthroplasty. A 10 x 11 cm fluid collection with pockets of air and enhancing walls adjacent to the arthroplasty is may be postsurgical but concerning for an infectious process. Clinical correlation is recommended. There is degenerative changes of the spine. No acute osseous pathology. Review of the MIP images confirms the above findings. IMPRESSION: 1. No acute intrathoracic pathology. No CT evidence of central pulmonary artery embolus. 2. Sigmoid diverticulosis. No bowel obstruction. Normal appendix. 3. A complex periprostatic collection adjacent to the left hip arthroplasty may be postsurgical but concerning for an infectious process. 4. A 3.7 x 2.2 cm heterogeneously enhancing lesion in the posterior right lobe of the liver is not characterized on this CT. Further characterization with MRI without and with contrast recommended. 5. Cholelithiasis. 6.  Aortic Atherosclerosis (ICD10-I70.0).  Electronically Signed   By: Vanetta Chou M.D.   On: 02/20/2024 11:29    Scheduled Meds:  sodium chloride    Intravenous Once   amLODipine   10 mg Oral Daily   atorvastatin   20 mg Oral QHS   calcium  carbonate  1 tablet Oral BID WC   docusate sodium   100 mg Oral BID   feeding supplement  237 mL Oral BID BM   ferrous sulfate   325 mg Oral Q breakfast   folic acid   1 mg Oral Daily   metoprolol  tartrate  100 mg Oral BID   pantoprazole  (PROTONIX ) IV  40 mg Intravenous Q12H   senna  1 tablet Oral BID   Continuous Infusions:  lactated ringers  75 mL/hr at 02/21/24 1350   promethazine  (PHENERGAN ) injection (IM or IVPB) Stopped (02/20/24 1608)     LOS: 3 days   Fredia Skeeter, MD [facility]  02/22/2024, 8:28 AM   *Please note that this is a verbal dictation therefore any spelling or grammatical errors are due to the Dragon [employer] One system interpretation.  Please page via Amion and do not message via secure chat for urgent patient care matters. Secure chat can be used for non urgent patient care matters.  How to contact the [facility] Attending or Consulting provider 7A - 7P or covering provider during after hours 7P -7A, for this patient?  Check the care team in [facility] and look for a) attending/consulting [facility] provider listed and b) the [facility] team listed. Page or secure chat 7A-7P. Log into [email or web address] and use [place]'s universal password to access. If you do not have the password, please contact the hospital operator. Locate the [facility] provider you are looking for under [facility] and page to a number that you can be directly reached. If you still have difficulty reaching the provider, please page the [facility] (Director on Call) for the Hospitalists listed on amion for assistance.

## 2024-02-22 NOTE — Progress Notes (Signed)
 4 Days Post-Op Procedure(s) (LRB): IRRIGATION AND DEBRIDEMENT WOUND (Left) APPLICATION, WOUND VAC (Left) CLOSED REDUCTION, HIP  Subjective: Patient reports pain as mild to moderate of surgical leg.  Still not feeling great in general.  Denies numbness or tingling.  Denies shortness of breath or chest pain.  Decreased appetite, limited PO intake.  NG tube was unsuccessful however N/V has improved some.  Mixed statements regarding abdominal pain.  7/30 received 2 units PRBCs, 7/31 Hgb up to 8.4 after 2 units of blood transfusion, then back down to 7.5 around 1700.  INR worsened yesterday to 7.9.  Received 10mg  IV Vit K and 1 unite FFP, recheck of Vit K, CBC and INR pending.     Objective:   VITALS:   Vitals:   02/21/24 1807 02/21/24 1824 02/21/24 2025 02/21/24 2123  BP: 138/68 139/84 133/65 133/80  Pulse: 99 (!) 101 98 (!) 108  Resp: 19 18 17 14   Temp: 98.4 F (36.9 C) 98.7 F (37.1 C) 98.5 F (36.9 C) 98.2 F (36.8 C)  TempSrc: Oral Oral Oral   SpO2: 98% 99% 98% 99%  Weight:      Height:        AAOx4, resting in NAD Sensation intact distally Intact pulses distally Dorsiflexion/Plantar flexion intact Incision: dressing C/D/I Compartment soft Wound VAC holding suction 120 mmHg and no output in canister Wiggles toes appropriately   Lab Results  Component Value Date   WBC 8.1 02/22/2024   HGB 7.4 (L) 02/22/2024   HCT 24.2 (L) 02/22/2024   MCV 88.6 02/22/2024   PLT 340 02/22/2024   BMET    Component Value Date/Time   NA 134 (L) 02/19/2024 0330   K 4.9 02/19/2024 0330   CL 92 (L) 02/19/2024 0330   CO2 31 02/19/2024 0330   GLUCOSE 134 (H) 02/19/2024 0330   BUN 11 02/19/2024 0330   CREATININE 0.87 02/19/2024 0330   CALCIUM  7.7 (L) 02/19/2024 0330   GFRNONAA >60 02/19/2024 0330      Xray: Reduced proximal femoral replacement arthroplasty in good alignment without adverse features  Assessment/Plan: 4 Days Post-Op   Principal Problem:   Recurrent  dislocation of left hip  Status post closed reduction left hip dislocation and irrigation debridement superficial wound dehiscence 02/18/24  7/30: Plan for transfusion with Hgb of 6.6.  2 units ordered.  Details of this morning events discussed with nursing staff and rapid response team.  Plan for hospitalist consult to evaluate for post-operative concerns such as pulmonary embolism or bowel obstruction.   34: Spoke with Dr. Roxane of hospitalist team regarding patient case, shared similar concerns.  Plan for further workup likely including CTA.  7/31: Follow-up repeat INR result this morning.  May need intervention if the 7.1 result is felt to be real.  Appreciate input from medicine and GI.  Does have a fluid collection around the left hip likely traumatic hematoma due to the dislocation.  Could be exacerbated by the INR result if real.  Would improve if the INR is corrected..  8/1: Still unclear etiology so far, however consulted with general surgery, GI, and heme/onc (see separate notes).  May give another 5mg  Vit K IV if INR still > 4-5 per Heme/onc recommendation.  GI symptoms have lessened but still unclear whether he's having abdominal pain, patient poor historian.  PLTs okay yesterday.  Hgb downtrending again yesterday PM.  May reach back out to Heme/Onc depending on pending labs.  Discussed plan with patient at length.  Post op recs: WB: WBAT LLE with strict posterior hip precautions Abx: ancef  x23 hours post op, dc on bactrim  Imaging: PACU xrays Dressing: Prevena incisional wound VAC DVT prophylaxis: Aspirin  81 mg Follow up: 1 week after surgery for a wound check with Dr. Edna at [facility].  Address: [address], [place], [place] [address]  Office Phone: [phone]    Bernarda CHRISTELLA Mclean 02/22/2024, 6:54 AM    Contact information:   Weekdays 7am-5pm epic message Dr. Edna, or call office for patient follow up: [phone] After hours  and holidays please check [email or web address] for group call information for [facility]

## 2024-02-22 NOTE — Progress Notes (Signed)
 [facility] Progress Note  SUBJECTIVE:   Interval history: Jonathon Anderson was seen and evaluated today at bedside.  Resting comfortably in bed.  No chest pain, shortness of breath, nausea or vomiting, abdominal pain.  No recent bowel movement.  Noted that he has poor appetite.  Past Medical History:  Diagnosis Date   Arthritis    Hyperlipidemia    Hypertension    Nicotine dependence    Stroke (HCC)    LIGHT STROKE - NUMBNESS AND TINGLING IN LEFT HAND - 10/2008 APPROX   Past Surgical History:  Procedure Laterality Date   APPLICATION OF WOUND VAC Left 01/09/2024   Procedure: APPLICATION, WOUND VAC;  Surgeon: Edna Toribio LABOR, MD;  Location: [facility];  Service: Orthopedics;  Laterality: Left;   APPLICATION OF WOUND VAC Left 02/18/2024   Procedure: APPLICATION, WOUND VAC;  Surgeon: Edna Toribio LABOR, MD;  Location: [facility];  Service: Orthopedics;  Laterality: Left;   CONVERSION TO TOTAL HIP Left 01/09/2024   Procedure: CONVERSION TO TOTAL HIP;  Surgeon: Edna Toribio LABOR, MD;  Location: [facility];  Service: Orthopedics;  Laterality: Left;   FEMUR IM NAIL Left 07/27/2022   Procedure: REPAIR FEMUR WITH AUTOGRAFT/ RIA;  Surgeon: Celena Sharper, MD;  Location: [facility] OR;  Service: Orthopedics;  Laterality: Left;   HARDWARE REMOVAL Left 06/06/2022   Procedure: HARDWARE REMOVAL;  Surgeon: Celena Sharper, MD;  Location: [facility] OR;  Service: Orthopedics;  Laterality: Left;   HARDWARE REMOVAL Bilateral 07/27/2022   Procedure: REPAIR OF LEFT PROXIMAL FEMUR NONUNION, REMOVAL OF ANTIBIOTIC SPACER, REAMED INTERMEDULLARY ASPRIATE RIGHT FEMUR;  Surgeon: Celena Sharper, MD;  Location: [facility] OR;  Service: Orthopedics;  Laterality: Bilateral;   HIP CLOSED REDUCTION Left 01/19/2024   Procedure: Attempted CLOSED REDUCTION, HIP;  Surgeon: Cristy Bonner DASEN, MD;  Location[facility] OR;  Service: Orthopedics;  Laterality: Left;   HIP CLOSED REDUCTION  02/18/2024   Procedure: CLOSED REDUCTION, HIP;  Surgeon: Edna Toribio LABOR, MD;  Location: [facility];  Service: Orthopedics;;   INCISION AND DRAINAGE OF WOUND Left 02/18/2024   Procedure: IRRIGATION AND DEBRIDEMENT WOUND;  Surgeon: Edna Toribio LABOR, MD;  Location: [facility];  Service: Orthopedics;  Laterality: Left;   INTRAMEDULLARY (IM) NAIL INTERTROCHANTERIC Left 05/11/2021   Procedure: INTRAMEDULLARY (IM) NAIL INTERTROCHANTRIC OF LEFT SUBTHROCHANTRIC HIP FRACTURE;  Surgeon: Edie Norleen PARAS, MD;  Location: [facility] ORS;  Service: Orthopedics;  Laterality: Left;   ORIF FEMUR FRACTURE Left 06/06/2022   Procedure: OPEN REDUCTION INTERNAL FIXATION FEMORAL PROXIMAL FRACTURE;  Surgeon: Celena Sharper, MD;  Location: [facility] OR;  Service: Orthopedics;  Laterality: Left;   TOTAL HIP REVISION Left 01/21/2024   Procedure: Open Hip Reduction with Local Tissue Advancement;  Surgeon: Edna Toribio LABOR, MD;  Location: [facility] OR;  Service: Orthopedics;  Laterality: Left;   Current Facility-Administered Medications  Medication Dose Route Frequency Provider Last Rate Last Admin   0.9 %  sodium chloride  infusion (Manually program via Guardrails IV Fluids)   Intravenous Once Cockerham, Alicia M, PA-C       acetaminophen  (TYLENOL ) tablet 325-650 mg  325-650 mg Oral Q6H PRN Cockerham, Alicia M, PA-C   650 mg at 02/21/24 1118   amLODipine  (NORVASC ) tablet 10 mg  10 mg Oral Daily Cockerham, Alicia M, PA-C   10 mg at 02/19/24 9072   atorvastatin  (LIPITOR) tablet 20 mg  20 mg Oral QHS Cockerham, Alicia M, PA-C   20 mg at 02/21/24 2117   calcium  carbonate (TUMS - dosed in mg elemental calcium ) chewable  tablet 200 mg of elemental calcium   1 tablet Oral BID WC Edna Toribio LABOR, MD   200 mg of elemental calcium  at 02/22/24 1119   diphenhydrAMINE  (BENADRYL ) 12.5 MG/5ML elixir 12.5-25 mg  12.5-25 mg Oral Q4H PRN Cockerham, Alicia M, PA-C       docusate sodium  (COLACE) capsule 100 mg  100 mg Oral BID Cockerham, Alicia M, PA-C   100 mg at 02/22/24 1119   feeding supplement (ENSURE PLUS HIGH PROTEIN)  liquid 237 mL  237 mL Oral BID BM Edna Toribio LABOR, MD   237 mL at 02/19/24 9072   ferrous sulfate  tablet 325 mg  325 mg Oral Q breakfast Cockerham, Alicia M, PA-C   325 mg at 02/22/24 1119   folic acid  (FOLVITE ) tablet 1 mg  1 mg Oral Daily Cockerham, Alicia M, PA-C   1 mg at 02/22/24 1119   HYDROmorphone  (DILAUDID ) injection 0.5-1 mg  0.5-1 mg Intravenous Q4H PRN Cockerham, Alicia M, PA-C   1 mg at 02/18/24 2000   lactated ringers  infusion   Intravenous Continuous Cockerham, Alicia M, PA-C 75 mL/hr at 02/21/24 1350 New Bag at 02/21/24 1350   menthol -cetylpyridinium (CEPACOL) lozenge 3 mg  1 lozenge Oral PRN Cockerham, Alicia M, PA-C       Or   phenol (CHLORASEPTIC) mouth spray 1 spray  1 spray Mouth/Throat PRN Cockerham, Alicia M, PA-C       methocarbamol  (ROBAXIN ) tablet 500 mg  500 mg Oral Q6H PRN Cockerham, Alicia M, PA-C   500 mg at 02/21/24 1118   Or   methocarbamol  (ROBAXIN ) injection 500 mg  500 mg Intravenous Q6H PRN Cockerham, Alicia M, PA-C   500 mg at 02/18/24 1521   metoprolol  tartrate (LOPRESSOR ) tablet 100 mg  100 mg Oral BID Cockerham, Alicia M, PA-C   100 mg at 02/21/24 2118   ondansetron  (ZOFRAN ) tablet 4 mg  4 mg Oral Q6H PRN Cockerham, Alicia M, PA-C   4 mg at 02/21/24 1040   Or   ondansetron  (ZOFRAN ) injection 4 mg  4 mg Intravenous Q6H PRN Cockerham, Alicia M, PA-C   4 mg at 02/20/24 1256   oxyCODONE  (Oxy IR/ROXICODONE ) immediate release tablet 5-10 mg  5-10 mg Oral Q4H PRN Cockerham, Alicia M, PA-C   10 mg at 02/19/24 9461   pantoprazole  (PROTONIX ) injection 40 mg  40 mg Intravenous Q12H Kriss Stagger H, DO   40 mg at 02/22/24 1122   phytonadione  (VITAMIN K ) 5 mg in dextrose  5 % 50 mL IVPB  5 mg Intravenous Once Cockerham, Alicia M, PA-C 50.5 mL/hr at 02/22/24 1118 5 mg at 02/22/24 1118   polyethylene glycol (MIRALAX  / GLYCOLAX ) packet 17 g  17 g Oral Daily PRN Cockerham, Alicia M, PA-C       promethazine  (PHENERGAN ) 12.5 mg in sodium chloride  0.9 % 50 mL IVPB  12.5  mg Intravenous Q6H PRN Zella Katha HERO, MD   Stopped at 02/20/24 1608   senna (SENOKOT) tablet 8.6 mg  1 tablet Oral BID Cockerham, Alicia M, PA-C   8.6 mg at 02/22/24 1119   zolpidem  (AMBIEN ) tablet 5 mg  5 mg Oral QHS PRN Cockerham, Alicia M, PA-C       Allergies as of 02/18/2024   (No Known Allergies)   Review of Systems:  Review of Systems  Respiratory:  Negative for shortness of breath.   Cardiovascular:  Negative for chest pain.  Gastrointestinal:  Negative for abdominal pain, nausea and vomiting.    OBJECTIVE:  Temp:  [98.2 F (36.8 C)-98.9 F (37.2 C)] 98.9 F (37.2 C) (08/01 0728) Pulse Rate:  [97-108] 97 (08/01 0728) Resp:  [14-19] 18 (08/01 0728) BP: (119-139)/(65-84) 119/67 (08/01 0728) SpO2:  [98 %-99 %] 99 % (08/01 0728) Last BM Date : 02/20/24 Physical Exam Constitutional:      General: He is not in acute distress.    Appearance: He is not ill-appearing, toxic-appearing or diaphoretic.  Cardiovascular:     Comments: Regular pulse Pulmonary:     Effort: No respiratory distress.  Abdominal:     General: There is no distension.     Palpations: Abdomen is soft.     Tenderness: There is no abdominal tenderness.  Neurological:     Mental Status: He is alert.     Labs: Recent Labs    02/20/24 0334 02/20/24 1241 02/21/24 0941 02/21/24 1650 02/22/24 0617  WBC 12.3*  --   --  9.4 8.1  HGB 6.6*   < > 8.5* 7.5* 7.4*  HCT 21.4*   < > 27.5* 23.6* 24.2*  PLT 485*  --   --  344 340   < > = values in this interval not displayed.   BMET No results for input(s): NA, K, CL, CO2, GLUCOSE, BUN, CREATININE, CALCIUM  in the last 72 hours. LFT No results for input(s): PROT, ALBUMIN , AST, ALT, ALKPHOS, BILITOT, BILIDIR, IBILI in the last 72 hours. PT/INR Recent Labs    02/21/24 1343 02/22/24 0617  LABPROT 69.4* 57.0*  INR 7.9* 6.1*   Diagnostic imaging: MR ABDOMEN W WO CONTRAST Result Date: 02/21/2024 CLINICAL DATA:   Intra-abdominal neoplasm suspected EXAM: MRI ABDOMEN WITHOUT AND WITH CONTRAST TECHNIQUE: Multiplanar multisequence MR imaging of the abdomen was performed both before and after the administration of intravenous contrast. CONTRAST:  10mL GADAVIST  GADOBUTROL  1 MMOL/ML IV SOLN COMPARISON:  CT abdomen pelvis February 20, 2024 FINDINGS: Lower chest: No acute findings. Hepatobiliary: There is a hepatic dome segment 8 T2 hyperintense nonenhancing cyst measuring 7.9 x 6.9 cm. A punctate satellite cyst is also identified measuring 4 mm next to this cyst. There is a nonenhancing T2 hyperintense small cyst measuring 1 cm in sub subcapsular right hepatic lobe posterior to right hepatic vein (3/22). There is a lobulated lesion measuring 4.6 x 2.6x 4.3 cm in subcapsular posterior aspect of segment 6 with peripheral discontinuous nodular enhancement and gradual filling on delayed phase sequence consistent with hemangioma. These lesions are stable to prior CT. No significant hepatic steatosis. Mild hepatomegaly measuring 18.2 cm in craniocaudal dimension. Gallbladder stones/sludge. Pancreas: No mass, inflammatory changes, or other parenchymal abnormality identified. Mild atrophic changes. Spleen:  Within normal limits in size and appearance. Adrenals/Urinary Tract: Few T2 hyperintense subcentimeter simple cortical cysts in both kidneys. No evidence of hydronephrosis. Normal adrenal glands. Stomach/Bowel: No mass, inflammatory changes, or other parenchymal abnormality identified. Vascular/Lymphatic: No pathologically enlarged lymph nodes identified. No abdominal aortic aneurysm demonstrated. Other: Partial visualization of T2 hyperintense soft tissue changes around the left hip prosthesis better assessed on recent CT. Musculoskeletal: T2 hyperintense lesion in L4 vertebral body likely hemangioma. IMPRESSION: Mild hepatomegaly with multiple liver cysts. A right hepatic lobe lesion with signal characteristics most consistent with  hemangioma. Recommend follow-up to ensure stability. Few simple renal cortical cysts which does not require imaging follow-up. L4 lesion likely representing intraosseous hemangioma. Electronically Signed   By: Megan  Zare M.D.   On: 02/21/2024 14:23   IMPRESSION: Acute on chronic anemia Supratherapeutic INR Feculent emesis, no further Chronic nausea  PLAN: -Continue treatment of supratherapeutic INR -Continue to monitor for signs of recurrent emesis -No current signs of GI bleeding -Antiemetics as needed -Trend H/H, transfuse for hemoglobin less than 7 -[facility] will follow, Dr. Elicia tomorrow   LOS: 3 days   Jonathon Keas, DO [facility]

## 2024-02-22 NOTE — Progress Notes (Signed)
 Date and time results received: 02/22/24 0915   Test: INR Critical Value: 6.1  Name of Provider Notified: Dr. Edna, Dr. Vernon, and Bernarda Mclean, PA  Orders Received? Or Actions Taken?: Orders Received - See Orders for details

## 2024-02-23 DIAGNOSIS — M24452 Recurrent dislocation, left hip: Secondary | ICD-10-CM | POA: Diagnosis not present

## 2024-02-23 LAB — PROTIME-INR
INR: 1.1 (ref 0.8–1.2)
Prothrombin Time: 14.8 s (ref 11.4–15.2)

## 2024-02-23 LAB — AEROBIC/ANAEROBIC CULTURE W GRAM STAIN (SURGICAL/DEEP WOUND)
Gram Stain: NONE SEEN
Gram Stain: NONE SEEN

## 2024-02-23 LAB — CBC
HCT: 24.8 % — ABNORMAL LOW (ref 39.0–52.0)
Hemoglobin: 7.8 g/dL — ABNORMAL LOW (ref 13.0–17.0)
MCH: 28.3 pg (ref 26.0–34.0)
MCHC: 31.5 g/dL (ref 30.0–36.0)
MCV: 89.9 fL (ref 80.0–100.0)
Platelets: 320 K/uL (ref 150–400)
RBC: 2.76 MIL/uL — ABNORMAL LOW (ref 4.22–5.81)
RDW: 15.9 % — ABNORMAL HIGH (ref 11.5–15.5)
WBC: 8.8 K/uL (ref 4.0–10.5)
nRBC: 0.3 % — ABNORMAL HIGH (ref 0.0–0.2)

## 2024-02-23 NOTE — Progress Notes (Signed)
 PROGRESS NOTE    Jonathon Anderson  FMW:[id number] DOB: 1956/03/01 DOA: 02/18/2024 PCP: Jonathon Charlene LITTIE, MD   Brief Narrative:  Jonathon Anderson is a 68 y.o. male with medical history significant for light stroke on ASA BID, HTN, HLD admitted to the hospital by the orthopedic service for closed reduction irrigation and debridement of left hip arthroplasty now status post wound VAC placement on 7/28 and hospitalist service/TRH was consulted 02/20/2024 due to hypoxia, anemia and vomiting. Patient endorsed some mild abdominal discomfort.  He denied any cough or shortness of breath.  Hemoglobin fallen to 6.6 , no evidence of any kind of blood loss, he denied any bright red blood in his emesis, or any blood in his stools.  He denied any prior history of GI bleeding.  Assessment & Plan:   Principal Problem:   Recurrent dislocation of left hip  Status post closed reduction left hip dislocation and irrigation debridement superficial wound dehiscence 02/18/24 With evidence of complex periprosthetic collection adjacent to the left hip arthroplasty which may be either concerning for infectious or hematoma.  Will defer to primary service.  Acute hypoxic respiratory failure:Was likely due to severe acute anemia.  Out of concern of PE, consulting hospitalist ordered CT angiogram of the chest which ruled out PE or any other lung pathology.  Acute blood loss anemia: Patient's hemoglobin preoperatively was 9.5, dropped significantly postoperatively to 5.5 on 02/20/2024 for which patient received 2 units of PRBC transfusion and 1 unit of FFP, aspirin  discontinued, hemoglobin 8.5 posttransfusion which dropped further but has now stabilized around 7.5, FOBT was ordered which is still pending but GI does not suspect GI bleed. patient started on Protonix  twice daily.  per GI, anemia is likely secondary to bleeding at the surgical site/at hip as seen on the CT scan especially in the setting of supratherapeutic INR.  No  hematemesis or hematochezia reports.  Supratherapeutic INR/multiple liver cysts and hemangioma: As a part of workup for anemia and hypoxia, hospitalist ordered CT abdomen pelvis which showed incidental finding of A 3.7 x 2.2 cm heterogeneously enhancing lesion in the posterior right lobe of the liver is not characterized on this CT. Further characterization with MRI without and with contrast recommended.  MRI abdomen shows multiple liver cysts. A right hepatic lobe lesion with signal characteristics most consistent with hemangioma. Recommend follow-up to ensure stability.  Patient received multiple doses of vitamin K  due to INR around 7.1.  Oncology was consulted.  They agreed with the management.  Fortunately, his INR is now normalized at 1.1.  Per oncology note, they will follow-up with him in few days.  Essential hypertension: Controlled.  Continue amlodipine .  Chronic mild hyponatremia: Baseline around 130, at baseline now.  DVT prophylaxis: SCDs Start: 02/18/24 1634 Place TED hose Start: 02/18/24 1634   Code Status: Full Code  Family Communication:  None present at bedside.  Plan of care discussed with patient in length and he/she verbalized understanding and agreed with it.  Status is: Inpatient Remains inpatient appropriate because: Disposition per primary service   Estimated body mass index is 34.87 kg/m as calculated from the following:   Height as of this encounter: 5' 7 (1.702 m).   Weight as of this encounter: 101 kg.    Nutritional Assessment: Body mass index is 34.87 kg/m.SABRA Seen by dietician.  I agree with the assessment and plan as outlined below: Nutrition Status:        . Skin Assessment: I have examined the patient's skin  and I agree with the wound assessment as performed by the wound care RN as outlined below:    Consultants:  TRH and GI  Procedures:  As above  Antimicrobials:  Anti-infectives (From admission, onward)    Start     Dose/Rate Route  Frequency Ordered Stop   02/22/24 1400  ceFAZolin  (ANCEF ) IVPB 2g/100 mL premix       Note to Pharmacy: Likely go home on one week cefadroxil    2 g 200 mL/hr over 30 Minutes Intravenous Every 8 hours 02/22/24 1259     02/19/24 0800  ceFAZolin  (ANCEF ) IVPB 2g/100 mL premix        2 g 200 mL/hr over 30 Minutes Intravenous Every 8 hours 02/19/24 0548 02/21/24 2352   02/18/24 1800  ceFAZolin  (ANCEF ) IVPB 2g/100 mL premix        2 g 200 mL/hr over 30 Minutes Intravenous Every 6 hours 02/18/24 1633 02/18/24 2359   02/18/24 1305  vancomycin  (VANCOCIN ) powder  Status:  Discontinued          As needed 02/18/24 1305 02/18/24 1409   02/18/24 1045  ceFAZolin  (ANCEF ) IVPB 2g/100 mL premix        2 g 200 mL/hr over 30 Minutes Intravenous On call to O.R. 02/18/24 1043 02/18/24 1233   02/18/24 0000  sulfamethoxazole -trimethoprim  (BACTRIM  DS) 800-160 MG tablet        1 tablet Oral 2 times daily 02/18/24 1014 03/03/24 2359         Subjective: Patient seen and examined.  He has no complaints.  Objective: Vitals:   02/22/24 1321 02/22/24 2117 02/23/24 0123 02/23/24 0608  BP: 138/65 123/71 110/65 (!) 110/98  Pulse:  96 70 72  Resp: 17 16 16 16   Temp: 98.2 F (36.8 C) 98.4 F (36.9 C) 98.5 F (36.9 C) 98.5 F (36.9 C)  TempSrc:  Oral Oral   SpO2: 95% 100% 100% 99%  Weight:      Height:        Intake/Output Summary (Last 24 hours) at 02/23/2024 0825 Last data filed at 02/22/2024 2200 Gross per 24 hour  Intake 1523.63 ml  Output 990 ml  Net 533.63 ml   Filed Weights   02/18/24 0910 02/18/24 1047  Weight: 101 kg 101 kg    Examination:  General exam: Appears calm and comfortable  Respiratory system: Clear to auscultation. Respiratory effort normal. Cardiovascular system: S1 & S2 heard, RRR. No JVD, murmurs, rubs, gallops or clicks. No pedal edema. Gastrointestinal system: Abdomen is nondistended, soft and nontender. No organomegaly or masses felt. Normal bowel sounds heard. Central  nervous system: Alert and oriented. No focal neurological deficits. Extremities: Symmetric 5 x 5 power. Skin: No rashes, lesions or ulcers Psychiatry: Judgement and insight appear normal. Mood & affect appropriate.    Data Reviewed: I have personally reviewed following labs and imaging studies  CBC: Recent Labs  Lab 02/18/24 1045 02/19/24 0557 02/20/24 0334 02/20/24 1241 02/21/24 0305 02/21/24 0941 02/21/24 1650 02/22/24 0617 02/23/24 0430  WBC 10.5 10.4 12.3*  --   --   --  9.4 8.1 8.8  NEUTROABS 9.1*  --   --   --   --   --  7.4  --   --   HGB 9.5* 6.9* 6.6*   < > 8.4* 8.5* 7.5* 7.4* 7.8*  HCT 31.4* 22.6* 21.4*   < > 27.1* 27.5* 23.6* 24.2* 24.8*  MCV 90.2 89.7 89.5  --   --   --  86.1 88.6 89.9  PLT 558* 478* 485*  --   --   --  344 340 320   < > = values in this interval not displayed.   Basic Metabolic Panel: Recent Labs  Lab 02/18/24 1045 02/19/24 0330  NA 135 134*  K 4.6 4.9  CL 95* 92*  CO2 28 31  GLUCOSE 96 134*  BUN 11 11  CREATININE 0.82 0.87  CALCIUM  8.2* 7.7*   GFR: Estimated Creatinine Clearance: 90.8 mL/min (by C-G formula based on SCr of 0.87 mg/dL). Liver Function Tests: Recent Labs  Lab 02/18/24 1045  AST 13*  ALT 8  ALKPHOS 131*  BILITOT 1.0  PROT 6.9  ALBUMIN  2.1*   No results for input(s): LIPASE, AMYLASE in the last 168 hours. No results for input(s): AMMONIA in the last 168 hours. Coagulation Profile: Recent Labs  Lab 02/21/24 0437 02/21/24 1343 02/22/24 0617 02/22/24 1812 02/23/24 0430  INR 7.3* 7.9* 6.1* 1.4* 1.1   Cardiac Enzymes: No results for input(s): CKTOTAL, CKMB, CKMBINDEX, TROPONINI in the last 168 hours. BNP (last 3 results) No results for input(s): PROBNP in the last 8760 hours. HbA1C: No results for input(s): HGBA1C in the last 72 hours. CBG: Recent Labs  Lab 02/20/24 0700  GLUCAP 121*   Lipid Profile: No results for input(s): CHOL, HDL, LDLCALC, TRIG, CHOLHDL, LDLDIRECT  in the last 72 hours. Thyroid Function Tests: No results for input(s): TSH, T4TOTAL, FREET4, T3FREE, THYROIDAB in the last 72 hours. Anemia Panel: No results for input(s): VITAMINB12, FOLATE, FERRITIN, TIBC, IRON , RETICCTPCT in the last 72 hours. Sepsis Labs: No results for input(s): PROCALCITON, LATICACIDVEN in the last 168 hours.  Recent Results (from the past 240 hours)  Aerobic/Anaerobic Culture w Gram Stain (surgical/deep wound)     Status: None (Preliminary result)   Collection Time: 02/18/24  1:03 PM   Specimen: Soft Tissue, Other  Result Value Ref Range Status   Specimen Description   Final    TISSUE Performed at [facility], [address] [place], [place] [address]    Special Requests   Final    NONE Performed at [facility], [address], [place], [place] [address]    Gram Stain   Final    NO WBC SEEN NO ORGANISMS SEEN Performed at [facility], [address] [place], [place] [address]    Culture   Final    RARE STAPHYLOCOCCUS AUREUS NO ANAEROBES ISOLATED; CULTURE IN PROGRESS FOR 5 DAYS    Report Status PENDING  Incomplete  Aerobic/Anaerobic Culture w Gram Stain (surgical/deep wound)     Status: None (Preliminary result)   Collection Time: 02/18/24  1:03 PM   Specimen: Soft Tissue, Other  Result Value Ref Range Status   Specimen Description   Final    TISSUE Performed at [facility], [address], [place], [place] [address]    Special Requests   Final    NONE Performed at [facility], [address], [place], [place] [address]    Gram Stain   Final    NO WBC SEEN NO ORGANISMS SEEN Performed at [facility], [address], [place], [place] [address]    Culture   Final    RARE STAPHYLOCOCCUS AUREUS NO ANAEROBES ISOLATED; CULTURE IN PROGRESS FOR 5 DAYS    Report Status PENDING  Incomplete   Organism ID, Bacteria STAPHYLOCOCCUS AUREUS  Final       Susceptibility  Staphylococcus aureus - MIC*    CIPROFLOXACIN <=0.5 SENSITIVE Sensitive     ERYTHROMYCIN <=0.25 SENSITIVE Sensitive     GENTAMICIN <=0.5 SENSITIVE Sensitive     OXACILLIN 0.5 SENSITIVE Sensitive     TETRACYCLINE <=1 SENSITIVE Sensitive     VANCOMYCIN  1 SENSITIVE Sensitive     TRIMETH /SULFA  <=10 SENSITIVE Sensitive     CLINDAMYCIN <=0.25 SENSITIVE Sensitive     RIFAMPIN <=0.5 SENSITIVE Sensitive     Inducible Clindamycin NEGATIVE Sensitive     LINEZOLID 2 SENSITIVE Sensitive     * RARE STAPHYLOCOCCUS AUREUS     Radiology Studies: MR ABDOMEN W WO CONTRAST Result Date: 02/21/2024 CLINICAL DATA:  Intra-abdominal neoplasm suspected EXAM: MRI ABDOMEN WITHOUT AND WITH CONTRAST TECHNIQUE: Multiplanar multisequence MR imaging of the abdomen was performed both before and after the administration of intravenous contrast. CONTRAST:  10mL GADAVIST  GADOBUTROL  1 MMOL/ML IV SOLN COMPARISON:  CT abdomen pelvis February 20, 2024 FINDINGS: Lower chest: No acute findings. Hepatobiliary: There is a hepatic dome segment 8 T2 hyperintense nonenhancing cyst measuring 7.9 x 6.9 cm. A punctate satellite cyst is also identified measuring 4 mm next to this cyst. There is a nonenhancing T2 hyperintense small cyst measuring 1 cm in sub subcapsular right hepatic lobe posterior to right hepatic vein (3/22). There is a lobulated lesion measuring 4.6 x 2.6x 4.3 cm in subcapsular posterior aspect of segment 6 with peripheral discontinuous nodular enhancement and gradual filling on delayed phase sequence consistent with hemangioma. These lesions are stable to prior CT. No significant hepatic steatosis. Mild hepatomegaly measuring 18.2 cm in craniocaudal dimension. Gallbladder stones/sludge. Pancreas: No mass, inflammatory changes, or other parenchymal abnormality identified. Mild atrophic changes. Spleen:  Within normal limits in size and appearance. Adrenals/Urinary Tract: Few T2 hyperintense subcentimeter simple  cortical cysts in both kidneys. No evidence of hydronephrosis. Normal adrenal glands. Stomach/Bowel: No mass, inflammatory changes, or other parenchymal abnormality identified. Vascular/Lymphatic: No pathologically enlarged lymph nodes identified. No abdominal aortic aneurysm demonstrated. Other: Partial visualization of T2 hyperintense soft tissue changes around the left hip prosthesis better assessed on recent CT. Musculoskeletal: T2 hyperintense lesion in L4 vertebral body likely hemangioma. IMPRESSION: Mild hepatomegaly with multiple liver cysts. A right hepatic lobe lesion with signal characteristics most consistent with hemangioma. Recommend follow-up to ensure stability. Few simple renal cortical cysts which does not require imaging follow-up. L4 lesion likely representing intraosseous hemangioma. Electronically Signed   By: Megan  Zare M.D.   On: 02/21/2024 14:23    Scheduled Meds:  amLODipine   10 mg Oral Daily   atorvastatin   20 mg Oral QHS   calcium  carbonate  1 tablet Oral BID WC   docusate sodium   100 mg Oral BID   feeding supplement  237 mL Oral BID BM   ferrous sulfate   325 mg Oral Q breakfast   folic acid   1 mg Oral Daily   metoprolol  tartrate  100 mg Oral BID   pantoprazole  (PROTONIX ) IV  40 mg Intravenous Q12H   senna  1 tablet Oral BID   Continuous Infusions:   ceFAZolin  (ANCEF ) IV 2 g (02/23/24 0505)   lactated ringers  75 mL/hr at 02/22/24 1523   promethazine  (PHENERGAN ) injection (IM or IVPB) Stopped (02/20/24 1608)     LOS: 4 days   Fredia Skeeter, MD [facility]  02/23/2024, 8:25 AM   *Please note that this is a verbal dictation therefore any spelling or grammatical errors are due to the Dragon Medical One system interpretation.  Please page via Amion  and do not message via secure chat for urgent patient care matters. Secure chat can be used for non urgent patient care matters.  How to contact the TRH Attending or Consulting provider 7A - 7P or covering  provider during after hours 7P -7A, for this patient?  Check the care team in [facility] and look for a) attending/consulting TRH provider listed and b) the TRH team listed. Page or secure chat 7A-7P. Log into [email or web address] and use [place]'s universal password to access. If you do not have the password, please contact the hospital operator. Locate the TRH provider you are looking for under [facility] and page to a number that you can be directly reached. If you still have difficulty reaching the provider, please page the [facility] (Director on Call) for the Hospitalists listed on amion for assistance.

## 2024-02-23 NOTE — Progress Notes (Signed)
 5 Days Post-Op Procedure(s) (LRB): IRRIGATION AND DEBRIDEMENT WOUND (Left) APPLICATION, WOUND VAC (Left) CLOSED REDUCTION, HIP  Subjective: Patient reports pain as mild of surgical leg. Feels a little btter this morning   Decreased appetite, limited PO intake.  NG tube was unsuccessful however N/V has improved some.    7/30 received 2 units PRBCs, 7/31 Hgb up to 8.4 after 2 units of blood transfusion, then back down to 7.5 around 1700. 8/2 Hgb 7.8  INR 1.1 today.   Objective:   VITALS:   Vitals:   02/22/24 2117 02/23/24 0123 02/23/24 0608 02/23/24 1041  BP: 123/71 110/65 (!) 110/98 113/66  Pulse: 96 70 72 68  Resp: 16 16 16 17   Temp: 98.4 F (36.9 C) 98.5 F (36.9 C) 98.5 F (36.9 C) 98.1 F (36.7 C)  TempSrc: Oral Oral    SpO2: 100% 100% 99% 98%  Weight:      Height:        AAOx4, resting in NAD Sensation intact distally Intact pulses distally Dorsiflexion/Plantar flexion intact Incision: dressing C/D/I Compartment soft Wound VAC holding suction 120 mmHg and no output in canister Wiggles toes appropriately   Lab Results  Component Value Date   WBC 8.8 02/23/2024   HGB 7.8 (L) 02/23/2024   HCT 24.8 (L) 02/23/2024   MCV 89.9 02/23/2024   PLT 320 02/23/2024   BMET    Component Value Date/Time   NA 134 (L) 02/19/2024 0330   K 4.9 02/19/2024 0330   CL 92 (L) 02/19/2024 0330   CO2 31 02/19/2024 0330   GLUCOSE 134 (H) 02/19/2024 0330   BUN 11 02/19/2024 0330   CREATININE 0.87 02/19/2024 0330   CALCIUM  7.7 (L) 02/19/2024 0330   GFRNONAA >60 02/19/2024 0330      Xray: Reduced proximal femoral replacement arthroplasty in good alignment without adverse features  Assessment/Plan: 5 Days Post-Op   Principal Problem:   Recurrent dislocation of left hip  Status post closed reduction left hip dislocation and irrigation debridement superficial wound dehiscence 02/18/24  7/30: Plan for transfusion with Hgb of 6.6.  2 units ordered.  Details of this  morning events discussed with nursing staff and rapid response team.  Plan for hospitalist consult to evaluate for post-operative concerns such as pulmonary embolism or bowel obstruction.   76: Spoke with Dr. Roxane of hospitalist team regarding patient case, shared similar concerns.  Plan for further workup likely including CTA.  7/31: Follow-up repeat INR result this morning.  May need intervention if the 7.1 result is felt to be real.  Appreciate input from medicine and GI.  Does have a fluid collection around the left hip likely traumatic hematoma due to the dislocation.  Could be exacerbated by the INR result if real.  Would improve if the INR is corrected..  8/1: Still unclear etiology so far, however consulted with general surgery, GI, and heme/onc (see separate notes).  May give another 5mg  Vit K IV if INR still > 4-5 per Heme/onc recommendation.  GI symptoms have lessened but still unclear whether he's having abdominal pain, patient poor historian.  PLTs okay yesterday.  Hgb downtrending again yesterday PM.  May reach back out to Heme/Onc depending on pending labs.  Discussed plan with patient at length.  8/2: INR improved and WNL today. Will order nutrition consult due to lack of appetite.    Post op recs: WB: WBAT LLE with strict posterior hip precautions Abx: ancef  x23 hours post op, dc on bactrim  Imaging:  PACU xrays Dressing: Prevena incisional wound VAC DVT prophylaxis: Aspirin  81 mg Follow up: 1 week after surgery for a wound check with Dr. Edna at [facility].  Address: [address], [place], [place] [address]  Office Phone: [phone]    Aleck LOISE Stalling 02/23/2024, 10:52 AM    Contact information:   Weekdays 7am-5pm epic message Dr. Edna, or call office for patient follow up: [phone] After hours and holidays please check [email or web address] for group call information for [facility]

## 2024-02-23 NOTE — Progress Notes (Signed)
 Physical Therapy Treatment Patient Details Name: Jonathon Anderson MRN: [id number] DOB: July 01, 1956 Today's Date: 02/23/2024   History of Present Illness 68 yo male  Closed reduction left total hip arthroplasty, left hip wound debridement, Application of Kerecis graft, application of incisional wound VAC    PMH: multiple hip dislocations, S/p L closed reduction L hip attempt in OR 6/28, not successful; s/p L open hip reduction 6/30. On 6/18, S/p conversion to L THA with posterior hip precautions given L intertrochanteric femur fx nonunion, recurrent L hip surgery since 2023, HTN, HLD, TIA, CVA with residual LUE n/t.    PT Comments   Pt continues cooperative but progressing slowly - limited by pain/fatigue and requiring significant assist for performance of basic mobility tasks.  This date, pt assisted from reciner to take several small steps and return to bed.  Patient will benefit from continued inpatient follow up therapy, <3 hours/day to maximize IND and safety prior to dc home with limited assist.    If plan is discharge home, recommend the following: A lot of help with walking and/or transfers;A lot of help with bathing/dressing/bathroom;Assistance with cooking/housework;Help with stairs or ramp for entrance;Assist for transportation   Can travel by private vehicle     No  Equipment Recommendations  None recommended by PT    Recommendations for Other Services       Precautions / Restrictions Precautions Precautions: Posterior Hip Precaution Booklet Issued: Yes (comment) Recall of Precautions/Restrictions: Intact Precaution/Restrictions Comments: wound vac Required Braces or Orthoses: Other Brace Restrictions Weight Bearing Restrictions Per Provider Order: Yes LLE Weight Bearing Per Provider Order: Weight bearing as tolerated Other Position/Activity Restrictions: with brace in place     Mobility  Bed Mobility Overal bed mobility: Needs Assistance Bed Mobility: Sit to Supine      Supine to sit: Mod assist, +2 for physical assistance, +2 for safety/equipment, HOB elevated, Used rails Sit to supine: Mod assist, +2 for physical assistance, +2 for safety/equipment, HOB elevated   General bed mobility comments: Increased time, use of bed pad to assist. Cues for use of R LE to self assist and adherence to THP    Transfers Overall transfer level: Needs assistance Equipment used: Rolling walker (2 wheels) Transfers: Sit to/from Stand Sit to Stand: Min assist, Mod assist, +2 physical assistance, +2 safety/equipment, From elevated surface           General transfer comment: cues for LE management, use of UEs to self assist and adherence to THP;  Physical assist to bring wt up and fwd and to balance in initial standing    Ambulation/Gait Ambulation/Gait assistance: Min assist, +2 physical assistance, +2 safety/equipment Gait Distance (Feet): 3 Feet Assistive device: Rolling walker (2 wheels) Gait Pattern/deviations: Step-to pattern, Decreased step length - right, Decreased step length - left, Shuffle, Trunk flexed, Wide base of support Gait velocity: decreased     General Gait Details: cues for sequence, posture and position from RW - distance ltd by fatigue   Stairs             Wheelchair Mobility     Tilt Bed    Modified Rankin (Stroke Patients Only)       Balance Overall balance assessment: Needs assistance Sitting-balance support: Feet supported, Bilateral upper extremity supported Sitting balance-Leahy Scale: Fair Sitting balance - Comments: often leaning to R side and BUE support Postural control: Right lateral lean Standing balance support: Bilateral upper extremity supported Standing balance-Leahy Scale: Poor Standing balance comment: requires BUE support  Communication Communication Communication: Impaired Factors Affecting Communication: Hearing impaired;Other (comment)  Cognition Arousal:  Alert Behavior During Therapy: WFL for tasks assessed/performed   PT - Cognitive impairments: No apparent impairments                         Following commands: Intact      Cueing Cueing Techniques: Verbal cues, Gestural cues  Exercises      General Comments        Pertinent Vitals/Pain Pain Assessment Pain Assessment: Faces Faces Pain Scale: Hurts even more Pain Location: L hip Pain Descriptors / Indicators: Aching, Sore, Operative site guarding Pain Intervention(s): Limited activity within patient's tolerance, Monitored during session, Premedicated before session    Home Living Family/patient expects to be discharged to:: Private residence Living Arrangements: Other relatives Available Help at Discharge: Family;Available 24 hours/day Type of Home: Mobile home Home Access: Stairs to enter Entrance Stairs-Rails: Right;Left Entrance Stairs-Number of Steps: 6   Home Layout: One level Home Equipment: Control and instrumentation engineer (2 wheels);Cane - single point;Adaptive equipment Additional Comments: planning to discharge to sister's house    Prior Function            PT Goals (current goals can now be found in the care plan section) Acute Rehab PT Goals Patient Stated Goal: Regain IND PT Goal Formulation: With patient Time For Goal Achievement: 03/07/24 Potential to Achieve Goals: Fair Progress towards PT goals: Progressing toward goals    Frequency    7X/week      PT Plan      Co-evaluation PT/OT/SLP Co-Evaluation/Treatment: Yes Reason for Co-Treatment: For patient/therapist safety;To address functional/ADL transfers PT goals addressed during session: Mobility/safety with mobility OT goals addressed during session: ADL's and self-care      AM-PAC PT 6 Clicks Mobility   Outcome Measure  Help needed turning from your back to your side while in a flat bed without using bedrails?: A Lot Help needed moving from lying on your back to  sitting on the side of a flat bed without using bedrails?: A Lot Help needed moving to and from a bed to a chair (including a wheelchair)?: A Lot Help needed standing up from a chair using your arms (e.g., wheelchair or bedside chair)?: Total Help needed to walk in hospital room?: Total Help needed climbing 3-5 steps with a railing? : Total 6 Click Score: 9    End of Session Equipment Utilized During Treatment: Gait belt Activity Tolerance: Patient limited by fatigue Patient left: in bed;with bed alarm set Nurse Communication: Mobility status PT Visit Diagnosis: Difficulty in walking, not elsewhere classified (R26.2)     Time: 8658-8641 PT Time Calculation (min) (ACUTE ONLY): 17 min  Charges:    $Therapeutic Activity: 8-22 mins PT General Charges $$ ACUTE PT VISIT: 1 Visit                     [facility] PT Acute Rehabilitation Services Office [phone]    Kyrie Fludd 02/23/2024, 2:56 PM

## 2024-02-23 NOTE — Progress Notes (Signed)
 Physical Therapy Treatment Patient Details Name: Jonathon Anderson MRN: [id number] DOB: 25-Oct-1955 Today's Date: 02/23/2024   History of Present Illness 68 yo male  Closed reduction left total hip arthroplasty, left hip wound debridement, Application of Kerecis graft, application of incisional wound VAC    PMH: multiple hip dislocations, S/p L closed reduction L hip attempt in OR 6/28, not successful; s/p L open hip reduction 6/30. On 6/18, S/p conversion to L THA with posterior hip precautions given L intertrochanteric femur fx nonunion, recurrent L hip surgery since 2023, HTN, HLD, TIA, CVA with residual LUE n/t.    PT Comments  Pt continues cooperative but progressing slowly and requiring significant assist for performance of basic mobility tasks.  This date, pt assisted from bed to take several small steps and sit up in recliner.  Patient will benefit from continued inpatient follow up therapy, <3 hours/day to maximize IND and safety prior to dc home with limited assist.     If plan is discharge home, recommend the following: A lot of help with walking and/or transfers;A lot of help with bathing/dressing/bathroom;Assistance with cooking/housework;Help with stairs or ramp for entrance;Assist for transportation   Can travel by private vehicle     No  Equipment Recommendations  None recommended by PT    Recommendations for Other Services       Precautions / Restrictions Precautions Precautions: Posterior Hip Precaution Booklet Issued: Yes (comment) Recall of Precautions/Restrictions: Intact Precaution/Restrictions Comments: wound vac Required Braces or Orthoses: Other Brace (abductor brace) Restrictions Weight Bearing Restrictions Per Provider Order: Yes LLE Weight Bearing Per Provider Order: Weight bearing as tolerated Other Position/Activity Restrictions: with brace in place     Mobility  Bed Mobility Overal bed mobility: Needs Assistance Bed Mobility: Supine to Sit      Supine to sit: Mod assist, +2 for physical assistance, +2 for safety/equipment, HOB elevated, Used rails     General bed mobility comments: Increased time, use of bed pad to assist. Cues for use of R LE to self assist and adherence to THP    Transfers Overall transfer level: Needs assistance Equipment used: Rolling walker (2 wheels) Transfers: Sit to/from Stand Sit to Stand: Min assist, Mod assist, +2 physical assistance, +2 safety/equipment, From elevated surface           General transfer comment: cues for LE management, use of UEs to self assist and adherence to THP;  Physical assist to bring wt up and fwd and to balance in initial standing    Ambulation/Gait Ambulation/Gait assistance: Min assist, +2 physical assistance, +2 safety/equipment Gait Distance (Feet): 3 Feet Assistive device: Rolling walker (2 wheels) Gait Pattern/deviations: Step-to pattern, Decreased step length - right, Decreased step length - left, Shuffle, Trunk flexed, Wide base of support Gait velocity: decreased     General Gait Details: cues for sequence, posture and position from RW - distance ltd by fatigue   Stairs             Wheelchair Mobility     Tilt Bed    Modified Rankin (Stroke Patients Only)       Balance Overall balance assessment: Needs assistance Sitting-balance support: Feet supported, Bilateral upper extremity supported Sitting balance-Leahy Scale: Fair Sitting balance - Comments: often leaning to R side and BUE support Postural control: Right lateral lean Standing balance support: Bilateral upper extremity supported Standing balance-Leahy Scale: Poor Standing balance comment: requires BUE support  Communication Communication Communication: Impaired Factors Affecting Communication: Hearing impaired;Other (comment)  Cognition Arousal: Alert Behavior During Therapy: WFL for tasks assessed/performed   PT - Cognitive  impairments: No apparent impairments                         Following commands: Intact      Cueing Cueing Techniques: Verbal cues, Gestural cues  Exercises      General Comments        Pertinent Vitals/Pain Pain Assessment Pain Assessment: Faces Faces Pain Scale: Hurts worst Pain Location: L hip Pain Descriptors / Indicators: Aching, Sore, Operative site guarding Pain Intervention(s): Limited activity within patient's tolerance, Monitored during session, Premedicated before session    Home Living Family/patient expects to be discharged to:: Private residence Living Arrangements: Other relatives Available Help at Discharge: Family;Available 24 hours/day Type of Home: Mobile home Home Access: Stairs to enter Entrance Stairs-Rails: Right;Left Entrance Stairs-Number of Steps: 6   Home Layout: One level Home Equipment: Control and instrumentation engineer (2 wheels);Cane - single point;Adaptive equipment Additional Comments: planning to discharge to sister's house    Prior Function            PT Goals (current goals can now be found in the care plan section) Acute Rehab PT Goals Patient Stated Goal: Regain IND PT Goal Formulation: With patient Time For Goal Achievement: 03/07/24 Potential to Achieve Goals: Fair Progress towards PT goals: Progressing toward goals    Frequency    7X/week      PT Plan      Co-evaluation PT/OT/SLP Co-Evaluation/Treatment: Yes Reason for Co-Treatment: For patient/therapist safety;To address functional/ADL transfers PT goals addressed during session: Mobility/safety with mobility OT goals addressed during session: ADL's and self-care      AM-PAC PT 6 Clicks Mobility   Outcome Measure  Help needed turning from your back to your side while in a flat bed without using bedrails?: A Lot Help needed moving from lying on your back to sitting on the side of a flat bed without using bedrails?: A Lot Help needed moving to  and from a bed to a chair (including a wheelchair)?: A Lot Help needed standing up from a chair using your arms (e.g., wheelchair or bedside chair)?: Total Help needed to walk in hospital room?: Total Help needed climbing 3-5 steps with a railing? : Total 6 Click Score: 9    End of Session Equipment Utilized During Treatment: Gait belt Activity Tolerance: Patient limited by fatigue Patient left: in chair;with chair alarm set Nurse Communication: Mobility status PT Visit Diagnosis: Difficulty in walking, not elsewhere classified (R26.2)     Time: 8889-8862 PT Time Calculation (min) (ACUTE ONLY): 27 min  Charges:    $Therapeutic Activity: 8-22 mins PT General Charges $$ ACUTE PT VISIT: 1 Visit                     [facility] PT Acute Rehabilitation Services Office [phone]    Latashia Koch 02/23/2024, 2:50 PM

## 2024-02-23 NOTE — Progress Notes (Signed)
 [facility] Progress Note  Jonathon Anderson 68 y.o. Jul 29, 1955  CC: Anemia, elevated INR   Subjective: Patient seen and examined at bedside.  Denies any bleeding episodes.  No GI symptoms.  ROS : Afebrile, negative for chest pain   Objective: Vital signs in last 24 hours: Vitals:   02/23/24 0608 02/23/24 1041  BP: (!) 110/98 113/66  Pulse: 72 68  Resp: 16 17  Temp: 98.5 F (36.9 C) 98.1 F (36.7 C)  SpO2: 99% 98%    Physical Exam: Resting comfortably, not in acute distress, abdominal exam benign.  Alert and oriented x 3.  Mood and affect normal.  Lab Results: No results for input(s): NA, K, CL, CO2, GLUCOSE, BUN, CREATININE, CALCIUM , MG, PHOS in the last 72 hours. No results for input(s): AST, ALT, ALKPHOS, BILITOT, PROT, ALBUMIN  in the last 72 hours. Recent Labs    02/21/24 1650 02/22/24 0617 02/23/24 0430  WBC 9.4 8.1 8.8  NEUTROABS 7.4  --   --   HGB 7.5* 7.4* 7.8*  HCT 23.6* 24.2* 24.8*  MCV 86.1 88.6 89.9  PLT 344 340 320   Recent Labs    02/22/24 1812 02/23/24 0430  LABPROT 18.0* 14.8  INR 1.4* 1.1      Assessment/Plan: -Acute on chronic anemia without any overt bleeding in the setting of supratherapeutic INR.  INR back to normal now.  Hemoglobin relatively stable. - Chronic nausea and vomiting.  Improved.  Recommendations -------------------------- -  Continue supportive care.  Continue Protonix  40 mg twice a day while in hospital for nausea and vomiting and changed to 40 mg once a day after discharge. - He may benefit from outpatient workup for his chronic anemia and chronic nausea.   - No further inpatient GI workup planned.  -GI will sign off.  Call us  back if needed.   Layla Lah MD, FACP 02/23/2024, 12:43 PM  Contact #  [phone]

## 2024-02-23 NOTE — Evaluation (Signed)
 Occupational Therapy Evaluation Patient Details Name: Jonathon Anderson MRN: [id number] DOB: January 03, 1956 Today's Date: 02/23/2024   History of Present Illness   68 yo male  Closed reduction left total hip arthroplasty, left hip wound debridement, Application of Kerecis graft, application of incisional wound VAC    PMH: multiple hip dislocations, S/p L closed reduction L hip attempt in OR 6/28, not successful; s/p L open hip reduction 6/30. On 6/18, S/p conversion to L THA with posterior hip precautions given L intertrochanteric femur fx nonunion, recurrent L hip surgery since 2023, HTN, HLD, TIA, CVA with residual LUE n/t.     Clinical Impressions Pt presented in bed and agreeable to session with Physical Therapy. At this time was able to recall 2/3 precautions and required max-total assist with donning hip abduction brace. Pt agreeable to go to chair in this session with mod assist 1-2 to EOB and then sit to stand with min-mod x2 for safety with RW. Pt requires CGA for UE dressing and max-total assist with LE ADLS. Patient will benefit from continued inpatient follow up therapy, <3 hours/day.     If plan is discharge home, recommend the following:   Help with stairs or ramp for entrance;Assistance with cooking/housework;Assist for transportation;A little help with walking and/or transfers;A lot of help with bathing/dressing/bathroom     Functional Status Assessment   Patient has had a recent decline in their functional status and demonstrates the ability to make significant improvements in function in a reasonable and predictable amount of time.     Equipment Recommendations   Other (comment) (TBD)     Recommendations for Other Services         Precautions/Restrictions   Precautions Precautions: Posterior Hip Precaution Booklet Issued: Yes (comment) Recall of Precautions/Restrictions: Intact Precaution/Restrictions Comments: wound vac Restrictions Weight Bearing  Restrictions Per Provider Order: Yes LLE Weight Bearing Per Provider Order: Weight bearing as tolerated     Mobility Bed Mobility Overal bed mobility: Needs Assistance Bed Mobility: Supine to Sit     Supine to sit: Mod assist, +2 for physical assistance, +2 for safety/equipment, HOB elevated, Used rails     General bed mobility comments: Increased time, use of bed pad to assist. Cues for use of R LE to self assist and adherence to THP    Transfers Overall transfer level: Needs assistance Equipment used: Rolling walker (2 wheels) Transfers: Sit to/from Stand Sit to Stand: Min assist, Mod assist, +2 physical assistance, +2 safety/equipment, From elevated surface                  Balance Overall balance assessment: Needs assistance Sitting-balance support: Feet supported, Bilateral upper extremity supported Sitting balance-Leahy Scale: Fair Sitting balance - Comments: often leaning to R side and BUE support Postural control: Right lateral lean Standing balance support: Bilateral upper extremity supported Standing balance-Leahy Scale: Poor Standing balance comment: requires BUE support                           ADL either performed or assessed with clinical judgement   ADL Overall ADL's : Needs assistance/impaired Eating/Feeding: Independent;Sitting   Grooming: Set up;Sitting   Upper Body Bathing: Sitting;Contact guard assist   Lower Body Bathing: Maximal assistance;Sit to/from stand   Upper Body Dressing : Contact guard assist;Sitting   Lower Body Dressing: Maximal assistance;Sit to/from stand   Toilet Transfer: Minimal assistance;+2 for physical assistance;+2 for safety/equipment;BSC/3in1;Rolling walker (2 wheels) Toilet Transfer Details (indicate cue type  and reason): simulater to chair Toileting- Clothing Manipulation and Hygiene: Maximal assistance;Sit to/from stand       Functional mobility during ADLs: Minimal assistance;Rolling walker (2  wheels);Cueing for sequencing;Cueing for safety       Vision Baseline Vision/History: 1 Wears glasses Ability to See in Adequate Light: 0 Adequate Patient Visual Report: No change from baseline Vision Assessment?: Wears glasses for driving;Wears glasses for reading     Perception         Praxis         Pertinent Vitals/Pain Pain Assessment Pain Assessment: Faces Faces Pain Scale: Hurts even more Pain Location: L hip Pain Descriptors / Indicators: Aching, Sore, Operative site guarding Pain Intervention(s): Limited activity within patient's tolerance, Monitored during session, Repositioned     Extremity/Trunk Assessment Upper Extremity Assessment Upper Extremity Assessment: Generalized weakness   Lower Extremity Assessment Lower Extremity Assessment: Defer to PT evaluation       Communication Communication Communication: Impaired Factors Affecting Communication: Hearing impaired;Other (comment)   Cognition Arousal: Alert Behavior During Therapy: WFL for tasks assessed/performed Cognition: No family/caregiver present to determine baseline                               Following commands: Intact       Cueing  General Comments   Cueing Techniques: Verbal cues;Gestural cues      Exercises     Shoulder Instructions      Home Living Family/patient expects to be discharged to:: Private residence Living Arrangements: Other relatives Available Help at Discharge: Family;Available 24 hours/day Type of Home: Mobile home Home Access: Stairs to enter Entrance Stairs-Number of Steps: 6 Entrance Stairs-Rails: Right;Left Home Layout: One level     Bathroom Shower/Tub: Chief Strategy Officer: Standard Bathroom Accessibility: Yes   Home Equipment: Control and instrumentation engineer (2 wheels);Cane - single point;Adaptive equipment Adaptive Equipment: Reacher;Long-handled sponge Additional Comments: planning to discharge to sister's  house      Prior Functioning/Environment Prior Level of Function : Independent/Modified Independent;Driving             Mobility Comments: using RW PTA since previous surgery ADLs Comments: He was independent with ADLs and driving.    OT Problem List: Decreased strength;Impaired balance (sitting and/or standing);Decreased knowledge of use of DME or AE;Pain   OT Treatment/Interventions: Self-care/ADL training;Therapeutic exercise;Therapeutic activities;Energy conservation;Patient/family education;DME and/or AE instruction;Balance training      OT Goals(Current goals can be found in the care plan section)   Acute Rehab OT Goals Patient Stated Goal: to get moving more OT Goal Formulation: With patient Time For Goal Achievement: 03/01/24 Potential to Achieve Goals: Good   OT Frequency:  Min 2X/week    Co-evaluation              AM-PAC OT 6 Clicks Daily Activity     Outcome Measure Help from another person eating meals?: A Little Help from another person taking care of personal grooming?: A Little Help from another person toileting, which includes using toliet, bedpan, or urinal?: A Lot Help from another person bathing (including washing, rinsing, drying)?: A Lot Help from another person to put on and taking off regular upper body clothing?: A Little Help from another person to put on and taking off regular lower body clothing?: A Lot 6 Click Score: 15   End of Session Equipment Utilized During Treatment: Gait belt;Rolling walker (2 wheels) Nurse Communication: Mobility status  Activity Tolerance: Patient  tolerated treatment well Patient left: in chair;with call bell/phone within reach;with chair alarm set  OT Visit Diagnosis: Muscle weakness (generalized) (M62.81);Pain;Other abnormalities of gait and mobility (R26.89) Pain - Right/Left: Left Pain - part of body: Hip                Time: 8892-8865 OT Time Calculation (min): 27 min Charges:  OT General  Charges $OT Visit: 1 Visit OT Evaluation $OT Eval Moderate Complexity: 1 Mod  Kieren Adkison K OTR/L  Acute Rehab Services  [phone] office number   Warrick Berber 02/23/2024, 1:25 PM

## 2024-02-23 NOTE — Plan of Care (Signed)

## 2024-02-24 DIAGNOSIS — Z791 Long term (current) use of non-steroidal anti-inflammatories (NSAID): Secondary | ICD-10-CM | POA: Diagnosis not present

## 2024-02-24 DIAGNOSIS — I10 Essential (primary) hypertension: Secondary | ICD-10-CM | POA: Diagnosis not present

## 2024-02-24 DIAGNOSIS — D500 Iron deficiency anemia secondary to blood loss (chronic): Secondary | ICD-10-CM | POA: Diagnosis not present

## 2024-02-24 DIAGNOSIS — M24452 Recurrent dislocation, left hip: Secondary | ICD-10-CM | POA: Diagnosis not present

## 2024-02-24 DIAGNOSIS — G4733 Obstructive sleep apnea (adult) (pediatric): Secondary | ICD-10-CM | POA: Diagnosis not present

## 2024-02-24 DIAGNOSIS — E538 Deficiency of other specified B group vitamins: Secondary | ICD-10-CM | POA: Diagnosis not present

## 2024-02-24 DIAGNOSIS — R000 Tachycardia, unspecified: Secondary | ICD-10-CM | POA: Diagnosis not present

## 2024-02-24 DIAGNOSIS — T84021A Dislocation of internal left hip prosthesis, initial encounter: Secondary | ICD-10-CM | POA: Diagnosis not present

## 2024-02-24 DIAGNOSIS — Z9181 History of falling: Secondary | ICD-10-CM | POA: Diagnosis not present

## 2024-02-24 DIAGNOSIS — S72142K Displaced intertrochanteric fracture of left femur, subsequent encounter for closed fracture with nonunion: Secondary | ICD-10-CM | POA: Diagnosis not present

## 2024-02-24 DIAGNOSIS — D72829 Elevated white blood cell count, unspecified: Secondary | ICD-10-CM | POA: Diagnosis not present

## 2024-02-24 DIAGNOSIS — Z8673 Personal history of transient ischemic attack (TIA), and cerebral infarction without residual deficits: Secondary | ICD-10-CM | POA: Diagnosis not present

## 2024-02-24 DIAGNOSIS — K5900 Constipation, unspecified: Secondary | ICD-10-CM | POA: Diagnosis not present

## 2024-02-24 DIAGNOSIS — E785 Hyperlipidemia, unspecified: Secondary | ICD-10-CM | POA: Diagnosis not present

## 2024-02-24 DIAGNOSIS — Z79899 Other long term (current) drug therapy: Secondary | ICD-10-CM | POA: Diagnosis not present

## 2024-02-24 DIAGNOSIS — Z87891 Personal history of nicotine dependence: Secondary | ICD-10-CM | POA: Diagnosis not present

## 2024-02-24 DIAGNOSIS — Z7982 Long term (current) use of aspirin: Secondary | ICD-10-CM | POA: Diagnosis not present

## 2024-02-24 LAB — CBC WITH DIFFERENTIAL/PLATELET
Abs Immature Granulocytes: 0.1 K/uL — ABNORMAL HIGH (ref 0.00–0.07)
Basophils Absolute: 0 K/uL (ref 0.0–0.1)
Basophils Relative: 0 %
Eosinophils Absolute: 0.2 K/uL (ref 0.0–0.5)
Eosinophils Relative: 2 %
HCT: 24.2 % — ABNORMAL LOW (ref 39.0–52.0)
Hemoglobin: 7.4 g/dL — ABNORMAL LOW (ref 13.0–17.0)
Immature Granulocytes: 1 %
Lymphocytes Relative: 10 %
Lymphs Abs: 1.1 K/uL (ref 0.7–4.0)
MCH: 28.1 pg (ref 26.0–34.0)
MCHC: 30.6 g/dL (ref 30.0–36.0)
MCV: 92 fL (ref 80.0–100.0)
Monocytes Absolute: 0.5 K/uL (ref 0.1–1.0)
Monocytes Relative: 4 %
Neutro Abs: 8.7 K/uL — ABNORMAL HIGH (ref 1.7–7.7)
Neutrophils Relative %: 83 %
Platelets: 352 K/uL (ref 150–400)
RBC: 2.63 MIL/uL — ABNORMAL LOW (ref 4.22–5.81)
RDW: 15.9 % — ABNORMAL HIGH (ref 11.5–15.5)
WBC: 10.6 K/uL — ABNORMAL HIGH (ref 4.0–10.5)
nRBC: 0 % (ref 0.0–0.2)

## 2024-02-24 LAB — BASIC METABOLIC PANEL WITH GFR
Anion gap: 8 (ref 5–15)
BUN: 6 mg/dL — ABNORMAL LOW (ref 8–23)
CO2: 30 mmol/L (ref 22–32)
Calcium: 7.9 mg/dL — ABNORMAL LOW (ref 8.9–10.3)
Chloride: 94 mmol/L — ABNORMAL LOW (ref 98–111)
Creatinine, Ser: 0.52 mg/dL — ABNORMAL LOW (ref 0.61–1.24)
GFR, Estimated: >60 mL/min (ref >60)
Glucose, Bld: 79 mg/dL (ref 70–99)
Potassium: 4.6 mmol/L (ref 3.5–5.1)
Sodium: 132 mmol/L — ABNORMAL LOW (ref 135–145)

## 2024-02-24 LAB — VITAMIN K1, SERUM: VITAMIN K1: <0.1 ng/mL — ABNORMAL LOW (ref 0.10–2.20)

## 2024-02-24 LAB — PROTIME-INR
INR: 1.1 (ref 0.8–1.2)
Prothrombin Time: 15.3 s — ABNORMAL HIGH (ref 11.4–15.2)

## 2024-02-24 LAB — PTT FACTOR INHIBITOR (MIXING STUDY): aPTT: 22.6 s — ABNORMAL LOW (ref 22.9–30.2)

## 2024-02-24 LAB — MAGNESIUM: Magnesium: 1.7 mg/dL (ref 1.7–2.4)

## 2024-02-24 MED ORDER — BOOST / RESOURCE BREEZE PO LIQD CUSTOM: 1.0000 | Freq: Three times a day (TID) | ORAL | Status: DC

## 2024-02-24 MED ORDER — ADULT MULTIVITAMIN W/MINERALS CH
1.0000 | ORAL_TABLET | Freq: Every day | ORAL | Status: DC
  Administered 2024-02-25 – 2024-02-26 (×2): 1 via ORAL
  Filled 2024-02-24 (×3): qty 1

## 2024-02-24 NOTE — Progress Notes (Signed)
 PROGRESS NOTE    Jonathon Anderson  FMW:[id number] DOB: 11-27-1955 DOA: 02/18/2024 PCP: Stephanie Charlene LITTIE, MD   Brief Narrative:  Jonathon Anderson is a 68 y.o. male with medical history significant for light stroke on ASA BID, HTN, HLD admitted to the hospital by the orthopedic service for closed reduction irrigation and debridement of left hip arthroplasty now status post wound VAC placement on 7/28 and hospitalist service/TRH was consulted 02/20/2024 due to hypoxia, anemia and vomiting. Patient endorsed some mild abdominal discomfort.  He denied any cough or shortness of breath.  Hemoglobin fallen to 6.6 , no evidence of any kind of blood loss, he denied any bright red blood in his emesis, or any blood in his stools.  He denied any prior history of GI bleeding.  Assessment & Plan:   Principal Problem:   Recurrent dislocation of left hip  Status post closed reduction left hip dislocation and irrigation debridement superficial wound dehiscence 02/18/24 With evidence of complex periprosthetic collection adjacent to the left hip arthroplasty which may be either concerning for infectious or hematoma.  Will defer to primary service.  Acute hypoxic respiratory failure:Was likely due to severe acute anemia.  Out of concern of PE, consulting hospitalist ordered CT angiogram of the chest which ruled out PE or any other lung pathology.  Acute blood loss anemia: Patient's hemoglobin preoperatively was 9.5, dropped significantly postoperatively to 5.5 on 02/20/2024 for which patient received 2 units of PRBC transfusion and 1 unit of FFP, aspirin  discontinued, hemoglobin 8.5 posttransfusion which dropped further but has now stabilized around 7.5, FOBT was ordered which is still pending but GI does not suspect GI bleed. patient started on Protonix  twice daily.  per GI, anemia is likely secondary to bleeding at the surgical site/at hip as seen on the CT scan especially in the setting of supratherapeutic INR.  No  hematemesis or hematochezia reports.  GI signed off.  Supratherapeutic INR/multiple liver cysts and hemangioma: As a part of workup for anemia and hypoxia, hospitalist ordered CT abdomen pelvis which showed incidental finding of A 3.7 x 2.2 cm heterogeneously enhancing lesion in the posterior right lobe of the liver is not characterized on this CT. Further characterization with MRI without and with contrast recommended.  MRI abdomen shows multiple liver cysts. A right hepatic lobe lesion with signal characteristics most consistent with hemangioma. Recommend follow-up to ensure stability.  Patient received multiple doses of vitamin K  due to INR around 7.1.  Oncology was consulted.  They agreed with the management.  Fortunately, his INR is now normalized at 1.1 since 02/23/2024.  Per oncology note, they will follow-up with him in few days.  Essential hypertension: Controlled.  Continue amlodipine .  Chronic mild hyponatremia: Baseline around 130, at baseline now.  PS: Hospitalist service was consulted for hypoxia which has resolved since couple of days.  Anemia stable, emesis resolved.  If hemoglobin drops to less than 7, he will need blood transfusion which we defer to primary service.  No other medical issue needs active attention or management by hospitalist service.  I plan to chart check tomorrow morning, if labs will look good, we will sign off.  Please let us  know if patient needs to be seen in person tomorrow.  DVT prophylaxis: SCDs Start: 02/18/24 1634 Place TED hose Start: 02/18/24 1634   Code Status: Full Code  Family Communication:  None present at bedside.  Plan of care discussed with patient in length and he/she verbalized understanding and agreed with it.  Status is: Inpatient Remains inpatient appropriate because: Disposition per primary service   Estimated body mass index is 34.87 kg/m as calculated from the following:   Height as of this encounter: 5' 7 (1.702 m).   Weight as of  this encounter: 101 kg.    Nutritional Assessment: Body mass index is 34.87 kg/m.SABRA Seen by dietician.  I agree with the assessment and plan as outlined below: Nutrition Status:        . Skin Assessment: I have examined the patient's skin and I agree with the wound assessment as performed by the wound care RN as outlined below:    Consultants:  TRH and GI  Procedures:  As above  Antimicrobials:  Anti-infectives (From admission, onward)    Start     Dose/Rate Route Frequency Ordered Stop   02/22/24 1400  ceFAZolin  (ANCEF ) IVPB 2g/100 mL premix       Note to Pharmacy: Likely go home on one week cefadroxil    2 g 200 mL/hr over 30 Minutes Intravenous Every 8 hours 02/22/24 1259     02/19/24 0800  ceFAZolin  (ANCEF ) IVPB 2g/100 mL premix        2 g 200 mL/hr over 30 Minutes Intravenous Every 8 hours 02/19/24 0548 02/21/24 2352   02/18/24 1800  ceFAZolin  (ANCEF ) IVPB 2g/100 mL premix        2 g 200 mL/hr over 30 Minutes Intravenous Every 6 hours 02/18/24 1633 02/18/24 2359   02/18/24 1305  vancomycin  (VANCOCIN ) powder  Status:  Discontinued          As needed 02/18/24 1305 02/18/24 1409   02/18/24 1045  ceFAZolin  (ANCEF ) IVPB 2g/100 mL premix        2 g 200 mL/hr over 30 Minutes Intravenous On call to O.R. 02/18/24 1043 02/18/24 1233   02/18/24 0000  sulfamethoxazole -trimethoprim  (BACTRIM  DS) 800-160 MG tablet        1 tablet Oral 2 times daily 02/18/24 1014 03/03/24 2359         Subjective: Patient seen and examined, he has no complaints.  Objective: Vitals:   02/23/24 1041 02/23/24 1412 02/24/24 0530 02/24/24 0838  BP: 113/66 109/65 120/70 112/73  Pulse: 68 70 76 80  Resp: 17 17 16    Temp: 98.1 F (36.7 C) 97.9 F (36.6 C) 98.2 F (36.8 C)   TempSrc:   Oral   SpO2: 98% 95% 99%   Weight:      Height:        Intake/Output Summary (Last 24 hours) at 02/24/2024 1027 Last data filed at 02/24/2024 0641 Gross per 24 hour  Intake 1438.62 ml  Output 1200 ml  Net  238.62 ml   Filed Weights   02/18/24 0910 02/18/24 1047  Weight: 101 kg 101 kg    Examination:  General exam: Appears calm and comfortable  Respiratory system: Clear to auscultation. Respiratory effort normal. Cardiovascular system: S1 & S2 heard, RRR. No JVD, murmurs, rubs, gallops or clicks. No pedal edema. Gastrointestinal system: Abdomen is nondistended, soft and nontender. No organomegaly or masses felt. Normal bowel sounds heard. Central nervous system: Alert and oriented. No focal neurological deficits. Extremities: Symmetric 5 x 5 power. Skin: No rashes, lesions or ulcers.  Psychiatry: Judgement and insight appear normal. Mood & affect appropriate.   Data Reviewed: I have personally reviewed following labs and imaging studies  CBC: Recent Labs  Lab 02/18/24 1045 02/19/24 0557 02/20/24 0334 02/20/24 1241 02/21/24 0941 02/21/24 1650 02/22/24 0617 02/23/24 0430 02/24/24 9173  WBC 10.5   < > 12.3*  --   --  9.4 8.1 8.8 10.6*  NEUTROABS 9.1*  --   --   --   --  7.4  --   --  8.7*  HGB 9.5*   < > 6.6*   < > 8.5* 7.5* 7.4* 7.8* 7.4*  HCT 31.4*   < > 21.4*   < > 27.5* 23.6* 24.2* 24.8* 24.2*  MCV 90.2   < > 89.5  --   --  86.1 88.6 89.9 92.0  PLT 558*   < > 485*  --   --  344 340 320 352   < > = values in this interval not displayed.   Basic Metabolic Panel: Recent Labs  Lab 02/18/24 1045 02/19/24 0330 02/24/24 0826  NA 135 134* 132*  K 4.6 4.9 4.6  CL 95* 92* 94*  CO2 28 31 30   GLUCOSE 96 134* 79  BUN 11 11 6*  CREATININE 0.82 0.87 0.52*  CALCIUM  8.2* 7.7* 7.9*  MG  --   --  1.7   GFR: Estimated Creatinine Clearance: 98.7 mL/min (A) (by C-G formula based on SCr of 0.52 mg/dL (L)). Liver Function Tests: Recent Labs  Lab 02/18/24 1045  AST 13*  ALT 8  ALKPHOS 131*  BILITOT 1.0  PROT 6.9  ALBUMIN  2.1*   No results for input(s): LIPASE, AMYLASE in the last 168 hours. No results for input(s): AMMONIA in the last 168 hours. Coagulation  Profile: Recent Labs  Lab 02/21/24 1343 02/22/24 0617 02/22/24 1812 02/23/24 0430 02/24/24 0826  INR 7.9* 6.1* 1.4* 1.1 1.1   Cardiac Enzymes: No results for input(s): CKTOTAL, CKMB, CKMBINDEX, TROPONINI in the last 168 hours. BNP (last 3 results) No results for input(s): PROBNP in the last 8760 hours. HbA1C: No results for input(s): HGBA1C in the last 72 hours. CBG: Recent Labs  Lab 02/20/24 0700  GLUCAP 121*   Lipid Profile: No results for input(s): CHOL, HDL, LDLCALC, TRIG, CHOLHDL, LDLDIRECT in the last 72 hours. Thyroid Function Tests: No results for input(s): TSH, T4TOTAL, FREET4, T3FREE, THYROIDAB in the last 72 hours. Anemia Panel: No results for input(s): VITAMINB12, FOLATE, FERRITIN, TIBC, IRON , RETICCTPCT in the last 72 hours. Sepsis Labs: No results for input(s): PROCALCITON, LATICACIDVEN in the last 168 hours.  Recent Results (from the past 240 hours)  Aerobic/Anaerobic Culture w Gram Stain (surgical/deep wound)     Status: None   Collection Time: 02/18/24  1:03 PM   Specimen: Soft Tissue, Other  Result Value Ref Range Status   Specimen Description   Final    TISSUE Performed at [facility], [address] [place], [place] [address]    Special Requests   Final    NONE Performed at [facility], [address] [place], [place] [address]    Gram Stain NO WBC SEEN NO ORGANISMS SEEN   Final   Culture   Final    RARE STAPHYLOCOCCUS AUREUS SUSCEPTIBILITIES PERFORMED ON PREVIOUS CULTURE WITHIN THE LAST 5 DAYS. NO ANAEROBES ISOLATED Performed at [facility], [address] [place], [place] [address]    Report Status 02/23/2024 FINAL  Final  Aerobic/Anaerobic Culture w Gram Stain (surgical/deep wound)     Status: None   Collection Time: 02/18/24  1:03 PM   Specimen: Soft Tissue, Other  Result Value Ref Range Status   Specimen Description   Final    TISSUE Performed at  [facility],  [address] [place], [place] [address]    Special Requests   Final    NONE Performed at [facility], [address] [place], [place] [address]    Gram Stain NO WBC SEEN NO ORGANISMS SEEN   Final   Culture   Final    RARE STAPHYLOCOCCUS AUREUS NO ANAEROBES ISOLATED Performed at [facility], [address], [place], [place] [address]    Report Status 02/23/2024 FINAL  Final   Organism ID, Bacteria STAPHYLOCOCCUS AUREUS  Final      Susceptibility   Staphylococcus aureus - MIC*    CIPROFLOXACIN <=0.5 SENSITIVE Sensitive     ERYTHROMYCIN <=0.25 SENSITIVE Sensitive     GENTAMICIN <=0.5 SENSITIVE Sensitive     OXACILLIN 0.5 SENSITIVE Sensitive     TETRACYCLINE <=1 SENSITIVE Sensitive     VANCOMYCIN  1 SENSITIVE Sensitive     TRIMETH /SULFA  <=10 SENSITIVE Sensitive     CLINDAMYCIN <=0.25 SENSITIVE Sensitive     RIFAMPIN <=0.5 SENSITIVE Sensitive     Inducible Clindamycin NEGATIVE Sensitive     LINEZOLID 2 SENSITIVE Sensitive     * RARE STAPHYLOCOCCUS AUREUS     Radiology Studies: No results found.   Scheduled Meds:  amLODipine   10 mg Oral Daily   atorvastatin   20 mg Oral QHS   calcium  carbonate  1 tablet Oral BID WC   docusate sodium   100 mg Oral BID   feeding supplement  237 mL Oral BID BM   ferrous sulfate   325 mg Oral Q breakfast   folic acid   1 mg Oral Daily   metoprolol  tartrate  100 mg Oral BID   pantoprazole  (PROTONIX ) IV  40 mg Intravenous Q12H   senna  1 tablet Oral BID   Continuous Infusions:   ceFAZolin  (ANCEF ) IV 2 g (02/24/24 0549)   lactated ringers  75 mL/hr at 02/24/24 0846   promethazine  (PHENERGAN ) injection (IM or IVPB) Stopped (02/20/24 1608)     LOS: 5 days   Fredia Skeeter, MD [facility]  02/24/2024, 10:27 AM   *Please note that this is a verbal dictation therefore any spelling or grammatical errors are due to the Dragon Medical One system interpretation.  Please page via Amion and do  not message via secure chat for urgent patient care matters. Secure chat can be used for non urgent patient care matters.  How to contact the TRH Attending or Consulting provider 7A - 7P or covering provider during after hours 7P -7A, for this patient?  Check the care team in [facility] and look for a) attending/consulting TRH provider listed and b) the TRH team listed. Page or secure chat 7A-7P. Log into [email or web address] and use [facility]'s universal password to access. If you do not have the password, please contact the hospital operator. Locate the TRH provider you are looking for under [facility] and page to a number that you can be directly reached. If you still have difficulty reaching the provider, please page the [facility] (Director on Call) for the Hospitalists listed on amion for assistance.

## 2024-02-24 NOTE — Progress Notes (Signed)
 6 Days Post-Op Procedure(s) (LRB): IRRIGATION AND DEBRIDEMENT WOUND (Left) APPLICATION, WOUND VAC (Left) CLOSED REDUCTION, HIP  Subjective: Feels a little better than he did earlier this week. Working with PT this morning.   Still has a decreased appetite. He ate bone broth with sugar yesterday. Nutrition consult ordered yesterday.    7/30 received 2 units PRBCs, 7/31 Hgb up to 8.4 after 2 units of blood transfusion, then back down to 7.5 around 1700. 8/2 Hgb 7.8  INR 1.1 yesterday..   Objective:   VITALS:   Vitals:   02/23/24 0608 02/23/24 1041 02/23/24 1412 02/24/24 0530  BP: (!) 110/98 113/66 109/65 120/70  Pulse: 72 68 70 76  Resp: 16 17 17 16   Temp: 98.5 F (36.9 C) 98.1 F (36.7 C) 97.9 F (36.6 C) 98.2 F (36.8 C)  TempSrc:    Oral  SpO2: 99% 98% 95% 99%  Weight:      Height:        AAOx4, resting in NAD Sensation intact distally Intact pulses distally Dorsiflexion/Plantar flexion intact Incision: dressing C/D/I Compartment soft Wound VAC holding suction 120 mmHg and no output in canister Wiggles toes appropriately   Lab Results  Component Value Date   WBC 8.8 02/23/2024   HGB 7.8 (L) 02/23/2024   HCT 24.8 (L) 02/23/2024   MCV 89.9 02/23/2024   PLT 320 02/23/2024   BMET    Component Value Date/Time   NA 134 (L) 02/19/2024 0330   K 4.9 02/19/2024 0330   CL 92 (L) 02/19/2024 0330   CO2 31 02/19/2024 0330   GLUCOSE 134 (H) 02/19/2024 0330   BUN 11 02/19/2024 0330   CREATININE 0.87 02/19/2024 0330   CALCIUM  7.7 (L) 02/19/2024 0330   GFRNONAA >60 02/19/2024 0330      Xray: Reduced proximal femoral replacement arthroplasty in good alignment without adverse features  Assessment/Plan: 6 Days Post-Op   Principal Problem:   Recurrent dislocation of left hip  Status post closed reduction left hip dislocation and irrigation debridement superficial wound dehiscence 02/18/24  7/30: Plan for transfusion with Hgb of 6.6.  2 units ordered.   Details of this morning events discussed with nursing staff and rapid response team.  Plan for hospitalist consult to evaluate for post-operative concerns such as pulmonary embolism or bowel obstruction.   55: Spoke with Dr. Roxane of hospitalist team regarding patient case, shared similar concerns.  Plan for further workup likely including CTA.  7/31: Follow-up repeat INR result this morning.  May need intervention if the 7.1 result is felt to be real.  Appreciate input from medicine and GI.  Does have a fluid collection around the left hip likely traumatic hematoma due to the dislocation.  Could be exacerbated by the INR result if real.  Would improve if the INR is corrected..  8/1: Still unclear etiology so far, however consulted with general surgery, GI, and heme/onc (see separate notes).  May give another 5mg  Vit K IV if INR still > 4-5 per Heme/onc recommendation.  GI symptoms have lessened but still unclear whether he's having abdominal pain, patient poor historian.  PLTs okay yesterday.  Hgb downtrending again yesterday PM.  May reach back out to Heme/Onc depending on pending labs.  Discussed plan with patient at length.  8/2: INR improved and WNL today. Will order nutrition consult due to lack of appetite.   8/3: no changes. Labs pending this morning.    Post op recs: WB: WBAT LLE with strict posterior hip  precautions Abx: ancef  x23 hours post op, dc on bactrim  Imaging: PACU xrays Dressing: Prevena incisional wound VAC DVT prophylaxis: Aspirin  81 mg Follow up: 1 week after surgery for a wound check with Dr. Edna at [facility].  Address: [address], [place], [place] [address]  Office Phone: [phone]    Aleck LOISE Stalling 02/24/2024, 7:27 AM    Contact information:   [phone] 7am-5pm epic message Dr. Edna, or call office for patient follow up: [phone] After hours and holidays please check [email or web address] for group call information for  [facility]

## 2024-02-24 NOTE — Progress Notes (Signed)
 Inpatient Rehab Admissions Coordinator:    Pt. Appears to be a potential CIR candidate. I will enter consult.  Leita Kleine, MS, CCC-SLP Rehab Admissions Coordinator  [phone] (celll) [phone] (office)

## 2024-02-24 NOTE — Progress Notes (Signed)
 Physical Therapy Treatment Patient Details Name: Jonathon Anderson MRN: [id number] DOB: [birth year] Today's Date: 02/24/2024   History of Present Illness 68 yo male  Closed reduction left total hip arthroplasty, left hip wound debridement, Application of Kerecis graft, application of incisional wound VAC    PMH: multiple hip dislocations, S/p L closed reduction L hip attempt in OR 6/28, not successful; s/p L open hip reduction 6/30. On 6/18, S/p conversion to L THA with posterior hip precautions given L intertrochanteric femur fx nonunion, recurrent L hip surgery since 2023, HTN, HLD, TIA, CVA with residual LUE n/t.    PT Comments  Pt continues very cooperative and progressing slowly but steadily with mobility.  Pt continues to require significant assist of 2 for safe performance of all basic mobility tasks but with slowly improving activity tolerance.  Patient will benefit from intensive inpatient follow-up therapy, >3 hours/day  to maximize IND and safety for return home with primary assist being provided by sister.    If plan is discharge home, recommend the following: A lot of help with walking and/or transfers;A lot of help with bathing/dressing/bathroom;Assistance with cooking/housework;Help with stairs or ramp for entrance;Assist for transportation   Can travel by private vehicle     No  Equipment Recommendations  None recommended by PT    Recommendations for Other Services       Precautions / Restrictions Precautions Precautions: Posterior Hip Precaution Booklet Issued: Yes (comment) Recall of Precautions/Restrictions: Intact Precaution/Restrictions Comments: wound vac Required Braces or Orthoses: Other Brace Other Brace: abductor brace Restrictions Weight Bearing Restrictions Per Provider Order: Yes LLE Weight Bearing Per Provider Order: Weight bearing as tolerated Other Position/Activity Restrictions: with brace in place     Mobility  Bed Mobility Overal bed mobility:  Needs Assistance Bed Mobility: Sit to Supine     Supine to sit: Min assist, Mod assist, +2 for physical assistance, +2 for safety/equipment Sit to supine: Min assist, Mod assist, +2 for physical assistance, +2 for safety/equipment   General bed mobility comments: Increased time, use of bed pad to assist. Cues for use of R LE to self assist and adherence to THP    Transfers Overall transfer level: Needs assistance Equipment used: Rolling walker (2 wheels) Transfers: Sit to/from Stand Sit to Stand: Min assist, Mod assist, +2 physical assistance, +2 safety/equipment, From elevated surface           General transfer comment: cues for LE management, use of UEs to self assist and adherence to THP;  Physical assist to bring wt up and fwd and to balance in initial standing    Ambulation/Gait Ambulation/Gait assistance: Min assist, +2 physical assistance, +2 safety/equipment Gait Distance (Feet): 14 Feet Assistive device: Rolling walker (2 wheels) Gait Pattern/deviations: Step-to pattern, Decreased step length - right, Decreased step length - left, Shuffle, Trunk flexed, Wide base of support Gait velocity: decreased     General Gait Details: cues for sequence, posture and position from RW - distance ltd by fatigue   Stairs             Wheelchair Mobility     Tilt Bed    Modified Rankin (Stroke Patients Only)       Balance Overall balance assessment: Needs assistance Sitting-balance support: Feet supported, Single extremity supported Sitting balance-Leahy Scale: Fair Sitting balance - Comments: often leaning to R side and BUE support Postural control: Right lateral lean Standing balance support: Bilateral upper extremity supported Standing balance-Leahy Scale: Poor Standing balance comment: requires BUE support  Communication Communication Communication: Impaired Factors Affecting Communication: Hearing impaired;Other  (comment)  Cognition Arousal: Alert Behavior During Therapy: WFL for tasks assessed/performed   PT - Cognitive impairments: No apparent impairments                       PT - Cognition Comments: AxO x 3 pleasant and following all commands.  Slow to move and easily fatigues.  Progressing.  Pt admits to a Sedentary Lifestyle and Tobacco use. Following commands: Intact      Cueing Cueing Techniques: Verbal cues, Gestural cues  Exercises      General Comments        Pertinent Vitals/Pain Pain Assessment Pain Assessment: 0-10 Pain Score: 4  Pain Location: L hip Pain Descriptors / Indicators: Aching, Sore Pain Intervention(s): Limited activity within patient's tolerance, Monitored during session, Premedicated before session, Ice applied    Home Living                          Prior Function            PT Goals (current goals can now be found in the care plan section) Acute Rehab PT Goals Patient Stated Goal: Regain IND PT Goal Formulation: With patient Time For Goal Achievement: 03/07/24 Potential to Achieve Goals: Fair Progress towards PT goals: Progressing toward goals    Frequency    7X/week      PT Plan      Co-evaluation              AM-PAC PT 6 Clicks Mobility   Outcome Measure  Help needed turning from your back to your side while in a flat bed without using bedrails?: A Lot Help needed moving from lying on your back to sitting on the side of a flat bed without using bedrails?: A Lot Help needed moving to and from a bed to a chair (including a wheelchair)?: A Lot Help needed standing up from a chair using your arms (e.g., wheelchair or bedside chair)?: A Lot Help needed to walk in hospital room?: A Lot Help needed climbing 3-5 steps with a railing? : Total 6 Click Score: 11    End of Session Equipment Utilized During Treatment: Gait belt Activity Tolerance: Patient limited by fatigue Patient left: in bed;with call  bell/phone within reach;with bed alarm set Nurse Communication: Mobility status PT Visit Diagnosis: Difficulty in walking, not elsewhere classified (R26.2)     Time: 8658-8597 PT Time Calculation (min) (ACUTE ONLY): 21 min  Charges:    $Therapeutic Activity: 8-22 mins PT General Charges $$ ACUTE PT VISIT: 1 Visit                     [facility] PT Acute Rehabilitation Services Office [phone]    [facility] 02/24/2024, 4:57 PM

## 2024-02-24 NOTE — Progress Notes (Signed)
 Initial Nutrition Assessment  DOCUMENTATION CODES:   Obesity unspecified  INTERVENTION:   Boost Breeze po TID, each supplement provides 250 kcal and 9 grams of protein  Ensure Plus High Protein po TID with diet advancement, each supplement provides 350 kcal and 20 grams of protein  MVI po daily   Pt at high refeed risk; recommend monitor potassium, magnesium  and phosphorus labs daily until stable  Daily weights   NUTRITION DIAGNOSIS:   Inadequate oral intake related to acute illness as evidenced by other (comment) (pt on clear liquid diet).  GOAL:   Patient will meet greater than or equal to 90% of their needs  MONITOR:   PO intake, Supplement acceptance, Labs, Diet advancement, Weight trends, I & O's, Skin  REASON FOR ASSESSMENT:   Consult Assessment of nutrition requirement/status, Wound healing  ASSESSMENT:   68 y/o male with h/o HTN, HLD, stroke and hip fracture s/p open reduction of a left proximal femoral replacement 01/21/2024 who is admitted with wound infection and dislocation of his left hip prosthesis now s/p closed reduction left total hip arthroplasty, left hip wound debridement, application of Kerecis graft and application of incisional wound VAC 7/28 complicated by post op nausea, vomiting, supratherapeutic INR, incidental finding of liver cysts and hemangioma and ABLA.  RD working remotely.  RD unable to reach pt by phone after numerous attempts; RD keeps getting busy signal. Per chart review, pt with poor appetite and oral intake since admission. Pt has remained on an NPO/clear liquid diet since 7/30. Pt is documented to have eaten 100% of his breakfast and lunch today. RD will add supplements and MVI to help pt meet his estimated needs. RD will reach out to MD about diet advancement. Pt is likely at high refeed risk. Per chart, pt appears weight stable at baseline but has not been weighed since admission. RD will obtain exam and history at follow up.    Medications reviewed and include: colace, ferrous sulfate , folic acid , protonix , senokot, cefazolin , LRS @75ml /hr  Labs reviewed: Na 132(L), K 4.6 wnl, BUN 6(L), creat 0.52(L), Mg 1.7 wnl Folate 5.4(L), B12 385 wnl- 7/1 Wbc- 10.6(H), Hgb 7.4(L), Hct 24.2(L)  NUTRITION - FOCUSED PHYSICAL EXAM: Unable to perform at this time   Diet Order:   Diet Order             Diet clear liquid Room service appropriate? Yes; Fluid consistency: Thin  Diet effective now                  EDUCATION NEEDS:   Not appropriate for education at this time  Skin:  Skin Assessment: Reviewed RN Assessment (incision hip)  Last BM:  7/30  Height:   Ht Readings from Last 1 Encounters:  02/18/24 5' 7 (1.702 m)    Weight:   Wt Readings from Last 1 Encounters:  02/18/24 101 kg    Ideal Body Weight:  67.2 kg  BMI:  Body mass index is 34.87 kg/m.  Estimated Nutritional Needs:   Kcal:  2000-2300kcal/day  Protein:  100-115g/day  Fluid:  1.7-2.0L/day  Augustin Shams MS, RD, LDN If unable to be reached, please send secure chat to RD inpatient available from 8:00a-4:00p daily

## 2024-02-24 NOTE — TOC Initial Note (Signed)
 Transition of Care [facility]) - Initial/Assessment Note    Patient Details  Name: Jonathon Anderson MRN: [id number] Date of Birth: 04-29-1956  Transition of Care [facility]) CM/SW Contact:    Sheri ONEIDA Sharps, LCSW Phone Number: 02/24/2024, 10:16 AM  Clinical Narrative:                 Pt recommended for SNF. CSW met pt at bedside to discuss. Pt declines SNF stating he wishes to go home w/ [facility] as originally planned.     Barriers to Discharge: No Barriers Identified   Patient Goals and CMS Choice Patient states their goals for this hospitalization and ongoing recovery are:: return home          Expected Discharge Plan and Services                         DME Arranged: N/A DME Agency: NA       HH Arranged: PT HH Agency: [facility] Date HH Agency Contacted: 02/19/24   Representative spoke with at [facility] Agency: Garry  Prior Living Arrangements/Services                       Activities of Daily Living   ADL Screening (condition at time of admission) Independently performs ADLs?: Yes (appropriate for developmental age) Is the patient deaf or have difficulty hearing?: No Does the patient have difficulty seeing, even when wearing glasses/contacts?: No Does the patient have difficulty concentrating, remembering, or making decisions?: No  Permission Sought/Granted                  Emotional Assessment              Admission diagnosis:  Left hip pain [M25.552] Recurrent dislocation of left hip [M24.452] Patient Active Problem List   Diagnosis Date Noted   Recurrent dislocation of left hip 02/18/2024   Elevated blood pressure reading 01/23/2024   Displacement of internal left hip prosthesis (HCC) 01/22/2024   Essential hypertension 01/22/2024   Hypokalemia 01/20/2024   Dislocated hip, left, initial encounter (HCC) 01/18/2024   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 [place], [place] - [address] [address] [place] [address] Phone: [phone] Fax: [phone]     Social Drivers of Health (SDOH) Social History: SDOH Screenings   Food Insecurity: No Food Insecurity (02/18/2024)  Housing: Low Risk  (02/18/2024)  Transportation Needs: No Transportation Needs (02/18/2024)  Utilities: Not At Risk (02/18/2024)  Social Connections: Moderately Integrated (02/18/2024)  Tobacco Use: Medium Risk (02/18/2024)   SDOH Interventions:     Readmission Risk Interventions    02/19/2024    3:17 PM  Readmission Risk Prevention Plan  Post Dischage Appt Complete  Medication Screening Complete  Transportation Screening Complete

## 2024-02-24 NOTE — Progress Notes (Signed)
 Physical Therapy Treatment Patient Details Name: Jonathon Anderson MRN: [id number] DOB: March 12, 1956 Today's Date: 02/24/2024   History of Present Illness 68 yo male  Closed reduction left total hip arthroplasty, left hip wound debridement, Application of Kerecis graft, application of incisional wound VAC    PMH: multiple hip dislocations, S/p L closed reduction L hip attempt in OR 6/28, not successful; s/p L open hip reduction 6/30. On 6/18, S/p conversion to L THA with posterior hip precautions given L intertrochanteric femur fx nonunion, recurrent L hip surgery since 2023, HTN, HLD, TIA, CVA with residual LUE n/t.    PT Comments  Pt continues very cooperative but progressing slowly with mobility and continues to require significant assist of 2 for safe performance of all basic mobility tasks and with limited activity tolerance.  Patient will benefit from continued inpatient follow up therapy, <3 hours/day to maximize IND and safety prior to return home with limited assist.    If plan is discharge home, recommend the following: A lot of help with walking and/or transfers;A lot of help with bathing/dressing/bathroom;Assistance with cooking/housework;Help with stairs or ramp for entrance;Assist for transportation   Can travel by private vehicle     No  Equipment Recommendations  None recommended by PT    Recommendations for Other Services       Precautions / Restrictions Precautions Precautions: Posterior Hip Precaution Booklet Issued: Yes (comment) Recall of Precautions/Restrictions: Intact Precaution/Restrictions Comments: wound vac Required Braces or Orthoses: Other Brace Other Brace: abductor brace Restrictions Weight Bearing Restrictions Per Provider Order: No LLE Weight Bearing Per Provider Order: Weight bearing as tolerated Other Position/Activity Restrictions: with brace in place     Mobility  Bed Mobility Overal bed mobility: Needs Assistance Bed Mobility: Supine to Sit      Supine to sit: Min assist, Mod assist, +2 for physical assistance, +2 for safety/equipment     General bed mobility comments: Increased time, use of bed pad to assist. Cues for use of R LE to self assist and adherence to THP    Transfers Overall transfer level: Needs assistance Equipment used: Rolling walker (2 wheels) Transfers: Sit to/from Stand Sit to Stand: Min assist, Mod assist, +2 physical assistance, +2 safety/equipment, From elevated surface                Ambulation/Gait Ambulation/Gait assistance: Min assist, +2 physical assistance, +2 safety/equipment Gait Distance (Feet): 12 Feet Assistive device: Rolling walker (2 wheels) Gait Pattern/deviations: Step-to pattern, Decreased step length - right, Decreased step length - left, Shuffle, Trunk flexed, Wide base of support Gait velocity: decreased     General Gait Details: cues for sequence, posture and position from RW - distance ltd by fatigue   Stairs             Wheelchair Mobility     Tilt Bed    Modified Rankin (Stroke Patients Only)       Balance Overall balance assessment: Needs assistance Sitting-balance support: Feet supported, Single extremity supported Sitting balance-Leahy Scale: Fair Sitting balance - Comments: often leaning to R side and BUE support Postural control: Right lateral lean Standing balance support: Bilateral upper extremity supported Standing balance-Leahy Scale: Poor Standing balance comment: requires BUE support                            Communication Communication Communication: Impaired Factors Affecting Communication: Hearing impaired;Other (comment)  Cognition Arousal: Alert Behavior During Therapy: WFL for tasks assessed/performed  PT - Cognitive impairments: No apparent impairments                       PT - Cognition Comments: AxO x 3 pleasant and following all commands.  Slow to move and easily fatigues.  Progressing.  Pt  admits to a Sedentary Lifestyle and Tobacco use. Following commands: Intact      Cueing Cueing Techniques: Verbal cues, Gestural cues  Exercises      General Comments        Pertinent Vitals/Pain Pain Assessment Pain Assessment: 0-10 Pain Score: 4  Pain Location: L hip Pain Descriptors / Indicators: Aching, Sore, Operative site guarding Pain Intervention(s): Limited activity within patient's tolerance, Monitored during session, Premedicated before session, Ice applied    Home Living                          Prior Function            PT Goals (current goals can now be found in the care plan section) Acute Rehab PT Goals Patient Stated Goal: Regain IND PT Goal Formulation: With patient Time For Goal Achievement: 03/07/24 Potential to Achieve Goals: Fair Progress towards PT goals: Progressing toward goals    Frequency    7X/week      PT Plan      Co-evaluation              AM-PAC PT 6 Clicks Mobility   Outcome Measure  Help needed turning from your back to your side while in a flat bed without using bedrails?: A Lot Help needed moving from lying on your back to sitting on the side of a flat bed without using bedrails?: A Lot Help needed moving to and from a bed to a chair (including a wheelchair)?: A Lot Help needed standing up from a chair using your arms (e.g., wheelchair or bedside chair)?: A Lot Help needed to walk in hospital room?: A Lot Help needed climbing 3-5 steps with a railing? : Total 6 Click Score: 11    End of Session Equipment Utilized During Treatment: Gait belt Activity Tolerance: Patient limited by fatigue Patient left: in chair;with call bell/phone within reach;with chair alarm set Nurse Communication: Mobility status PT Visit Diagnosis: Difficulty in walking, not elsewhere classified (R26.2)     Time: 9152-9088 PT Time Calculation (min) (ACUTE ONLY): 24 min  Charges:    $Gait Training: 8-22 mins $Therapeutic  Activity: 8-22 mins PT General Charges $$ ACUTE PT VISIT: 1 Visit                    [facility] PT Acute Rehabilitation Services Office [phone]     Rickiya Picariello 02/24/2024, 1:02 PM

## 2024-02-25 LAB — BASIC METABOLIC PANEL WITH GFR
Anion gap: 10 (ref 5–15)
BUN: <5 mg/dL — ABNORMAL LOW (ref 8–23)
CO2: 28 mmol/L (ref 22–32)
Calcium: 8 mg/dL — ABNORMAL LOW (ref 8.9–10.3)
Chloride: 93 mmol/L — ABNORMAL LOW (ref 98–111)
Creatinine, Ser: 0.47 mg/dL — ABNORMAL LOW (ref 0.61–1.24)
GFR, Estimated: >60 mL/min (ref >60)
Glucose, Bld: 97 mg/dL (ref 70–99)
Potassium: 3.9 mmol/L (ref 3.5–5.1)
Sodium: 131 mmol/L — ABNORMAL LOW (ref 135–145)

## 2024-02-25 LAB — CBC
HCT: 28.5 % — ABNORMAL LOW (ref 39.0–52.0)
Hemoglobin: 8.7 g/dL — ABNORMAL LOW (ref 13.0–17.0)
MCH: 27.4 pg (ref 26.0–34.0)
MCHC: 30.5 g/dL (ref 30.0–36.0)
MCV: 89.9 fL (ref 80.0–100.0)
Platelets: 430 K/uL — ABNORMAL HIGH (ref 150–400)
RBC: 3.17 MIL/uL — ABNORMAL LOW (ref 4.22–5.81)
RDW: 15.8 % — ABNORMAL HIGH (ref 11.5–15.5)
WBC: 11.9 K/uL — ABNORMAL HIGH (ref 4.0–10.5)
nRBC: 0 % (ref 0.0–0.2)

## 2024-02-25 LAB — PT FACTOR INHIBITOR (MIXING STUDY)
1 HR INCUB PT 1:1NP: 12.2 s — ABNORMAL HIGH (ref 9.1–12.0)
PT 1:1NP: 13.4 s — ABNORMAL HIGH (ref 9.1–12.0)
PT: 19.4 s — ABNORMAL HIGH (ref 9.1–12.0)

## 2024-02-25 LAB — MAGNESIUM: Magnesium: 1.6 mg/dL — ABNORMAL LOW (ref 1.7–2.4)

## 2024-02-25 LAB — PHOSPHORUS: Phosphorus: 3.2 mg/dL (ref 2.5–4.6)

## 2024-02-25 MED ORDER — MAGNESIUM SULFATE 4 GM/100ML IV SOLN
4.0000 g | Freq: Once | INTRAVENOUS | Status: AC
  Administered 2024-02-25: 4 g via INTRAVENOUS
  Filled 2024-02-25: qty 100

## 2024-02-25 MED ORDER — CEFADROXIL 500 MG PO CAPS: 500.0000 mg | ORAL_CAPSULE | Freq: Two times a day (BID) | ORAL | 0 refills | Status: DC

## 2024-02-25 MED ORDER — ENSURE PLUS HIGH PROTEIN PO LIQD
237.0000 mL | Freq: Two times a day (BID) | ORAL | Status: DC
  Administered 2024-02-25: 237 mL via ORAL

## 2024-02-25 NOTE — Progress Notes (Signed)
    7 Days Post-Op Procedure(s) (LRB): IRRIGATION AND DEBRIDEMENT WOUND (Left) APPLICATION, WOUND VAC (Left) CLOSED REDUCTION, HIP  Subjective: Patient doing well this morning. Has mobilized some over the weekend. INR improved to 1.1 and Hgb stable. Diet advanced this morning, GI signed off over the weekend. Hopeful for discharge soon.   Objective:   VITALS:   Vitals:   02/24/24 0838 02/24/24 1323 02/24/24 2027 02/25/24 0710  BP: 112/73 (!) 101/56 111/61 120/66  Pulse: 80 78 83 80  Resp:  17 18 16   Temp:  98.7 F (37.1 C) 98.1 F (36.7 C) 98 F (36.7 C)  TempSrc:  Oral    SpO2:  93% 99% 99%  Weight:      Height:        AAOx4, resting in NAD Sensation intact distally Intact pulses distally Dorsiflexion/Plantar flexion intact Incision: dressing C/D/I Compartment soft Wound VAC holding suction 120 mmHg and no output in canister Left thigh tender to palpation, no erythema or swelling or bruising   Lab Results  Component Value Date   WBC 10.6 (H) 02/24/2024   HGB 7.4 (L) 02/24/2024   HCT 24.2 (L) 02/24/2024   MCV 92.0 02/24/2024   PLT 352 02/24/2024   BMET    Component Value Date/Time   NA 131 (L) 02/25/2024 0329   K 3.9 02/25/2024 0329   CL 93 (L) 02/25/2024 0329   CO2 28 02/25/2024 0329   GLUCOSE 97 02/25/2024 0329   BUN <5 (L) 02/25/2024 0329   CREATININE 0.47 (L) 02/25/2024 0329   CALCIUM  8.0 (L) 02/25/2024 0329   GFRNONAA >60 02/25/2024 0329      Xray: Reduced proximal femoral replacement arthroplasty in good alignment without adverse features  Assessment/Plan: 7 Days Post-Op   Principal Problem:   Recurrent dislocation of left hip  Status post closed reduction left hip dislocation and irrigation debridement superficial wound dehiscence 02/18/24  Post op recs: WB: WBAT LLE with strict posterior hip precautions Abx: ancef  x23 hours post op, dc on bactrim  Imaging: PACU xrays Dressing: Prevena incisional wound VAC DVT prophylaxis: Aspirin  81  mg Follow up: 1 week after surgery for a wound check with Dr. Edna at [facility].  Address: [address], [place], [place] [address]  Office Phone: [phone]    Ceri Mayer A Tarini Carrier 02/25/2024, 7:30 AM    Contact information:   Weekdays 7am-5pm epic message Dr. Edna, or call office for patient follow up: [phone] After hours and holidays please check [email or web address] for group call information for [facility]

## 2024-02-25 NOTE — Progress Notes (Signed)
 Hematology brief note   Patient's PT/INR has been back to normal, mixing study was unremarkable.  I will sign off for now.  Please call us  back if needed.  Onita Mattock  02/25/2024

## 2024-02-25 NOTE — Plan of Care (Signed)
   Problem: Pain Management: Goal: Pain level will decrease with appropriate interventions Outcome: Progressing

## 2024-02-25 NOTE — Progress Notes (Signed)
 Chart reviewed.  No further reports of emesis.  Vitals are stable.  No hypoxia.  Magnesium  is low.  I will replenish that.  No charge note.  Hospitalist signing off.  Patient is medically stable for discharge.  Discharge per orthopedics discretion.

## 2024-02-25 NOTE — Progress Notes (Signed)
 Physical Therapy Treatment Patient Details Name: RAMESES OU MRN: [id number] DOB: 06-Aug-1955 Today's Date: 02/25/2024   History of Present Illness Pt is 68 yo male admitted on 02/18/24 with L hip wound dehiscence and dislocation. He is s/p closed reduction, debridement, and application of Kerecis graft with incisional wound vac on 7/28.  Pt hospitalization complicated by hypoxia, anemia, vomiting, supratherapeutic INR.  Required 2 units PRBC, hospitalist and GI consult (since signed off). Pt with hx including but not limited to multiple hip dislocations, S/p L closed reduction L hip attempt in OR 6/28, not successful; s/p L open hip reduction 6/30. On 6/18, S/p conversion to L THA with posterior hip precautions given L intertrochanteric femur fx nonunion, recurrent L hip surgery since 2023, HTN, HLD, TIA, CVA with residual LUE n/t.    PT Comments  Pt ready to return to bed.  Tolerated transfer and ambulation well.  Needs frequent cues for hip precautions.  Cont POC.      If plan is discharge home, recommend the following: A lot of help with walking and/or transfers;A lot of help with bathing/dressing/bathroom;Assistance with cooking/housework;Help with stairs or ramp for entrance;Assist for transportation   Can travel by private vehicle     No  Equipment Recommendations  None recommended by PT    Recommendations for Other Services       Precautions / Restrictions Precautions Precautions: Posterior Hip Precaution/Restrictions Comments: wound vac Required Braces or Orthoses: Other Brace Other Brace: abductor brace Restrictions LLE Weight Bearing Per Provider Order: Weight bearing as tolerated Other Position/Activity Restrictions: with brace in place     Mobility  Bed Mobility Overal bed mobility: Needs Assistance Bed Mobility: Supine to Sit, Sit to Supine     Supine to sit: Min assist, +2 for safety/equipment Sit to supine: Min assist, +2 for safety/equipment   General bed  mobility comments: Increased time, cues for sequencing and L posterior hip precautions (particularly cues to not lean forward)    Transfers Overall transfer level: Needs assistance Equipment used: Rolling walker (2 wheels) Transfers: Sit to/from Stand Sit to Stand: Min assist, +2 physical assistance           General transfer comment: Cues for L LE management and hip precautions (not leaning forward).  STS x 2 during session.    Ambulation/Gait Ambulation/Gait assistance: Min assist, +2 safety/equipment Gait Distance (Feet): 5 Feet Assistive device: Rolling walker (2 wheels) Gait Pattern/deviations: Step-to pattern, Decreased step length - right, Decreased step length - left, Shuffle, Wide base of support Gait velocity: decreased     General Gait Details: Cues for RW proximity;   Stairs             Wheelchair Mobility     Tilt Bed    Modified Rankin (Stroke Patients Only)       Balance Overall balance assessment: Needs assistance Sitting-balance support: Feet supported, No upper extremity supported Sitting balance-Leahy Scale: Good     Standing balance support: Bilateral upper extremity supported Standing balance-Leahy Scale: Poor Standing balance comment: requires BUE support                            Communication    Cognition Arousal: Alert Behavior During Therapy: WFL for tasks assessed/performed   PT - Cognitive impairments: No apparent impairments                       PT - Cognition Comments: AxO  x 3 pleasant and following all commands.  Slow to move and easily fatigues.  Progressing.  Pt admits to a Sedentary Lifestyle and Tobacco use.        Cueing    Exercises Total Joint Exercises Ankle Circles/Pumps: AROM, Both, 10 reps, Supine Quad Sets: AROM, Both, 10 reps, Supine   General Comments        Pertinent Vitals/Pain Pain Assessment Pain Assessment: 0-10 Pain Score: 4  Pain Location: L hip Pain Descriptors  / Indicators: Aching, Sore Pain Intervention(s): Limited activity within patient's tolerance, Monitored during session, Premedicated before session, Repositioned    Home Living   Living Arrangements: Other relatives Available Help at Discharge: Family;Available 24 hours/day Type of Home: Mobile home Home Access: Stairs to enter Entrance Stairs-Rails: Doctor, general practice of Steps: 6   Home Layout: One level        Prior Function            PT Goals (current goals can now be found in the care plan section) Progress towards PT goals: Progressing toward goals    Frequency    7X/week      PT Plan      Co-evaluation              AM-PAC PT 6 Clicks Mobility   Outcome Measure  Help needed turning from your back to your side while in a flat bed without using bedrails?: A Lot Help needed moving from lying on your back to sitting on the side of a flat bed without using bedrails?: A Lot (min A but mod cues) Help needed moving to and from a bed to a chair (including a wheelchair)?: A Little Help needed standing up from a chair using your arms (e.g., wheelchair or bedside chair)?: A Lot (min A but mod cues) Help needed to walk in hospital room?: A Lot Help needed climbing 3-5 steps with a railing? : Total 6 Click Score: 12    End of Session Equipment Utilized During Treatment: Gait belt;Other (comment) (hip abd brace) Activity Tolerance: Patient tolerated treatment well Patient left: with call bell/phone within reach;in chair;with SCD's reapplied;with chair alarm set Nurse Communication: Mobility status PT Visit Diagnosis: Difficulty in walking, not elsewhere classified (R26.2)     Time: 8565-8554 PT Time Calculation (min) (ACUTE ONLY): 11 min  Charges:     $Therapeutic Activity: 8-22 mins PT General Charges $$ ACUTE PT VISIT: 1 Visit                     Benjiman, PT Acute Rehab Services [facility] [phone]    Benjiman VEAR Mulberry 02/25/2024, 2:53 PM

## 2024-02-25 NOTE — Progress Notes (Signed)
 Physical Therapy Treatment Patient Details Name: Jonathon Anderson MRN: [id number] DOB: 1956/04/11 Today's Date: 02/25/2024   History of Present Illness Pt is 68 yo male admitted on 02/18/24 with L hip wound dehiscence and dislocation. He is s/p closed reduction, debridement, and application of Kerecis graft with incisional wound vac on 7/28.  Pt hospitalization complicated by hypoxia, anemia, vomiting, supratherapeutic INR.  Required 2 units PRBC, hospitalist and GI consult (since signed off). Pt with hx including but not limited to multiple hip dislocations, S/p L closed reduction L hip attempt in OR 6/28, not successful; s/p L open hip reduction 6/30. On 6/18, S/p conversion to L THA with posterior hip precautions given L intertrochanteric femur fx nonunion, recurrent L hip surgery since 2023, HTN, HLD, TIA, CVA with residual LUE n/t.    PT Comments  Pt continue to make gradual progress.  He reports overall just not feeling well (not able to specify what particular) but agreeable to therapy and VSS.  Pt ambulating 20' x 2 with RW and min A with chair follow.  Needs min A but mod cues for transfers for hip precautions.  Good participation with exercise.  Does still benefit from assist of 2 for safety.  Continue to recommend Patient will benefit from intensive inpatient follow-up therapy, >3 hours/day at d/c.     If plan is discharge home, recommend the following: A lot of help with walking and/or transfers;A lot of help with bathing/dressing/bathroom;Assistance with cooking/housework;Help with stairs or ramp for entrance;Assist for transportation   Can travel by private vehicle     No  Equipment Recommendations  None recommended by PT    Recommendations for Other Services       Precautions / Restrictions Precautions Precautions: Posterior Hip Precaution/Restrictions Comments: wound vac Required Braces or Orthoses: Other Brace Other Brace: abductor brace Restrictions LLE Weight Bearing Per  Provider Order: Weight bearing as tolerated Other Position/Activity Restrictions: with brace in place     Mobility  Bed Mobility Overal bed mobility: Needs Assistance Bed Mobility: Supine to Sit     Supine to sit: Min assist, +2 for safety/equipment     General bed mobility comments: Increased time, cues for sequencing and L posterior hip precautions.    Transfers Overall transfer level: Needs assistance Equipment used: Rolling walker (2 wheels) Transfers: Sit to/from Stand Sit to Stand: Min assist, +2 physical assistance           General transfer comment: Cues for L LE management and hip precautions (not leaning forward).  STS x 4 during session.    Ambulation/Gait Ambulation/Gait assistance: Min assist, +2 safety/equipment Gait Distance (Feet): 20 Feet (20'x2) Assistive device: Rolling walker (2 wheels) Gait Pattern/deviations: Step-to pattern, Decreased step length - right, Decreased step length - left, Shuffle, Wide base of support Gait velocity: decreased     General Gait Details: Cues for RW proximity; chair follow for safety and rest breaks   Stairs             Wheelchair Mobility     Tilt Bed    Modified Rankin (Stroke Patients Only)       Balance Overall balance assessment: Needs assistance Sitting-balance support: Feet supported, No upper extremity supported Sitting balance-Leahy Scale: Fair     Standing balance support: Bilateral upper extremity supported Standing balance-Leahy Scale: Poor Standing balance comment: requires BUE support  Communication    Cognition Arousal: Alert Behavior During Therapy: WFL for tasks assessed/performed   PT - Cognitive impairments: No apparent impairments                       PT - Cognition Comments: AxO x 3 pleasant and following all commands.  Slow to move and easily fatigues.  Progressing.  Pt admits to a Sedentary Lifestyle and Tobacco use.         Cueing    Exercises Total Joint Exercises Ankle Circles/Pumps: AROM, Both, 10 reps, Supine Quad Sets: AROM, Both, 10 reps, Supine Heel Slides: AAROM, Left, Supine, 20 reps Long Arc Quad: AROM, Both, 10 reps, Seated Other Exercises Other Exercises: tricep chair push-ups - not able to clear buttock, more just isometric; x 10    General Comments        Pertinent Vitals/Pain Pain Assessment Pain Assessment: 0-10 Pain Score: 4  Pain Location: L hip Pain Descriptors / Indicators: Aching, Sore Pain Intervention(s): Limited activity within patient's tolerance, Monitored during session, Premedicated before session, Repositioned    Home Living   Living Arrangements: Other relatives Available Help at Discharge: Family;Available 24 hours/day Type of Home: Mobile home Home Access: Stairs to enter Entrance Stairs-Rails: Doctor, general practice of Steps: 6   Home Layout: One level        Prior Function            PT Goals (current goals can now be found in the care plan section) Progress towards PT goals: Progressing toward goals    Frequency    7X/week      PT Plan      Co-evaluation              AM-PAC PT 6 Clicks Mobility   Outcome Measure  Help needed turning from your back to your side while in a flat bed without using bedrails?: A Lot Help needed moving from lying on your back to sitting on the side of a flat bed without using bedrails?: A Lot (min A but mod cues) Help needed moving to and from a bed to a chair (including a wheelchair)?: A Little Help needed standing up from a chair using your arms (e.g., wheelchair or bedside chair)?: A Lot (min A but mod cues) Help needed to walk in hospital room?: A Lot Help needed climbing 3-5 steps with a railing? : Total 6 Click Score: 12    End of Session Equipment Utilized During Treatment: Gait belt;Other (comment) (hip abd brace) Activity Tolerance: Patient tolerated treatment well Patient  left: with call bell/phone within reach;in chair;with SCD's reapplied;with chair alarm set Nurse Communication: Mobility status PT Visit Diagnosis: Difficulty in walking, not elsewhere classified (R26.2)     Time: 8893-8858 PT Time Calculation (min) (ACUTE ONLY): 35 min  Charges:    $Gait Training: 8-22 mins $Therapeutic Exercise: 8-22 mins PT General Charges $$ ACUTE PT VISIT: 1 Visit                     Benjiman, PT Acute Rehab Services [facility] Re[facility] [phone]    Benjiman VEAR Mulberry 02/25/2024, 12:35 PM

## 2024-02-25 NOTE — Progress Notes (Signed)
 Inpatient Rehab Coordinator Note:  I spoke with patient on phone to discuss CIR recommendations and goals/expectations of CIR stay.  We reviewed 3 hrs/day of therapy, physician follow up, and average length of stay 2 weeks (dependent upon progress) with goals of mod I. Patient is interested and reports he lives with his sister, Devere. He would like to discuss with family. Will follow up tomorrow morning.  Rehab Admissons Coordinator Nachum Derossett, [place], [place] [phone]

## 2024-02-25 NOTE — PMR Pre-admission (Signed)
 PMR Admission Coordinator Pre-Admission Assessment  Patient: Jonathon Anderson is an 68 y.o., male MRN: [id number] DOB: 12-20-1955 Height: 5' 7 (170.2 cm) Weight: 98.8 kg  Insurance Information HMO: ***    PPO: ***     PCP: ***     IPA: ***     80/20: ***     OTHER: *** PRIMARY: Medicare A & B      Policy#: [id number]      Subscriber: self CM Name: ***      Phone#: ***     Fax#: *** Pre-Cert#: ***      Employer: *** Benefits:  Phone #: ***     Name: *** Eff. Date: ***     Deduct: $1676       CIR: 100%      SNF: 20 full days Outpatient: 80%     Home Health: 100%      DME: 80%     Providers: *** SECONDARY: ***      Policy#: ***     Phone#: ***  Financial Counselor: ***      Phone#: ***  The "Data Collection Information Summary" for patients in Inpatient Rehabilitation Facilities with attached "Privacy Act Statement-Health Care Records" was provided and verbally reviewed with: Patient  Emergency Contact Information Contact Information     Name Relation Home Work Mobile   [place] Sister [phone]  [phone]      Other Contacts     Name Relation Home Work Mobile   [place] Sister [phone]     Nickola Dames Brother [phone]  [phone]       Current Medical History  Patient Admitting Diagnosis: Hip pain/hip dislocation History of Present Illness: 68 year old male admitted to [facility] 02/18/24 by the orthopedic service for closed reduction irrigation and debridement of left hip arthroplasty now status post wound VAC placement on 7/28 and hospitalist service/TRH was consulted 02/20/2024 due to hypoxia, anemia and vomiting. Patient endorsed some mild abdominal discomfort. He denied any cough or shortness of breath. Hemoglobin fallen to 6.6 , no evidence of any kind of blood loss, he denied any bright red blood in his emesis, or any blood in his stools. He denied any prior history of GI bleeding. Patient with multiple hip dislocations prior.      Patient's medical record from [facility] has been reviewed by the rehabilitation admission coordinator and physician.  Past Medical History  Past Medical History:  Diagnosis Date   Arthritis    Hyperlipidemia    Hypertension    Nicotine dependence    Stroke (HCC)    LIGHT STROKE - NUMBNESS AND TINGLING IN LEFT HAND - 10/2008 APPROX    Has the patient had major surgery during 100 days prior to admission? Yes  Family History   family history includes Heart attack in his mother; Heart failure in his paternal grandfather; Hypertension in his brother.  Current Medications  Current Facility-Administered Medications:    acetaminophen  (TYLENOL ) tablet 325-650 mg, 325-650 mg, Oral, Q6H PRN, Cockerham, Alicia M, PA-C, 650 mg at 02/25/24 0950   amLODipine  (NORVASC ) tablet 10 mg, 10 mg, Oral, Daily, Cockerham, Alicia M, PA-C, 10 mg at 02/25/24 9048   atorvastatin  (LIPITOR) tablet 20 mg, 20 mg, Oral, QHS, Cockerham, Alicia M, PA-C, 20 mg at 02/24/24 2220   calcium  carbonate (TUMS - dosed in mg elemental calcium ) chewable tablet 200 mg of elemental calcium , 1 tablet, Oral, BID WC, Edna Toribio LABOR, MD, 200 mg of elemental calcium  at 02/25/24 [phone]  ceFAZolin  (ANCEF ) IVPB 2g/100 mL premix, 2 g, Intravenous, Q8H, Cockerham, Alicia M, PA-C, Last Rate: 200 mL/hr at 02/25/24 0554, 2 g at 02/25/24 0554   diphenhydrAMINE  (BENADRYL ) 12.5 MG/5ML elixir 12.5-25 mg, 12.5-25 mg, Oral, Q4H PRN, Cockerham, Alicia M, PA-C   docusate sodium  (COLACE) capsule 100 mg, 100 mg, Oral, BID, Cockerham, Alicia M, PA-C, 100 mg at 02/25/24 9049   feeding supplement (ENSURE PLUS HIGH PROTEIN) liquid 237 mL, 237 mL, Oral, BID BM, Edna Toribio LABOR, MD   ferrous sulfate  tablet 325 mg, 325 mg, Oral, Q breakfast, Cockerham, Alicia M, PA-C, 325 mg at 02/25/24 9049   folic acid  (FOLVITE ) tablet 1 mg, 1 mg, Oral, Daily, Cockerham, Alicia M, PA-C, 1 mg at 02/25/24 9049   HYDROmorphone  (DILAUDID ) injection 0.5-1  mg, 0.5-1 mg, Intravenous, Q4H PRN, Cockerham, Alicia M, PA-C, 1 mg at 02/18/24 2000   lactated ringers  infusion, , Intravenous, Continuous, Renae Bernarda HERO, PA-C, Last Rate: 75 mL/hr at 02/24/24 0846, New Bag at 02/24/24 0846   menthol -cetylpyridinium (CEPACOL) lozenge 3 mg, 1 lozenge, Oral, PRN **OR** phenol (CHLORASEPTIC) mouth spray 1 spray, 1 spray, Mouth/Throat, PRN, Cockerham, Alicia M, PA-C   methocarbamol  (ROBAXIN ) tablet 500 mg, 500 mg, Oral, Q6H PRN, 500 mg at 02/23/24 0505 **OR** methocarbamol  (ROBAXIN ) injection 500 mg, 500 mg, Intravenous, Q6H PRN, Renae, Alicia M, PA-C, 500 mg at 02/18/24 1521   metoprolol  tartrate (LOPRESSOR ) tablet 100 mg, 100 mg, Oral, BID, Cockerham, Alicia M, PA-C, 100 mg at 02/25/24 9049   multivitamin with minerals tablet 1 tablet, 1 tablet, Oral, Daily, Marchwiany, Daniel A, MD, 1 tablet at 02/25/24 0950   ondansetron  (ZOFRAN ) tablet 4 mg, 4 mg, Oral, Q6H PRN, 4 mg at 02/22/24 1339 **OR** ondansetron  (ZOFRAN ) injection 4 mg, 4 mg, Intravenous, Q6H PRN, Renae, Alicia M, PA-C, 4 mg at 02/20/24 1256   oxyCODONE  (Oxy IR/ROXICODONE ) immediate release tablet 5-10 mg, 5-10 mg, Oral, Q4H PRN, Renae, Alicia M, PA-C, 5 mg at 02/25/24 1016   pantoprazole  (PROTONIX ) injection 40 mg, 40 mg, Intravenous, Q12H, Kriss Stagger H, DO, 40 mg at 02/25/24 0949   polyethylene glycol (MIRALAX  / GLYCOLAX ) packet 17 g, 17 g, Oral, Daily PRN, Cockerham, Alicia M, PA-C, 17 g at 02/25/24 9050   promethazine  (PHENERGAN ) 12.5 mg in sodium chloride  0.9 % 50 mL IVPB, 12.5 mg, Intravenous, Q6H PRN, Zella, Mir M, MD, Stopped at 02/20/24 1608   senna (SENOKOT) tablet 8.6 mg, 1 tablet, Oral, BID, Cockerham, Alicia M, PA-C, 8.6 mg at 02/25/24 9049   zolpidem  (AMBIEN ) tablet 5 mg, 5 mg, Oral, QHS PRN, Cockerham, Alicia M, PA-C  Patients Current Diet:  Diet Order             Diet regular Room service appropriate? Yes; Fluid consistency: Thin  Diet effective now                    Precautions / Restrictions Precautions Precautions: Posterior Hip Precaution Booklet Issued: Yes (comment) Precaution/Restrictions Comments: wound vac Other Brace: abductor brace Restrictions Weight Bearing Restrictions Per Provider Order: Yes LLE Weight Bearing Per Provider Order: Weight bearing as tolerated Other Position/Activity Restrictions: with brace in place   Has the patient had 2 or more falls or a fall with injury in the past year? No  Prior Activity Level    Prior Functional Level Self Care: Did the patient need help bathing, dressing, using the toilet or eating? Independent  Indoor Mobility: Did the patient need assistance with walking from room to  room (with or without device)? Independent  Stairs: Did the patient need assistance with internal or external stairs (with or without device)? Independent  Functional Cognition: Did the patient need help planning regular tasks such as shopping or remembering to take medications? Independent  Patient Information Are you of Hispanic, Latino/a,or Spanish origin?: A. No, not of Hispanic, Latino/a, or Spanish origin What is your race?: B. Black or African American Do you need or want an interpreter to communicate with a doctor or health care staff?: 0. No  Patient's Response To:  Health Literacy and Transportation Is the patient able to respond to health literacy and transportation needs?: Yes Health Literacy - How often do you need to have someone help you when you read instructions, pamphlets, or other written material from your doctor or pharmacy?: Never In the past 12 months, has lack of transportation kept you from medical appointments or from getting medications?: No In the past 12 months, has lack of transportation kept you from meetings, work, or from getting things needed for daily living?: No  Journalist, newspaper / Equipment Home Equipment: Shower seat, BSC/3in1, Agricultural consultant (2 wheels), [employer] -  single point, Adaptive equipment  Prior Device Use: Indicate devices/aids used by the patient prior to current illness, exacerbation or injury? Walker  Current Functional Level Cognition  Orientation Level: Oriented X4    Extremity Assessment (includes Sensation/Coordination)  Upper Extremity Assessment: Generalized weakness  Lower Extremity Assessment: Defer to PT evaluation    ADLs  Overall ADL's : Needs assistance/impaired Eating/Feeding: Independent, Sitting Grooming: Set up, Sitting Upper Body Bathing: Sitting, Contact guard assist Lower Body Bathing: Maximal assistance, Sit to/from stand Upper Body Dressing : Contact guard assist, Sitting Lower Body Dressing: Maximal assistance, Sit to/from stand Toilet Transfer: Minimal assistance, +2 for physical assistance, +2 for safety/equipment, BSC/3in1, Rolling walker (2 wheels) Toilet Transfer Details (indicate cue type and reason): simulater to chair Toileting- Clothing Manipulation and Hygiene: Maximal assistance, Sit to/from stand Functional mobility during ADLs: Minimal assistance, Rolling walker (2 wheels), Cueing for sequencing, Cueing for safety    Mobility  Overal bed mobility: Needs Assistance Bed Mobility: Sit to Supine Supine to sit: Min assist, Mod assist, +2 for physical assistance, +2 for safety/equipment Sit to supine: Min assist, Mod assist, +2 for physical assistance, +2 for safety/equipment General bed mobility comments: Increased time, use of bed pad to assist. Cues for use of R LE to self assist and adherence to THP    Transfers  Overall transfer level: Needs assistance Equipment used: Rolling walker (2 wheels) Transfers: Sit to/from Stand Sit to Stand: Min assist, Mod assist, +2 physical assistance, +2 safety/equipment, From elevated surface General transfer comment: cues for LE management, use of UEs to self assist and adherence to THP;  Physical assist to bring wt up and fwd and to balance in initial  standing    Ambulation / Gait / Stairs / Wheelchair Mobility  Ambulation/Gait Ambulation/Gait assistance: Min assist, +2 physical assistance, +2 safety/equipment Gait Distance (Feet): 14 Feet Assistive device: Rolling walker (2 wheels) Gait Pattern/deviations: Step-to pattern, Decreased step length - right, Decreased step length - left, Shuffle, Trunk flexed, Wide base of support General Gait Details: cues for sequence, posture and position from RW - distance ltd by fatigue Gait velocity: decreased    Posture / Balance Dynamic Sitting Balance Sitting balance - Comments: often leaning to R side and BUE support Balance Overall balance assessment: Needs assistance Sitting-balance support: Feet supported, Single extremity supported Sitting balance-Leahy Scale: Fair  Sitting balance - Comments: often leaning to R side and BUE support Postural control: Right lateral lean Standing balance support: Bilateral upper extremity supported Standing balance-Leahy Scale: Poor Standing balance comment: requires BUE support    Special considerations/life events  Wound Vac *** and Skin ***   Previous Home Environment (from acute therapy documentation) Living Arrangements: Other relatives  Lives With:  (Sister, Devere) Available Help at Discharge: Family, Available 24 hours/day Type of Home: Mobile home Home Layout: One level Home Access: Stairs to enter Entrance Stairs-Rails: Right, Left Entrance Stairs-Number of Steps: 6 Bathroom Shower/Tub: Armed forces operational officer Accessibility: Yes How Accessible: Accessible via walker Home Care Services: No Additional Comments: planning to discharge to sister's house  Discharge Living Setting Plans for Discharge Living Setting: Patient's home, Lives with (comment) (sister) Type of Home at Discharge: Mobile home Discharge Home Layout: One level Discharge Home Access: Stairs to enter Entrance Stairs-Rails: Right, Left Entrance  Stairs-Number of Steps: 6 Discharge Bathroom Shower/Tub: Tub/shower unit Discharge Bathroom Toilet: Standard Discharge Bathroom Accessibility: Yes How Accessible: Accessible via walker Does the patient have any problems obtaining your medications?: No  Social/Family/Support Systems Anticipated Caregiver: Devere, sister Anticipated Caregiver's Contact Information: [phone] Caregiver Availability: 24/7 Discharge Plan Discussed with Primary Caregiver: Yes Is Caregiver In Agreement with Plan?: Yes Does Caregiver/Family have Issues with Lodging/Transportation while Pt is in Rehab?: No  Goals Patient/Family Goal for Rehab: mod I PT, OT Expected length of stay: 10-12 days Pt/Family Agrees to Admission and willing to participate: Yes Program Orientation Provided & Reviewed with Pt/Caregiver Including Roles  & Responsibilities: Yes  Barriers to Discharge: Insurance for SNF coverage  Decrease burden of Care through IP rehab admission: Othern/a  Possible need for SNF placement upon discharge: not anticipated  Patient Condition: I have reviewed medical records from [facility], spoken with [facility], and patient and family member. I discussed via phone for inpatient rehabilitation assessment.  Patient will benefit from ongoing PT and OT, can actively participate in 3 hours of therapy a day 5 days of the week, and can make measurable gains during the admission.  Patient will also benefit from the coordinated team approach during an Inpatient Acute Rehabilitation admission.  The patient will receive intensive therapy as well as Rehabilitation physician, nursing, social worker, and care management interventions.  Due to safety, skin/wound care, disease management, medication administration, pain management, and patient education the patient requires 24 hour a day rehabilitation nursing.  The patient is currently *** with mobility and basic ADLs.  Discharge setting and therapy post discharge at [facility]  IP discharge location:[id number]} is anticipated.  Patient has agreed to participate in the Acute Inpatient Rehabilitation Program and will admit {Time; today/tomorrow:10263}.  Preadmission Screen Completed By:  COSMO VEAR PLATTER, 02/25/2024 12:20 PM ______________________________________________________________________   Discussed status with Dr. PIERRETTE on *** at *** and received approval for admission today.  Admission Coordinator:  COSMO VEAR PLATTER, time ***/Date ***   Assessment/Plan: Diagnosis: *** Does the need for close, 24 hr/day Medical supervision in concert with the patient's rehab needs make it unreasonable for this patient to be served in a less intensive setting? {yes_no_potentially:3041433} Co-Morbidities requiring supervision/potential complications: *** Due to {due un:6958565}, does the patient require 24 hr/day rehab nursing? {yes_no_potentially:3041433} Does the patient require coordinated care of a physician, rehab nurse, PT, OT, and SLP to address physical and functional deficits in the context of the above medical diagnosis(es)? {yes_no_potentially:3041433} Addressing deficits in the following areas: {deficits:3041436} Can the patient actively participate  in an intensive therapy program of at least 3 hrs of therapy 5 days a week? {yes_no_potentially:3041433} The potential for patient to make measurable gains while on inpatient rehab is {potential:3041437} Anticipated functional outcomes upon discharge from inpatient rehab: {functional outcomes:304600100} PT, {functional outcomes:304600100} OT, {functional outcomes:304600100} SLP Estimated rehab length of stay to reach the above functional goals is: *** Anticipated discharge destination: {anticipated dc setting:21604} 10. Overall Rehab/Functional Prognosis: {potential:3041437}   MD Signature: ***

## 2024-02-26 LAB — CBC
HCT: 23.9 % — ABNORMAL LOW (ref 39.0–52.0)
Hemoglobin: 7.5 g/dL — ABNORMAL LOW (ref 13.0–17.0)
MCH: 28 pg (ref 26.0–34.0)
MCHC: 31.4 g/dL (ref 30.0–36.0)
MCV: 89.2 fL (ref 80.0–100.0)
Platelets: 395 K/uL (ref 150–400)
RBC: 2.68 MIL/uL — ABNORMAL LOW (ref 4.22–5.81)
RDW: 15.9 % — ABNORMAL HIGH (ref 11.5–15.5)
WBC: 9.3 K/uL (ref 4.0–10.5)
nRBC: 0 % (ref 0.0–0.2)

## 2024-02-26 LAB — BASIC METABOLIC PANEL WITH GFR
Anion gap: 8 (ref 5–15)
BUN: <5 mg/dL — ABNORMAL LOW (ref 8–23)
CO2: 29 mmol/L (ref 22–32)
Calcium: 7.5 mg/dL — ABNORMAL LOW (ref 8.9–10.3)
Chloride: 94 mmol/L — ABNORMAL LOW (ref 98–111)
Creatinine, Ser: 0.54 mg/dL — ABNORMAL LOW (ref 0.61–1.24)
GFR, Estimated: >60 mL/min (ref >60)
Glucose, Bld: 106 mg/dL — ABNORMAL HIGH (ref 70–99)
Potassium: 3.8 mmol/L (ref 3.5–5.1)
Sodium: 131 mmol/L — ABNORMAL LOW (ref 135–145)

## 2024-02-26 LAB — PHOSPHORUS: Phosphorus: 3 mg/dL (ref 2.5–4.6)

## 2024-02-26 LAB — MAGNESIUM: Magnesium: 1.8 mg/dL (ref 1.7–2.4)

## 2024-02-26 NOTE — Plan of Care (Signed)
   Problem: Health Behavior/Discharge Planning: Goal: Ability to manage health-related needs will improve Outcome: Progressing   Problem: Clinical Measurements: Goal: Ability to maintain clinical measurements within normal limits will improve Outcome: Progressing Goal: Will remain free from infection Outcome: Progressing

## 2024-02-26 NOTE — Progress Notes (Signed)
   Inpatient Rehabilitation Admissions Coordinator   I spoke with patient and then contacted his brother, Wadie, per his request. Patient and family are in agreement to admit to [facility] CIR. I will make arrangements to admit Wednesday. Acute team and TOC made aware.  Jabier Leavell, RN, MSN Rehab Admissions Coordinator [phone] 02/26/2024 2:19 PM

## 2024-02-26 NOTE — Progress Notes (Signed)
 Physical Therapy Treatment Patient Details Name: Jonathon Anderson MRN: [id number] DOB: [birth year] Today's Date: 02/26/2024   History of Present Illness Pt is 68 yo male admitted on 02/18/24 with L hip wound dehiscence and dislocation. He is s/p closed reduction, debridement, and application of Kerecis graft with incisional wound vac on 7/28.  Pt hospitalization complicated by hypoxia, anemia, vomiting, supratherapeutic INR.  Required 2 units PRBC, hospitalist and GI consult (since signed off). Pt with hx including but not limited to multiple hip dislocations, S/p L closed reduction L hip attempt in OR 6/28, not successful; s/p L open hip reduction 6/30. On 6/18, S/p conversion to L THA with posterior hip precautions given L intertrochanteric femur fx nonunion, recurrent L hip surgery since 2023, HTN, HLD, TIA, CVA with residual LUE n/t.    PT Comments  Pt continue to make gradual progress.  He does fatigue easily and needs assist for transfers.  Pt recalling precautions but needs cues to follow during session.  Continue POC.  Patient will benefit from intensive inpatient follow-up therapy, >3 hours/day at d/c.     If plan is discharge home, recommend the following: A lot of help with walking and/or transfers;A lot of help with bathing/dressing/bathroom;Assistance with cooking/housework;Help with stairs or ramp for entrance;Assist for transportation   Can travel by private vehicle     No  Equipment Recommendations  None recommended by PT    Recommendations for Other Services       Precautions / Restrictions Precautions Precautions: Posterior Hip Precaution Booklet Issued: Yes (comment) Required Braces or Orthoses: Other Brace Other Brace: abductor brace Restrictions LLE Weight Bearing Per Provider Order: Weight bearing as tolerated Other Position/Activity Restrictions: with brace in place     Mobility  Bed Mobility Overal bed mobility: Needs Assistance Bed Mobility: Sit to Supine        Sit to supine: Min assist   General bed mobility comments: Increased time, cues for sequencing and L posterior hip precautions    Transfers Overall transfer level: Needs assistance Equipment used: Rolling walker (2 wheels) Transfers: Sit to/from Stand Sit to Stand: Min assist           General transfer comment: Cues for L LE management and hip precautions (not leaning forward).  STS x 3 during session.    Ambulation/Gait Ambulation/Gait assistance: Min assist Gait Distance (Feet): 35 Feet (20' then 35') Assistive device: Rolling walker (2 wheels) Gait Pattern/deviations: Step-to pattern, Decreased step length - right, Decreased step length - left, Shuffle, Wide base of support Gait velocity: decreased     General Gait Details: Cues for RW proximity;   Stairs             Wheelchair Mobility     Tilt Bed    Modified Rankin (Stroke Patients Only)       Balance Overall balance assessment: Needs assistance Sitting-balance support: Feet supported, No upper extremity supported Sitting balance-Leahy Scale: Good     Standing balance support: Bilateral upper extremity supported Standing balance-Leahy Scale: Poor Standing balance comment: requires BUE support                            Communication    Cognition Arousal: Alert Behavior During Therapy: WFL for tasks assessed/performed   PT - Cognitive impairments: No apparent impairments                       PT - Cognition Comments:  Recalled 3/3 hip precautions but needs min cues during session        Cueing    Exercises Total Joint Exercises Ankle Circles/Pumps: AROM, Both, 10 reps, Supine Quad Sets: AROM, Both, 10 reps, Supine Hip ABduction/ADduction: AAROM, Left, Supine, 10 reps Long Arc Quad: AROM, Both, Seated, 20 reps    General Comments        Pertinent Vitals/Pain Pain Assessment Pain Assessment: 0-10 Pain Score: 4  Pain Location: L hip Pain Descriptors /  Indicators: Aching, Sore Pain Intervention(s): Limited activity within patient's tolerance, Monitored during session, Premedicated before session, Repositioned    Home Living                          Prior Function            PT Goals (current goals can now be found in the care plan section) Progress towards PT goals: Progressing toward goals    Frequency    7X/week      PT Plan      Co-evaluation              AM-PAC PT 6 Clicks Mobility   Outcome Measure  Help needed turning from your back to your side while in a flat bed without using bedrails?: A Little Help needed moving from lying on your back to sitting on the side of a flat bed without using bedrails?: A Lot (min A but mod cues) Help needed moving to and from a bed to a chair (including a wheelchair)?: A Little Help needed standing up from a chair using your arms (e.g., wheelchair or bedside chair)?: A Lot (min A but mod cues) Help needed to walk in hospital room?: A Lot Help needed climbing 3-5 steps with a railing? : Total 6 Click Score: 13    End of Session Equipment Utilized During Treatment: Gait belt Activity Tolerance: Patient tolerated treatment well Patient left: with call bell/phone within reach;with SCD's reapplied;in bed;with bed alarm set;Other (comment) (Donned hip abd brace in chair and was in place for session and hip abd pillow in place at end of session with brace removed) Nurse Communication: Mobility status PT Visit Diagnosis: Difficulty in walking, not elsewhere classified (R26.2);Muscle weakness (generalized) (M62.81)     Time: 8579-8548 PT Time Calculation (min) (ACUTE ONLY): 31 min  Charges:    $Gait Training: 8-22 mins $Therapeutic Exercise: 8-22 mins PT General Charges $$ ACUTE PT VISIT: 1 Visit                     Benjiman, PT Acute Rehab Services [facility] [phone]    Benjiman VEAR Mulberry 02/26/2024, 3:06 PM

## 2024-02-26 NOTE — Progress Notes (Signed)
    8 Days Post-Op Procedure(s) (LRB): IRRIGATION AND DEBRIDEMENT WOUND (Left) APPLICATION, WOUND VAC (Left) CLOSED REDUCTION, HIP  Subjective: Patient doing well this morning. Walked 65ft + 2ft yesterday with PT.  Also changed dressing yesterday.  INR has improved to 1.1 and appears Hgb stable.  Diet advanced this morning, GI signed off over the weekend, see separate note from nutrition.  No BM since last week Wednesday.  Encouraged more fluids and nutrition.  Hopeful for discharge soon.  SNF recently recommended however pt wishes to go home with HHPT.   Objective:   VITALS:   Vitals:   02/25/24 0710 02/25/24 1343 02/25/24 2214 02/26/24 0438  BP: 120/66 104/64 116/65 (!) 115/98  Pulse: 80 69 82 76  Resp: 16 17 17 17   Temp: 98 F (36.7 C) 97.7 F (36.5 C) 99.4 F (37.4 C) 98.1 F (36.7 C)  TempSrc:  Oral  Oral  SpO2: 99% 98% 97% 100%  Weight: 98.8 kg     Height:        AAOx4, resting in NAD Sensation intact distally Intact pulses distally Dorsiflexion/Plantar flexion intact Incision: dressing C/D/I Compartment soft Wound VAC holding suction 120 mmHg and no output in canister Left thigh tender to palpation, no erythema or swelling or bruising   Lab Results  Component Value Date   WBC 11.9 (H) 02/25/2024   HGB 8.7 (L) 02/25/2024   HCT 28.5 (L) 02/25/2024   MCV 89.9 02/25/2024   PLT 430 (H) 02/25/2024   BMET    Component Value Date/Time   NA 131 (L) 02/26/2024 0328   K 3.8 02/26/2024 0328   CL 94 (L) 02/26/2024 0328   CO2 29 02/26/2024 0328   GLUCOSE 106 (H) 02/26/2024 0328   BUN <5 (L) 02/26/2024 0328   CREATININE 0.54 (L) 02/26/2024 0328   CALCIUM  7.5 (L) 02/26/2024 0328   GFRNONAA >60 02/26/2024 0328     Xray: Reduced proximal femoral replacement arthroplasty in good alignment without adverse features  Assessment/Plan: 8 Days Post-Op   Principal Problem:   Recurrent dislocation of left hip  Status post closed reduction left hip dislocation and  irrigation debridement superficial wound dehiscence 02/18/24  Post op recs: WB: WBAT LLE with strict posterior hip precautions Abx: ancef  x23 hours post op, dc on bactrim  Imaging: PACU xrays Dressing: Prevena incisional wound VAC DVT prophylaxis: Aspirin  81 mg Follow up: 1 week after surgery for a wound check with Dr. Edna at [facility]  Address: [address], [place], [place] [address]  Office Phone: [phone]    Bernarda CHRISTELLA Mclean 02/26/2024, 5:53 AM    Contact information:   Weekdays 7am-5pm epic message Dr. Edna, or call office for patient follow up: [phone] After hours and holidays please check [email or web address] for group call information for [facility]

## 2024-02-26 NOTE — Plan of Care (Signed)
  Problem: Health Behavior/Discharge Planning: Goal: Ability to manage health-related needs will improve Outcome: Progressing   Problem: Clinical Measurements: Goal: Ability to maintain clinical measurements within normal limits will improve Outcome: Progressing Goal: Will remain free from infection Outcome: Progressing Goal: Diagnostic test results will improve Outcome: Progressing Goal: Respiratory complications will improve Outcome: Progressing Goal: Cardiovascular complication will be avoided Outcome: Progressing   Problem: Activity: Goal: Risk for activity intolerance will decrease Outcome: Progressing   Problem: Nutrition: Goal: Adequate nutrition will be maintained Outcome: Progressing   Problem: Coping: Goal: Level of anxiety will decrease Outcome: Progressing   Problem: Elimination: Goal: Will not experience complications related to bowel motility Outcome: Progressing   Problem: Pain Managment: Goal: General experience of comfort will improve and/or be controlled Outcome: Progressing   Problem: Safety: Goal: Ability to remain free from injury will improve Outcome: Progressing   Problem: Skin Integrity: Goal: Risk for impaired skin integrity will decrease Outcome: Progressing   Problem: Education: Goal: Knowledge of the prescribed therapeutic regimen will improve Outcome: Progressing Goal: Understanding of discharge needs will improve Outcome: Progressing   Problem: Activity: Goal: Ability to avoid complications of mobility impairment will improve Outcome: Progressing Goal: Ability to tolerate increased activity will improve Outcome: Progressing   Problem: Clinical Measurements: Goal: Postoperative complications will be avoided or minimized Outcome: Progressing   Problem: Pain Management: Goal: Pain level will decrease with appropriate interventions Outcome: Progressing   Problem: Skin Integrity: Goal: Will show signs of wound healing Outcome:  Progressing

## 2024-02-26 NOTE — Progress Notes (Signed)
  Inpatient Rehabilitation Admissions Coordinator   I spoke with patient by phone. He states he and his two sisters and brother have not yet decided on a rehab venue. Sister, Elveria, is not feeling well and he is to follow up with his brother, Wadie , at 12 noon. I will follow up 12 noon.  Heron Leavell, RN, MSN Rehab Admissions Coordinator [phone] 02/26/2024 10:22 AM

## 2024-02-26 NOTE — Progress Notes (Signed)
 Occupational Therapy Treatment Patient Details Name: Jonathon Anderson MRN: [id number] DOB: 1955-09-18 Today's Date: 02/26/2024   History of present illness Pt is 68 yr old male admitted on 02/18/24 with L hip wound dehiscence and dislocation. He is s/p closed reduction, debridement, and application of Kerecis graft with incisional wound vac on 7/28.  Pt hospitalization complicated by hypoxia, anemia, vomiting, supratherapeutic INR.  Required 2 units PRBC, hospitalist and GI consult (since signed off). Pt with hx including but not limited to multiple hip dislocations, S/p L closed reduction L hip attempt in OR 6/28, not successful; s/p L open hip reduction 6/30. On 6/18, S/p conversion to L THA with posterior hip precautions given L intertrochanteric femur fx nonunion, recurrent L hip surgery since 2023, HTN, HLD, TIA, CVA with residual LUE n/t.   OT comments  The pt was pleasant and motivated to participate in the session. He stated he has dislocated his hip twice before and hopes that it doesn't happen again. He required mod assist for supine to sit, total assist to donn his socks seated EOB, and min assist to step-pivot to the chair using a RW. He reported mild hip soreness and discomfort with activity. He was able to verbalize his hip precautions correctly, however he required cues to not violate one precaution during activity. OT anticipates he would do well with post-acute inpatient services. Continue OT plan of care.       If plan is discharge home, recommend the following:  Help with stairs or ramp for entrance;Assistance with cooking/housework;Assist for transportation;A little help with walking and/or transfers   Equipment Recommendations  Other (comment) (defer to next level of care)    Recommendations for Other Services      Precautions / Restrictions Precautions Precautions: Posterior Hip Precaution Booklet Issued: Yes (comment) Precaution/Restrictions Comments: wound vac Other  Brace: abductor brace Restrictions Weight Bearing Restrictions Per Provider Order: Yes LLE Weight Bearing Per Provider Order: Weight bearing as tolerated       Mobility Bed Mobility Overal bed mobility: Needs Assistance Bed Mobility: Supine to Sit     Supine to sit: Mod assist, HOB elevated, Used rails     General bed mobility comments:  (required assist for LLE and for trunk)    Transfers Overall transfer level: Needs assistance Equipment used: Rolling walker (2 wheels) Transfers: Bed to chair/wheelchair/BSC, Sit to/from Stand Sit to Stand: Min assist, From elevated surface          Balance       Sitting balance - Comments: static sitting-good. dynamic sitting-fair+       Standing balance comment: Min assist with RW             ADL either performed or assessed with clinical judgement   ADL Overall ADL's : Needs assistance/impaired     Grooming: Set up;Sitting Grooming Details (indicate cue type and reason): at chair level             Lower Body Dressing: Total assistance;Sitting/lateral leans Lower Body Dressing Details (indicate cue type and reason): to donn socks seated EOB                      Cognition Arousal: Alert    Following commands: Intact                      Pertinent Vitals/ Pain       Pain Assessment Pain Assessment: 0-10 Pain Score: 3  Pain Location: L  hip Pain Descriptors / Indicators: Aching, Sore Pain Intervention(s): Limited activity within patient's tolerance, Monitored during session, Repositioned   Frequency  Min 2X/week        Progress Toward Goals  OT Goals(current goals can now be found in the care plan section)  Progress towards OT goals: Progressing toward goals  Acute Rehab OT Goals Patient Stated Goal: to get better Time For Goal Achievement: 03/01/24 Potential to Achieve Goals: Good ADL Goals Pt Will Perform Lower Body Dressing: with supervision;with adaptive  equipment;sitting/lateral leans;sit to/from stand Pt Will Transfer to Toilet: with supervision;ambulating Pt Will Perform Toileting - Clothing Manipulation and hygiene: with supervision;sit to/from stand Additional ADL Goal #1: The pt will perform bed mobility with supervision, in prep for progressive ADL participation.  Plan      AM-PAC OT 6 Clicks Daily Activity     Outcome Measure   Help from another person eating meals?: None Help from another person taking care of personal grooming?: A Little Help from another person toileting, which includes using toliet, bedpan, or urinal?: A Lot Help from another person bathing (including washing, rinsing, drying)?: A Lot Help from another person to put on and taking off regular upper body clothing?: A Little Help from another person to put on and taking off regular lower body clothing?: A Lot 6 Click Score: 16    End of Session Equipment Utilized During Treatment: Gait belt;Rolling walker (2 wheels)  OT Visit Diagnosis: Muscle weakness (generalized) (M62.81);Pain;Other abnormalities of gait and mobility (R26.89);Unsteadiness on feet (R26.81) Pain - Right/Left: Left Pain - part of body: Hip   Activity Tolerance Patient tolerated treatment well   Patient Left in chair;with call bell/phone within reach;with chair alarm set   Nurse Communication Mobility status        Time: 1131-1200 OT Time Calculation (min): 29 min  Charges: OT General Charges $OT Visit: 1 Visit OT Treatments $Therapeutic Activity: 23-37 mins     Delanna LITTIE Molt, OTR/L 02/26/2024, 4:05 PM

## 2024-02-27 ENCOUNTER — Inpatient Hospital Stay (HOSPITAL_COMMUNITY)

## 2024-02-27 ENCOUNTER — Inpatient Hospital Stay (HOSPITAL_COMMUNITY)
Admission: AD | Admit: 2024-02-27 | Discharge: 2024-03-08 | DRG: 560 | Disposition: A | Discharge status: Home Health | Source: Intra-hospital | Attending: Physical Medicine and Rehabilitation | Admitting: Physical Medicine and Rehabilitation

## 2024-02-27 ENCOUNTER — Encounter (HOSPITAL_COMMUNITY): Payer: Self-pay | Admitting: Physical Medicine and Rehabilitation

## 2024-02-27 ENCOUNTER — Other Ambulatory Visit: Payer: Self-pay

## 2024-02-27 DIAGNOSIS — Z96642 Presence of left artificial hip joint: Secondary | ICD-10-CM | POA: Diagnosis present

## 2024-02-27 DIAGNOSIS — Z79899 Other long term (current) drug therapy: Secondary | ICD-10-CM | POA: Diagnosis not present

## 2024-02-27 DIAGNOSIS — Z87891 Personal history of nicotine dependence: Secondary | ICD-10-CM

## 2024-02-27 DIAGNOSIS — S73005D Unspecified dislocation of left hip, subsequent encounter: Secondary | ICD-10-CM | POA: Diagnosis not present

## 2024-02-27 DIAGNOSIS — I10 Essential (primary) hypertension: Secondary | ICD-10-CM | POA: Diagnosis not present

## 2024-02-27 DIAGNOSIS — S7222XA Displaced subtrochanteric fracture of left femur, initial encounter for closed fracture: Secondary | ICD-10-CM | POA: Diagnosis present

## 2024-02-27 DIAGNOSIS — M1990 Unspecified osteoarthritis, unspecified site: Secondary | ICD-10-CM | POA: Diagnosis present

## 2024-02-27 DIAGNOSIS — E782 Mixed hyperlipidemia: Secondary | ICD-10-CM | POA: Diagnosis present

## 2024-02-27 DIAGNOSIS — R5381 Other malaise: Secondary | ICD-10-CM | POA: Diagnosis present

## 2024-02-27 DIAGNOSIS — Z8673 Personal history of transient ischemic attack (TIA), and cerebral infarction without residual deficits: Secondary | ICD-10-CM | POA: Diagnosis not present

## 2024-02-27 DIAGNOSIS — D649 Anemia, unspecified: Secondary | ICD-10-CM | POA: Diagnosis present

## 2024-02-27 DIAGNOSIS — M623 Immobility syndrome (paraplegic): Secondary | ICD-10-CM | POA: Diagnosis not present

## 2024-02-27 DIAGNOSIS — M79605 Pain in left leg: Secondary | ICD-10-CM | POA: Diagnosis present

## 2024-02-27 DIAGNOSIS — M24452 Recurrent dislocation, left hip: Secondary | ICD-10-CM | POA: Diagnosis present

## 2024-02-27 DIAGNOSIS — M62838 Other muscle spasm: Secondary | ICD-10-CM | POA: Diagnosis present

## 2024-02-27 DIAGNOSIS — M19012 Primary osteoarthritis, left shoulder: Secondary | ICD-10-CM | POA: Diagnosis present

## 2024-02-27 DIAGNOSIS — R29898 Other symptoms and signs involving the musculoskeletal system: Secondary | ICD-10-CM

## 2024-02-27 DIAGNOSIS — E871 Hypo-osmolality and hyponatremia: Secondary | ICD-10-CM | POA: Diagnosis present

## 2024-02-27 DIAGNOSIS — Y838 Other surgical procedures as the cause of abnormal reaction of the patient, or of later complication, without mention of misadventure at the time of the procedure: Secondary | ICD-10-CM | POA: Diagnosis present

## 2024-02-27 DIAGNOSIS — T84021D Dislocation of internal left hip prosthesis, subsequent encounter: Secondary | ICD-10-CM

## 2024-02-27 DIAGNOSIS — K5901 Slow transit constipation: Secondary | ICD-10-CM | POA: Diagnosis not present

## 2024-02-27 DIAGNOSIS — Z8249 Family history of ischemic heart disease and other diseases of the circulatory system: Secondary | ICD-10-CM

## 2024-02-27 DIAGNOSIS — K7689 Other specified diseases of liver: Secondary | ICD-10-CM | POA: Diagnosis present

## 2024-02-27 DIAGNOSIS — R110 Nausea: Secondary | ICD-10-CM

## 2024-02-27 DIAGNOSIS — Z7982 Long term (current) use of aspirin: Secondary | ICD-10-CM | POA: Diagnosis not present

## 2024-02-27 DIAGNOSIS — S7223XD Displaced subtrochanteric fracture of unspecified femur, subsequent encounter for closed fracture with routine healing: Principal | ICD-10-CM

## 2024-02-27 DIAGNOSIS — R112 Nausea with vomiting, unspecified: Secondary | ICD-10-CM | POA: Diagnosis present

## 2024-02-27 DIAGNOSIS — K5900 Constipation, unspecified: Secondary | ICD-10-CM | POA: Diagnosis present

## 2024-02-27 LAB — MAGNESIUM: Magnesium: 1.8 mg/dL (ref 1.7–2.4)

## 2024-02-27 LAB — CBC
HCT: 28.6 % — ABNORMAL LOW (ref 39.0–52.0)
Hemoglobin: 8.8 g/dL — ABNORMAL LOW (ref 13.0–17.0)
MCH: 28 pg (ref 26.0–34.0)
MCHC: 30.8 g/dL (ref 30.0–36.0)
MCV: 91.1 fL (ref 80.0–100.0)
Platelets: 408 K/uL — ABNORMAL HIGH (ref 150–400)
RBC: 3.14 MIL/uL — ABNORMAL LOW (ref 4.22–5.81)
RDW: 16.2 % — ABNORMAL HIGH (ref 11.5–15.5)
WBC: 8.6 K/uL (ref 4.0–10.5)
nRBC: 0 % (ref 0.0–0.2)

## 2024-02-27 LAB — BASIC METABOLIC PANEL WITH GFR
Anion gap: 11 (ref 5–15)
BUN: <5 mg/dL — ABNORMAL LOW (ref 8–23)
CO2: 25 mmol/L (ref 22–32)
Calcium: 8 mg/dL — ABNORMAL LOW (ref 8.9–10.3)
Chloride: 97 mmol/L — ABNORMAL LOW (ref 98–111)
Creatinine, Ser: 0.45 mg/dL — ABNORMAL LOW (ref 0.61–1.24)
GFR, Estimated: >60 mL/min (ref >60)
Glucose, Bld: 94 mg/dL (ref 70–99)
Potassium: 3.8 mmol/L (ref 3.5–5.1)
Sodium: 133 mmol/L — ABNORMAL LOW (ref 135–145)

## 2024-02-27 LAB — PHOSPHORUS: Phosphorus: 3.3 mg/dL (ref 2.5–4.6)

## 2024-02-27 LAB — PROTIME-INR
INR: 1.2 (ref 0.8–1.2)
Prothrombin Time: 16 s — ABNORMAL HIGH (ref 11.4–15.2)

## 2024-02-27 MED ORDER — BISACODYL 10 MG RE SUPP: 10.0000 mg | Freq: Every day | RECTAL | Status: DC | PRN

## 2024-02-27 MED ORDER — FERROUS SULFATE 325 (65 FE) MG PO TABS
325.0000 mg | ORAL_TABLET | Freq: Every day | ORAL | Status: DC
  Administered 2024-02-28 – 2024-03-08 (×13): 325 mg via ORAL
  Filled 2024-02-27 (×10): qty 1

## 2024-02-27 MED ORDER — POLYETHYLENE GLYCOL 3350 17 G PO PACK
17.0000 g | PACK | Freq: Every day | ORAL | Status: DC
  Filled 2024-02-27: qty 1

## 2024-02-27 MED ORDER — MENTHOL 3 MG MT LOZG: 1.0000 | LOZENGE | OROMUCOSAL | Status: DC | PRN

## 2024-02-27 MED ORDER — METOPROLOL TARTRATE 50 MG PO TABS
100.0000 mg | ORAL_TABLET | Freq: Two times a day (BID) | ORAL | Status: DC
  Administered 2024-02-27 – 2024-03-08 (×26): 100 mg via ORAL
  Filled 2024-02-27 (×20): qty 2

## 2024-02-27 MED ORDER — ENOXAPARIN SODIUM 40 MG/0.4ML IJ SOSY
40.0000 mg | PREFILLED_SYRINGE | INTRAMUSCULAR | Status: DC
  Administered 2024-02-27 – 2024-03-07 (×13): 40 mg via SUBCUTANEOUS
  Filled 2024-02-27 (×11): qty 0.4

## 2024-02-27 MED ORDER — METHOCARBAMOL 1000 MG/10ML IJ SOLN: 500.0000 mg | Freq: Four times a day (QID) | INTRAMUSCULAR | Status: DC | PRN

## 2024-02-27 MED ORDER — PROCHLORPERAZINE MALEATE 5 MG PO TABS: 5.0000 mg | ORAL_TABLET | Freq: Four times a day (QID) | ORAL | Status: DC | PRN

## 2024-02-27 MED ORDER — PANTOPRAZOLE SODIUM 40 MG IV SOLR
40.0000 mg | Freq: Two times a day (BID) | INTRAVENOUS | Status: DC
  Filled 2024-02-27 (×2): qty 10

## 2024-02-27 MED ORDER — ENSURE PLUS HIGH PROTEIN PO LIQD: 237.0000 mL | Freq: Two times a day (BID) | ORAL | Status: DC

## 2024-02-27 MED ORDER — POLYETHYLENE GLYCOL 3350 17 G PO PACK: 17.0000 g | PACK | Freq: Every day | ORAL | Status: DC

## 2024-02-27 MED ORDER — GUAIFENESIN-DM 100-10 MG/5ML PO SYRP: 5.0000 mL | ORAL_SOLUTION | Freq: Four times a day (QID) | ORAL | Status: DC | PRN

## 2024-02-27 MED ORDER — SENNA 8.6 MG PO TABS
1.0000 | ORAL_TABLET | Freq: Two times a day (BID) | ORAL | Status: DC
  Administered 2024-02-27 – 2024-03-05 (×19): 8.6 mg via ORAL
  Filled 2024-02-27 (×13): qty 1

## 2024-02-27 MED ORDER — PHENOL 1.4 % MT LIQD: 1.0000 | OROMUCOSAL | Status: DC | PRN

## 2024-02-27 MED ORDER — DIPHENHYDRAMINE HCL 12.5 MG/5ML PO ELIX: 12.5000 mg | ORAL_SOLUTION | ORAL | Status: DC | PRN

## 2024-02-27 MED ORDER — METHOCARBAMOL 500 MG PO TABS
500.0000 mg | ORAL_TABLET | Freq: Four times a day (QID) | ORAL | Status: DC | PRN
  Administered 2024-02-27: 500 mg via ORAL
  Filled 2024-02-27: qty 1

## 2024-02-27 MED ORDER — CEFADROXIL 500 MG PO CAPS
500.0000 mg | ORAL_CAPSULE | Freq: Two times a day (BID) | ORAL | Status: AC
  Administered 2024-02-27 – 2024-03-04 (×17): 500 mg via ORAL
  Filled 2024-02-27 (×13): qty 1

## 2024-02-27 MED ORDER — CEFADROXIL 500 MG PO CAPS
500.0000 mg | ORAL_CAPSULE | Freq: Two times a day (BID) | ORAL | Status: DC
  Filled 2024-02-27: qty 1

## 2024-02-27 MED ORDER — POLYETHYLENE GLYCOL 3350 17 G PO PACK
17.0000 g | PACK | Freq: Two times a day (BID) | ORAL | Status: DC
  Administered 2024-02-27 – 2024-03-05 (×11): 17 g via ORAL
  Filled 2024-02-27 (×14): qty 1

## 2024-02-27 MED ORDER — TRAZODONE HCL 50 MG PO TABS: 25.0000 mg | ORAL_TABLET | Freq: Every evening | ORAL | Status: DC | PRN

## 2024-02-27 MED ORDER — PROCHLORPERAZINE EDISYLATE 10 MG/2ML IJ SOLN: 5.0000 mg | Freq: Four times a day (QID) | INTRAMUSCULAR | Status: DC | PRN

## 2024-02-27 MED ORDER — ALUM & MAG HYDROXIDE-SIMETH 200-200-20 MG/5ML PO SUSP: 30.0000 mL | ORAL | Status: DC | PRN

## 2024-02-27 MED ORDER — AMLODIPINE BESYLATE 10 MG PO TABS
10.0000 mg | ORAL_TABLET | Freq: Every day | ORAL | Status: DC
  Administered 2024-02-28 – 2024-03-08 (×13): 10 mg via ORAL
  Filled 2024-02-27 (×10): qty 1

## 2024-02-27 MED ORDER — OXYCODONE HCL 5 MG PO TABS: 5.0000 mg | ORAL_TABLET | ORAL | Status: DC | PRN

## 2024-02-27 MED ORDER — FLEET ENEMA RE ENEM: 1.0000 | ENEMA | Freq: Once | RECTAL | Status: DC | PRN

## 2024-02-27 MED ORDER — ADULT MULTIVITAMIN W/MINERALS CH
1.0000 | ORAL_TABLET | Freq: Every day | ORAL | Status: DC
  Administered 2024-02-28 – 2024-03-08 (×13): 1 via ORAL
  Filled 2024-02-27 (×10): qty 1

## 2024-02-27 MED ORDER — CALCIUM CARBONATE ANTACID 500 MG PO CHEW
1.0000 | CHEWABLE_TABLET | Freq: Two times a day (BID) | ORAL | Status: DC
  Administered 2024-02-27 – 2024-03-08 (×24): 200 mg via ORAL
  Filled 2024-02-27 (×18): qty 1

## 2024-02-27 MED ORDER — ACETAMINOPHEN 325 MG PO TABS
325.0000 mg | ORAL_TABLET | ORAL | Status: DC | PRN
  Administered 2024-02-27 – 2024-03-01 (×6): 650 mg via ORAL
  Filled 2024-02-27 (×8): qty 2

## 2024-02-27 MED ORDER — FOLIC ACID 1 MG PO TABS
1.0000 mg | ORAL_TABLET | Freq: Every day | ORAL | Status: DC
  Administered 2024-02-28 – 2024-03-08 (×13): 1 mg via ORAL
  Filled 2024-02-27 (×10): qty 1

## 2024-02-27 MED ORDER — PROCHLORPERAZINE 25 MG RE SUPP: 12.5000 mg | Freq: Four times a day (QID) | RECTAL | Status: DC | PRN

## 2024-02-27 MED ORDER — ATORVASTATIN CALCIUM 10 MG PO TABS
20.0000 mg | ORAL_TABLET | Freq: Every day | ORAL | Status: DC
  Administered 2024-02-27 – 2024-03-07 (×13): 20 mg via ORAL
  Filled 2024-02-27 (×10): qty 2

## 2024-02-27 NOTE — Progress Notes (Signed)
 Inpatient Rehabilitation Admission Medication Review by a Pharmacist  A complete drug regimen review was completed for this patient to identify any potential clinically significant medication issues.  High Risk Drug Classes Is patient taking? Indication by Medication  Antipsychotic Yes Compazine  prn N/V  Anticoagulant Yes Lovenox  - VTE ppx   Antibiotic Yes Cefadroxil  - wound, stop date in place  Opioid Yes Oxycodone  prn pain  Antiplatelet No   Hypoglycemics/insulin No   Vasoactive Medication Yes Amlodipine , metoprolol  - HTN  Chemotherapy No   Other Yes Atorvastatin  - HLD Pantoprazole  - Reflux  Methocarbamol  prn spasms Trazodone  prn sleep Ferrous sulfate , folic acid , MVI- supplement     Type of Medication Issue Identified Description of Issue Recommendation(s)  Drug Interaction(s) (clinically significant)     Duplicate Therapy     Allergy     No Medication Administration End Date     Incorrect Dose     Additional Drug Therapy Needed     Significant med changes from prior encounter (inform family/care partners about these prior to discharge).    Other       Clinically significant medication issues were identified that warrant physician communication and completion of prescribed/recommended actions by midnight of the next day:  No  Name of provider notified for urgent issues identified:   Provider Method of Notification:     Pharmacist comments: None  Time spent performing this drug regimen review (minutes):  20 minutes  Thank you. Olam Monte, PharmD

## 2024-02-27 NOTE — Progress Notes (Signed)
 Jonathon Albright, MD  Physician Physical Medicine and Rehabilitation   PMR Pre-admission    Signed   Date of Service: 02/25/2024 12:20 PM  Related encounter: ED to Hosp-Admission (Discharged) from 02/18/2024 in [place] [facility]   Signed     Expand All Collapse All  Show:Clear all [x] Written[x] Templated[x] Copied  Added by: [x] Alison Heron MATSU, RN[x] Conetta, Kristyn H[x] Jonathon Albright, MD  [] Hover for details PMR Admission Coordinator Pre-Admission Assessment   Patient: Jonathon Anderson is an 68 y.o., male MRN: [id number] DOB: 12/09/1955 Height: 5' 7 (170.2 cm) Weight: 94.4 kg   Insurance Information HMO:     PPO:      PCP:      IPA:      80/20:      OTHER:  PRIMARY: Medicare A & B      Policy#: [id number]      Subscriber: self Benefits:  Phone #: passport one source     Name: 8/5 Eff. Date: 06/23/2013     Deduct: $1676       CIR: 100%      SNF: 20 full days Outpatient: 80%     Home Health: 100%      DME: 80%     Providers: pt choice  SECONDARY: none   Financial Counselor:       Phone#:    The Engineer, materials Information Summary" for patients in Inpatient Rehabilitation Facilities with attached "Privacy Act Statement-Health Care Records" was provided and verbally reviewed with: Patient   Emergency Contact Information Contact Information       Name Relation Home Work [place] Sister [phone]   [phone]         Other Contacts       Name Relation Home Work Mobile    [place] Sister [phone]        Nickola Dames Brother [phone]   [phone]         Current Medical History  Patient Admitting Diagnosis: Hip pain/hip dislocation   History of Present Illness: 68 yo male with history of HTN, HLD and arthritis. Also history of recent revision left total hip arthroplasty on 01/09/24 with subsequent revision on 01/21/24 following dislocation by hardware removal, proximal femur replacement and soft tissue  advancement. His incisions had continued to demonstrate poor wound healing, with chronic clear golden drainage. Admitted to [facility] on 02/18/24 for closed reduction , irrigation and debridement of left hip arthroplasty and wound Vac placement on 02/18/24.   Postop complications due to hypoxemia, anemia and vomiting on 7/30. Hospitalist consulted.  CT angiogram ruled out PE> Pre op Hgb was 9.5 and fell postop to 5.5 for which he received 2 units of PRBC and 1 unit of FFP, ASA discontinued, FOBT was ordered and GI consulted. GI did not suspect GI Bleed. Began Protonix . GI felt anemia likely secondary to bleeding at the surgical site as seen on the CT scan in the setting of supra therapeutic INR.     As a part of workup for anemia and hypoxia, hospitalist ordered CT abdomen pelvis which showed incidental finding of A 3.7 x 2.2 cm heterogeneously enhancing lesion in the posterior right lobe of the liver is not characterized on this CT. Further characterization with MRI without and with contrast recommended.  MRI abdomen shows multiple liver cysts. A right hepatic lobe lesion with signal characteristics most consistent with hemangioma. Recommend follow-up to ensure stability.  Patient received multiple doses of vitamin K  due to INR around 7.1.  Oncology was  consulted.  They agreed with the management.  INR is now normalized at 1.1 since 02/23/2024.     Patient's medical record from [facility] has been reviewed by the rehabilitation admission coordinator and physician.   Past Medical History      Past Medical History:  Diagnosis Date   Arthritis     Hyperlipidemia     Hypertension     Nicotine dependence     Stroke (HCC)      LIGHT STROKE - NUMBNESS AND TINGLING IN LEFT HAND - 10/2008 APPROX        Has the patient had major surgery during 100 days prior to admission? Yes   Family History   family history includes Heart attack in his mother; Heart failure in his paternal grandfather; Hypertension  in his brother.   Current Medications  Current Medications    Current Facility-Administered Medications:    acetaminophen  (TYLENOL ) tablet 325-650 mg, 325-650 mg, Oral, Q6H PRN, Cockerham, Alicia M, PA-C, 650 mg at 02/27/24 9444   amLODipine  (NORVASC ) tablet 10 mg, 10 mg, Oral, Daily, Cockerham, Alicia M, PA-C, 10 mg at 02/26/24 0830   atorvastatin  (LIPITOR) tablet 20 mg, 20 mg, Oral, QHS, Cockerham, Alicia M, PA-C, 20 mg at 02/26/24 2120   calcium  carbonate (TUMS - dosed in mg elemental calcium ) chewable tablet 200 mg of elemental calcium , 1 tablet, Oral, BID WC, Edna Toribio LABOR, MD, 200 mg of elemental calcium  at 02/26/24 1732   cefadroxil  (DURICEF) capsule 500 mg, 500 mg, Oral, BID, Marchwiany, Toribio LABOR, MD   diphenhydrAMINE  (BENADRYL ) 12.5 MG/5ML elixir 12.5-25 mg, 12.5-25 mg, Oral, Q4H PRN, Cockerham, Alicia M, PA-C   docusate sodium  (COLACE) capsule 100 mg, 100 mg, Oral, BID, Cockerham, Alicia M, PA-C, 100 mg at 02/26/24 2120   feeding supplement (ENSURE PLUS HIGH PROTEIN) liquid 237 mL, 237 mL, Oral, BID BM, Edna Toribio LABOR, MD, 237 mL at 02/25/24 1448   ferrous sulfate  tablet 325 mg, 325 mg, Oral, Q breakfast, Cockerham, Alicia M, PA-C, 325 mg at 02/26/24 0831   folic acid  (FOLVITE ) tablet 1 mg, 1 mg, Oral, Daily, Cockerham, Alicia M, PA-C, 1 mg at 02/26/24 0830   HYDROmorphone  (DILAUDID ) injection 0.5-1 mg, 0.5-1 mg, Intravenous, Q4H PRN, Cockerham, Alicia M, PA-C, 1 mg at 02/18/24 2000   lactated ringers  infusion, , Intravenous, Continuous, Cockerham, Alicia M, PA-C, Stopped at 02/26/24 1021   menthol -cetylpyridinium (CEPACOL) lozenge 3 mg, 1 lozenge, Oral, PRN **OR** phenol (CHLORASEPTIC) mouth spray 1 spray, 1 spray, Mouth/Throat, PRN, Cockerham, Alicia M, PA-C   methocarbamol  (ROBAXIN ) tablet 500 mg, 500 mg, Oral, Q6H PRN, 500 mg at 02/23/24 0505 **OR** methocarbamol  (ROBAXIN ) injection 500 mg, 500 mg, Intravenous, Q6H PRN, Renae, Alicia M, PA-C, 500 mg at 02/18/24  1521   metoprolol  tartrate (LOPRESSOR ) tablet 100 mg, 100 mg, Oral, BID, Cockerham, Alicia M, PA-C, 100 mg at 02/26/24 2120   multivitamin with minerals tablet 1 tablet, 1 tablet, Oral, Daily, Edna Toribio LABOR, MD, 1 tablet at 02/26/24 0830   ondansetron  (ZOFRAN ) tablet 4 mg, 4 mg, Oral, Q6H PRN, 4 mg at 02/22/24 1339 **OR** ondansetron  (ZOFRAN ) injection 4 mg, 4 mg, Intravenous, Q6H PRN, Renae, Alicia M, PA-C, 4 mg at 02/20/24 1256   oxyCODONE  (Oxy IR/ROXICODONE ) immediate release tablet 5-10 mg, 5-10 mg, Oral, Q4H PRN, Cockerham, Alicia M, PA-C, 5 mg at 02/25/24 2139   pantoprazole  (PROTONIX ) injection 40 mg, 40 mg, Intravenous, Q12H, Kriss Estefana DEL, DO, 40 mg at 02/26/24 2120   polyethylene glycol (MIRALAX  /  GLYCOLAX ) packet 17 g, 17 g, Oral, Daily, Marchwiany, Toribio LABOR, MD   promethazine  (PHENERGAN ) 12.5 mg in sodium chloride  0.9 % 50 mL IVPB, 12.5 mg, Intravenous, Q6H PRN, Zella, Mir M, MD, Stopped at 02/20/24 1608   senna (SENOKOT) tablet 8.6 mg, 1 tablet, Oral, BID, Cockerham, Alicia M, PA-C, 8.6 mg at 02/26/24 2120   zolpidem  (AMBIEN ) tablet 5 mg, 5 mg, Oral, QHS PRN, Cockerham, Alicia M, PA-C     Patients Current Diet:  Diet Order                  Diet regular Room service appropriate? Yes; Fluid consistency: Thin  Diet effective now                       Precautions / Restrictions Precautions Precautions: Posterior Hip Precaution Booklet Issued: Yes (comment) Precaution/Restrictions Comments: wound vac Other Brace: abductor brace Restrictions Weight Bearing Restrictions Per Provider Order: No LLE Weight Bearing Per Provider Order: Weight bearing as tolerated Other Position/Activity Restrictions: with brace in place    Has the patient had 2 or more falls or a fall with injury in the past year? No   Prior Activity Level Community (5-7x/wk): Mod I with RW   Prior Functional Level Self Care: Did the patient need help bathing, dressing, using the toilet  or eating? Independent   Indoor Mobility: Did the patient need assistance with walking from room to room (with or without device)? Independent   Stairs: Did the patient need assistance with internal or external stairs (with or without device)? Independent   Functional Cognition: Did the patient need help planning regular tasks such as shopping or remembering to take medications? Independent   Patient Information Are you of Hispanic, Latino/a,or Spanish origin?: A. No, not of Hispanic, Latino/a, or Spanish origin What is your race?: B. Black or African American Do you need or want an interpreter to communicate with a doctor or health care staff?: 0. No   Patient's Response To:  Health Literacy and Transportation Is the patient able to respond to health literacy and transportation needs?: Yes Health Literacy - How often do you need to have someone help you when you read instructions, pamphlets, or other written material from your doctor or pharmacy?: Never In the past 12 months, has lack of transportation kept you from medical appointments or from getting medications?: No In the past 12 months, has lack of transportation kept you from meetings, work, or from getting things needed for daily living?: No   Journalist, newspaper / Equipment Home Equipment: Shower seat, BSC/3in1, Agricultural consultant (2 wheels), [employer] - single point, Adaptive equipment   Prior Device Use: Indicate devices/aids used by the patient prior to current illness, exacerbation or injury? Walker   Current Functional Level Cognition   Orientation Level: Oriented X4    Extremity Assessment (includes Sensation/Coordination)   Upper Extremity Assessment: Generalized weakness  Lower Extremity Assessment: Defer to PT evaluation     ADLs   Overall ADL's : Needs assistance/impaired Eating/Feeding: Independent, Sitting Grooming: Set up, Sitting Grooming Details (indicate cue type and reason): at chair level Upper Body  Bathing: Sitting, Contact guard assist Lower Body Bathing: Maximal assistance, Sit to/from stand Upper Body Dressing : Contact guard assist, Sitting Lower Body Dressing: Total assistance, Sitting/lateral leans Lower Body Dressing Details (indicate cue type and reason): to donn socks seated EOB Toilet Transfer: Minimal assistance, +2 for physical assistance, +2 for safety/equipment, BSC/3in1, Rolling walker (2 wheels) Toilet  Transfer Details (indicate cue type and reason): simulater to chair Toileting- Clothing Manipulation and Hygiene: Maximal assistance, Sit to/from stand Functional mobility during ADLs: Minimal assistance, Rolling walker (2 wheels), Cueing for sequencing, Cueing for safety     Mobility   Overal bed mobility: Needs Assistance Bed Mobility: Supine to Sit Supine to sit: Mod assist, HOB elevated, Used rails Sit to supine: Min assist General bed mobility comments:  (required assist for LLE and for trunk)     Transfers   Overall transfer level: Needs assistance Equipment used: Rolling walker (2 wheels) Transfers: Bed to chair/wheelchair/BSC, Sit to/from Stand Sit to Stand: Min assist, From elevated surface Bed to/from chair/wheelchair/BSC transfer type:: Step pivot General transfer comment: Cues for L LE management and hip precautions (not leaning forward).  STS x 3 during session.     Ambulation / Gait / Stairs / Wheelchair Mobility   Ambulation/Gait Ambulation/Gait assistance: Editor, commissioning (Feet): 35 Feet (20' then 35') Assistive device: Rolling walker (2 wheels) Gait Pattern/deviations: Step-to pattern, Decreased step length - right, Decreased step length - left, Shuffle, Wide base of support General Gait Details: Cues for RW proximity; Gait velocity: decreased     Posture / Balance Dynamic Sitting Balance Sitting balance - Comments: static sitting-good. dynamic sitting-fair+ Balance Overall balance assessment: Needs assistance Sitting-balance  support: Feet supported, No upper extremity supported Sitting balance-Leahy Scale: Good Sitting balance - Comments: static sitting-good. dynamic sitting-fair+ Postural control: Right lateral lean Standing balance support: Bilateral upper extremity supported Standing balance-Leahy Scale: Poor Standing balance comment: Min assist with RW     Special considerations/life events  Wound VAC to surgical site Numerous hip dislocations in the past    Previous Home Environment  Living Arrangements:  (Lives with sister, Devere)  Lives With:  (Sister, Devere) Available Help at Discharge: Family, Available 24 hours/day Type of Home: Mobile home Home Layout: One level Home Access: Stairs to enter Entrance Stairs-Rails: Right, Left Entrance Stairs-Number of Steps: 6 Bathroom Shower/Tub: Armed forces operational officer Accessibility: Yes How Accessible: Accessible via walker Home Care Services: No Additional Comments: planning to discharge to sister's house   Discharge Living Setting Plans for Discharge Living Setting: Patient's home, Lives with (comment) (sister) Type of Home at Discharge: Mobile home Discharge Home Layout: One level Discharge Home Access: Stairs to enter Entrance Stairs-Rails: Right, Left Entrance Stairs-Number of Steps: 6 Discharge Bathroom Shower/Tub: Tub/shower unit Discharge Bathroom Toilet: Standard Discharge Bathroom Accessibility: Yes How Accessible: Accessible via walker Does the patient have any problems obtaining your medications?: No   Social/Family/Support Systems Contact Information: unable to get Devere by phone, call sister, [place]ro[place] or brother, [place] Anticipated Caregiver: [place] Anticipated Caregiver's Contact Information: see contacts Ability/Limitations of Caregiver: no limitations Caregiver Availability: 24/7 Discharge Plan Discussed with Primary Caregiver: Yes Is Caregiver In Agreement with Plan?: Yes Does Caregiver/Family have  Issues with Lodging/Transportation while Pt is in Rehab?: No   Goals Patient/Family Goal for Rehab: mod I PT, OT Expected length of stay: 10-12 days Pt/Family Agrees to Admission and willing to participate: Yes Program Orientation Provided & Reviewed with Pt/Caregiver Including Roles  & Responsibilities: Yes  Barriers to Discharge: Other (comments) (none)   Decrease burden of Care through IP rehab admission: n/a   Possible need for SNF placement upon discharge: not anticipated   Patient Condition: I have reviewed medical records from [facility], spoken with [facility], and patient and family member. I discussed via phone for inpatient rehabilitation assessment.  Patient will benefit  from ongoing PT and OT, can actively participate in 3 hours of therapy a day 5 days of the week, and can make measurable gains during the admission.  Patient will also benefit from the coordinated team approach during an Inpatient Acute Rehabilitation admission.  The patient will receive intensive therapy as well as Rehabilitation physician, nursing, social worker, and care management interventions.  Due to safety, skin/wound care, disease management, medication administration, pain management, and patient education the patient requires 24 hour a day rehabilitation nursing.  The patient is currently min assist with mobility and basic ADLs.  Discharge setting and therapy post discharge at home with home health is anticipated.  Patient has agreed to participate in the Acute Inpatient Rehabilitation Program and will admit today.   Preadmission Screen Completed By:  Alison Heron Lot, RN MSN 02/27/2024 10:09 AM ______________________________________________________________________   Discussed status with Dr. Urbano on 02/27/24 at 1007 and received approval for admission today.   Admission Coordinator:  Alison Heron Lot, RN, time 8992 Date 02/27/24    Assessment/Plan: Diagnosis: hip dislocation Does the  need for close, 24 hr/day Medical supervision in concert with the patient's rehab needs make it unreasonable for this patient to be served in a less intensive setting? Yes Co-Morbidities requiring supervision/potential complications: anemia, liver cysts and hemangioma, HTN, hyponatremia Due to bladder management, bowel management, safety, skin/wound care, disease management, medication administration, pain management, and patient education, does the patient require 24 hr/day rehab nursing? Yes Does the patient require coordinated care of a physician, rehab nurse, PT, OT, and SLP to address physical and functional deficits in the context of the above medical diagnosis(es)? Yes Addressing deficits in the following areas: balance, endurance, locomotion, strength, transferring, bowel/bladder control, bathing, dressing, feeding, grooming, toileting, and psychosocial support Can the patient actively participate in an intensive therapy program of at least 3 hrs of therapy 5 days a week? Yes The potential for patient to make measurable gains while on inpatient rehab is excellent Anticipated functional outcomes upon discharge from inpatient rehab: modified independent PT, modified independent OT, n/a SLP Estimated rehab length of stay to reach the above functional goals is: 10-12 Anticipated discharge destination: Home 10. Overall Rehab/Functional Prognosis: excellent     MD Signature: Murray Jonathon         Revision History

## 2024-02-27 NOTE — Progress Notes (Addendum)
    9 Days Post-Op Procedure(s) (LRB): IRRIGATION AND DEBRIDEMENT WOUND (Left) APPLICATION, WOUND VAC (Left) CLOSED REDUCTION, HIP  Subjective: Patient doing well this morning. Making progress with PT. Up to 35 feet yesterday. Still no BM but is passing gas and denies belly pain. He is also eating better last couple days. Plan for DC to CIR.   Objective:   VITALS:   Vitals:   02/26/24 2110 02/26/24 2120 02/27/24 0500 02/27/24 0519  BP: 112/64 106/60  123/65  Pulse: 81 79  73  Resp: 16 15  15   Temp: 98.6 F (37 C) 98.5 F (36.9 C)  97.9 F (36.6 C)  TempSrc: Oral Oral  Oral  SpO2: 98%   100%  Weight:   94.4 kg   Height:        AAOx4, resting in NAD Sensation intact distally Intact pulses distally Dorsiflexion/Plantar flexion intact Incision: dressing C/D/I Compartment soft Wound VAC holding suction 120 mmHg and no output in canister Left thigh tender to palpation, no erythema or swelling or bruising   Lab Results  Component Value Date   WBC 9.3 02/26/2024   HGB 7.5 (L) 02/26/2024   HCT 23.9 (L) 02/26/2024   MCV 89.2 02/26/2024   PLT 395 02/26/2024   BMET    Component Value Date/Time   NA 133 (L) 02/27/2024 0340   K 3.8 02/27/2024 0340   CL 97 (L) 02/27/2024 0340   CO2 25 02/27/2024 0340   GLUCOSE 94 02/27/2024 0340   BUN <5 (L) 02/27/2024 0340   CREATININE 0.45 (L) 02/27/2024 0340   CALCIUM  8.0 (L) 02/27/2024 0340   GFRNONAA >60 02/27/2024 0340     Xray: Reduced proximal femoral replacement arthroplasty in good alignment without adverse features  Assessment/Plan: 9 Days Post-Op   Principal Problem:   Recurrent dislocation of left hip  Status post closed reduction left hip dislocation and irrigation debridement superficial wound dehiscence 02/18/24  Post op recs: WB: WBAT LLE with strict posterior hip precautions, hip abduction brace when out of bed, pillow in bed. Abx: ancef  post op, transition to bactrim  today given plan for DC to  CIR Imaging: PACU xrays Dressing: Prevena incisional wound VAC changed 02/26/24. Incision benign and healing well. Continue for 7-10 more days. DVT prophylaxis: Aspirin  81 mg Follow up: 1 week after surgery for a wound check with Dr. Edna at [facility].  Address: [address], [place], [place] [address]  Office Phone: [phone]    Kaede Clendenen A Georgene Kopper 02/27/2024, 7:30 AM    Contact information:   Weekdays 7am-5pm epic message Dr. Edna, or call office for patient follow up: [phone] After hours and holidays please check [email or web address] for group call information for [facility]

## 2024-02-27 NOTE — Progress Notes (Signed)
   02/27/24 1600  Spiritual Encounters  Type of Visit Initial  Care provided to: Patient  Referral source Nurse (RN/NT/LPN)  Reason for visit Advance directives  OnCall Visit No  Interventions  Spiritual Care Interventions Made Established relationship of care and support;Compassionate presence;Reflective listening;Encouragement  Intervention Outcomes  Outcomes Awareness of support  Spiritual Care Plan  Spiritual Care Issues Still Outstanding No further spiritual care needs at this time (see row info)   Chaplain responded to spiritual consult.  Patient reported he was not ready to discuss the Advanced Directive and would prefer to wait and speak to his family before proceeding.  Patient aware spiritual support services remain available as the need arises.

## 2024-02-27 NOTE — Discharge Summary (Signed)
 Physician Discharge Summary  Patient ID: Jonathon Anderson MRN: [id number] DOB/AGE: 1955/08/26 68 y.o.  Admit date: 02/18/2024 Discharge date: 02/27/2024  Admission Diagnoses:  Recurrent dislocation of left hip  Discharge Diagnoses:  Principal Problem:   Recurrent dislocation of left hip   Past Medical History:  Diagnosis Date   Arthritis    Hyperlipidemia    Hypertension    Nicotine dependence    Stroke (HCC)    LIGHT STROKE - NUMBNESS AND TINGLING IN LEFT HAND - 10/2008 APPROX    Surgeries: Procedure(s): IRRIGATION AND DEBRIDEMENT WOUND APPLICATION, WOUND VAC CLOSED REDUCTION, HIP on 02/18/2024   Consultants (if any): Treatment Team:  Vernon Ranks, MD  Discharged Condition: Improved  Hospital Course: Jonathon Anderson is an 68 y.o. male who was admitted 02/18/2024 with a diagnosis of Recurrent dislocation of left hip as well as superficial wound dehiscence. He underwent closed reduction and I&D. Post op had concerns including elevated INR of unknown etiology which was felt to contribute to increase hip swelling as we as GI symptoms. These resolved once the INR was corrected with a combnation of FFP and vitamin K . He was restricted with the hip abduction brace and hip abduction pillow and has been relatively weak and deconditioned. He made gradual progress with PT and ultimately deemed stable for discharge to CIR.  He was given perioperative antibiotics:  Anti-infectives (From admission, onward)    Start     Dose/Rate Route Frequency Ordered Stop   02/27/24 0732  cefadroxil  (DURICEF) capsule 500 mg  Status:  Discontinued        500 mg Oral 2 times daily 02/27/24 0732 02/27/24 1128   02/22/24 1400  ceFAZolin  (ANCEF ) IVPB 2g/100 mL premix  Status:  Discontinued       Note to Pharmacy: Likely go home on one week cefadroxil    2 g 200 mL/hr over 30 Minutes Intravenous Every 8 hours 02/22/24 1259 02/27/24 0732   02/19/24 0800  ceFAZolin  (ANCEF ) IVPB 2g/100 mL premix        2  g 200 mL/hr over 30 Minutes Intravenous Every 8 hours 02/19/24 0548 02/21/24 2352   02/18/24 1800  ceFAZolin  (ANCEF ) IVPB 2g/100 mL premix        2 g 200 mL/hr over 30 Minutes Intravenous Every 6 hours 02/18/24 1633 02/18/24 2359   02/18/24 1305  vancomycin  (VANCOCIN ) powder  Status:  Discontinued          As needed 02/18/24 1305 02/18/24 1409   02/18/24 1045  ceFAZolin  (ANCEF ) IVPB 2g/100 mL premix        2 g 200 mL/hr over 30 Minutes Intravenous On call to O.R. 02/18/24 1043 02/18/24 1233   02/18/24 0000  sulfamethoxazole -trimethoprim  (BACTRIM  DS) 800-160 MG tablet  Status:  Discontinued        1 tablet Oral 2 times daily 02/18/24 1014 02/26/24      .  He was given sequential compression devices, early ambulation, and aspirin  for DVT prophylaxis.  He benefited maximally from the hospital stay and there were no complications.    Recent vital signs:  Vitals:   02/26/24 2120 02/27/24 0519  BP: 106/60 123/65  Pulse: 79 73  Resp: 15 15  Temp: 98.5 F (36.9 C) 97.9 F (36.6 C)  SpO2:  100%    Recent laboratory studies:  Lab Results  Component Value Date   HGB 8.8 (L) 02/27/2024   HGB 7.5 (L) 02/26/2024   HGB 8.7 (L) 02/25/2024   Lab Results  Component  Value Date   WBC 8.6 02/27/2024   PLT 408 (H) 02/27/2024   Lab Results  Component Value Date   INR 1.2 02/27/2024   Lab Results  Component Value Date   NA 133 (L) 02/27/2024   K 3.8 02/27/2024   CL 97 (L) 02/27/2024   CO2 25 02/27/2024   BUN <5 (L) 02/27/2024   CREATININE 0.45 (L) 02/27/2024   GLUCOSE 94 02/27/2024    Discharge Medications:   Allergies as of 02/27/2024   No Known Allergies      Medication List     STOP taking these medications    diclofenac  75 MG EC tablet Commonly known as: VOLTAREN    oxyCODONE  5 MG immediate release tablet Commonly known as: Oxy IR/ROXICODONE        TAKE these medications    acetaminophen  500 MG tablet Commonly known as: TYLENOL  Take 1,000 mg by mouth in the  morning and at bedtime.   amLODipine  10 MG tablet Commonly known as: NORVASC  Take 10 mg by mouth in the morning.   aspirin  EC 81 MG tablet Take 1 tablet (81 mg total) by mouth 2 (two) times daily for 28 days. Swallow whole. What changed: Another medication with the same name was removed. Continue taking this medication, and follow the directions you see here.   atorvastatin  20 MG tablet Commonly known as: LIPITOR Take 20 mg by mouth at bedtime.   BIOFREEZE EX Apply 1 Application topically daily as needed (pain).   cefadroxil  500 MG capsule Commonly known as: DURICEF Take 1 capsule (500 mg total) by mouth 2 (two) times daily for 7 days.   celecoxib  100 MG capsule Commonly known as: CeleBREX  Take 1 capsule (100 mg total) by mouth 2 (two) times daily for 14 days.   cyclobenzaprine  10 MG tablet Commonly known as: FLEXERIL  Take 1 tablet (10 mg total) by mouth every 12 (twelve) hours as needed for muscle spasms.   magnesium  hydroxide 400 MG/5ML suspension Commonly known as: MILK OF MAGNESIA Take 15 mLs by mouth daily as needed (constipation.).   metoprolol  tartrate 100 MG tablet Commonly known as: LOPRESSOR  Take 100 mg by mouth 2 (two) times daily.   ondansetron  4 MG tablet Commonly known as: ZOFRAN  Take 1 tablet (4 mg total) by mouth every 6 (six) hours as needed for nausea.   polyethylene glycol 17 g packet Commonly known as: MiraLax  Take 17 g by mouth daily.       ASK your doctor about these medications    oxyCODONE  5 MG immediate release tablet Commonly known as: Roxicodone  Take 1 tablet (5 mg total) by mouth every 4 (four) hours as needed for up to 7 days for severe pain (pain score 7-10) or moderate pain (pain score 4-6). Ask about: Should I take this medication?        Diagnostic Studies: DG Shoulder Left Result Date: 02/27/2024 CLINICAL DATA:  141835 Weakness 141835 EXAM: LEFT SHOULDER - 2+ VIEW COMPARISON:  None Available. FINDINGS: No acute fracture or  dislocation. Moderate joint space loss of the AC joint. Soft tissues are unremarkable. IMPRESSION: 1. No acute fracture or dislocation. 2. 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 Electronically Signed   By: Rogelia Myers M.D.   On: 02/27/2024 19:26   MR ABDOMEN W WO CONTRAST Result Date: 02/21/2024 CLINICAL DATA:  Intra-abdominal neoplasm suspected EXAM: MRI ABDOMEN WITHOUT AND WITH CONTRAST TECHNIQUE: Multiplanar multisequence MR imaging of the abdomen was performed both before and after the administration of intravenous contrast. CONTRAST:  10mL GADAVIST  GADOBUTROL  1 MMOL/ML IV SOLN COMPARISON:  CT abdomen pelvis February 20, 2024 FINDINGS: Lower chest: No acute findings. Hepatobiliary: There is a hepatic dome segment 8 T2 hyperintense nonenhancing cyst measuring 7.9 x 6.9 cm. A punctate satellite cyst is also identified measuring 4 mm next to this cyst. There is a nonenhancing T2 hyperintense small cyst measuring 1 cm in sub subcapsular right hepatic lobe posterior to right hepatic vein (3/22). There is a lobulated lesion measuring 4.6 x 2.6x 4.3 cm in subcapsular posterior aspect of segment 6 with peripheral discontinuous nodular enhancement and gradual filling on delayed phase sequence consistent with hemangioma. These lesions are stable to prior CT. No significant hepatic steatosis. Mild hepatomegaly measuring 18.2 cm in craniocaudal dimension. Gallbladder stones/sludge. Pancreas: No mass, inflammatory changes, or other parenchymal abnormality identified. Mild atrophic changes. Spleen:  Within normal limits in size and appearance. Adrenals/Urinary Tract: Few T2 hyperintense subcentimeter simple cortical cysts in both kidneys. No evidence of hydronephrosis. Normal adrenal glands. Stomach/Bowel: No mass, inflammatory changes, or other parenchymal abnormality identified. Vascular/Lymphatic: No pathologically enlarged lymph nodes identified. No abdominal aortic aneurysm demonstrated. Other: Partial  visualization of T2 hyperintense soft tissue changes around the left hip prosthesis better assessed on recent CT. Musculoskeletal: T2 hyperintense lesion in L4 vertebral body likely hemangioma. IMPRESSION: Mild hepatomegaly with multiple liver cysts. A right hepatic lobe lesion with signal characteristics most consistent with hemangioma. Recommend follow-up to ensure stability. Few simple renal cortical cysts which does not require imaging follow-up. L4 lesion likely representing intraosseous hemangioma. Electronically Signed   By: Megan  Zare M.D.   On: 02/21/2024 14:23   CT Angio Chest Pulmonary Embolism (PE) W or WO Contrast Result Date: 02/20/2024 CLINICAL DATA:  Concern for pulmonary embolism and bowel obstruction. EXAM: CT ANGIOGRAPHY CHEST CT ABDOMEN AND PELVIS WITH CONTRAST TECHNIQUE: Multidetector CT imaging of the chest was performed using the standard protocol during bolus administration of intravenous contrast. Multiplanar CT image reconstructions and MIPs were obtained to evaluate the vascular anatomy. Multidetector CT imaging of the abdomen and pelvis was performed using the standard protocol during bolus administration of intravenous contrast. RADIATION DOSE REDUCTION: This exam was performed according to the departmental dose-optimization program which includes automated exposure control, adjustment of the mA and/or kV according to patient size and/or use of iterative reconstruction technique. CONTRAST:  OMNIPAQUE  IOHEXOL  350 MG/ML SOLN COMPARISON:  None Available. FINDINGS: CTA CHEST FINDINGS Cardiovascular: There is no cardiomegaly or pericardial effusion. The thoracic aorta is unremarkable. The origins of the great vessels of the aortic arch are patent. Evaluation of the pulmonary arteries is limited due to respiratory motion. No central pulmonary artery embolus identified. Mediastinum/Nodes: No hilar or mediastinal adenopathy. The esophagus is grossly unremarkable. No mediastinal fluid  collection. Lungs/Pleura: No focal consolidation, pleural effusion, pneumothorax. There are bibasilar atelectasis. The central airways are patent. Musculoskeletal: Degenerative changes of the spine. No acute osseous pathology. Review of the MIP images confirms the above findings. CT ABDOMEN and PELVIS FINDINGS No intra-abdominal free air or free fluid. Hepatobiliary: There is a 6.8 x 7.3 cm cyst in the dome of the liver. Additional 3.7 x 2.2 cm heterogeneously enhancing lesion in the posterior right lobe of the liver is not characterized on this CT. Further characterization with MRI without and with contrast recommended. No biliary dilatation. There is sludge or small stones in the gallbladder. No pericholecystic fluid or evidence of acute cholecystitis by CT. Pancreas: Unremarkable. No pancreatic ductal dilatation or surrounding inflammatory changes. Spleen: Normal in  size without focal abnormality. Adrenals/Urinary Tract: The adrenal glands are unremarkable. There is no hydronephrosis on either side. There is symmetric enhancement and excretion of contrast by both kidneys. The visualized ureters and urinary bladder appear unremarkable. Stomach/Bowel: There is sigmoid diverticulosis. There is no bowel obstruction or active inflammation. The appendix is normal. Vascular/Lymphatic: Mild aortoiliac atherosclerotic disease. The IVC is unremarkable. No portal venous gas. There is no adenopathy. Reproductive: The prostate and seminal vesicles are grossly remarkable. Other: Small fat containing umbilical hernia. Musculoskeletal: There is a total left hip arthroplasty. A 10 x 11 cm fluid collection with pockets of air and enhancing walls adjacent to the arthroplasty is may be postsurgical but concerning for an infectious process. Clinical correlation is recommended. There is degenerative changes of the spine. No acute osseous pathology. Review of the MIP images confirms the above findings. IMPRESSION: 1. No acute  intrathoracic pathology. No CT evidence of central pulmonary artery embolus. 2. Sigmoid diverticulosis. No bowel obstruction. Normal appendix. 3. A complex periprostatic collection adjacent to the left hip arthroplasty may be postsurgical but concerning for an infectious process. 4. A 3.7 x 2.2 cm heterogeneously enhancing lesion in the posterior right lobe of the liver is not characterized on this CT. Further characterization with MRI without and with contrast recommended. 5. Cholelithiasis. 6.  Aortic Atherosclerosis (ICD10-I70.0). Electronically Signed   By: Vanetta Chou M.D.   On: 02/20/2024 11:29   CT ABDOMEN PELVIS W CONTRAST Result Date: 02/20/2024 CLINICAL DATA:  Concern for pulmonary embolism and bowel obstruction. EXAM: CT ANGIOGRAPHY CHEST CT ABDOMEN AND PELVIS WITH CONTRAST TECHNIQUE: Multidetector CT imaging of the chest was performed using the standard protocol during bolus administration of intravenous contrast. Multiplanar CT image reconstructions and MIPs were obtained to evaluate the vascular anatomy. Multidetector CT imaging of the abdomen and pelvis was performed using the standard protocol during bolus administration of intravenous contrast. RADIATION DOSE REDUCTION: This exam was performed according to the departmental dose-optimization program which includes automated exposure control, adjustment of the mA and/or kV according to patient size and/or use of iterative reconstruction technique. CONTRAST:  OMNIPAQUE  IOHEXOL  350 MG/ML SOLN COMPARISON:  None Available. FINDINGS: CTA CHEST FINDINGS Cardiovascular: There is no cardiomegaly or pericardial effusion. The thoracic aorta is unremarkable. The origins of the great vessels of the aortic arch are patent. Evaluation of the pulmonary arteries is limited due to respiratory motion. No central pulmonary artery embolus identified. Mediastinum/Nodes: No hilar or mediastinal adenopathy. The esophagus is grossly unremarkable. No mediastinal  fluid collection. Lungs/Pleura: No focal consolidation, pleural effusion, pneumothorax. There are bibasilar atelectasis. The central airways are patent. Musculoskeletal: Degenerative changes of the spine. No acute osseous pathology. Review of the MIP images confirms the above findings. CT ABDOMEN and PELVIS FINDINGS No intra-abdominal free air or free fluid. Hepatobiliary: There is a 6.8 x 7.3 cm cyst in the dome of the liver. Additional 3.7 x 2.2 cm heterogeneously enhancing lesion in the posterior right lobe of the liver is not characterized on this CT. Further characterization with MRI without and with contrast recommended. No biliary dilatation. There is sludge or small stones in the gallbladder. No pericholecystic fluid or evidence of acute cholecystitis by CT. Pancreas: Unremarkable. No pancreatic ductal dilatation or surrounding inflammatory changes. Spleen: Normal in size without focal abnormality. Adrenals/Urinary Tract: The adrenal glands are unremarkable. There is no hydronephrosis on either side. There is symmetric enhancement and excretion of contrast by both kidneys. The visualized ureters and urinary bladder appear unremarkable. Stomach/Bowel: There is  sigmoid diverticulosis. There is no bowel obstruction or active inflammation. The appendix is normal. Vascular/Lymphatic: Mild aortoiliac atherosclerotic disease. The IVC is unremarkable. No portal venous gas. There is no adenopathy. Reproductive: The prostate and seminal vesicles are grossly remarkable. Other: Small fat containing umbilical hernia. Musculoskeletal: There is a total left hip arthroplasty. A 10 x 11 cm fluid collection with pockets of air and enhancing walls adjacent to the arthroplasty is may be postsurgical but concerning for an infectious process. Clinical correlation is recommended. There is degenerative changes of the spine. No acute osseous pathology. Review of the MIP images confirms the above findings. IMPRESSION: 1. No acute  intrathoracic pathology. No CT evidence of central pulmonary artery embolus. 2. Sigmoid diverticulosis. No bowel obstruction. Normal appendix. 3. A complex periprostatic collection adjacent to the left hip arthroplasty may be postsurgical but concerning for an infectious process. 4. A 3.7 x 2.2 cm heterogeneously enhancing lesion in the posterior right lobe of the liver is not characterized on this CT. Further characterization with MRI without and with contrast recommended. 5. Cholelithiasis. 6.  Aortic Atherosclerosis (ICD10-I70.0). Electronically Signed   By: Vanetta Chou M.D.   On: 02/20/2024 11:29   DG HIP UNILAT W OR W/O PELVIS 2-3 VIEWS LEFT Result Date: 02/18/2024 CLINICAL DATA:  Postop reduction EXAM: DG HIP (WITH OR WITHOUT PELVIS) 2-3V LEFT COMPARISON:  02/18/2024 FINDINGS: Interval reduction of the previously seen dislocated left hip prosthesis. Normal alignment. No visible acute fracture. IMPRESSION: Interval reduction. Electronically Signed   By: Franky Crease M.D.   On: 02/18/2024 17:39   DG HIP UNILAT WITH PELVIS 2-3 VIEWS LEFT Result Date: 02/18/2024 CLINICAL DATA:  Dislocated left hip prosthesis EXAM: DG HIP (WITH OR WITHOUT PELVIS) 2-3V LEFT COMPARISON:  02/18/2024 FINDINGS: Multiple intraoperative images initially demonstrate dislocated left hip prosthesis. Final images demonstrate reduction. IMPRESSION: Intraoperative imaging for reduction of the dislocated left hip prosthesis. Electronically Signed   By: Franky Crease M.D.   On: 02/18/2024 17:38   DG Pelvis Portable Result Date: 02/18/2024 CLINICAL DATA:  Left hip instability preoperative EXAM: PORTABLE PELVIS 1-2 VIEWS COMPARISON:  January 18, 2024 FINDINGS: Status post left hip total arthroplasty with superolateral dislocation of the left femoral component surgical clips superimposing the right femoral trochanter. No acute osseous fracture. Soft tissues are unremarkable. He she high, Similar to prior radiograph. IMPRESSION:  Superolateral dislocation of left hip arthroplasty femoral component. Electronically Signed   By: Megan  Zare M.D.   On: 02/18/2024 14:53   DG C-Arm 1-60 Min-No Report Result Date: 02/18/2024 Fluoroscopy was utilized by the requesting physician.  No radiographic interpretation.   DG C-Arm 1-60 Min-No Report Result Date: 02/18/2024 Fluoroscopy was utilized by the requesting physician.  No radiographic interpretation.    Disposition:  There are no questions and answers to display.           Follow-up Information     Edna Toribio LABOR, MD Follow up in 1 week(s).   Specialty: Orthopedic Surgery Contact information: [address] [place] [address] [phone]         Kriss Estefana DEL, DO. Schedule an appointment as soon as possible for a visit in 2 month(s).   Specialty: Gastroenterology Why: Follow-up for anemia Contact information: [address] [place] [address] [phone]                    Discharge Instructions      Orthopedic Discharge Instructions  Diet: As you  were doing prior to hospitalization   Shower:  May shower but keep the wounds dry, use an occlusive plastic wrap, NO SOAKING IN TUB.    Dressing: Keep the surgical dressing until follow up.  This has battery powered suction to be kept at 125 mmHg and lasts 7 days.  If the battery stops sooner than 7 days or if the bandage begins to leak, then call our office for further instructions.    The dressing is also water  resistant, however it is best to shower with an extra covering, such as saran wrap to keep it dry.  IF THE DRESSING FALLS OFF or the wound gets wet inside, change the dressing with sterile gauze and call our office for further instruction.  Please use good hand washing techniques before changing the dressing.  Do not use any lotions or creams on the incision until instructed by your surgeon.    Activity:  Increase activity slowly as tolerated,  but follow the weight bearing instructions below.  Continue with total hip precautions for the left leg.  Do not drive for the next 4 weeks.  In addition, you cannot be taking narcotics while you drive, and you must feel in control of the vehicle.    Weight Bearing:   You may bear weight on your surgical leg as tolerated.    Blood clot prevention (DVT Prophylaxis): After surgery you are at an increased risk for a blood clot. you were prescribed a blood thinner, Aspirin  81 mg, to be taken twice daily for a total of 4 weeks from surgery to help reduce your risk of getting a blood clot. Signs of a pulmonary embolus (blood clot in the lungs) include sudden short of breath, feeling lightheaded or dizzy, chest pain with a deep breath, rapid pulse rapid breathing.  Signs of a blood clot in your arms or legs include new unexplained swelling and cramping, warm, red or darkened skin around the painful area.  Please call the office or 911 right away if these signs or symptoms develop.  To prevent constipation: you may use a stool softener such as - Colace (over the counter) 100 mg by mouth twice a day  Drink plenty of fluids (prune juice may be helpful) and high fiber foods Miralax  (over the counter) for constipation as needed.    Itching:  If you experience itching with your medications, try taking only a single pain pill, or even half a pain pill at a time.  You may take up to 10 pain pills per day, and you can also use benadryl  over the counter for itching or also to help with sleep.   Precautions:  If you experience chest pain or shortness of breath - call 911 immediately for transfer to the hospital emergency department!!  Call office [phone]) for the following: Temperature greater than 101F Persistent nausea and vomiting Severe uncontrolled pain Redness, tenderness, or signs of infection (pain, swelling, redness, odor or green/yellow discharge around the site) Difficulty breathing, headache or  visual disturbances Hives Persistent dizziness or light-headedness Extreme fatigue Any other questions or concerns you may have after discharge  In an emergency, call 911 or go to an Emergency Department at a nearby hospital  Follow- Up Appointment:  Please call for an appointment to be seen approximately 2-3 week after surgery in [facility] with your surgeon Dr. Toribio Higashi - [phone] Address: [address], [place], [place] [address]        Signed: TORIBIO LABOR Brylan Dec 02/27/2024,  9:29 PM

## 2024-02-27 NOTE — TOC Transition Note (Signed)
 Transition of Care [facility]) - Discharge Note   Patient Details  Name: Jonathon Anderson MRN: [id number] Date of Birth: 1955/08/02  Transition of Care [facility]) CM/SW Contact:  Tawni CHRISTELLA Eva, LCSW Phone Number: 02/27/2024, 9:36 AM   Clinical Narrative:    Pt to d/c to [facility] via [facility]. No further Care management needs , Care Management sign off.    Final next level of care: IP Rehab Facility Barriers to Discharge: Barriers Resolved   Patient Goals and CMS Choice Patient states their goals for this hospitalization and ongoing recovery are:: Rehab to get stonger          Discharge Placement                    Patient and family notified of of transfer: 02/27/24  Discharge Plan and Services Additional resources added to the After Visit Summary for                  DME Arranged: N/A DME Agency: NA       HH Arranged: PT HH Agency: [facility] Date HH Agency Contacted: 02/19/24   Representative spoke with at [facility] Agency: [place]  Social Drivers of Health (SDOH) Interventions SDOH Screenings   Food Insecurity: No Food Insecurity (02/18/2024)  Housing: Low Risk  (02/18/2024)  Transportation Needs: No Transportation Needs (02/18/2024)  Utilities: Not At Risk (02/18/2024)  Social Connections: Moderately Integrated (02/18/2024)  Tobacco Use: Medium Risk (02/18/2024)     Readmission Risk Interventions    02/19/2024    3:17 PM  Readmission Risk Prevention Plan  Post Dischage Appt Complete  Medication Screening Complete  Transportation Screening Complete

## 2024-02-27 NOTE — H&P (Signed)
 Physical Medicine and Rehabilitation Admission H&P    Chief Complaint  Patient presents with   Functional deficits due to debility    : HPI: Jonathon Anderson is a 68 year old male with PMHx of history of HTN, HLD, and arthritis. He underwent a left total hip arthroplasty revision on 01/09/24 and a subsequent revision on January 21, 2024, following a dislocation due to a hardware removal, proximal femur replacement, and soft tissue advancement. His incisions continued to demonstrate poor wound healing with chronic clear drainage, leading to his admission to [facility] on 7/28 for closed reduction, irrigation, and debridement of the left hip arthroplasty with wound vac placement by Dr. Edna.  Postoperative complications included hypoxemia, anemia, and vomiting. A CT angiogram ruled out a pulmonary embolism. Preoperatively, his hemoglobin was 9.5 and fell to 5.5 postoperatively, for which he received 2 units of packed red blood cells and 1 unit of FFP. Aspirin  was discontinued. GI was consulted and did not suspect a GI bleed and recommended supportive care with Protonix , it was felt that the anemia was likely secondary to bleeding at the surgical site in the setting of a supratherapeutic INR. A CT abdomen pelvis showed an incidental finding of a 3.7 x 2.2 cm heterogeneously enhancing lesion in the posterior right lobe of the liver, with further care through excision with MRI recommended. The MRI of the abdomen showed multiple liver cysts with the right hepatic lobe lesion having signal characteristics most consistent with a hemangioma. The patient received multiple doses of vitamin K  due to an INR of around 7.1. Pt reports L shoulder weakness since his injury. Oncology was consulted and agreed with the management. Prior to admission patient was Mod I with RW and independent with ADLs living in mobile home with 6 steps to enter. He currently requires assistance with ADLs with mod-min assist for  mobility with device. Therapy evaluations completed due to patient decreased functional mobility was admitted for a comprehensive rehab program.     Review of Systems  Constitutional: Negative.   HENT: Negative.    Eyes:  Negative for blurred vision and double vision.  Respiratory: Negative.    Cardiovascular:  Negative for chest pain, palpitations and leg swelling.  Gastrointestinal:  Positive for constipation (LMB 1 week ago). Negative for abdominal pain, diarrhea, nausea and vomiting.  Genitourinary: Negative.   Musculoskeletal:  Positive for back pain and myalgias.       LLE edema  Skin:  Negative for rash.       Prevena present left hip  Neurological:  Negative for tingling, sensory change, focal weakness and headaches.  Endo/Heme/Allergies: Negative.   Psychiatric/Behavioral:  The patient does not have insomnia.    Past Medical History:  Diagnosis Date   Arthritis    Hyperlipidemia    Hypertension    Nicotine dependence    Stroke (HCC)    LIGHT STROKE - NUMBNESS AND TINGLING IN LEFT HAND - 10/2008 APPROX   Past Surgical History:  Procedure Laterality Date   APPLICATION OF WOUND VAC Left 01/09/2024   Procedure: APPLICATION, WOUND VAC;  Surgeon: Edna Toribio LABOR, MD;  Location: [facility];  Service: Orthopedics;  Laterality: Left;   APPLICATION OF WOUND VAC Left 02/18/2024   Procedure: APPLICATION, WOUND VAC;  Surgeon: Edna Toribio LABOR, MD;  Location: [facility];  Service: Orthopedics;  Laterality: Left;   CONVERSION TO TOTAL HIP Left 01/09/2024   Procedure: CONVERSION TO TOTAL HIP;  Surgeon: Edna Toribio LABOR, MD;  Location: [facility];  Service: Orthopedics;  Laterality: Left;   FEMUR IM NAIL Left 07/27/2022   Procedure: REPAIR FEMUR WITH AUTOGRAFT/ RIA;  Surgeon: Celena Sharper, MD;  Location: [facility] OR;  Service: Orthopedics;  Laterality: Left;   HARDWARE REMOVAL Left 06/06/2022   Procedure: HARDWARE REMOVAL;  Surgeon: Celena Sharper, MD;  Location: [facility] OR;  Service: Orthopedics;   Laterality: Left;   HARDWARE REMOVAL Bilateral 07/27/2022   Procedure: REPAIR OF LEFT PROXIMAL FEMUR NONUNION, REMOVAL OF ANTIBIOTIC SPACER, REAMED INTERMEDULLARY ASPRIATE RIGHT FEMUR;  Surgeon: Celena Sharper, MD;  Location: [facility] OR;  Service: Orthopedics;  Laterality: Bilateral;   HIP CLOSED REDUCTION Left 01/19/2024   Procedure: Attempted CLOSED REDUCTION, HIP;  Surgeon: Cristy Bonner DASEN, MD;  Location: [facility] OR;  Service: Orthopedics;  Laterality: Left;   HIP CLOSED REDUCTION  02/18/2024   Procedure: CLOSED REDUCTION, HIP;  Surgeon: Edna Toribio LABOR, MD;  Location: [facility];  Service: Orthopedics;;   INCISION AND DRAINAGE OF WOUND Left 02/18/2024   Procedure: IRRIGATION AND DEBRIDEMENT WOUND;  Surgeon: Edna Toribio LABOR, MD;  Location: [facility];  Service: Orthopedics;  Laterality: Left;   INTRAMEDULLARY (IM) NAIL INTERTROCHANTERIC Left 05/11/2021   Procedure: INTRAMEDULLARY (IM) NAIL INTERTROCHANTRIC OF LEFT SUBTHROCHANTRIC HIP FRACTURE;  Surgeon: Edie Norleen PARAS, MD;  Location: [facility] ORS;  Service: Orthopedics;  Laterality: Left;   ORIF FEMUR FRACTURE Left 06/06/2022   Procedure: OPEN REDUCTION INTERNAL FIXATION FEMORAL PROXIMAL FRACTURE;  Surgeon: Celena Sharper, MD;  Location: [facility] OR;  Service: Orthopedics;  Laterality: Left;   TOTAL HIP REVISION Left 01/21/2024   Procedure: Open Hip Reduction with Local Tissue Advancement;  Surgeon: Edna Toribio LABOR, MD;  Location: [facility] OR;  Service: Orthopedics;  Laterality: Left;   Family History  Problem Relation Age of Onset   Heart attack Mother    Hypertension Brother    Heart failure Paternal Grandfather    Social History:  reports that he quit smoking about 20 months ago. His smoking use included cigarettes. He has never used smokeless tobacco. He reports current alcohol  use. He reports that he does not use drugs. Allergies: No Known Allergies Medications Prior to Admission  Medication Sig Dispense Refill   acetaminophen  (TYLENOL ) 500 MG tablet Take  1,000 mg by mouth in the morning and at bedtime.     amLODipine  (NORVASC ) 10 MG tablet Take 10 mg by mouth in the morning.     aspirin  EC 81 MG tablet Take 81 mg by mouth 2 (two) times daily. Swallow whole.     atorvastatin  (LIPITOR) 20 MG tablet Take 20 mg by mouth at bedtime.     cyclobenzaprine  (FLEXERIL ) 10 MG tablet Take 1 tablet (10 mg total) by mouth every 12 (twelve) hours as needed for muscle spasms. 30 tablet 0   magnesium  hydroxide (MILK OF MAGNESIA) 400 MG/5ML suspension Take 15 mLs by mouth daily as needed (constipation.).     Menthol , Topical Analgesic, (BIOFREEZE EX) Apply 1 Application topically daily as needed (pain).     metoprolol  tartrate (LOPRESSOR ) 100 MG tablet Take 100 mg by mouth 2 (two) times daily.     ondansetron  (ZOFRAN ) 4 MG tablet Take 1 tablet (4 mg total) by mouth every 6 (six) hours as needed for nausea. 20 tablet 0   oxyCODONE  (OXY IR/ROXICODONE ) 5 MG immediate release tablet Take 5 mg by mouth every 4 (four) hours as needed for severe pain (pain score 7-10).     [DISCONTINUED] diclofenac  (VOLTAREN ) 75 MG EC tablet Take 1 tablet (75 mg  total) by mouth 2 (two) times daily. 60 tablet 0   [DISCONTINUED] aspirin  EC 81 MG tablet Take 1 tablet (81 mg total) by mouth 2 (two) times daily for 28 days. Swallow whole.     [DISCONTINUED] cyclobenzaprine  (FLEXERIL ) 10 MG tablet Take 1 tablet (10 mg total) by mouth every 12 (twelve) hours as needed for muscle spasms. 40 tablet 0      Home: Home Living Family/patient expects to be discharged to:: Private residence Living Arrangements:  (Lives with sister, Devere) Available Help at Discharge: Family, Available 24 hours/day Type of Home: Mobile home Home Access: Stairs to enter [employer] of Steps: 6 Entrance Stairs-Rails: Right, Left Home Layout: One level Bathroom Shower/Tub: Engineer, manufacturing systems: Standard Bathroom Accessibility: Yes Home Equipment: Information systems manager, BSC/3in1, Agricultural consultant (2  wheels), [employer] - single point, Mudlogger: Reacher, Long-handled sponge Additional Comments: planning to discharge to sister's house  Lives With:  (Sister, Devere)   Functional History: Prior Function Prior Level of Function : Independent/Modified Independent, Driving Mobility Comments: using RW PTA since previous surgery ADLs Comments: He was independent with ADLs and driving.  Functional Status:  Mobility: Bed Mobility Overal bed mobility: Needs Assistance Bed Mobility: Supine to Sit Supine to sit: Mod assist, HOB elevated, Used rails Sit to supine: Min assist General bed mobility comments:  (required assist for LLE and for trunk) Transfers Overall transfer level: Needs assistance Equipment used: Rolling walker (2 wheels) Transfers: Bed to chair/wheelchair/BSC, Sit to/from Stand Sit to Stand: Min assist, From elevated surface Bed to/from chair/wheelchair/BSC transfer type:: Step pivot General transfer comment: Cues for L LE management and hip precautions (not leaning forward).  STS x 3 during session. Ambulation/Gait Ambulation/Gait assistance: Min assist Gait Distance (Feet): 35 Feet (20' then 35') Assistive device: Rolling walker (2 wheels) Gait Pattern/deviations: Step-to pattern, Decreased step length - right, Decreased step length - left, Shuffle, Wide base of support General Gait Details: Cues for RW proximity; Gait velocity: decreased    ADL: ADL Overall ADL's : Needs assistance/impaired Eating/Feeding: Independent, Sitting Grooming: Set up, Sitting Grooming Details (indicate cue type and reason): at chair level Upper Body Bathing: Sitting, Contact guard assist Lower Body Bathing: Maximal assistance, Sit to/from stand Upper Body Dressing : Contact guard assist, Sitting Lower Body Dressing: Total assistance, Sitting/lateral leans Lower Body Dressing Details (indicate cue type and reason): to donn socks seated EOB Toilet Transfer: Minimal  assistance, +2 for physical assistance, +2 for safety/equipment, BSC/3in1, Rolling walker (2 wheels) Toilet Transfer Details (indicate cue type and reason): simulater to chair Toileting- Clothing Manipulation and Hygiene: Maximal assistance, Sit to/from stand Functional mobility during ADLs: Minimal assistance, Rolling walker (2 wheels), Cueing for sequencing, Cueing for safety  Cognition: Cognition Orientation Level: Oriented X4 Cognition Arousal: Alert Behavior During Therapy: WFL for tasks assessed/performed  Physical Exam: Blood pressure 123/65, pulse 73, temperature 97.9 F (36.6 C), temperature source Oral, resp. rate 15, height 5' 7 (1.702 m), weight 94.4 kg, SpO2 100%. Physical Exam Cardiovascular:     Rate and Rhythm: Normal rate and regular rhythm.     Pulses: Normal pulses.     Heart sounds: Normal heart sounds. No murmur heard. Pulmonary:     Effort: Pulmonary effort is normal.     Breath sounds: Normal breath sounds.  Abdominal:     General: Bowel sounds are decreased. There is distension.     Tenderness: There is no abdominal tenderness. There is no guarding.  Musculoskeletal:     Right lower  leg: No edema.     Left lower leg: Edema present.  Skin:    General: Skin is warm and dry.     Capillary Refill: Capillary refill takes less than 2 seconds.     Findings: No erythema.  Neurological:     Mental Status: He is oriented to person, place, and time. Mental status is at baseline.  Psychiatric:        Mood and Affect: Mood normal.        Behavior: Behavior normal.        Thought Content: Thought content normal.        Judgment: Judgment normal.     General: No apparent distress HEENT: Head is normocephalic, atraumatic, sclera anicteric, oral mucosa dry,  dentition decreased, wearing glasses Neck: Supple without JVD or lymphadenopathy Heart: Reg rate and rhythm. No murmurs rubs or gallops Chest: CTA bilaterally without wheezes, rales, or rhonchi; no  distress Abdomen: Soft, non-tender, mildly-distended, bowel sounds positive but decreased Extremities:  L hip wound vac-prevena- no drainage in canister  Psych: Pt's affect is appropriate. Pt is cooperative Skin: No breakdown noted on visible portion Neuro:    Mental Status: AAOx3 Speech/Languate: Fluent, follows simple commands CRANIAL NERVES: 2-12 grossly intact   MOTOR: RUE: 5/5 Deltoid, 5/5 Biceps, 5/5 Triceps,5/5 Grip LUE: 2-3/5 Deltoid, 5/5 Biceps, 5/5 Triceps, 5/5 Grip RLE: HF 5/5, KE 5/5, ADF 5/5, APF 5/5 LLE: HF 2/5, KE 4/5, ADF 5/5, APF 5/5  SENSORY: Normal to touch all 4 extremities  Coordination: No ataxia or dysmetria noted  MSK: Left leg swelling No significant TTP left shoulder, minimal pain reported with internal and external rotation Patient using hip abduction brace  Results for orders placed or performed during the hospital encounter of 02/18/24 (from the past 48 hours)  Magnesium      Status: None   Collection Time: 02/26/24  3:28 AM  Result Value Ref Range   Magnesium  1.8 1.7 - 2.4 mg/dL    Comment: Performed at [facility], [address] [place], [place] [address]  Phosphorus     Status: None   Collection Time: 02/26/24  3:28 AM  Result Value Ref Range   Phosphorus 3.0 2.5 - 4.6 mg/dL    Comment: Performed at [facility], [address] [place], [place] [address]  Basic metabolic panel     Status: Abnormal   Collection Time: 02/26/24  3:28 AM  Result Value Ref Range   Sodium 131 (L) 135 - 145 mmol/L   Potassium 3.8 3.5 - 5.1 mmol/L   Chloride 94 (L) 98 - 111 mmol/L   CO2 29 22 - 32 mmol/L   Glucose, Bld 106 (H) 70 - 99 mg/dL    Comment: Glucose reference range applies only to samples taken after fasting for at least 8 hours.   BUN <5 (L) 8 - 23 mg/dL   Creatinine, Ser 9.45 (L) 0.61 - 1.24 mg/dL   Calcium  7.5 (L) 8.9 - 10.3 mg/dL   GFR, Estimated >39 >39 mL/min    Comment: (NOTE) Calculated using the  CKD-EPI Creatinine Equation (2021)    Anion gap 8 5 - 15    Comment: Performed at [facility], [address], [place], [place] [address]  CBC     Status: Abnormal   Collection Time: 02/26/24  7:02 AM  Result Value Ref Range   WBC 9.3 4.0 - 10.5 K/uL   RBC 2.68 (L) 4.22 - 5.81 MIL/uL   Hemoglobin 7.5 (  L) 13.0 - 17.0 g/dL   HCT 76.0 (L) 60.9 - 47.9 %   MCV 89.2 80.0 - 100.0 fL   MCH 28.0 26.0 - 34.0 pg   MCHC 31.4 30.0 - 36.0 g/dL   RDW 84.0 (H) 88.4 - 84.4 %   Platelets 395 150 - 400 K/uL   nRBC 0.0 0.0 - 0.2 %    Comment: Performed at [facility], [address] [place], [place] [address]  Magnesium      Status: None   Collection Time: 02/27/24  3:40 AM  Result Value Ref Range   Magnesium  1.8 1.7 - 2.4 mg/dL    Comment: Performed at [facility], [address], [place], [place] [address]  Phosphorus     Status: None   Collection Time: 02/27/24  3:40 AM  Result Value Ref Range   Phosphorus 3.3 2.5 - 4.6 mg/dL    Comment: Performed at [facility], [address], [place], [place] [address]  Basic metabolic panel     Status: Abnormal   Collection Time: 02/27/24  3:40 AM  Result Value Ref Range   Sodium 133 (L) 135 - 145 mmol/L   Potassium 3.8 3.5 - 5.1 mmol/L   Chloride 97 (L) 98 - 111 mmol/L   CO2 25 22 - 32 mmol/L   Glucose, Bld 94 70 - 99 mg/dL    Comment: Glucose reference range applies only to samples taken after fasting for at least 8 hours.   BUN <5 (L) 8 - 23 mg/dL   Creatinine, Ser 9.54 (L) 0.61 - 1.24 mg/dL   Calcium  8.0 (L) 8.9 - 10.3 mg/dL   GFR, Estimated >39 >39 mL/min    Comment: (NOTE) Calculated using the CKD-EPI Creatinine Equation (2021)    Anion gap 11 5 - 15    Comment: Performed at [facility], [address], [place], [place] [address]   No results found.    Blood pressure 123/65, pulse 73, temperature 97.9 F (36.6 C), temperature source Oral,  resp. rate 15, height 5' 7 (1.702 m), weight 94.4 kg, SpO2 100%.  Medical Problem List and Plan: 1. Functional deficits secondary to recurrent dislocation left hip s/p closed reduction and irrigation, debridement.  Hip abduction brace when out of bed, pillow in bed.   -patient may not shower  -ELOS/Goals: Mod I Pt/Ot 10-12 days  -Admit to CIR 2.  Antithrombotics: -DVT/anticoagulation:  Mechanical: Sequential compression devices, below knee Bilateral lower extremities Pharmaceutical: Lovenox   Confirmed initiation with Dr. Leandrew below   -antiplatelet therapy: Aspirin  81 mg held due bleeding risk   - Venous Dopplers ordered 3. Pain Management: Oxycodone , Tylenol  and Robaxin  as needed 4. Mood/Behavior/Sleep: LCSW to follow for evaluation and support when available.   -antipsychotic agents: N/A 5. Neuropsych/cognition: This patient is capable of making decisions on his own behalf. 6. Skin/Wound Care: routine pressure relief measures monitor wound for signs of infection   -Prevena incisional wound VAC changed 8/5   -Post op Abx cefadroxil  500 mg BID for 7 days ends 8/13 7. Fluids/Electrolytes/Nutrition: monitor I/O and daily weights--recheck Labs in a.m.   -Regular diet-encourage fluid intake +Ensure supplement -constipation: 1 week since LBM -bowel program Miralax  and Senokot scheduled, patient  agreeable to enema if unsuccessful today. 8.  Acute on chronic anemia: hgb 8.8/28.6  monitor  PT/INR recheck in a.m  nursing notified to collect daily FOBT  - Continue ferrous sulfate  and folic acid  9. HTN:  Amlodipine  and metoprolol .  BP stable 10.HLD: Atorvastatin  11. Chronic nausea/vomiting: stable -GI has signed off--continue Protonix  40 mg BID  12. L shoulder weakness: check xray   Daphne LITTIE Finders, NP 02/27/2024  I have personally performed a face to face diagnostic evaluation of this patient and formulated the key components of the plan.  Additionally, I have personally reviewed laboratory  data, imaging studies, as well as relevant notes and concur with the physician assistant's documentation above.  The patient's status has not changed from the original H&P.  Any changes in documentation from the acute care chart have been noted above.  Murray Collier, MD

## 2024-02-27 NOTE — Progress Notes (Addendum)
   Inpatient Rehabilitation Admissions Coordinator   CIR bed at [facility] is available to admit him to today. Dr Urbano will be rehab MD to admit. I will verify bed number and contact [facility] for transport. [facility] staff to complete [facility] transport paperwork. Will be admitted to 4M01.  Heron Leavell, RN, MSN Rehab Admissions Coordinator [phone] 02/27/2024 9:19 AM

## 2024-02-28 ENCOUNTER — Inpatient Hospital Stay (HOSPITAL_COMMUNITY)

## 2024-02-28 DIAGNOSIS — S73005D Unspecified dislocation of left hip, subsequent encounter: Secondary | ICD-10-CM | POA: Diagnosis not present

## 2024-02-28 DIAGNOSIS — M623 Immobility syndrome (paraplegic): Secondary | ICD-10-CM | POA: Diagnosis not present

## 2024-02-28 LAB — COMPREHENSIVE METABOLIC PANEL WITH GFR
ALT: 16 U/L (ref 0–44)
AST: 31 U/L (ref 15–41)
Albumin: 1.8 g/dL — ABNORMAL LOW (ref 3.5–5.0)
Alkaline Phosphatase: 125 U/L (ref 38–126)
Anion gap: 8 (ref 5–15)
BUN: <5 mg/dL — ABNORMAL LOW (ref 8–23)
CO2: 28 mmol/L (ref 22–32)
Calcium: 8 mg/dL — ABNORMAL LOW (ref 8.9–10.3)
Chloride: 98 mmol/L (ref 98–111)
Creatinine, Ser: 0.64 mg/dL (ref 0.61–1.24)
GFR, Estimated: >60 mL/min (ref >60)
Glucose, Bld: 96 mg/dL (ref 70–99)
Potassium: 3.8 mmol/L (ref 3.5–5.1)
Sodium: 134 mmol/L — ABNORMAL LOW (ref 135–145)
Total Bilirubin: 0.5 mg/dL (ref 0.0–1.2)
Total Protein: 5.8 g/dL — ABNORMAL LOW (ref 6.5–8.1)

## 2024-02-28 LAB — CBC WITH DIFFERENTIAL/PLATELET
Abs Immature Granulocytes: 0.07 K/uL (ref 0.00–0.07)
Basophils Absolute: 0 K/uL (ref 0.0–0.1)
Basophils Relative: 0 %
Eosinophils Absolute: 0.1 K/uL (ref 0.0–0.5)
Eosinophils Relative: 1 %
HCT: 25 % — ABNORMAL LOW (ref 39.0–52.0)
Hemoglobin: 8 g/dL — ABNORMAL LOW (ref 13.0–17.0)
Immature Granulocytes: 1 %
Lymphocytes Relative: 10 %
Lymphs Abs: 0.9 K/uL (ref 0.7–4.0)
MCH: 27.9 pg (ref 26.0–34.0)
MCHC: 32 g/dL (ref 30.0–36.0)
MCV: 87.1 fL (ref 80.0–100.0)
Monocytes Absolute: 0.6 K/uL (ref 0.1–1.0)
Monocytes Relative: 7 %
Neutro Abs: 7.4 K/uL (ref 1.7–7.7)
Neutrophils Relative %: 81 %
Platelets: 397 K/uL (ref 150–400)
RBC: 2.87 MIL/uL — ABNORMAL LOW (ref 4.22–5.81)
RDW: 16.1 % — ABNORMAL HIGH (ref 11.5–15.5)
WBC: 9.2 K/uL (ref 4.0–10.5)
nRBC: 0 % (ref 0.0–0.2)

## 2024-02-28 LAB — PROTIME-INR
INR: 1.3 — ABNORMAL HIGH (ref 0.8–1.2)
Prothrombin Time: 16.5 s — ABNORMAL HIGH (ref 11.4–15.2)

## 2024-02-28 MED ORDER — PANTOPRAZOLE SODIUM 40 MG PO TBEC
40.0000 mg | DELAYED_RELEASE_TABLET | Freq: Two times a day (BID) | ORAL | Status: DC
  Administered 2024-02-28 – 2024-03-08 (×25): 40 mg via ORAL
  Filled 2024-02-28 (×19): qty 1

## 2024-02-28 NOTE — Evaluation (Signed)
 Occupational Therapy Assessment and Plan  Patient Details  Name: Jonathon Anderson MRN: [id number] Date of Birth: [birth year]  OT Diagnosis: abnormal posture, acute pain, muscle weakness (generalized), and decreased activity tolerance and balance strategies Rehab Potential: Rehab Potential (ACUTE ONLY): Good ELOS: 7-10 days   Today's Date: 02/28/2024 OT Individual Time: 0952-1100 OT Individual Time Calculation (min): 68 min     Hospital Problem: Principal Problem:   Dislocation, hip, left, subsequent encounter Active Problems:   Debility   Past Medical History:  Past Medical History:  Diagnosis Date   Arthritis    Hyperlipidemia    Hypertension    Nicotine dependence    Stroke (HCC)    LIGHT STROKE - NUMBNESS AND TINGLING IN LEFT HAND - 10/2008 APPROX   Past Surgical History:  Past Surgical History:  Procedure Laterality Date   APPLICATION OF WOUND VAC Left 01/09/2024   Procedure: APPLICATION, WOUND VAC;  Surgeon: Edna Toribio LABOR, MD;  Location: [facility];  Service: Orthopedics;  Laterality: Left;   APPLICATION OF WOUND VAC Left 02/18/2024   Procedure: APPLICATION, WOUND VAC;  Surgeon: Edna Toribio LABOR, MD;  Location: [facility];  Service: Orthopedics;  Laterality: Left;   CONVERSION TO TOTAL HIP Left 01/09/2024   Procedure: CONVERSION TO TOTAL HIP;  Surgeon: Edna Toribio LABOR, MD;  Location: [facility];  Service: Orthopedics;  Laterality: Left;   FEMUR IM NAIL Left 07/27/2022   Procedure: REPAIR FEMUR WITH AUTOGRAFT/ RIA;  Surgeon: Celena Sharper, MD;  Location: [facility] OR;  Service: Orthopedics;  Laterality: Left;   HARDWARE REMOVAL Left 06/06/2022   Procedure: HARDWARE REMOVAL;  Surgeon: Celena Sharper, MD;  Location: [facility] OR;  Service: Orthopedics;  Laterality: Left;   HARDWARE REMOVAL Bilateral 07/27/2022   Procedure: REPAIR OF LEFT PROXIMAL FEMUR NONUNION, REMOVAL OF ANTIBIOTIC SPACER, REAMED INTERMEDULLARY ASPRIATE RIGHT FEMUR;  Surgeon: Celena Sharper, MD;  Location: [facility] OR;   Service: Orthopedics;  Laterality: Bilateral;   HIP CLOSED REDUCTION Left 01/19/2024   Procedure: Attempted CLOSED REDUCTION, HIP;  Surgeon: Cristy Bonner DASEN, MD;  Location: [facility] OR;  Service: Orthopedics;  Laterality: Left;   HIP CLOSED REDUCTION  02/18/2024   Procedure: CLOSED REDUCTION, HIP;  Surgeon: Edna Toribio LABOR, MD;  Location: [facility];  Service: Orthopedics;;   INCISION AND DRAINAGE OF WOUND Left 02/18/2024   Procedure: IRRIGATION AND DEBRIDEMENT WOUND;  Surgeon: Edna Toribio LABOR, MD;  Location: [facility];  Service: Orthopedics;  Laterality: Left;   INTRAMEDULLARY (IM) NAIL INTERTROCHANTERIC Left 05/11/2021   Procedure: INTRAMEDULLARY (IM) NAIL INTERTROCHANTRIC OF LEFT SUBTHROCHANTRIC HIP FRACTURE;  Surgeon: Edie Norleen PARAS, MD;  Location: [facility] ORS;  Service: Orthopedics;  Laterality: Left;   ORIF FEMUR FRACTURE Left 06/06/2022   Procedure: OPEN REDUCTION INTERNAL FIXATION FEMORAL PROXIMAL FRACTURE;  Surgeon: Celena Sharper, MD;  Location: [facility] OR;  Service: Orthopedics;  Laterality: Left;   TOTAL HIP REVISION Left 01/21/2024   Procedure: Open Hip Reduction with Local Tissue Advancement;  Surgeon: Edna Toribio LABOR, MD;  Location: [facility] OR;  Service: Orthopedics;  Laterality: Left;    Assessment & Plan Clinical Impression: Patient is a 68 year old male with PMHx of history of HTN, HLD, and arthritis. He underwent a left total hip arthroplasty revision on 01/09/24 and a subsequent revision on January 21, 2024, following a dislocation due to a hardware removal, proximal femur replacement, and soft tissue advancement. His incisions continued to demonstrate poor wound healing with chronic clear drainage, leading to his admission to [facility] on 7/28 for closed  reduction, irrigation, and debridement of the left hip arthroplasty with wound vac placement by Dr. Edna. Postoperative complications included hypoxemia, anemia, and vomiting. A CT angiogram ruled out a pulmonary embolism.  Preoperatively, his hemoglobin was 9.5 and fell to 5.5 postoperatively, for which he received 2 units of packed red blood cells and 1 unit of FFP. Aspirin  was discontinued. GI was consulted and did not suspect a GI bleed and recommended supportive care with Protonix , it was felt that the anemia was likely secondary to bleeding at the surgical site in the setting of a supratherapeutic INR. A CT abdomen pelvis showed an incidental finding of a 3.7 x 2.2 cm heterogeneously enhancing lesion in the posterior right lobe of the liver, with further care through excision with MRI recommended. The MRI of the abdomen showed multiple liver cysts with the right hepatic lobe lesion having signal characteristics most consistent with a hemangioma. The patient received multiple doses of vitamin K  due to an INR of around 7.1. Pt reports L shoulder weakness since his injury. Oncology was consulted and agreed with the management. Prior to admission patient was Mod I with RW and independent with ADLs living in mobile home with 6 steps to enter. He currently requires assistance with ADLs with mod-min assist for mobility with device. Patient transferred to CIR on 70/12/2023 .    Patient currently requires min-mod A with basic self-care skills secondary to muscle weakness and muscle joint tightness, decreased cardiorespiratoy endurance, and decreased standing balance, decreased balance strategies, and difficulty maintaining precautions.  Prior to hospitalization, patient could complete BADLs with Mod I.   Patient will benefit from skilled intervention to increase independence with basic self-care skills prior to discharge home with care partner.  Anticipate patient will require intermittent supervision and follow up home health.  OT - End of Session Activity Tolerance: Tolerates 10 - 20 min activity with multiple rests Endurance Deficit: Yes OT Assessment Rehab Potential (ACUTE ONLY): Good OT Barriers to Discharge: Wound Care OT  Patient demonstrates impairments in the following area(s): Balance;Endurance;Pain;Safety OT Basic ADL's Functional Problem(s): Bathing;Dressing;Toileting OT Transfers Functional Problem(s): Toilet;Tub/Shower OT Additional Impairment(s): None OT Plan OT Intensity: Minimum of 1-2 x/day, 45 to 90 minutes OT Frequency: 5 out of 7 days OT Duration/Estimated Length of Stay: 7-10 days OT Treatment/Interventions: Balance/vestibular training;Discharge planning;Disease Conservator, museum/gallery;Functional mobility training;Neuromuscular re-education;Pain management;Patient/family education;Self Care/advanced ADL retraining;Skin care/wound managment;Splinting/orthotics;Therapeutic Activities;Therapeutic Exercise;UE/LE Strength taining/ROM OT Basic Self-Care Anticipated Outcome(s): Mod I OT Toileting Anticipated Outcome(s): Mod I OT Bathroom Transfers Anticipated Outcome(s): Mod I OT Recommendation Recommendations for Other Services: None Patient destination: Home Follow Up Recommendations: Home health OT Equipment Recommended: To be determined   OT Evaluation Precautions/Restrictions  Precautions Precautions: Posterior Hip Recall of Precautions/Restrictions: Intact Precaution/Restrictions Comments: Able to recall precautions, difficulty with implementation. Wound vac. Required Braces or Orthoses: Other Brace Other Brace: Abdcutor brace Restrictions Weight Bearing Restrictions Per Provider Order: Yes LLE Weight Bearing Per Provider Order: Weight bearing as tolerated Other Position/Activity Restrictions: With brace in place. General Chart Reviewed: Yes Family/Caregiver Present: No Pain Pain Assessment Pain Scale: 0-10 Pain Score: 0-No pain Home Living/Prior Functioning Home Living Family/patient expects to be discharged to:: Private residence Living Arrangements: Other relatives Available Help at Discharge: Family, Available 24 hours/day Type of Home:  Mobile home Home Access: Stairs to enter [employer] of Steps: 6 Entrance Stairs-Rails: Right, Left, Can reach both Home Layout: One level Bathroom Shower/Tub: Tub/shower unit (TTB) Bathroom Toilet: Handicapped height Bathroom Accessibility: Yes  Lives With: Other (Comment) (  Sister, Devere) IADL History Homemaking Responsibilities: No Current License: Yes Occupation: Retired Prior Function Level of Independence: Requires assistive device for independence, Independent with basic ADLs, Independent with homemaking with ambulation, Independent with gait, Independent with transfers Driving: Yes Vocation: Retired Administrator, sports Baseline Vision/History: 1 Wears glasses (Wears reafers daily.) Ability to See in Adequate Light: 1 Impaired Patient Visual Report: No change from baseline Vision Assessment?: Wears glasses for driving;Wears glasses for reading Perception  Perception: Within Functional Limits Praxis Praxis: WFL Cognition Cognition Overall Cognitive Status: Within Functional Limits for tasks assessed Arousal/Alertness: Awake/alert Orientation Level: Person;Place;Situation Awareness: Appears intact Problem Solving: Appears intact Safety/Judgment: Appears intact Brief Interview for Mental Status (BIMS) Repetition of Three Words (First Attempt): 3 Temporal Orientation: Year: Correct Temporal Orientation: Month: Accurate within 5 days Temporal Orientation: Day: Correct Recall: Sock: Yes, no cue required Recall: Blue: Yes, no cue required Recall: Bed: No, could not recall BIMS Summary Score: 13 Sensation Sensation Light Touch: Appears Intact Hot/Cold: Appears Intact Coordination Gross Motor Movements are Fluid and Coordinated: No Fine Motor Movements are Fluid and Coordinated: Yes Motor  Motor Motor: Within Functional Limits  Trunk/Postural Assessment  Cervical Assessment Cervical Assessment: Within Functional Limits Thoracic Assessment Thoracic Assessment:  Within Functional Limits Lumbar Assessment Lumbar Assessment: Within Functional Limits Postural Control Postural Control: Deficits on evaluation Righting Reactions: Decreased/delayed Protective Responses: Decreased/delayed  Balance Balance Balance Assessed: Yes Static Sitting Balance Static Sitting - Balance Support: Feet supported Static Sitting - Level of Assistance: 7: Independent Static Standing Balance Static Standing - Balance Support: Bilateral upper extremity supported Static Standing - Level of Assistance: 5: Stand by assistance (CGA) Dynamic Standing Balance Dynamic Standing - Balance Support: During functional activity;Bilateral upper extremity supported Dynamic Standing - Level of Assistance: 5: Stand by assistance;4: Min assist (CGA-Min A) Extremity/Trunk Assessment RUE Assessment Active Range of Motion (AROM) Comments: WFL General Strength Comments: 3-/5 LUE Assessment LUE Assessment: Exceptions to [facility] Active Range of Motion (AROM) Comments: ~30-40 degrees of shoulder flexion/abduction; WFL distally. General Strength Comments: 2-/5 LUE Body System: Ortho (X ray on 8/7 reports Moderate osteoarthritis of the left [facility] joint.)  Care Tool Care Tool Self Care Eating   Eating Assist Level: Independent    Oral Care    Oral Care Assist Level: Independent    Bathing   Body parts bathed by patient: Right arm;Left arm;Chest;Abdomen;Front perineal area;Buttocks;Right upper leg;Left upper leg;Face Body parts bathed by helper: Right lower leg;Left lower leg   Assist Level: Minimal Assistance - Patient > 75%    Upper Body Dressing(including orthotics)   What is the patient wearing?: Hospital gown only   Assist Level: Set up assist    Lower Body Dressing (excluding footwear)   What is the patient wearing?: Hospital gown only Assist for lower body dressing: Set up assist    Putting on/Taking off footwear   What is the patient wearing?: Non-skid slipper socks Assist  for footwear: Maximal Assistance - Patient 25 - 49%       Care Tool Toileting Toileting activity   Assist for toileting: Minimal Assistance - Patient > 75%     Care Tool Bed Mobility Roll left and right activity        Sit to lying activity        Lying to sitting on side of bed activity         Care Tool Transfers Sit to stand transfer        Chair/bed transfer         Toilet  transfer   Assist Level: Minimal Assistance - Patient > 75%     Care Tool Cognition  Expression of Ideas and Wants Expression of Ideas and Wants: 4. Without difficulty (complex and basic) - expresses complex messages without difficulty and with speech that is clear and easy to understand  Understanding Verbal and Non-Verbal Content Understanding Verbal and Non-Verbal Content: 4. Understands (complex and basic) - clear comprehension without cues or repetitions   Memory/Recall Ability Memory/Recall Ability : That he or she is in a hospital/hospital unit;Current season   Refer to Care Plan for Long Term Goals  SHORT TERM GOAL WEEK 1 OT Short Term Goal 1 (Week 1): STGs=LTGs due to estimated length of stay.  Recommendations for other services: None    Skilled Therapeutic Intervention  Session began with introduction to OT role, OT POC, and general orientation to rehab unit/schedule. Abduction brace donned. Pt declines ADLs this session due to bowel urgency post-laxative administration. Levels of assistance noted below based on simulated movements/clinical reasoning. Verbally reviewed options for AE to assist with LB ADLs and adherence to posterior hip precautions. Educational signage posted above patient bed for carryover of precautions into daily nursing care. Pt ambulates short distance within room, requiring CGA-Min A + RW, verbal cuing not to pivot L-toes inward/outward with movement. Sit>supine with Min A for LLE elevation, abductions pillow donned.   ADL ADL Eating: Independent Where  Assessed-Eating: Bed level;Wheelchair Grooming: Modified independent Where Assessed-Grooming: Sitting at sink Upper Body Bathing: Setup Where Assessed-Upper Body Bathing: Edge of bed Lower Body Bathing: Minimal assistance;Moderate assistance Where Assessed-Lower Body Bathing: Edge of bed Upper Body Dressing: Setup Where Assessed-Upper Body Dressing: Edge of bed Lower Body Dressing: Minimal assistance;Moderate assistance Where Assessed-Lower Body Dressing: Edge of bed Toileting: Minimal assistance Where Assessed-Toileting: Toilet;Bedside Commode Toilet Transfer: Contact guard;Minimal assistance Toilet Transfer Method: Ambulating Tub/Shower Transfer: Unable to assess Film/video editor: Unable to assess Mobility  Transfers Sit to Stand: Contact Guard/Touching assist;Minimal Assistance - Patient > 75% Stand to Sit: Contact Guard/Touching assist;Minimal Assistance - Patient > 75%   Discharge Criteria: Patient will be discharged from OT if patient refuses treatment 3 consecutive times without medical reason, if treatment goals not met, if there is a change in medical status, if patient makes no progress towards goals or if patient is discharged from hospital.  The above assessment, treatment plan, treatment alternatives and goals were discussed and mutually agreed upon: by patient  Nereida Habermann, OTR/L, MSOT  02/28/2024, 11:04 AM

## 2024-02-28 NOTE — Plan of Care (Signed)
  Problem: RH Balance Goal: LTG Patient will maintain dynamic standing with ADLs (OT) Description: LTG:  Patient will maintain dynamic standing balance with assist during activities of daily living (OT)  Flowsheets (Taken 02/28/2024 1330) LTG: Pt will maintain dynamic standing balance during ADLs with: Independent with assistive device   Problem: Sit to Stand Goal: LTG:  Patient will perform sit to stand in prep for activites of daily living with assistance level (OT) Description: LTG:  Patient will perform sit to stand in prep for activites of daily living with assistance level (OT) Flowsheets (Taken 02/28/2024 1330) LTG: PT will perform sit to stand in prep for activites of daily living with assistance level: Independent with assistive device   Problem: RH Dressing Goal: LTG Patient will perform upper body dressing (OT) Description: LTG Patient will perform upper body dressing with assist, with/without cues (OT). Flowsheets (Taken 02/28/2024 1330) LTG: Pt will perform upper body dressing with assistance level of: Independent with assistive device Goal: LTG Patient will perform lower body dressing w/assist (OT) Description: LTG: Patient will perform lower body dressing with assist, with/without cues in positioning using equipment (OT) Flowsheets (Taken 02/28/2024 1330) LTG: Pt will perform lower body dressing with assistance level of: Independent with assistive device   Problem: RH Toileting Goal: LTG Patient will perform toileting task (3/3 steps) with assistance level (OT) Description: LTG: Patient will perform toileting task (3/3 steps) with assistance level (OT)  Flowsheets (Taken 02/28/2024 1330) LTG: Pt will perform toileting task (3/3 steps) with assistance level: Independent with assistive device   Problem: RH Toilet Transfers Goal: LTG Patient will perform toilet transfers w/assist (OT) Description: LTG: Patient will perform toilet transfers with assist, with/without cues using  equipment (OT) Flowsheets (Taken 02/28/2024 1330) LTG: Pt will perform toilet transfers with assistance level of: Independent with assistive device   Problem: RH Tub/Shower Transfers Goal: LTG Patient will perform tub/shower transfers w/assist (OT) Description: LTG: Patient will perform tub/shower transfers with assist, with/without cues using equipment (OT) Flowsheets (Taken 02/28/2024 1330) LTG: Pt will perform tub/shower stall transfers with assistance level of: Independent with assistive device

## 2024-02-28 NOTE — H&P (Addendum)
 Physical Medicine and Rehabilitation Admission H&P        Chief Complaint  Patient presents with   Functional deficits due to debility    : HPI: Jonathon Anderson is a 68 year old male with PMHx of history of HTN, HLD, and arthritis. He underwent a left total hip arthroplasty revision on 01/09/24 and a subsequent revision on January 21, 2024, following a dislocation due to a hardware removal, proximal femur replacement, and soft tissue advancement. His incisions continued to demonstrate poor wound healing with chronic clear drainage, leading to his admission to [facility] on 7/28 for closed reduction, irrigation, and debridement of the left hip arthroplasty with wound vac placement by Dr. Edna.  Postoperative complications included hypoxemia, anemia, and vomiting. A CT angiogram ruled out a pulmonary embolism. Preoperatively, his hemoglobin was 9.5 and fell to 5.5 postoperatively, for which he received 2 units of packed red blood cells and 1 unit of FFP. Aspirin  was discontinued. GI was consulted and did not suspect a GI bleed and recommended supportive care with Protonix , it was felt that the anemia was likely secondary to bleeding at the surgical site in the setting of a supratherapeutic INR. A CT abdomen pelvis showed an incidental finding of a 3.7 x 2.2 cm heterogeneously enhancing lesion in the posterior right lobe of the liver, with further care through excision with MRI recommended. The MRI of the abdomen showed multiple liver cysts with the right hepatic lobe lesion having signal characteristics most consistent with a hemangioma. The patient received multiple doses of vitamin K  due to an INR of around 7.1. Pt reports L shoulder weakness since his injury. Oncology was consulted and agreed with the management. Prior to admission patient was Mod I with RW and independent with ADLs living in mobile home with 6 steps to enter. He currently requires assistance with ADLs with mod-min  assist for mobility with device. Therapy evaluations completed due to patient decreased functional mobility was admitted for a comprehensive rehab program.        Review of Systems  Constitutional: Negative.   HENT: Negative.    Eyes:  Negative for blurred vision and double vision.  Respiratory: Negative.    Cardiovascular:  Negative for chest pain, palpitations and leg swelling.  Gastrointestinal:  Positive for constipation (LMB 1 week ago). Negative for abdominal pain, diarrhea, nausea and vomiting.  Genitourinary: Negative.   Musculoskeletal:  Positive for back pain and myalgias.       LLE edema  Skin:  Negative for rash.       Prevena present left hip  Neurological:  Negative for tingling, sensory change, focal weakness and headaches.  Endo/Heme/Allergies: Negative.   Psychiatric/Behavioral:  The patient does not have insomnia.        Past Medical History:  Diagnosis Date   Arthritis     Hyperlipidemia     Hypertension     Nicotine dependence     Stroke (HCC)      LIGHT STROKE - NUMBNESS AND TINGLING IN LEFT HAND - 10/2008 APPROX             Past Surgical History:  Procedure Laterality Date   APPLICATION OF WOUND VAC Left 01/09/2024    Procedure: APPLICATION, WOUND VAC;  Surgeon: Edna Toribio LABOR, MD;  Location: [facility];  Service: Orthopedics;  Laterality: Left;   APPLICATION OF WOUND VAC Left 02/18/2024    Procedure: APPLICATION, WOUND VAC;  Surgeon: Edna Toribio LABOR, MD;  Location: [facility];  Service: Orthopedics;  Laterality: Left;   CONVERSION TO TOTAL HIP Left 01/09/2024    Procedure: CONVERSION TO TOTAL HIP;  Surgeon: Edna Toribio LABOR, MD;  Location: [facility];  Service: Orthopedics;  Laterality: Left;   FEMUR IM NAIL Left 07/27/2022    Procedure: REPAIR FEMUR WITH AUTOGRAFT/ RIA;  Surgeon: Celena Sharper, MD;  Location: [facility] OR;  Service: Orthopedics;  Laterality: Left;   HARDWARE REMOVAL Left 06/06/2022    Procedure: HARDWARE REMOVAL;  Surgeon: Celena Sharper,  MD;  Location: [facility] OR;  Service: Orthopedics;  Laterality: Left;   HARDWARE REMOVAL Bilateral 07/27/2022    Procedure: REPAIR OF LEFT PROXIMAL FEMUR NONUNION, REMOVAL OF ANTIBIOTIC SPACER, REAMED INTERMEDULLARY ASPRIATE RIGHT FEMUR;  Surgeon: Celena Sharper, MD;  Location: [facility] OR;  Service: Orthopedics;  Laterality: Bilateral;   HIP CLOSED REDUCTION Left 01/19/2024    Procedure: Attempted CLOSED REDUCTION, HIP;  Surgeon: Cristy Bonner DASEN, MD;  Location: [facility] OR;  Service: Orthopedics;  Laterality: Left;   HIP CLOSED REDUCTION   02/18/2024    Procedure: CLOSED REDUCTION, HIP;  Surgeon: Edna Toribio LABOR, MD;  Location: [facility];  Service: Orthopedics;;   INCISION AND DRAINAGE OF WOUND Left 02/18/2024    Procedure: IRRIGATION AND DEBRIDEMENT WOUND;  Surgeon: Edna Toribio LABOR, MD;  Location: [facility];  Service: Orthopedics;  Laterality: Left;   INTRAMEDULLARY (IM) NAIL INTERTROCHANTERIC Left 05/11/2021    Procedure: INTRAMEDULLARY (IM) NAIL INTERTROCHANTRIC OF LEFT SUBTHROCHANTRIC HIP FRACTURE;  Surgeon: Edie Norleen PARAS, MD;  Location: [facility] ORS;  Service: Orthopedics;  Laterality: Left;   ORIF FEMUR FRACTURE Left 06/06/2022    Procedure: OPEN REDUCTION INTERNAL FIXATION FEMORAL PROXIMAL FRACTURE;  Surgeon: Celena Sharper, MD;  Location: [facility] OR;  Service: Orthopedics;  Laterality: Left;   TOTAL HIP REVISION Left 01/21/2024    Procedure: Open Hip Reduction with Local Tissue Advancement;  Surgeon: Edna Toribio LABOR, MD;  Location: [facility] OR;  Service: Orthopedics;  Laterality: Left;             Family History  Problem Relation Age of Onset   Heart attack Mother     Hypertension Brother     Heart failure Paternal Grandfather          Social History:  reports that he quit smoking about 20 months ago. His smoking use included cigarettes. He has never used smokeless tobacco. He reports current alcohol  use. He reports that he does not use drugs. Allergies:  Allergies  No Known Allergies         Medications  Prior to Admission  Medication Sig Dispense Refill   acetaminophen  (TYLENOL ) 500 MG tablet Take 1,000 mg by mouth in the morning and at bedtime.       amLODipine  (NORVASC ) 10 MG tablet Take 10 mg by mouth in the morning.       aspirin  EC 81 MG tablet Take 81 mg by mouth 2 (two) times daily. Swallow whole.       atorvastatin  (LIPITOR) 20 MG tablet Take 20 mg by mouth at bedtime.       cyclobenzaprine  (FLEXERIL ) 10 MG tablet Take 1 tablet (10 mg total) by mouth every 12 (twelve) hours as needed for muscle spasms. 30 tablet 0   magnesium  hydroxide (MILK OF MAGNESIA) 400 MG/5ML suspension Take 15 mLs by mouth daily as needed (constipation.).       Menthol , Topical Analgesic, (BIOFREEZE EX) Apply 1 Application topically daily as needed (pain).       metoprolol  tartrate (LOPRESSOR )  100 MG tablet Take 100 mg by mouth 2 (two) times daily.       ondansetron  (ZOFRAN ) 4 MG tablet Take 1 tablet (4 mg total) by mouth every 6 (six) hours as needed for nausea. 20 tablet 0   oxyCODONE  (OXY IR/ROXICODONE ) 5 MG immediate release tablet Take 5 mg by mouth every 4 (four) hours as needed for severe pain (pain score 7-10).       [DISCONTINUED] diclofenac  (VOLTAREN ) 75 MG EC tablet Take 1 tablet (75 mg total) by mouth 2 (two) times daily. 60 tablet 0   [DISCONTINUED] aspirin  EC 81 MG tablet Take 1 tablet (81 mg total) by mouth 2 (two) times daily for 28 days. Swallow whole.       [DISCONTINUED] cyclobenzaprine  (FLEXERIL ) 10 MG tablet Take 1 tablet (10 mg total) by mouth every 12 (twelve) hours as needed for muscle spasms. 40 tablet 0              Home: Home Living Family/patient expects to be discharged to:: Private residence Living Arrangements:  (Lives with sister, Devere) Available Help at Discharge: Family, Available 24 hours/day Type of Home: Mobile home Home Access: Stairs to enter [employer] of Steps: 6 Entrance Stairs-Rails: Right, Left Home Layout: One level Bathroom Shower/Tub:  Engineer, manufacturing systems: Standard Bathroom Accessibility: Yes Home Equipment: Information systems manager, BSC/3in1, Agricultural consultant (2 wheels), [employer] - single point, Mudlogger: Reacher, Long-handled sponge Additional Comments: planning to discharge to sister's house  Lives With:  (Sister, Devere)   Functional History: Prior Function Prior Level of Function : Independent/Modified Independent, Driving Mobility Comments: using RW PTA since previous surgery ADLs Comments: He was independent with ADLs and driving.   Functional Status:  Mobility: Bed Mobility Overal bed mobility: Needs Assistance Bed Mobility: Supine to Sit Supine to sit: Mod assist, HOB elevated, Used rails Sit to supine: Min assist General bed mobility comments:  (required assist for LLE and for trunk) Transfers Overall transfer level: Needs assistance Equipment used: Rolling walker (2 wheels) Transfers: Bed to chair/wheelchair/BSC, Sit to/from Stand Sit to Stand: Min assist, From elevated surface Bed to/from chair/wheelchair/BSC transfer type:: Step pivot General transfer comment: Cues for L LE management and hip precautions (not leaning forward).  STS x 3 during session. Ambulation/Gait Ambulation/Gait assistance: Min assist Gait Distance (Feet): 35 Feet (20' then 35') Assistive device: Rolling walker (2 wheels) Gait Pattern/deviations: Step-to pattern, Decreased step length - right, Decreased step length - left, Shuffle, Wide base of support General Gait Details: Cues for RW proximity; Gait velocity: decreased   ADL: ADL Overall ADL's : Needs assistance/impaired Eating/Feeding: Independent, Sitting Grooming: Set up, Sitting Grooming Details (indicate cue type and reason): at chair level Upper Body Bathing: Sitting, Contact guard assist Lower Body Bathing: Maximal assistance, Sit to/from stand Upper Body Dressing : Contact guard assist, Sitting Lower Body Dressing: Total assistance,  Sitting/lateral leans Lower Body Dressing Details (indicate cue type and reason): to donn socks seated EOB Toilet Transfer: Minimal assistance, +2 for physical assistance, +2 for safety/equipment, BSC/3in1, Rolling walker (2 wheels) Toilet Transfer Details (indicate cue type and reason): simulater to chair Toileting- Clothing Manipulation and Hygiene: Maximal assistance, Sit to/from stand Functional mobility during ADLs: Minimal assistance, Rolling walker (2 wheels), Cueing for sequencing, Cueing for safety   Cognition: Cognition Orientation Level: Oriented X4 Cognition Arousal: Alert Behavior During Therapy: WFL for tasks assessed/performed   Physical Exam: Blood pressure 123/65, pulse 73, temperature 97.9 F (36.6 C), temperature source Oral, resp. rate 15,  height 5' 7 (1.702 m), weight 94.4 kg, SpO2 100%. Physical Exam Cardiovascular:     Rate and Rhythm: Normal rate and regular rhythm.     Pulses: Normal pulses.     Heart sounds: Normal heart sounds. No murmur heard. Pulmonary:     Effort: Pulmonary effort is normal.     Breath sounds: Normal breath sounds.  Abdominal:     General: Bowel sounds are decreased. There is distension.     Tenderness: There is no abdominal tenderness. There is no guarding.  Musculoskeletal:     Right lower leg: No edema.     Left lower leg: Edema present.  Skin:    General: Skin is warm and dry.     Capillary Refill: Capillary refill takes less than 2 seconds.     Findings: No erythema.  Neurological:     Mental Status: He is oriented to person, place, and time. Mental status is at baseline.  Psychiatric:        Mood and Affect: Mood normal.        Behavior: Behavior normal.        Thought Content: Thought content normal.        Judgment: Judgment normal.       General: No apparent distress HEENT: Head is normocephalic, atraumatic, sclera anicteric, oral mucosa dry,  dentition decreased, wearing glasses Neck: Supple without JVD or  lymphadenopathy Heart: Reg rate and rhythm. No murmurs rubs or gallops Chest: CTA bilaterally without wheezes, rales, or rhonchi; no distress Abdomen: Soft, non-tender, mildly-distended, bowel sounds positive but decreased Extremities:  L hip wound vac-prevena- no drainage in canister  Psych: Pt's affect is appropriate. Pt is cooperative Skin: No breakdown noted on visible portion Neuro:     Mental Status: AAOx3 Speech/Languate: Fluent, follows simple commands CRANIAL NERVES: 2-12 grossly intact     MOTOR: RUE: 5/5 Deltoid, 5/5 Biceps, 5/5 Triceps,5/5 Grip LUE: 2-3/5 Deltoid, 5/5 Biceps, 5/5 Triceps, 5/5 Grip RLE: HF 5/5, KE 5/5, ADF 5/5, APF 5/5 LLE: HF 2/5, KE 4/5, ADF 5/5, APF 5/5   SENSORY: Normal to touch all 4 extremities   Coordination: No ataxia or dysmetria noted   MSK: Left leg swelling No significant TTP left shoulder, minimal pain reported with internal and external rotation Patient using hip abduction pillow   Lab Results Last 48 Hours        Results for orders placed or performed during the hospital encounter of 02/18/24 (from the past 48 hours)  Magnesium      Status: None    Collection Time: 02/26/24  3:28 AM  Result Value Ref Range    Magnesium  1.8 1.7 - 2.4 mg/dL      Comment: Performed at [facility], [address], [place], [place] [address]  Phosphorus     Status: None    Collection Time: 02/26/24  3:28 AM  Result Value Ref Range    Phosphorus 3.0 2.5 - 4.6 mg/dL      Comment: Performed at [facility], [address], [place], [place] [address]  Basic metabolic panel     Status: Abnormal    Collection Time: 02/26/24  3:28 AM  Result Value Ref Range    Sodium 131 (L) 135 - 145 mmol/L    Potassium 3.8 3.5 - 5.1 mmol/L    Chloride 94 (L) 98 - 111 mmol/L    CO2 29 22 - 32 mmol/L    Glucose, Bld 106 (H) 70 - 99 mg/dL  Comment: Glucose reference range applies only to samples taken after fasting for at least  8 hours.    BUN <5 (L) 8 - 23 mg/dL    Creatinine, Ser 9.45 (L) 0.61 - 1.24 mg/dL    Calcium  7.5 (L) 8.9 - 10.3 mg/dL    GFR, Estimated >39 >39 mL/min      Comment: (NOTE) Calculated using the CKD-EPI Creatinine Equation (2021)      Anion gap 8 5 - 15      Comment: Performed at [facility], [address], [place], [place] [address]  CBC     Status: Abnormal    Collection Time: 02/26/24  7:02 AM  Result Value Ref Range    WBC 9.3 4.0 - 10.5 K/uL    RBC 2.68 (L) 4.22 - 5.81 MIL/uL    Hemoglobin 7.5 (L) 13.0 - 17.0 g/dL    HCT 76.0 (L) 60.9 - 52.0 %    MCV 89.2 80.0 - 100.0 fL    MCH 28.0 26.0 - 34.0 pg    MCHC 31.4 30.0 - 36.0 g/dL    RDW 84.0 (H) 88.4 - 15.5 %    Platelets 395 150 - 400 K/uL    nRBC 0.0 0.0 - 0.2 %      Comment: Performed at [facility], [address], [place], [place] [address]  Magnesium      Status: None    Collection Time: 02/27/24  3:40 AM  Result Value Ref Range    Magnesium  1.8 1.7 - 2.4 mg/dL      Comment: Performed at [facility], [address] [place], [place] [address]  Phosphorus     Status: None    Collection Time: 02/27/24  3:40 AM  Result Value Ref Range    Phosphorus 3.3 2.5 - 4.6 mg/dL      Comment: Performed at [facility], [address], [place], [place] [address]  Basic metabolic panel     Status: Abnormal    Collection Time: 02/27/24  3:40 AM  Result Value Ref Range    Sodium 133 (L) 135 - 145 mmol/L    Potassium 3.8 3.5 - 5.1 mmol/L    Chloride 97 (L) 98 - 111 mmol/L    CO2 25 22 - 32 mmol/L    Glucose, Bld 94 70 - 99 mg/dL      Comment: Glucose reference range applies only to samples taken after fasting for at least 8 hours.    BUN <5 (L) 8 - 23 mg/dL    Creatinine, Ser 9.54 (L) 0.61 - 1.24 mg/dL    Calcium  8.0 (L) 8.9 - 10.3 mg/dL    GFR, Estimated >39 >39 mL/min      Comment: (NOTE) Calculated using the CKD-EPI Creatinine Equation (2021)       Anion gap 11 5 - 15      Comment: Performed at [facility], [address] [place], [place] [address]      Imaging Results (Last 48 hours)  No results found.         Blood pressure 123/65, pulse 73, temperature 97.9 F (36.6 C), temperature source Oral, resp. rate 15, height 5' 7 (1.702 m), weight 94.4 kg, SpO2 100%.   Medical Problem List and Plan: 1. Functional deficits secondary to recurrent dislocation left hip s/p closed reduction and irrigation, debridement.  Hip abduction brace when out of bed, pillow in bed.  WBAT LLE with strict posterior hip  precautions             -patient may not shower             -ELOS/Goals: Mod I Pt/Ot 10-12 days             -Admit to CIR 2.  Antithrombotics: -DVT/anticoagulation:  Mechanical: Sequential compression devices, below knee Bilateral lower extremities Pharmaceutical: Lovenox   Confirmed initiation with Dr. Leandrew below              -antiplatelet therapy: Aspirin  81 mg held due bleeding risk              - Venous Dopplers ordered 3. Pain Management: Oxycodone , Tylenol  and Robaxin  as needed 4. Mood/Behavior/Sleep: LCSW to follow for evaluation and support when available.              -antipsychotic agents: N/A 5. Neuropsych/cognition: This patient is capable of making decisions on his own behalf. 6. Skin/Wound Care: routine pressure relief measures monitor wound for signs of infection              -Prevena incisional wound VAC changed 8/5 , Continue for 7-10 more days             -Post op Abx cefadroxil  500 mg BID for 7 days ends 8/13. Ortho note indicated plan for bactrim , however cefadroxil  noted on DC summary  7. Fluids/Electrolytes/Nutrition: monitor I/O and daily weights--recheck Labs in a.m.              -Regular diet-encourage fluid intake +Ensure supplement -constipation: 1 week since LBM -bowel program Miralax  and Senokot scheduled, patient  agreeable to enema if unsuccessful today. 8.  Acute on chronic  anemia: hgb 8.8/28.6  monitor  PT/INR recheck in a.m  nursing notified to collect daily FOBT  - Continue ferrous sulfate  and folic acid  9. HTN:  Amlodipine  and metoprolol . BP stable 10.HLD: Atorvastatin  11. Chronic nausea/vomiting: stable -GI has signed off--continue Protonix  40 mg BID  12. L shoulder weakness: pt reports this began around time of hip injury, Will check shoulder xray     Daphne LITTIE Finders, NP 02/27/2024   I have personally performed a face to face diagnostic evaluation of this patient and formulated the key components of the plan.  Additionally, I have personally reviewed laboratory data, imaging studies, as well as relevant notes and concur with the physician assistant's documentation above.   The patient's status has not changed from the original H&P.  Any changes in documentation from the acute care chart have been noted above.   Murray Collier, MD

## 2024-02-28 NOTE — Progress Notes (Signed)
 Inpatient Rehabilitation  Patient information reviewed and entered into eRehab system by [facility]. Karen Kays., CCC/SLP, PPS Coordinator.  Information including medical coding, functional ability and quality indicators will be reviewed and updated through discharge.

## 2024-02-28 NOTE — Plan of Care (Signed)
  Problem: Consults Goal: RH GENERAL PATIENT EDUCATION Description: See Patient Education module for education specifics. Outcome: Progressing   Problem: RH BOWEL ELIMINATION Goal: RH STG MANAGE BOWEL WITH ASSISTANCE Description: STG Manage Bowel with  mod I Assistance. Outcome: Progressing   Problem: RH BLADDER ELIMINATION Goal: RH STG MANAGE BLADDER WITH ASSISTANCE Description: STG Manage Bladder With mod I Assistance Outcome: Progressing   Problem: RH SKIN INTEGRITY Goal: RH STG SKIN FREE OF INFECTION/BREAKDOWN Description: Manage ski breakdown with mod I assistance Outcome: Progressing   Problem: RH SAFETY Goal: RH STG ADHERE TO SAFETY PRECAUTIONS W/ASSISTANCE/DEVICE Description: STG Adhere to Safety Precautions With mod I Assistance/Device. Outcome: Progressing   Problem: RH PAIN MANAGEMENT Goal: RH STG PAIN MANAGED AT OR BELOW PT'S PAIN GOAL Description: <4 w/ prns Outcome: Progressing   Problem: RH KNOWLEDGE DEFICIT GENERAL Goal: RH STG INCREASE KNOWLEDGE OF SELF CARE AFTER HOSPITALIZATION Description: Manage increase knowledge of self care after hospitalization with mod I assistance from sister using educational materials provided Outcome: Progressing

## 2024-02-28 NOTE — Evaluation (Signed)
 Physical Therapy Assessment and Plan  Patient Details  Name: Jonathon Anderson MRN: [id number] Date of Birth: [birth year]  PT Diagnosis: Difficulty walking and Muscle weakness Rehab Potential: Good ELOS: 7-10 Days   Today's Date: 02/28/2024 PT Individual Time: 9152-9055 PT Individual Time Calculation (min): 57 min    Today's Date: 02/28/2024 PT Individual Time: 8681-8570 PT Individual Time Calculation (min): 71 min   Hospital Problem: Principal Problem:   Dislocation, hip, left, subsequent encounter Active Problems:   Debility   Past Medical History:  Past Medical History:  Diagnosis Date   Arthritis    Hyperlipidemia    Hypertension    Nicotine dependence    Stroke (HCC)    LIGHT STROKE - NUMBNESS AND TINGLING IN LEFT HAND - 10/2008 APPROX   Past Surgical History:  Past Surgical History:  Procedure Laterality Date   APPLICATION OF WOUND VAC Left 01/09/2024   Procedure: APPLICATION, WOUND VAC;  Surgeon: Edna Toribio LABOR, MD;  Location: [facility];  Service: Orthopedics;  Laterality: Left;   APPLICATION OF WOUND VAC Left 02/18/2024   Procedure: APPLICATION, WOUND VAC;  Surgeon: Edna Toribio LABOR, MD;  Location: [facility];  Service: Orthopedics;  Laterality: Left;   CONVERSION TO TOTAL HIP Left 01/09/2024   Procedure: CONVERSION TO TOTAL HIP;  Surgeon: Edna Toribio LABOR, MD;  Location: [facility];  Service: Orthopedics;  Laterality: Left;   FEMUR IM NAIL Left 07/27/2022   Procedure: REPAIR FEMUR WITH AUTOGRAFT/ RIA;  Surgeon: Celena Sharper, MD;  Location: [facility] OR;  Service: Orthopedics;  Laterality: Left;   HARDWARE REMOVAL Left 06/06/2022   Procedure: HARDWARE REMOVAL;  Surgeon: Celena Sharper, MD;  Location: [facility] OR;  Service: Orthopedics;  Laterality: Left;   HARDWARE REMOVAL Bilateral 07/27/2022   Procedure: REPAIR OF LEFT PROXIMAL FEMUR NONUNION, REMOVAL OF ANTIBIOTIC SPACER, REAMED INTERMEDULLARY ASPRIATE RIGHT FEMUR;  Surgeon: Celena Sharper, MD;  Location: [facility] OR;  Service:  Orthopedics;  Laterality: Bilateral;   HIP CLOSED REDUCTION Left 01/19/2024   Procedure: Attempted CLOSED REDUCTION, HIP;  Surgeon: Cristy Bonner DASEN, MD;  Location: [facility] OR;  Service: Orthopedics;  Laterality: Left;   HIP CLOSED REDUCTION  02/18/2024   Procedure: CLOSED REDUCTION, HIP;  Surgeon: Edna Toribio LABOR, MD;  Location: [facility];  Service: Orthopedics;;   INCISION AND DRAINAGE OF WOUND Left 02/18/2024   Procedure: IRRIGATION AND DEBRIDEMENT WOUND;  Surgeon: Edna Toribio LABOR, MD;  Location: [facility];  Service: Orthopedics;  Laterality: Left;   INTRAMEDULLARY (IM) NAIL INTERTROCHANTERIC Left 05/11/2021   Procedure: INTRAMEDULLARY (IM) NAIL INTERTROCHANTRIC OF LEFT SUBTHROCHANTRIC HIP FRACTURE;  Surgeon: Edie Norleen PARAS, MD;  Location: [facility] ORS;  Service: Orthopedics;  Laterality: Left;   ORIF FEMUR FRACTURE Left 06/06/2022   Procedure: OPEN REDUCTION INTERNAL FIXATION FEMORAL PROXIMAL FRACTURE;  Surgeon: Celena Sharper, MD;  Location: [facility] OR;  Service: Orthopedics;  Laterality: Left;   TOTAL HIP REVISION Left 01/21/2024   Procedure: Open Hip Reduction with Local Tissue Advancement;  Surgeon: Edna Toribio LABOR, MD;  Location: [facility] OR;  Service: Orthopedics;  Laterality: Left;    Assessment & Plan Clinical Impression: Patient is a 68 year old male with PMHx of history of HTN, HLD, and arthritis. He underwent a left total hip arthroplasty revision on 01/09/24 and a subsequent revision on January 21, 2024, following a dislocation due to a hardware removal, proximal femur replacement, and soft tissue advancement. His incisions continued to demonstrate poor wound healing with chronic clear drainage, leading to his admission to [facility] on 7/28  for closed reduction, irrigation, and debridement of the left hip arthroplasty with wound vac placement by Dr. Edna. Postoperative complications included hypoxemia, anemia, and vomiting. A CT angiogram ruled out a pulmonary embolism. Preoperatively, his  hemoglobin was 9.5 and fell to 5.5 postoperatively, for which he received 2 units of packed red blood cells and 1 unit of FFP. Aspirin  was discontinued. GI was consulted and did not suspect a GI bleed and recommended supportive care with Protonix , it was felt that the anemia was likely secondary to bleeding at the surgical site in the setting of a supratherapeutic INR. A CT abdomen pelvis showed an incidental finding of a 3.7 x 2.2 cm heterogeneously enhancing lesion in the posterior right lobe of the liver, with further care through excision with MRI recommended. The MRI of the abdomen showed multiple liver cysts with the right hepatic lobe lesion having signal characteristics most consistent with a hemangioma. The patient received multiple doses of vitamin K  due to an INR of around 7.1. Pt reports L shoulder weakness since his injury. Oncology was consulted and agreed with the management. Prior to admission patient was Mod I with RW and independent with ADLs living in mobile home with 6 steps to enter. He currently requires assistance with ADLs with mod-min assist for mobility with device.   Patient transferred to CIR on 02/27/2024 .   Patient currently requires min with mobility secondary to muscle weakness, decreased cardiorespiratoy endurance, and decreased sitting balance, decreased standing balance, decreased balance strategies, and difficulty maintaining precautions.  Prior to hospitalization, patient was modified independent  with mobility and lived with Other (Comment) (Sister, Devere) in a Mobile home home.  Home access is 6Stairs to enter.  Patient will benefit from skilled PT intervention to maximize safe functional mobility, minimize fall risk, and decrease caregiver burden for planned discharge home with intermittent assist.  Anticipate patient will benefit from follow up OP at discharge.  PT - End of Session Activity Tolerance: Tolerates 30+ min activity with multiple rests Endurance Deficit:  Yes PT Assessment Rehab Potential (ACUTE/IP ONLY): Good PT Barriers to Discharge: Home environment access/layout PT Patient demonstrates impairments in the following area(s): Balance;Endurance;Motor;Pain;Safety;Skin Integrity PT Transfers Functional Problem(s): Bed Mobility;Bed to Chair;Car;Furniture PT Locomotion Functional Problem(s): Ambulation;Stairs PT Plan PT Intensity: Minimum of 1-2 x/day ,45 to 90 minutes PT Frequency: 5 out of 7 days PT Duration Estimated Length of Stay: 7-10 Days PT Treatment/Interventions: Ambulation/gait training;Community reintegration;DME/adaptive equipment instruction;Neuromuscular re-education;Psychosocial support;UE/LE Strength taining/ROM;Stair training;Balance/vestibular training;Discharge planning;Functional electrical stimulation;Pain management;Skin care/wound management;Therapeutic Activities;UE/LE Coordination activities;Functional mobility training;Patient/family education;Therapeutic Exercise PT Transfers Anticipated Outcome(s): Supervision PT Locomotion Anticipated Outcome(s): Supervision PT Recommendation Recommendations for Other Services: Therapeutic Recreation consult Therapeutic Recreation Interventions: Other (comment) (Dance) Follow Up Recommendations: Outpatient PT Patient destination: Home Equipment Recommended: To be determined   PT Evaluation Precautions/Restrictions Precautions Precautions: Posterior Hip Recall of Precautions/Restrictions: Intact Precaution/Restrictions Comments: Able to recall precautions, difficulty with implementation. Wound vac. Required Braces or Orthoses: Other Brace Other Brace: Abdcutor brace Restrictions Weight Bearing Restrictions Per Provider Order: Yes LLE Weight Bearing Per Provider Order: Weight bearing as tolerated Other Position/Activity Restrictions: With brace in place. General Chart Reviewed: Yes Family/Caregiver Present: No  Pain Interference Pain Interference Pain Effect on Sleep: 1.  Rarely or not at all Pain Interference with Therapy Activities: 1. Rarely or not at all Pain Interference with Day-to-Day Activities: 1. Rarely or not at all Home Living/Prior Functioning Home Living Available Help at Discharge: Family;Available 24 hours/day Type of Home: Mobile home Home Access: Stairs  to enter Entrance Stairs-Number of Steps: 6 Entrance Stairs-Rails: Right;Left;Can reach both Home Layout: One level Bathroom Shower/Tub: Tub/shower unit (TTB) Bathroom Toilet: Handicapped height Bathroom Accessibility: Yes  Lives With: Other (Comment) (Sister, Devere) Prior Function Level of Independence: Requires assistive device for independence;Independent with basic ADLs;Independent with homemaking with ambulation;Independent with gait;Independent with transfers Driving: Yes Vocation: Retired Vision/Perception  Vision - History Ability to See in Adequate Light: 1 Impaired Perception Perception: Within Functional Limits Praxis Praxis: WFL  Cognition Overall Cognitive Status: Within Functional Limits for tasks assessed Arousal/Alertness: Awake/alert Awareness: Appears intact Problem Solving: Appears intact Safety/Judgment: Appears intact Sensation Sensation Light Touch: Appears Intact Hot/Cold: Appears Intact Coordination Gross Motor Movements are Fluid and Coordinated: No Fine Motor Movements are Fluid and Coordinated: Yes Motor  Motor Motor: Within Functional Limits  Trunk/Postural Assessment  Cervical Assessment Cervical Assessment: Within Functional Limits Thoracic Assessment Thoracic Assessment: Within Functional Limits Lumbar Assessment Lumbar Assessment: Within Functional Limits Postural Control Postural Control: Deficits on evaluation Righting Reactions: Decreased/delayed Protective Responses: Decreased/delayed  Balance Balance Balance Assessed: Yes Static Sitting Balance Static Sitting - Balance Support: Feet supported Static Sitting - Level of  Assistance: 7: Independent Static Standing Balance Static Standing - Balance Support: Bilateral upper extremity supported Static Standing - Level of Assistance: 5: Stand by assistance (CGA) Dynamic Standing Balance Dynamic Standing - Balance Support: During functional activity;Bilateral upper extremity supported Dynamic Standing - Level of Assistance: 5: Stand by assistance;4: Min assist (CGA-Min A) Extremity Assessment  RLE Assessment RLE Assessment: Within Functional Limits LLE Assessment LLE Assessment: Exceptions to [facility] General Strength Comments: Not formally tested but grossly 3/5 per functional assessment  Care Tool Care Tool Bed Mobility Roll left and right activity   Roll left and right assist level: Minimal Assistance - Patient > 75%    Sit to lying activity   Sit to lying assist level: Minimal Assistance - Patient > 75%    Lying to sitting on side of bed activity   Lying to sitting on side of bed assist level: the ability to move from lying on the back to sitting on the side of the bed with no back support.: Minimal Assistance - Patient > 75%     Care Tool Transfers Sit to stand transfer   Sit to stand assist level: Minimal Assistance - Patient > 75%    Chair/bed transfer   Chair/bed transfer assist level: Minimal Assistance - Patient > 75%    Car transfer   Car transfer assist level: Minimal Assistance - Patient > 75%      Care Tool Locomotion Ambulation   Assist level: Contact Guard/Touching assist Assistive device: Walker-rolling Max distance: 160'  Walk 10 feet activity   Assist level: Contact Guard/Touching assist Assistive device: Walker-rolling   Walk 50 feet with 2 turns activity   Assist level: Contact Guard/Touching assist Assistive device: Walker-rolling  Walk 150 feet activity   Assist level: Contact Guard/Touching assist Assistive device: Walker-rolling  Walk 10 feet on uneven surfaces activity   Assist level: Contact Guard/Touching  assist Assistive device: Walker-rolling  Stairs   Assist level: Contact Guard/Touching assist Stairs assistive device: 2 hand rails Max number of stairs: 8  Walk up/down 1 step activity   Walk up/down 1 step (curb) assist level: Contact Guard/Touching assist Walk up/down 1 step or curb assistive device: 2 hand rails  Walk up/down 4 steps activity   Walk up/down 4 steps assist level: Contact Guard/Touching assist Walk up/down 4 steps assistive device: 2 hand rails  Walk  up/down 12 steps activity Walk up/down 12 steps activity did not occur: Safety/medical concerns      Pick up small objects from floor Pick up small object from the floor (from standing position) activity did not occur:  (posterior hip precautions) Pick up small object from the floor assist level: Dependent - Patient 0%    Wheelchair Is the patient using a wheelchair?: Yes Type of Wheelchair: Manual   Wheelchair assist level: Dependent - Patient 0% Max wheelchair distance: 150'  Wheel 50 feet with 2 turns activity   Assist Level: Dependent - Patient 0%  Wheel 150 feet activity   Assist Level: Dependent - Patient 0%    Refer to Care Plan for Long Term Goals  SHORT TERM GOAL WEEK 1 PT Short Term Goal 1 (Week 1): STGs = LTGs  Recommendations for other services: Therapeutic Recreation  Other dance  Skilled Therapeutic Intervention  1st Session: Evaluation completed (see details above and below) with education on PT POC and goals and individual treatment initiated with focus on bed mobility, balance, transfers, ambulation, car transfers, and stair training. Pt received supine in bed and agrees to therapy. Reports 5/10 pain in LLE. PT provides rest breaks and gentle mobility to manage pain. PT provides re-ed on posterior hip precautions, though pt does good job of recalling primary 3 precautions, with PT only adding that pivoting on LLE is prohibited. Pt then performs supine to sit with minA and cues for sequencing and  positioning at EOB. Pt is noted to have been incontinent of bowel, so pt performs sit to stand with RW and minA, with cues for sequencing and body mechanics, then ambulates to toilet with CGA and RW, with cues for safe AD management and positioning to transfer onto elevated toilet seat. Pt states that elevated toilet seated is too small for him so pt stands and transitions to regular height toilet with additional education on not exceeding 90 degrees hip flexion on LLE. Following toileting, pt stands from toilet with minA and PT provides pericare for thoroughness and hygiene, then pt ambulates to WC. PT assists to don hip abduction brace. Pt left seated in WC with alarm intact and all needs within reach.   2nd Session: Pt received supine in bed and agrees to therapy. Reports pain in LLE. Number not provided. PT provides rest breaks and gentle mobility to manage pain. Supine to sit with minA and cues for positioning and maintaining posterior hip precautions. Pt performs stand step transfer to [facility] with minA and cues for hand placement and body mechanics. WC transport to gym for time management. Pt completes ramp navigation with CGA and car transfer with minA and cues for sequencing. Seated rest break. Pt ambulates x160' with RW and CGA, with cues for upright gaze to improve posture and balance, increasing proximity to RW for safety, and decreasing WB through RW for energy conservation and improved body mechanics. Following rest break, pt completes x8 6 steps with bilateral handrails and PT demonstration prior to performing, with CGA and cues for sequencing. Seated rest break. Pt then completes Nustep for strengthening and endurance training. Pt completes x10:00 at workload of 4 with average steps per minute ~35. PT provides cues for hand and foot placement and completing full available ROM. Stand step from Nustep>WC>bed with CGA and cues for positioning. Pt requires minA for LLE management for return to supine. Left  ultrasound tech present to perform exam.   Mobility Bed Mobility Bed Mobility: Supine to Sit;Sit to Supine Supine  to Sit: Minimal Assistance - Patient > 75% Sit to Supine: Minimal Assistance - Patient > 75% Transfers Transfers: Sit to Stand;Stand to Sit;Stand Pivot Transfers Sit to Stand: Minimal Assistance - Patient > 75% Stand to Sit: Minimal Assistance - Patient > 75% Stand Pivot Transfers: Minimal Assistance - Patient > 75% Stand Pivot Transfer Details: Verbal cues for precautions/safety;Verbal cues for technique;Verbal cues for sequencing;Tactile cues for posture;Tactile cues for sequencing Transfer (Assistive device): Rolling walker Locomotion  Gait Ambulation: Yes Gait Assistance: Contact Guard/Touching assist Gait Distance (Feet): 160 Feet Assistive device: Rolling walker Gait Gait: Yes Gait Pattern: Impaired Gait Pattern: Trunk flexed;Decreased stance time - left;Decreased stride length Gait velocity: decreased Stairs / Additional Locomotion Stairs: Yes Stairs Assistance: Contact Guard/Touching assist Stair Management Technique: Two rails Number of Stairs: 8 Height of Stairs: 6 Ramp: Contact Guard/touching assist Curb: Contact Guard/Touching assist Wheelchair Mobility Wheelchair Mobility: No   Discharge Criteria: Patient will be discharged from PT if patient refuses treatment 3 consecutive times without medical reason, if treatment goals not met, if there is a change in medical status, if patient makes no progress towards goals or if patient is discharged from hospital.  The above assessment, treatment plan, treatment alternatives and goals were discussed and mutually agreed upon: by patient  Elsie JAYSON Dawn, PT, DPT 02/28/2024, 4:36 PM

## 2024-02-28 NOTE — Progress Notes (Signed)
 RN requested protonix  IV push to be changed to PO;provider gave verbal order to change, Pharmacy called to change.

## 2024-02-28 NOTE — Progress Notes (Signed)
 PROGRESS NOTE   Subjective/Complaints: No acute complaints.  No events overnight.  Patient states he slept well, is preparing for therapies this a.m.  A.m. labs stable, vital stable.  Significant for ongoing hyponatremia, mild, albumin  1.8.  Hemoglobin stable 7-8.  Left shoulder x-ray overnight showed moderate OA left AC joint  ROS: Denies fevers, chills, N/V, abdominal pain, constipation, diarrhea, SOB, cough, chest pain, new weakness or paraesthesias.   + Left proximal thigh pain  Objective:   DG Shoulder Left Result Date: 02/27/2024 CLINICAL DATA:  [id number] Weakness 141835 EXAM: LEFT SHOULDER - 2+ VIEW COMPARISON:  None Available. FINDINGS: No acute fracture or dislocation. Moderate joint space loss of the AC joint. Soft tissues are unremarkable. IMPRESSION: 1. No acute fracture or dislocation. 2. 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 Electronically Signed   By: Rogelia Myers M.D.   On: 02/27/2024 19:26   Recent Labs    02/27/24 1046 02/28/24 0458  WBC 8.6 9.2  HGB 8.8* 8.0*  HCT 28.6* 25.0*  PLT 408* 397   Recent Labs    02/27/24 0340 02/28/24 0458  NA 133* 134*  K 3.8 3.8  CL 97* 98  CO2 25 28  GLUCOSE 94 96  BUN <5* <5*  CREATININE 0.45* 0.64  CALCIUM  8.0* 8.0*    Intake/Output Summary (Last 24 hours) at 02/28/2024 0932 Last data filed at 02/28/2024 9277 Gross per 24 hour  Intake 177 ml  Output 625 ml  Net -448 ml        Physical Exam: Vital Signs Blood pressure 127/66, pulse 82, temperature 98 F (36.7 C), resp. rate 18, height 5' 8 (1.727 m), weight 94.2 kg, SpO2 100%.  General: No apparent distress.  Sitting up at bedside. HEENT: Head is normocephalic, atraumatic, sclera anicteric, oral mucosa dry,  dentition decreased, wearing glasses Neck: Supple without JVD or lymphadenopathy Heart: Reg rate and rhythm. No murmurs rubs or gallops Chest: CTA bilaterally without wheezes, rales, or  rhonchi; no distress Abdomen: Soft, non-tender, mildly-distended, bowel sounds positive but decreased Extremities:  L hip wound vac-prevena- no drainage in canister; 1+ edema throughout left hip and thigh  Psych: Pt's affect is appropriate. Pt is cooperative Skin: No breakdown noted on visible portion Neuro:     Mental Status: AAOx3 Speech/Languate: Fluent, follows simple commands CRANIAL NERVES: 2-12 grossly intact     MOTOR: RUE: 5/5 Deltoid, 5/5 Biceps, 5/5 Triceps,5/5 Grip LUE: 2-3/5 Deltoid, 5/5 Biceps, 5/5 Triceps, 5/5 Grip RLE: HF 5/5, KE 5/5, ADF 5/5, APF 5/5 LLE: HF 2/5, KE 4/5, ADF 5/5, APF 5/5   SENSORY: Normal to touch all 4 extremities   Coordination: No ataxia or dysmetria noted  MSK: No significant TTP left shoulder, no pain with passive range of motion   Assessment/Plan: 1. Functional deficits which require 3+ hours per day of interdisciplinary therapy in a comprehensive inpatient rehab setting. Physiatrist is providing close team supervision and 24 hour management of active medical problems listed below. Physiatrist and rehab team continue to assess barriers to discharge/monitor patient progress toward functional and medical goals  Care Tool:  Bathing              Bathing assist  Upper Body Dressing/Undressing Upper body dressing        Upper body assist      Lower Body Dressing/Undressing Lower body dressing            Lower body assist       Toileting Toileting    Toileting assist Assist for toileting: Moderate Assistance - Patient 50 - 74%     Transfers Chair/bed transfer  Transfers assist     Chair/bed transfer assist level: Moderate Assistance - Patient 50 - 74%     Locomotion Ambulation   Ambulation assist              Walk 10 feet activity   Assist           Walk 50 feet activity   Assist           Walk 150 feet activity   Assist           Walk 10 feet on uneven surface   activity   Assist           Wheelchair     Assist               Wheelchair 50 feet with 2 turns activity    Assist            Wheelchair 150 feet activity     Assist          Blood pressure 127/66, pulse 82, temperature 98 F (36.7 C), resp. rate 18, height 5' 8 (1.727 m), weight 94.2 kg, SpO2 100%.  1. Functional deficits secondary to recurrent dislocation left hip s/p closed reduction and irrigation, debridement.  Hip abduction brace when out of bed, pillow in bed.  WBAT LLE with strict posterior hip precautions             -patient may not shower             -ELOS/Goals: Mod I Pt/Ot 10-12 days             - Stable to continue CIR  2.  Antithrombotics: -DVT/anticoagulation:  Mechanical: Sequential compression devices, below knee Bilateral lower extremities Pharmaceutical: Lovenox   Confirmed initiation with Dr. Leandrew below              -antiplatelet therapy: Aspirin  81 mg held due bleeding risk              - Venous Dopplers ordered--pending  3. Pain Management: Oxycodone , Tylenol  and Robaxin  as needed 4. Mood/Behavior/Sleep: LCSW to follow for evaluation and support when available.              -antipsychotic agents: N/A 5. Neuropsych/cognition: This patient is capable of making decisions on his own behalf. 6. Skin/Wound Care: routine pressure relief measures monitor wound for signs of infection              -Prevena incisional wound VAC changed 8/5 , Continue for 7-10 more days (8/11-13)             -Post op Abx cefadroxil  500 mg BID for 7 days ends 8/13.  7. Fluids/Electrolytes/Nutrition: monitor I/O and daily weights--recheck Labs in a.m.              -Regular diet-encourage fluid intake +Ensure supplement -constipation: 1 week since LBM -bowel program Miralax  and Senokot scheduled, patient  agreeable to enema if unsuccessful today.  - Medium bowel movement 8-7  8.  Acute on chronic anemia: hgb 8.8/28.6  monitor  PT/INR recheck in  a.m  nursing notified to collect daily FOBT  - Continue ferrous sulfate  and folic acid   - 8/7: Hemoglobin stable.  Change FOBT to once  9. HTN:  Amlodipine  and metoprolol . BP stable    02/28/2024    4:59 AM 02/27/2024    8:00 PM 02/27/2024   11:46 AM  Vitals with BMI  Height   5' 8  Weight 207 lbs 11 oz  207 lbs 11 oz  BMI 31.58  31.58  Systolic 127 119   Diastolic 66 66   Pulse 82 95     10.HLD: Atorvastatin   11. Chronic nausea/vomiting: stable -GI has signed off--continue Protonix  40 mg BID   - Transition to p.o. per patient request  - No events since admission rehab  12. L shoulder weakness: pt reports this began around time of hip injury, Will check shoulder xray  - 8-7: X-ray with left AC joint arthritis; no acute fracture--no discomfort on range of motion, monitor    LOS: 1 days A FACE TO FACE EVALUATION WAS PERFORMED  Jonathon Anderson Likes 02/28/2024, 9:32 AM

## 2024-02-28 NOTE — Progress Notes (Signed)
 BLE venous exam is completed. Jonathon Anderson, RVT

## 2024-02-29 DIAGNOSIS — S73005D Unspecified dislocation of left hip, subsequent encounter: Secondary | ICD-10-CM | POA: Diagnosis not present

## 2024-02-29 LAB — OCCULT BLOOD X 1 CARD TO LAB, STOOL: Fecal Occult Bld: NEGATIVE

## 2024-02-29 MED ORDER — TRAMADOL HCL 50 MG PO TABS
50.0000 mg | ORAL_TABLET | Freq: Four times a day (QID) | ORAL | Status: DC | PRN
  Administered 2024-02-29 – 2024-03-07 (×21): 50 mg via ORAL
  Filled 2024-02-29 (×16): qty 1

## 2024-02-29 NOTE — Progress Notes (Signed)
 Occupational Therapy Session Note  Patient Details  Name: Jonathon Anderson MRN: [id number] Date of Birth: 08-14-1955  Today's Date: 02/29/2024 OT Individual Time: 9046-8979 OT Individual Time Calculation (min): 27 min    Short Term Goals: Week 1:  OT Short Term Goal 1 (Week 1): STGs=LTGs due to estimated length of stay.  Skilled Therapeutic Interventions/Progress Updates:    Pt received sitting in the w/c with no c/o pain, agreeable to OT session. Abduction brace on. Pt was taken via w/c to the therapy gym for time management. Pt completed the BUE ergometer to challenge BUE strength and endurance, and L shoulder strengthening/AROM, needed to complete ADLs and IADLs with the highest level of independence. Pt completed 12 min forward with cueing for pacing and technique, one rest break for 1 min at 6 min mark. Switched out his w/c for a taller one to provide increase hip angle to reduce risk of breaking 90 degrees at the L hip. Passed off to PT.   Therapy Documentation Precautions:  Precautions Precautions: Posterior Hip Recall of Precautions/Restrictions: Intact Precaution/Restrictions Comments: Able to recall precautions, difficulty with implementation. Wound vac. Required Braces or Orthoses: Other Brace Other Brace: Abdcutor brace Restrictions Weight Bearing Restrictions Per Provider Order: Yes LLE Weight Bearing Per Provider Order: Weight bearing as tolerated Other Position/Activity Restrictions: With brace in place.  Therapy/Group: Individual Therapy  Jonathon Anderson 02/29/2024, 10:03 AM

## 2024-02-29 NOTE — Progress Notes (Signed)
 Occupational Therapy Session Note  Patient Details  Name: Jonathon Anderson MRN: [id number] Date of Birth: [birth year]  Today's Date: 02/29/2024 OT Individual Time: 0822-0906 OT Individual Time Calculation (min): 44 min   Short Term Goals: Week 1:  OT Short Term Goal 1 (Week 1): STGs=LTGs due to estimated length of stay.  Skilled Therapeutic Interventions/Progress Updates:  Pt greeted resting in bed for skilled OT session with focus on BADL retraining and functional transfers.   Pain: Pt reported 4/10 surgical pain, pre-medicated. OT offering intermediate rest breaks and positioning suggestions throughout session to address pain/fatigue and maximize participation/safety in session.   Functional Transfers: Bed mobility with Min A for LLE management. Multiple sit<>stands from elevated bed with CGA + RW. Min cuing for adherence to posterior hip precautions.   Self Care Tasks: Pt completes the following self care tasks with levels of assistance noted below, UB: Setup/supervision for bathing/dressing.  LB: Pt requires assistance to bathe distal BLE, would benefit from long-handled sponge. Standing pericare with CGA-Min A for standing balance, patient does report increased pain with these activities. OT assisting for thorough cleanse and pain management. Pt dependent for BLE threading and standing hike with increased assistance due to time constraints.  Pt dependent for donning of abduction brace.  Of note, pt with moisture related skin breakdown, nursing assessment completed during session.   Pt remained in care of RN(s) with 4Ps assessed and immediate needs met. Pt continues to be appropriate for skilled OT intervention to promote further functional independence in ADLs/IADLs.   Therapy Documentation Precautions:  Precautions Precautions: Posterior Hip Recall of Precautions/Restrictions: Intact Precaution/Restrictions Comments: Able to recall precautions, difficulty with implementation.  Wound vac. Required Braces or Orthoses: Other Brace Other Brace: Abdcutor brace Restrictions Weight Bearing Restrictions Per Provider Order: No LLE Weight Bearing Per Provider Order: Weight bearing as tolerated Other Position/Activity Restrictions: With brace in place.   Therapy/Group: Individual Therapy  Nereida Habermann, OTR/L, MSOT  02/29/2024, 5:08 AM

## 2024-02-29 NOTE — IPOC Note (Signed)
 Overall Plan of Care [facility]) Patient Details Name: Jonathon Anderson MRN: [id number] DOB: [birth year]  Admitting Diagnosis: Dislocation, hip, left, subsequent encounter  Hospital Problems: Principal Problem:   Dislocation, hip, left, subsequent encounter Active Problems:   Debility     Functional Problem List: Nursing Bladder, Bowel, Edema, Endurance, Medication Management, Pain, Safety, Skin Integrity, Motor  PT Balance, Endurance, Motor, Pain, Safety, Skin Integrity  OT Balance, Endurance, Pain, Safety  SLP    TR         Basic ADL's: OT Bathing, Dressing, Toileting     Advanced  ADL's: OT       Transfers: PT Bed Mobility, Bed to Chair, Car, Occupational psychologist, Research scientist (life sciences): PT Ambulation, Stairs     Additional Impairments: OT None  SLP        TR      Anticipated Outcomes Item Anticipated Outcome  Self Feeding    Swallowing      Basic self-care  Mod I  Toileting  Mod I   Bathroom Transfers Mod I  Bowel/Bladder  manage bowels with medications/ manage bladderr with toileting assistance  Transfers  Supervision  Locomotion  Supervision  Communication     Cognition     Pain  <4 w/ prns  Safety/Judgment  manage safety with mod I assistance   Therapy Plan: PT Intensity: Minimum of 1-2 x/day ,45 to 90 minutes PT Frequency: 5 out of 7 days PT Duration Estimated Length of Stay: 7-10 Days OT Intensity: Minimum of 1-2 x/day, 45 to 90 minutes OT Frequency: 5 out of 7 days OT Duration/Estimated Length of Stay: 7-10 days     Team Interventions: Nursing Interventions Patient/Family Education, Medication Management, Bladder Management, Bowel Management, Disease Management/Prevention, Pain Management, Discharge Planning, Skin Care/Wound Management  PT interventions Ambulation/gait training, Community reintegration, DME/adaptive equipment instruction, Neuromuscular re-education, Psychosocial support, UE/LE Strength taining/ROM, Stair training,  Warden/ranger, Discharge planning, Functional electrical stimulation, Pain management, Skin care/wound management, Therapeutic Activities, UE/LE Coordination activities, Functional mobility training, Patient/family education, Therapeutic Exercise  OT Interventions Balance/vestibular training, Discharge planning, Disease mangement/prevention, DME/adaptive equipment instruction, Functional mobility training, Neuromuscular re-education, Pain management, Patient/family education, Self Care/advanced ADL retraining, Skin care/wound managment, Splinting/orthotics, Therapeutic Activities, Therapeutic Exercise, UE/LE Strength taining/ROM  SLP Interventions    TR Interventions    SW/CM Interventions Discharge Planning, Psychosocial Support, Patient/Family Education   Barriers to Discharge MD  Medical stability, Home enviroment access/loayout, Wound care, Lack of/limited family support, and Insurance for SNF coverage  Nursing Decreased caregiver support, Home environment access/layout Discharge: Mobile home  Discharge Home Layout: One level  Discharge Home Access: Stairs to enter  Entrance Stairs-Rails: Right, Left  Entrance Stairs-Number of Steps: 6  PT Home environment access/layout    OT Wound Care    SLP      SW Decreased caregiver support, Lack of/limited family support, Community education officer for SNF coverage     Team Discharge Planning: Destination: PT-Home ,OT- Home , SLP-  Projected Follow-up: PT-Outpatient PT, OT-  Home health OT, SLP-  Projected Equipment Needs: PT-To be determined, OT- To be determined, SLP-  Equipment Details: PT- , OT-  Patient/family involved in discharge planning: PT- Patient,  OT-Patient, SLP-   MD ELOS: 10-12 days Medical Rehab Prognosis:  Good Assessment: The patient has been admitted for CIR therapies with the diagnosis of R hip dislocation. The team will be addressing functional mobility, strength, stamina, balance, safety, adaptive techniques and equipment,  self-care, bowel and bladder mgt, patient and  caregiver education,. Goals have been set at Mod I PT, OT. Anticipated discharge destination is home.       See Team Conference Notes for weekly updates to the plan of care

## 2024-02-29 NOTE — Progress Notes (Signed)
 Patient ID: KYL GIVLER, male   DOB: 22-Jan-1956, 68 y.o.   MRN: [id number]  [id number] spoke with pt sister Elveria to introduce self, explain role, discuss discharge process, inform on ELOS and discuss fam edu. Reports pt will d/c to home with their sister Devere. States Devere is not able to come in for family edu at this time, but she will come in. SW explained purpose of family edu. Fam edu scheduled for Wednesday 9am-12pm. SW will follow-up with updates after team conference.   Graeme Jude, MSW, LCSW Office: [phone] Cell: [phone] Fax: [phone]

## 2024-02-29 NOTE — Progress Notes (Signed)
 Inpatient Rehabilitation Care Coordinator Assessment and Plan Patient Details  Name: Jonathon Anderson MRN: [id number] Date of Birth: 07/02/1956  Today's Date: 02/29/2024  Hospital Problems: Principal Problem:   Dislocation, hip, left, subsequent encounter Active Problems:   Debility  Past Medical History:  Past Medical History:  Diagnosis Date   Arthritis    Hyperlipidemia    Hypertension    Nicotine dependence    Stroke (HCC)    LIGHT STROKE - NUMBNESS AND TINGLING IN LEFT HAND - 10/2008 APPROX   Past Surgical History:  Past Surgical History:  Procedure Laterality Date   APPLICATION OF WOUND VAC Left 01/09/2024   Procedure: APPLICATION, WOUND VAC;  Surgeon: Edna Toribio LABOR, MD;  Location: [facility];  Service: Orthopedics;  Laterality: Left;   APPLICATION OF WOUND VAC Left 02/18/2024   Procedure: APPLICATION, WOUND VAC;  Surgeon: Edna Toribio LABOR, MD;  Location: [facility];  Service: Orthopedics;  Laterality: Left;   CONVERSION TO TOTAL HIP Left 01/09/2024   Procedure: CONVERSION TO TOTAL HIP;  Surgeon: Edna Toribio LABOR, MD;  Location: [facility];  Service: Orthopedics;  Laterality: Left;   FEMUR IM NAIL Left 07/27/2022   Procedure: REPAIR FEMUR WITH AUTOGRAFT/ RIA;  Surgeon: Celena Sharper, MD;  Location: [facility] OR;  Service: Orthopedics;  Laterality: Left;   HARDWARE REMOVAL Left 06/06/2022   Procedure: HARDWARE REMOVAL;  Surgeon: Celena Sharper, MD;  Location: [facility] OR;  Service: Orthopedics;  Laterality: Left;   HARDWARE REMOVAL Bilateral 07/27/2022   Procedure: REPAIR OF LEFT PROXIMAL FEMUR NONUNION, REMOVAL OF ANTIBIOTIC SPACER, REAMED INTERMEDULLARY ASPRIATE RIGHT FEMUR;  Surgeon: Celena Sharper, MD;  Location: [facility] OR;  Service: Orthopedics;  Laterality: Bilateral;   HIP CLOSED REDUCTION Left 01/19/2024   Procedure: Attempted CLOSED REDUCTION, HIP;  Surgeon: Cristy Bonner DASEN, MD;  Location: [facility] OR;  Service: Orthopedics;  Laterality: Left;   HIP CLOSED REDUCTION  02/18/2024   Procedure:  CLOSED REDUCTION, HIP;  Surgeon: Edna Toribio LABOR, MD;  Location: [facility];  Service: Orthopedics;;   INCISION AND DRAINAGE OF WOUND Left 02/18/2024   Procedure: IRRIGATION AND DEBRIDEMENT WOUND;  Surgeon: Edna Toribio LABOR, MD;  Location: [facility];  Service: Orthopedics;  Laterality: Left;   INTRAMEDULLARY (IM) NAIL INTERTROCHANTERIC Left 05/11/2021   Procedure: INTRAMEDULLARY (IM) NAIL INTERTROCHANTRIC OF LEFT SUBTHROCHANTRIC HIP FRACTURE;  Surgeon: Edie Norleen PARAS, MD;  Location: [facility] ORS;  Service: Orthopedics;  Laterality: Left;   ORIF FEMUR FRACTURE Left 06/06/2022   Procedure: OPEN REDUCTION INTERNAL FIXATION FEMORAL PROXIMAL FRACTURE;  Surgeon: Celena Sharper, MD;  Location: [facility] OR;  Service: Orthopedics;  Laterality: Left;   TOTAL HIP REVISION Left 01/21/2024   Procedure: Open Hip Reduction with Local Tissue Advancement;  Surgeon: Edna Toribio LABOR, MD;  Location: [facility] OR;  Service: Orthopedics;  Laterality: Left;   Social History:  reports that he quit smoking about 20 months ago. His smoking use included cigarettes. He has never used smokeless tobacco. He reports current alcohol  use. He reports that he does not use drugs.  Family / Support Systems Marital Status: Single Spouse/Significant Other: N/A Children: No children Other Supports: Sister and brother Anticipated Caregiver: Pt sister Devere Ability/Limitations of Caregiver: Pt will d/c to home with his sister Devere who will continue to assist him with care needs Caregiver Availability: 24/7 Family Dynamics: Pt lives with his sister Devere  Social History Preferred language: English Religion: Baptist Cultural Background: Pt retired early at 63 due to back issues; worked in [employer] and [employer] totatling 32 years. 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 No Issues since then. Guardian/Conservator: Denies; SW asked his assigned RN to place chalain consult to discuss advanced care directive.   Abuse/Neglect Abuse/Neglect Assessment Can Be Completed: Yes Physical Abuse: Denies Verbal Abuse: Denies Sexual Abuse: Denies Exploitation of patient/patient's resources: Denies Self-Neglect: Denies  Patient response to: Social Isolation - How often do you feel lonely or isolated from those around you?: Never  Emotional Status Pt's affect, behavior and adjustment status: Pt in good spirits at time of visit. Eager to get better. Recent Psychosocial Issues: Denies Psychiatric History: Karna Substance Abuse History: Pt reports that he quit smoking cigarettes one month ago as he realized they were not good for him. He states he quit drinking awhile back as well. Dneies rec  drug use.  Patient / Family Perceptions, Expectations & Goals Pt/Family understanding of illness & functional limitations: Pt and family have a general understanding of care needs Premorbid pt/family roles/activities: Independnet Anticipated changes in roles/activities/participation: Assistance with ADLs/IADLs Pt/family expectations/goals: Pt goal is to work on regaining my strength back.  Community Resources [employer]: None Premorbid Home Care/DME Agencies: None Is the patient able to respond to transportation needs?: Yes In the past 12 months, has lack of transportation kept you from medical appointments or from getting medications?: No In the past 12 months, has lack of transportation kept you from meetings, work, or from getting things needed for daily living?: No Resource referrals recommended: Neuropsychology  Discharge  Planning Living Arrangements: Other relatives Support Systems: Other relatives Type of Residence: Private residence Insurance Resources: [employer] Financial Resources: Restaurant manager, fast food Screen Referred: No Living Expenses: Lives with family Money Management: Patient, Family Does the patient have any problems obtaining your medications?: No Home Management: Pt sister manages all meals. He and his sister both clean the home Patient/Family Preliminary Plans: No changes. Care Coordinator Barriers to Discharge: Decreased caregiver support, Lack of/limited family support, Insurance for SNF coverage Care Coordinator Anticipated Follow Up Needs: HH/OP Expected length of stay: 7-10 days  Clinical Impression SW met with pt in room at bedside. Pt is not a Cytogeneticist. No hCPOA. DME- 3in1 BSC, RW and shower chair with back.   Graeme DELENA Jude 02/29/2024, 2:47 PM

## 2024-02-29 NOTE — Care Management (Signed)
 Inpatient Rehabilitation Center Individual Statement of Services  Patient Name:  Jonathon Anderson  Date:  02/29/2024  Welcome to the Inpatient Rehabilitation Center.  Our goal is to provide you with an individualized program based on your diagnosis and situation, designed to meet your specific needs.  With this comprehensive rehabilitation program, you will be expected to participate in at least 3 hours of rehabilitation therapies Monday-Friday, with modified therapy programming on the weekends.  Your rehabilitation program will include the following services:  Physical Therapy (PT), Occupational Therapy (OT), 24 hour per day rehabilitation nursing, Therapeutic Recreaction (TR), Psychology, Neuropsychology, Care Coordinator, Rehabilitation Medicine, Nutrition Services, Pharmacy Services, and Other  Weekly team conferences will be held on Tuesday to discuss your progress.  Your Inpatient Rehabilitation Care Coordinator will talk with you frequently to get your input and to update you on team discussions.  Team conferences with you and your family in attendance may also be held.  Expected length of stay: 7-10 days  Overall anticipated outcome: Supervision  Depending on your progress and recovery, your program may change. Your Inpatient Rehabilitation Care Coordinator will coordinate services and will keep you informed of any changes. Your Inpatient Rehabilitation Care Coordinator's name and contact numbers are listed  below.  The following services may also be recommended but are not provided by the Inpatient Rehabilitation Center:  Driving Evaluations Home Health Rehabiltiation Services Outpatient Rehabilitation Services Vocational Rehabilitation   Arrangements will be made to provide these services after discharge if needed.  Arrangements include referral to agencies that provide these services.  Your insurance has been verified to be:  Medicare A/B   Your primary doctor is:  Maura  Hamrick  Pertinent information will be shared with your doctor and your insurance company.  Inpatient Rehabilitation Care Coordinator:  Graeme Feliciana SILK [phone] or ([phone]  Information discussed with and copy given to patient by: Graeme DELENA Feliciana, 02/29/2024, 10:34 AM

## 2024-02-29 NOTE — Progress Notes (Signed)
 Physical Therapy Session Note  Patient Details  Name: Jonathon Anderson MRN: [id number] Date of Birth: [birth year]  Today's Date: 02/29/2024 PT Individual Time: 1020-1100 + 1421-1531 PT Individual Time Calculation (min): 40 min + 70 min  Short Term Goals: Week 1:  PT Short Term Goal 1 (Week 1): STGs = LTGs  Skilled Therapeutic Interventions/Progress Updates:    SESSION 1: Pt presents in ortho gym handoff from OT. Pt agreeable to PT. Pt reporting some pain and soreness in L hip this session. Session focused on gait training with RW and therapeutic exercise for BLE strengthening. Pt transported to main gym via [facility], pt ambulates 44' with RW demonstrating increased UE support on RW especially with L stance. Pt takes seated rest break, pt provided with RW with adjusted RW elevated to improve pt upright posture. Pt then ambulates another 90', cues for UE weightbearing during L stance phase. Pt completes sit to stand, pt hip abductor brace readjusted for improved fit. Pt then ambulates another 59' with RW CGA with cues for UE weightbearing with L stance phase with pt demonstrating improved upright posture, demonstrating increased fatigue towards end of ambulation. Pt requesting to cease gait training due to pain and soreness in L hip. Pt completes therapeutic exercise in sitting for BLE strengthening to decrease pain including: - seated marches RLE x10 - LAQs x10 BLE - hamstring curl red band x10 BLE - seated hip extension (stomping) red band x10 BLE Pt returns to room and completes stand step transfer with RW with CGA. Therapist doffs pt hip abduction brace with pt in sitting. Pt completes sit to supine with min assist with pt demonstrating increased difficulty elevating LLE into bed. Pt remains semi reclined with all needs within reach, cal light in place and chair alarm donned and activated at end of session.   SESSION 2: Pt presents in room seated in recliner, agreeable to PT. Pt reporting pain and  soreness in L hip, premedicated. Session focused on NMR for standing balance and BLE muscle fiber recruitment needed for functional transfers, and therapeutic activities for transfer training and activity tolerance. Pt completes stand step transfer with RW with light min assist for sit to stand from recliner. Therapist doffs then redons L hip abduction brace with pt in standing due to poor fit. Pt transported to day orom. Pt completes stand step transfer to EOM with RW with CGA. Pt completes NMR in standing with BUE support on RW for dynamic standing balance and BLE muscle fiber recruitment: - sit to stand x10 - marching x10 BLE - hamstring curl x10 BLE - heel raise x10 - mini squats x10 Pt ambulates forward/backward 3x10' with RW CGA to promote multidirectional stepping stability. Pt provided with extended seated rest breaks between all gait trials and exercises secondary to pain and fatigue to promote energy conservation and quality with tasks. Pt returns to room and completes stand step transfer with RW with CGA to bed, therapist doffing hip abduction brace in sitting. Pt changes shirt with supervision seated EOB. Pt completes sit to supine with light min assist for RLE management into bed. Pt remains semi reclined in bed with all needs within reach, cal light in place and bed alarm activated at end of session.    Therapy Documentation Precautions:  Precautions Precautions: Posterior Hip Recall of Precautions/Restrictions: Intact Precaution/Restrictions Comments: Able to recall precautions, difficulty with implementation. Wound vac. Required Braces or Orthoses: Other Brace Other Brace: Abdcutor brace Restrictions Weight Bearing Restrictions Per Provider Order: Yes LLE  Weight Bearing Per Provider Order: Weight bearing as tolerated Other Position/Activity Restrictions: With brace in place.    Therapy/Group: Individual Therapy  [name] PT, DPT 02/29/2024, 12:55 PM

## 2024-02-29 NOTE — Progress Notes (Signed)
 PROGRESS NOTE   Subjective/Complaints: No acute complaints.  No events overnight.   Vital stable overnight Reporting 6 out of 10 hip pain; took Tylenol  overnight--well-controlled.  Oxycodone  as associated with feelings of vertigo.  Has been able to tolerate tramadol  well in the past.  ROS: Denies fevers, chills, N/V, abdominal pain, constipation, diarrhea, SOB, cough, chest pain, new weakness or paraesthesias.   + Left proximal thigh pain  Objective:   VAS US  LOWER EXTREMITY VENOUS (DVT) Result Date: 02/28/2024  Lower Venous DVT Study Patient Name:  MURDOCK JELLISON  Date of Exam:   02/28/2024 Medical Rec #: [id number]          Accession #:    [id number] Date of Birth: [birth year]          Patient Gender: M Patient Age:   68 years Exam Location:  [facility] Procedure:      VAS US  LOWER EXTREMITY VENOUS (DVT) Referring Phys: DAPHNE LEAK --------------------------------------------------------------------------------  Indications: Immobility.  Performing Technologist: Elmarie Lindau, RVT  Examination Guidelines: A complete evaluation includes B-mode imaging, spectral Doppler, color Doppler, and power Doppler as needed of all accessible portions of each vessel. Bilateral testing is considered an integral part of a complete examination. Limited examinations for reoccurring indications may be performed as noted. The reflux portion of the exam is performed with the patient in reverse Trendelenburg.  +---------+---------------+---------+-----------+----------+--------------+ RIGHT    CompressibilityPhasicitySpontaneityPropertiesThrombus Aging +---------+---------------+---------+-----------+----------+--------------+ CFV      Full           Yes      Yes                                 +---------+---------------+---------+-----------+----------+--------------+ SFJ      Full                                                         +---------+---------------+---------+-----------+----------+--------------+ FV Prox  Full                                                        +---------+---------------+---------+-----------+----------+--------------+ FV Mid   Full                                                        +---------+---------------+---------+-----------+----------+--------------+ FV DistalFull                                                        +---------+---------------+---------+-----------+----------+--------------+ PFV  Full                                                        +---------+---------------+---------+-----------+----------+--------------+ POP      Full           Yes      Yes                                 +---------+---------------+---------+-----------+----------+--------------+ PTV      Full                                                        +---------+---------------+---------+-----------+----------+--------------+ PERO     Full                                                        +---------+---------------+---------+-----------+----------+--------------+   +---------+---------------+---------+-----------+----------+--------------+ LEFT     CompressibilityPhasicitySpontaneityPropertiesThrombus Aging +---------+---------------+---------+-----------+----------+--------------+ CFV      Full           Yes      Yes                                 +---------+---------------+---------+-----------+----------+--------------+ SFJ      Full                                                        +---------+---------------+---------+-----------+----------+--------------+ FV Prox  Full                                                        +---------+---------------+---------+-----------+----------+--------------+ FV Mid   Full                                                         +---------+---------------+---------+-----------+----------+--------------+ FV DistalFull                                                        +---------+---------------+---------+-----------+----------+--------------+ PFV      Full                                                        +---------+---------------+---------+-----------+----------+--------------+  POP      Full           Yes      Yes                                 +---------+---------------+---------+-----------+----------+--------------+ PTV      Full                                                        +---------+---------------+---------+-----------+----------+--------------+ PERO     Full                                                        +---------+---------------+---------+-----------+----------+--------------+     Summary: RIGHT: - There is no evidence of deep vein thrombosis in the lower extremity.  - No cystic structure found in the popliteal fossa.  LEFT: - There is no evidence of deep vein thrombosis in the lower extremity.  - No cystic structure found in the popliteal fossa.  *See table(s) above for measurements and observations. Electronically signed by Debby Robertson on 02/28/2024 at 5:15:47 PM.    Final    DG Shoulder Left Result Date: 02/27/2024 CLINICAL DATA:  141835 Weakness 141835 EXAM: LEFT SHOULDER - 2+ VIEW COMPARISON:  None Available. FINDINGS: No acute fracture or dislocation. Moderate joint space loss of the AC joint. Soft tissues are unremarkable. IMPRESSION: 1. No acute fracture or dislocation. 2. 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 Electronically Signed   By: Rogelia Myers M.D.   On: 02/27/2024 19:26   Recent Labs    02/27/24 1046 02/28/24 0458  WBC 8.6 9.2  HGB 8.8* 8.0*  HCT 28.6* 25.0*  PLT 408* 397   Recent Labs    02/27/24 0340 02/28/24 0458  NA 133* 134*  K 3.8 3.8  CL 97* 98  CO2 25 28  GLUCOSE 94 96  BUN <5* <5*  CREATININE 0.45* 0.64   CALCIUM  8.0* 8.0*    Intake/Output Summary (Last 24 hours) at 02/29/2024 0937 Last data filed at 02/29/2024 0730 Gross per 24 hour  Intake 340 ml  Output 250 ml  Net 90 ml        Physical Exam: Vital Signs Blood pressure 115/65, pulse 77, temperature 98.3 F (36.8 C), resp. rate 18, height 5' 8 (1.727 m), weight 94.2 kg, SpO2 99%.  General: No apparent distress.  Sitting up at bedside. HEENT: Head is normocephalic, atraumatic, sclera anicteric, oral mucosa dry,  dentition decreased, wearing glasses Neck: Supple without JVD or lymphadenopathy Heart: Reg rate and rhythm. No murmurs rubs or gallops Chest: CTA bilaterally without wheezes, rales, or rhonchi; no distress Abdomen: Soft, non-tender, mildly-distended, bowel sounds positive but decreased Extremities:  L hip wound vac-prevena- no drainage in canister; 1+ edema throughout left hip and thigh  Psych: Pt's affect is appropriate. Pt is cooperative Skin: No breakdown noted on visible portion Neuro:     Mental Status: AAOx3 Speech/Languate: Fluent, follows simple commands CRANIAL NERVES: 2-12 grossly intact     MOTOR: RUE: 5/5 Deltoid, 5/5 Biceps, 5/5 Triceps,5/5 Grip LUE: 2-3/5 Deltoid, 5/5 Biceps, 5/5 Triceps, 5/5  Grip RLE: HF 5/5, KE 5/5, ADF 5/5, APF 5/5 LLE: HF 2/5, KE 4/5, ADF 5/5, APF 5/5   SENSORY: Normal to touch all 4 extremities   Coordination: No ataxia or dysmetria noted  MSK: No significant TTP left shoulder, no pain with passive range of motion   Physical exam unchanged from the above on reexamination 02/29/24    Assessment/Plan: 1. Functional deficits which require 3+ hours per day of interdisciplinary therapy in a comprehensive inpatient rehab setting. Physiatrist is providing close team supervision and 24 hour management of active medical problems listed below. Physiatrist and rehab team continue to assess barriers to discharge/monitor patient progress toward functional and medical goals  Care  Tool:  Bathing    Body parts bathed by patient: Right arm, Left arm, Chest, Abdomen, Front perineal area, Buttocks, Right upper leg, Left upper leg, Face   Body parts bathed by helper: Right lower leg, Left lower leg     Bathing assist Assist Level: Minimal Assistance - Patient > 75%     Upper Body Dressing/Undressing Upper body dressing   What is the patient wearing?: Hospital gown only    Upper body assist Assist Level: Set up assist    Lower Body Dressing/Undressing Lower body dressing      What is the patient wearing?: Hospital gown only     Lower body assist Assist for lower body dressing: Set up assist     Toileting Toileting    Toileting assist Assist for toileting: Minimal Assistance - Patient > 75%     Transfers Chair/bed transfer  Transfers assist     Chair/bed transfer assist level: Minimal Assistance - Patient > 75%     Locomotion Ambulation   Ambulation assist      Assist level: Contact Guard/Touching assist Assistive device: Walker-rolling Max distance: 160'   Walk 10 feet activity   Assist     Assist level: Contact Guard/Touching assist Assistive device: Walker-rolling   Walk 50 feet activity   Assist    Assist level: Contact Guard/Touching assist Assistive device: Walker-rolling    Walk 150 feet activity   Assist    Assist level: Contact Guard/Touching assist Assistive device: Walker-rolling    Walk 10 feet on uneven surface  activity   Assist     Assist level: Contact Guard/Touching assist Assistive device: Walker-rolling   Wheelchair     Assist Is the patient using a wheelchair?: Yes Type of Wheelchair: Manual    Wheelchair assist level: Dependent - Patient 0% Max wheelchair distance: 150'    Wheelchair 50 feet with 2 turns activity    Assist        Assist Level: Dependent - Patient 0%   Wheelchair 150 feet activity     Assist      Assist Level: Dependent - Patient 0%    Blood pressure 115/65, pulse 77, temperature 98.3 F (36.8 C), resp. rate 18, height 5' 8 (1.727 m), weight 94.2 kg, SpO2 99%.  1. Functional deficits secondary to recurrent dislocation left hip s/p closed reduction and irrigation, debridement.  Hip abduction brace when out of bed, pillow in bed.  WBAT LLE with strict posterior hip precautions             -patient may not shower             -ELOS/Goals: Mod I Pt/Ot 10-12 days             - Stable to continue CIR  2.  Antithrombotics: -DVT/anticoagulation:  Mechanical: Sequential compression devices, below knee Bilateral lower extremities Pharmaceutical: Lovenox   Confirmed initiation with Dr. Leandrew below              -antiplatelet therapy: Aspirin  81 mg held due bleeding risk              - Venous Dopplers ordered--negative  3. Pain Management: Oxycodone , Tylenol  and Robaxin  as needed  - Pain well-controlled on current regimen  - 8-8: Switch as needed oxycodone  to tramadol  2 mg every 6 hours as needed per patient request  4. Mood/Behavior/Sleep: LCSW to follow for evaluation and support when available.              -antipsychotic agents: N/A 5. Neuropsych/cognition: This patient is capable of making decisions on his own behalf. 6. Skin/Wound Care: routine pressure relief measures monitor wound for signs of infection              -Prevena incisional wound VAC changed 8/5 , Continue for 7-10 more days (8/11-13)--minimal output             -Post op Abx cefadroxil  500 mg BID for 7 days ends 8/13.  7. Fluids/Electrolytes/Nutrition: monitor I/O and daily weights--recheck Labs in a.m.              -Regular diet-encourage fluid intake +Ensure supplement -constipation: 1 week since LBM -bowel program Miralax  and Senokot scheduled, patient  agreeable to enema if unsuccessful today.  - Medium bowel movement 8-7  8.  Acute on chronic anemia: hgb 8.8/28.6  monitor  PT/INR recheck in a.m  nursing notified to collect daily FOBT  - Continue  ferrous sulfate  and folic acid   - 8/7: Hemoglobin stable.  Change FOBT to once--pending  9. HTN:  Amlodipine  and metoprolol . BP stable    02/29/2024    5:19 AM 02/28/2024    7:38 PM 02/28/2024   12:56 PM  Vitals with BMI  Systolic 115 105 894  Diastolic 65 63 60  Pulse 77 86 77    10.HLD: Atorvastatin   11. Chronic nausea/vomiting: stable -GI has signed off--continue Protonix  40 mg BID   - Transition to p.o. per patient request  - No events since admission rehab  12. L shoulder weakness: pt reports this began around time of hip injury, Will check shoulder xray  - 8-7: X-ray with left AC joint arthritis; no acute fracture--no discomfort on range of motion, monitor    LOS: 2 days A FACE TO FACE EVALUATION WAS PERFORMED  Joesph JAYSON Likes 02/29/2024, 9:37 AM

## 2024-02-29 NOTE — Plan of Care (Signed)
  Problem: Consults Goal: RH GENERAL PATIENT EDUCATION Description: See Patient Education module for education specifics. Outcome: Progressing   Problem: RH BOWEL ELIMINATION Goal: RH STG MANAGE BOWEL WITH ASSISTANCE Description: STG Manage Bowel with  mod I Assistance. Outcome: Progressing   Problem: RH BLADDER ELIMINATION Goal: RH STG MANAGE BLADDER WITH ASSISTANCE Description: STG Manage Bladder With mod I Assistance Outcome: Progressing   Problem: RH SKIN INTEGRITY Goal: RH STG SKIN FREE OF INFECTION/BREAKDOWN Description: Manage ski breakdown with mod I assistance Outcome: Progressing   Problem: RH SAFETY Goal: RH STG ADHERE TO SAFETY PRECAUTIONS W/ASSISTANCE/DEVICE Description: STG Adhere to Safety Precautions With mod I Assistance/Device. Outcome: Progressing   Problem: RH PAIN MANAGEMENT Goal: RH STG PAIN MANAGED AT OR BELOW PT'S PAIN GOAL Description: <4 w/ prns Outcome: Progressing   Problem: RH KNOWLEDGE DEFICIT GENERAL Goal: RH STG INCREASE KNOWLEDGE OF SELF CARE AFTER HOSPITALIZATION Description: Manage increase knowledge of self care after hospitalization with mod I assistance from sister using educational materials provided Outcome: Progressing

## 2024-02-29 NOTE — Progress Notes (Signed)
   02/29/24 1338  Spiritual Encounters  Type of Visit Initial  Care provided to: Patient  Reason for visit Advance directives  OnCall Visit No   Chaplain provided AD documentation and education to Pt and then provided emotional care and support to the Pt. Chaplain advised Pt to contact Chaplain when Pt is ready to have completed AD notarized. No additional spiritual need at this time.  Chaplain Therisa Samuel

## 2024-03-01 DIAGNOSIS — S73005D Unspecified dislocation of left hip, subsequent encounter: Secondary | ICD-10-CM | POA: Diagnosis not present

## 2024-03-01 DIAGNOSIS — D649 Anemia, unspecified: Secondary | ICD-10-CM | POA: Diagnosis not present

## 2024-03-01 DIAGNOSIS — I10 Essential (primary) hypertension: Secondary | ICD-10-CM | POA: Diagnosis not present

## 2024-03-01 DIAGNOSIS — K5900 Constipation, unspecified: Secondary | ICD-10-CM | POA: Diagnosis not present

## 2024-03-01 NOTE — Progress Notes (Signed)
 PROGRESS NOTE   Subjective/Complaints: No acute complaints or concerns this morning.  Reports he slept okay.  LBM today.  Pain is under control.  ROS: Denies insomnia, N/V, abdominal pain, constipation, diarrhea, SOB, cough, chest pain, new weakness or paraesthesias.   + Left proximal thigh pain  Objective:   VAS US  LOWER EXTREMITY VENOUS (DVT) Result Date: 02/28/2024  Lower Venous DVT Study Patient Name:  Jonathon Anderson  Date of Exam:   02/28/2024 Medical Rec #: [id number]          Accession #:    [id number] Date of Birth: [birth year]          Patient Gender: M Patient Age:   68 years Exam Location:  [facility] Procedure:      VAS US  LOWER EXTREMITY VENOUS (DVT) Referring Phys: DAPHNE LEAK --------------------------------------------------------------------------------  Indications: Immobility.  Performing Technologist: Elmarie Lindau, RVT  Examination Guidelines: A complete evaluation includes B-mode imaging, spectral Doppler, color Doppler, and power Doppler as needed of all accessible portions of each vessel. Bilateral testing is considered an integral part of a complete examination. Limited examinations for reoccurring indications may be performed as noted. The reflux portion of the exam is performed with the patient in reverse Trendelenburg.  +---------+---------------+---------+-----------+----------+--------------+ RIGHT    CompressibilityPhasicitySpontaneityPropertiesThrombus Aging +---------+---------------+---------+-----------+----------+--------------+ CFV      Full           Yes      Yes                                 +---------+---------------+---------+-----------+----------+--------------+ SFJ      Full                                                        +---------+---------------+---------+-----------+----------+--------------+ FV Prox  Full                                                         +---------+---------------+---------+-----------+----------+--------------+ FV Mid   Full                                                        +---------+---------------+---------+-----------+----------+--------------+ FV DistalFull                                                        +---------+---------------+---------+-----------+----------+--------------+ PFV      Full                                                        +---------+---------------+---------+-----------+----------+--------------+  POP      Full           Yes      Yes                                 +---------+---------------+---------+-----------+----------+--------------+ PTV      Full                                                        +---------+---------------+---------+-----------+----------+--------------+ PERO     Full                                                        +---------+---------------+---------+-----------+----------+--------------+   +---------+---------------+---------+-----------+----------+--------------+ LEFT     CompressibilityPhasicitySpontaneityPropertiesThrombus Aging +---------+---------------+---------+-----------+----------+--------------+ CFV      Full           Yes      Yes                                 +---------+---------------+---------+-----------+----------+--------------+ SFJ      Full                                                        +---------+---------------+---------+-----------+----------+--------------+ FV Prox  Full                                                        +---------+---------------+---------+-----------+----------+--------------+ FV Mid   Full                                                        +---------+---------------+---------+-----------+----------+--------------+ FV DistalFull                                                         +---------+---------------+---------+-----------+----------+--------------+ PFV      Full                                                        +---------+---------------+---------+-----------+----------+--------------+ POP      Full           Yes      Yes                                 +---------+---------------+---------+-----------+----------+--------------+  PTV      Full                                                        +---------+---------------+---------+-----------+----------+--------------+ PERO     Full                                                        +---------+---------------+---------+-----------+----------+--------------+     Summary: RIGHT: - There is no evidence of deep vein thrombosis in the lower extremity.  - No cystic structure found in the popliteal fossa.  LEFT: - There is no evidence of deep vein thrombosis in the lower extremity.  - No cystic structure found in the popliteal fossa.  *See table(s) above for measurements and observations. Electronically signed by Debby Robertson on 02/28/2024 at 5:15:47 PM.    Final    Recent Labs    02/28/24 0458  WBC 9.2  HGB 8.0*  HCT 25.0*  PLT 397   Recent Labs    02/28/24 0458  NA 134*  K 3.8  CL 98  CO2 28  GLUCOSE 96  BUN <5*  CREATININE 0.64  CALCIUM  8.0*    Intake/Output Summary (Last 24 hours) at 03/01/2024 1331 Last data filed at 03/01/2024 0600 Gross per 24 hour  Intake 360 ml  Output 575 ml  Net -215 ml        Physical Exam: Vital Signs Blood pressure 113/64, pulse 77, temperature 97.8 F (36.6 C), temperature source Oral, resp. rate 18, height 5' 8 (1.727 m), weight 89.8 kg, SpO2 100%.  General: No apparent distress.  Sitting up at bedside. HEENT: Head is normocephalic, atraumatic, sclera anicteric, oral mucosa dry,  dentition decreased, wearing glasses Neck: Supple without JVD or lymphadenopathy Heart: Reg rate and rhythm. No murmurs rubs or gallops Chest: CTA bilaterally  without wheezes, rales, or rhonchi; no distress Abdomen: Soft, non-tender, mildly-distended, bowel sounds positive but decreased Extremities:  L hip wound vac-prevena- no drainage in canister; 1+ edema throughout left hip and thigh, appropriately tender  Psych: Patient is a very pleasant Skin: No breakdown noted on visible portion Neuro:     Mental Status: AAOx3 Speech/Languate: Fluent, follows simple commands CRANIAL NERVES: 2-12 grossly intact     MOTOR: RUE: 5/5 Deltoid, 5/5 Biceps, 5/5 Triceps,5/5 Grip LUE: 2-3/5 Deltoid, 5/5 Biceps, 5/5 Triceps, 5/5 Grip RLE: HF 5/5, KE 5/5, ADF 5/5, APF 5/5 LLE: HF 2/5, KE 4/5, ADF 5/5, APF 5/5   SENSORY: Normal to touch all 4 extremities   Coordination: No ataxia or dysmetria noted  MSK: No significant TTP left shoulder, no pain with passive range of motion   Physical exam unchanged from the above on reexamination 03/01/24    Assessment/Plan: 1. Functional deficits which require 3+ hours per day of interdisciplinary therapy in a comprehensive inpatient rehab setting. Physiatrist is providing close team supervision and 24 hour management of active medical problems listed below. Physiatrist and rehab team continue to assess barriers to discharge/monitor patient progress toward functional and medical goals  Care Tool:  Bathing    Body parts bathed by patient: Right arm, Left arm, Chest, Abdomen, Front perineal area, Buttocks, Right upper  leg, Left upper leg, Face   Body parts bathed by helper: Right lower leg, Left lower leg     Bathing assist Assist Level: Minimal Assistance - Patient > 75%     Upper Body Dressing/Undressing Upper body dressing   What is the patient wearing?: Hospital gown only    Upper body assist Assist Level: Set up assist    Lower Body Dressing/Undressing Lower body dressing      What is the patient wearing?: Hospital gown only     Lower body assist Assist for lower body dressing: Set up assist      Toileting Toileting    Toileting assist Assist for toileting: Minimal Assistance - Patient > 75%     Transfers Chair/bed transfer  Transfers assist     Chair/bed transfer assist level: Supervision/Verbal cueing     Locomotion Ambulation   Ambulation assist      Assist level: Contact Guard/Touching assist Assistive device: Walker-rolling Max distance: 90'   Walk 10 feet activity   Assist     Assist level: Contact Guard/Touching assist Assistive device: Walker-rolling   Walk 50 feet activity   Assist    Assist level: Contact Guard/Touching assist Assistive device: Walker-rolling    Walk 150 feet activity   Assist    Assist level: Contact Guard/Touching assist Assistive device: Walker-rolling    Walk 10 feet on uneven surface  activity   Assist     Assist level: Contact Guard/Touching assist Assistive device: Walker-rolling   Wheelchair     Assist Is the patient using a wheelchair?: Yes Type of Wheelchair: Manual    Wheelchair assist level: Dependent - Patient 0% Max wheelchair distance: 150'    Wheelchair 50 feet with 2 turns activity    Assist        Assist Level: Dependent - Patient 0%   Wheelchair 150 feet activity     Assist      Assist Level: Dependent - Patient 0%   Blood pressure 113/64, pulse 77, temperature 97.8 F (36.6 C), temperature source Oral, resp. rate 18, height 5' 8 (1.727 m), weight 89.8 kg, SpO2 100%.  1. Functional deficits secondary to recurrent dislocation left hip s/p closed reduction and irrigation, debridement.  Hip abduction brace when out of bed, pillow in bed.  WBAT LLE with strict posterior hip precautions             -patient may not shower             -ELOS/Goals: Mod I Pt/Ot 10-12 days             - Stable to continue CIR  2.  Antithrombotics: -DVT/anticoagulation:  Mechanical: Sequential compression devices, below knee Bilateral lower extremities Pharmaceutical: Lovenox    Confirmed initiation with Dr. Leandrew below              -antiplatelet therapy: Aspirin  81 mg held due bleeding risk              - Venous Dopplers ordered--negative  3. Pain Management: Oxycodone , Tylenol  and Robaxin  as needed  - Pain well-controlled on current regimen  - 8-8: Switch as needed oxycodone  to tramadol  50 mg every 6 hours as needed per patient request  - 8/9 patient used tramadol  twice last night, continue current regimen  4. Mood/Behavior/Sleep: LCSW to follow for evaluation and support when available.              -antipsychotic agents: N/A 5. Neuropsych/cognition: This patient is capable of making decisions  on his own behalf. 6. Skin/Wound Care: routine pressure relief measures monitor wound for signs of infection              -Prevena incisional wound VAC changed 8/5 , Continue for 7-10 more days (8/11-13)--minimal output             -Post op Abx cefadroxil  500 mg BID for 7 days ends 8/13.  7. Fluids/Electrolytes/Nutrition: monitor I/O and daily weights--recheck Labs in a.m.              -Regular diet-encourage fluid intake +Ensure supplement -constipation: 1 week since LBM -bowel program Miralax  and Senokot scheduled, patient  agreeable to enema if unsuccessful today.  - Medium bowel movement 8-7 - 8/9 last bowel movement today  8.  Acute on chronic anemia: hgb 8.8/28.6  monitor  PT/INR recheck in a.m  nursing notified to collect daily FOBT  - Continue ferrous sulfate  and folic acid   - 8/7: Hemoglobin stable.  Change FOBT to once--negative on 8/7  9. HTN:  Amlodipine  and metoprolol . BP stable  -8/9 BP controlled continue to monitor    03/01/2024    5:13 AM 03/01/2024    5:00 AM 02/29/2024    7:55 PM  Vitals with BMI  Weight  198 lbs   BMI  30.11   Systolic 113  108  Diastolic 64  76  Pulse 77  86    10.HLD: Atorvastatin   11. Chronic nausea/vomiting: stable -GI has signed off--continue Protonix  40 mg BID   - Transition to p.o. per patient request  - No  events since admission rehab  12. L shoulder weakness: pt reports this began around time of hip injury, Will check shoulder xray  - 8-7: X-ray with left AC joint arthritis; no acute fracture--no discomfort on range of motion, monitor    LOS: 3 days A FACE TO FACE EVALUATION WAS PERFORMED  Murray Collier 03/01/2024, 1:31 PM

## 2024-03-01 NOTE — Progress Notes (Signed)
 Physical Therapy Session Note  Patient Details  Name: Jonathon Anderson MRN: [id number] Date of Birth: 16-May-1956  Today's Date: 03/01/2024 PT Individual Time: 0805-0900 PT Individual Time Calculation (min): 55 min   Short Term Goals: Week 1:  PT Short Term Goal 1 (Week 1): STGs = LTGs  Skilled Therapeutic Interventions/Progress Updates:     Pt presents in room in bed, RN present providing medication. Pt agreeable to PT, reporting feeling fatigued this morning and slow to initiate movements OOB due to pt reporting waking up in the middle of the night for a bowel movement. Pt reporting pain and soreness in L hip, unrated, reports having gotten pain medication. Session focused on therapeutic activities for participating with self care tasks and energy conservation for ADLs, transfers, and ambulation, as well as gait training for improved gait mechanics with RW.  Pt provided with seated rest breaks between all gait trials and exercises to promote energy conservation and quality with tasks. Pt completes supine to sit with supervision maintaining hip precautions, elevated HOB. Pt completes donning shirt with supervision, requires total assist for shoe management and donning hip abduction brace. Hip abduction brace adjusted throughout due to shifting during transfers and ambulation to ensure best fit. Pt completes transfers with supervision from elevated EOB and WC with RW throughout session, therapist monitoring wound vac. Pt transported to main gym. Pt completes gait with RW 3x95' with CGA/supervision, increased UE weightbearing on RW for L stance phase due to pt demonstrating increased antalgic gait with RW, cues for proximity to RW with fatigue. Pt also demonstrating R ankle inversion in stance phase, pt reports this is baseline and does not cause pain or issue. Pt returns to room and completes stand step transfer to recliner with RW supervision and remains seated in recliner with all needs within reach,  cal light in place and chair alarm activated at end of session.  Therapy Documentation Precautions:  Precautions Precautions: Posterior Hip Recall of Precautions/Restrictions: Intact Precaution/Restrictions Comments: Able to recall precautions, difficulty with implementation. Wound vac. Required Braces or Orthoses: Other Brace Other Brace: Abdcutor brace Restrictions Weight Bearing Restrictions Per Provider Order: Yes LLE Weight Bearing Per Provider Order: Weight bearing as tolerated Other Position/Activity Restrictions: With brace in place.   Therapy/Group: Individual Therapy  [name] PT, DPT 03/01/2024, 9:02 AM

## 2024-03-01 NOTE — Progress Notes (Signed)
 Occupational Therapy Session Note  Patient Details  Name: Jonathon Anderson MRN: [id number] Date of Birth: 03/08/1956  Today's Date: 03/01/2024 OT Individual Time: 0930-1030 OT Individual Time Calculation (min): 60 min    Short Term Goals: Week 1:  OT Short Term Goal 1 (Week 1): STGs=LTGs due to estimated length of stay.  Skilled Therapeutic Interventions/Progress Updates:    Pt greeted seated in recliner and agreeable to OT treatment session. Pt declined participating in BADL tasks stating he had already completed them. OT reviewed posterior hip precautions with pt, then he completed stand-pivot to wc with RW and min A. PT brought to therapy gym and ambulated 10 feet w./  RW and min A. Worked on standing balance/endurance using clothes pins and basketball net. Focus on UE external rotation to reach behind and collect clothes pins without twisting to simulate toileting and clothing management. Pt with OA of L shoulder and difficulty reaching, but able to complete with R UE. Pt then reported urgent need to urinate. Pt brought to restroom and stood with RW and min A with max A for clothing management. Pt then reported bowel incontinence and needing to return to room. Pt brought to room in wc and ambulated 5 feet to raised commode. Max A for clothing management and posterior peri-care with no further BM in commode. Pt ambulated out of bathroom to recliner with RW, Min A and fatigued. Pt left with LE's elevated in recliner, call bell in reach, and needs met.   Therapy Documentation Precautions:  Precautions Precautions: Posterior Hip Recall of Precautions/Restrictions: Intact Precaution/Restrictions Comments: Able to recall precautions, difficulty with implementation. Wound vac. Required Braces or Orthoses: Other Brace Other Brace: Abdcutor brace Restrictions Weight Bearing Restrictions Per Provider Order: Yes LLE Weight Bearing Per Provider Order: Weight bearing as tolerated Other  Position/Activity Restrictions: With brace in place. Pain: Pain Assessment Pain Scale: 0-10 Pain Score: 0-No pain    Therapy/Group: Individual Therapy  Sharyle GORMAN Pert 03/01/2024, 10:27 AM

## 2024-03-01 NOTE — Progress Notes (Signed)
 Physical Therapy Note  Patient Details  Name: Jonathon Anderson MRN: [id number] Date of Birth: [birth year] Today's Date: 03/01/2024   Today's Date: 03/01/2024 PT Missed Time: 45 Minutes Missed Time Reason: Other (Comment) (pt reporting ongoing stomach ache secondary to bowel accident earlier in day)   Pt politely declines therapy due to pain in abdomen and ongoing bowel accidents. Pt provided with education and option to complete therapy in room with pt continuing to decline therapy. Pt remains semi reclined in bed with hip abductor pillow in place, all needs within reach, call light in place, bed alarm activated at end of session.   [name] PT, DPT 03/01/2024, 2:21 PM

## 2024-03-01 NOTE — Plan of Care (Signed)
  Problem: Consults Goal: RH GENERAL PATIENT EDUCATION Description: See Patient Education module for education specifics. Outcome: Progressing   Problem: RH BOWEL ELIMINATION Goal: RH STG MANAGE BOWEL WITH ASSISTANCE Description: STG Manage Bowel with  mod I Assistance. Outcome: Progressing   Problem: RH BLADDER ELIMINATION Goal: RH STG MANAGE BLADDER WITH ASSISTANCE Description: STG Manage Bladder With mod I Assistance Outcome: Progressing   Problem: RH SKIN INTEGRITY Goal: RH STG SKIN FREE OF INFECTION/BREAKDOWN Description: Manage ski breakdown with mod I assistance Outcome: Progressing   Problem: RH SAFETY Goal: RH STG ADHERE TO SAFETY PRECAUTIONS W/ASSISTANCE/DEVICE Description: STG Adhere to Safety Precautions With mod I Assistance/Device. Outcome: Progressing   Problem: RH PAIN MANAGEMENT Goal: RH STG PAIN MANAGED AT OR BELOW PT'S PAIN GOAL Description: <4 w/ prns Outcome: Progressing   Problem: RH KNOWLEDGE DEFICIT GENERAL Goal: RH STG INCREASE KNOWLEDGE OF SELF CARE AFTER HOSPITALIZATION Description: Manage increase knowledge of self care after hospitalization with mod I assistance from sister using educational materials provided Outcome: Progressing

## 2024-03-01 NOTE — Progress Notes (Signed)
 Occupational Therapy Session Note  Patient Details  Name: Jonathon Anderson MRN: [id number] Date of Birth: March 13, 1956  Today's Date: 03/01/2024 OT Individual Time: 0100-0130 OT Individual Time Calculation (min): 30 min    Short Term Goals: Week 1:  OT Short Term Goal 1 (Week 1): STGs=LTGs due to estimated length of stay.  Skilled Therapeutic Interventions/Progress Updates:    Patient in bed at the time of treatment eating lunch. Patient indicated that his lunch arrived late.  The  pt went on to say that he was given a stool softener to address constipation and that he was having issues with diarrhea.  The pt opted to work at bed LOF.  The pt was able to verbalize his posterior hip precautions with vc's.  The pt was MaxA for donning his  hip abductor wedge.   The pt was instructed in UB exercise using the red theraband at bed LOF for shld flexion 2 sets of 15  At the end of the session, the call light and bedside table were placed within reach with  the bed alarm activated.   Therapy Documentation Precautions:  Precautions Precautions: Posterior Hip Recall of Precautions/Restrictions: Intact Precaution/Restrictions Comments: Able to recall precautions, difficulty with implementation. Wound vac. Required Braces or Orthoses: Other Brace Other Brace: Abdcutor brace Restrictions Weight Bearing Restrictions Per Provider Order: Yes LLE Weight Bearing Per Provider Order: Weight bearing as tolerated Other Position/Activity Restrictions: With brace in place.  Therapy/Group: Individual Therapy  Elvera JONETTA Mace 03/01/2024, 4:39 PM

## 2024-03-02 DIAGNOSIS — E871 Hypo-osmolality and hyponatremia: Secondary | ICD-10-CM

## 2024-03-02 DIAGNOSIS — D649 Anemia, unspecified: Secondary | ICD-10-CM | POA: Diagnosis not present

## 2024-03-02 DIAGNOSIS — K5900 Constipation, unspecified: Secondary | ICD-10-CM | POA: Diagnosis not present

## 2024-03-02 DIAGNOSIS — I10 Essential (primary) hypertension: Secondary | ICD-10-CM | POA: Diagnosis not present

## 2024-03-02 DIAGNOSIS — S73005D Unspecified dislocation of left hip, subsequent encounter: Secondary | ICD-10-CM | POA: Diagnosis not present

## 2024-03-02 NOTE — Progress Notes (Signed)
 PROGRESS NOTE   Subjective/Complaints: No new complaints or concerns.  Occasional pain in left leg overall controlled.  Reports he is doing great .  ROS: Denies insomnia, N/V, abdominal pain, constipation, diarrhea, SOB, cough, chest pain, new weakness or paraesthesias.   + Left proximal thigh pain- controlled  Objective:   No results found.  No results for input(s): WBC, HGB, HCT, PLT in the last 72 hours.  No results for input(s): NA, K, CL, CO2, GLUCOSE, BUN, CREATININE, CALCIUM  in the last 72 hours.   Intake/Output Summary (Last 24 hours) at 03/02/2024 1342 Last data filed at 03/02/2024 1305 Gross per 24 hour  Intake 840 ml  Output 1750 ml  Net -910 ml        Physical Exam: Vital Signs Blood pressure 123/69, pulse 79, temperature 97.6 F (36.4 C), resp. rate 16, height 5' 8 (1.727 m), weight 89.4 kg, SpO2 100%.  General: No apparent distress.  Eating lunch in his room HEENT: Head is normocephalic, atraumatic, sclera anicteric, oral mucosa moist,  dentition decreased, wearing glasses Heart:RRR Chest: CTA bilaterally without wheezes, rales, or rhonchi; no distress Abdomen: Soft, non-tender, mildly-distended, bowel sounds positive but decreased Extremities:  L hip wound vac-prevena- no drainage in canister; 1+ edema throughout left hip and thigh, appropriately tender  Psych: Patient is a very pleasant Skin: No breakdown noted on visible portion Neuro:     Mental Status: AAOx3 Speech/Languate: Fluent, follows simple commands CRANIAL NERVES: 2-12 grossly intact     MOTOR: RUE: 5/5 Deltoid, 5/5 Biceps, 5/5 Triceps,5/5 Grip LUE: 2-3/5 Deltoid, 5/5 Biceps, 5/5 Triceps, 5/5 Grip RLE: HF 5/5, KE 5/5, ADF 5/5, APF 5/5 LLE: HF 2/5, KE 4/5, ADF 5/5, APF 5/5   SENSORY: Normal to touch all 4 extremities   Coordination: No ataxia or dysmetria noted   Prior neuro assessment is c/w  today's exam 03/02/2024.  MSK: No significant TTP left shoulder, no pain with passive range of motion   Physical exam unchanged from the above on reexamination 03/02/24    Assessment/Plan: 1. Functional deficits which require 3+ hours per day of interdisciplinary therapy in a comprehensive inpatient rehab setting. Physiatrist is providing close team supervision and 24 hour management of active medical problems listed below. Physiatrist and rehab team continue to assess barriers to discharge/monitor patient progress toward functional and medical goals  Care Tool:  Bathing    Body parts bathed by patient: Right arm, Left arm, Chest, Abdomen, Front perineal area, Buttocks, Right upper leg, Left upper leg, Face   Body parts bathed by helper: Right lower leg, Left lower leg     Bathing assist Assist Level: Minimal Assistance - Patient > 75%     Upper Body Dressing/Undressing Upper body dressing   What is the patient wearing?: Hospital gown only    Upper body assist Assist Level: Set up assist    Lower Body Dressing/Undressing Lower body dressing      What is the patient wearing?: Hospital gown only     Lower body assist Assist for lower body dressing: Set up assist     Toileting Toileting    Toileting assist Assist for toileting: Minimal Assistance - Patient > 75%  Transfers Chair/bed transfer  Transfers assist     Chair/bed transfer assist level: Supervision/Verbal cueing     Locomotion Ambulation   Ambulation assist      Assist level: Contact Guard/Touching assist Assistive device: Walker-rolling Max distance: 90'   Walk 10 feet activity   Assist     Assist level: Contact Guard/Touching assist Assistive device: Walker-rolling   Walk 50 feet activity   Assist    Assist level: Contact Guard/Touching assist Assistive device: Walker-rolling    Walk 150 feet activity   Assist    Assist level: Contact Guard/Touching assist Assistive  device: Walker-rolling    Walk 10 feet on uneven surface  activity   Assist     Assist level: Contact Guard/Touching assist Assistive device: Walker-rolling   Wheelchair     Assist Is the patient using a wheelchair?: Yes Type of Wheelchair: Manual    Wheelchair assist level: Dependent - Patient 0% Max wheelchair distance: 150'    Wheelchair 50 feet with 2 turns activity    Assist        Assist Level: Dependent - Patient 0%   Wheelchair 150 feet activity     Assist      Assist Level: Dependent - Patient 0%   Blood pressure 123/69, pulse 79, temperature 97.6 F (36.4 C), resp. rate 16, height 5' 8 (1.727 m), weight 89.4 kg, SpO2 100%.  1. Functional deficits secondary to recurrent dislocation left hip s/p closed reduction and irrigation, debridement.  Hip abduction brace when out of bed, pillow in bed.  WBAT LLE with strict posterior hip precautions             -patient may not shower             -ELOS/Goals: Mod I Pt/Ot 10-12 days             - Stable to continue CIR  2.  Antithrombotics: -DVT/anticoagulation:  Mechanical: Sequential compression devices, below knee Bilateral lower extremities Pharmaceutical: Lovenox   Confirmed initiation with Dr. Leandrew below              -antiplatelet therapy: Aspirin  81 mg held due bleeding risk              - Venous Dopplers ordered--negative  3. Pain Management: Oxycodone , Tylenol  and Robaxin  as needed  - Pain well-controlled on current regimen  - 8-8: Switch as needed oxycodone  to tramadol  50 mg every 6 hours as needed per patient request  - 8/9 patient used tramadol  twice last night, continue current regimen  8/10 used 1 dose of as needed tramadol  this morning, pain overall controlled  4. Mood/Behavior/Sleep: LCSW to follow for evaluation and support when available.              -antipsychotic agents: N/A 5. Neuropsych/cognition: This patient is capable of making decisions on his own behalf. 6.  Skin/Wound Care: routine pressure relief measures monitor wound for signs of infection              -Prevena incisional wound VAC changed 8/5 , Continue for 7-10 more days (8/11-13)--minimal output             -Post op Abx cefadroxil  500 mg BID for 7 days ends 8/13.  7. Fluids/Electrolytes/Nutrition: monitor I/O and daily weights--recheck Labs in a.m.              -Regular diet-encourage fluid intake +Ensure supplement -constipation: 1 week since LBM -bowel program Miralax  and Senokot scheduled, patient  agreeable to  enema if unsuccessful today.  - Medium bowel movement 8-7 - 8/10 LBM yesterday  8.  Acute on chronic anemia: hgb 8.8/28.6  monitor  PT/INR recheck in a.m  nursing notified to collect daily FOBT  - Continue ferrous sulfate  and folic acid   - 8/7: Hemoglobin stable.  Change FOBT to once--negative on 8/7  Recheck tomorrow  9. HTN:  Amlodipine  and metoprolol . BP stable  -8/9-10 BP controlled continue current regimen    03/02/2024    5:19 AM 03/02/2024    5:00 AM 03/01/2024    8:14 PM  Vitals with BMI  Weight  197 lbs 1 oz   BMI  29.97   Systolic 123  101  Diastolic 69  60  Pulse 79  80    10.HLD: Atorvastatin   11. Chronic nausea/vomiting: stable -GI has signed off--continue Protonix  40 mg BID   - Transition to p.o. per patient request  - No events since admission rehab  12. L shoulder weakness: pt reports this began around time of hip injury, Will check shoulder xray  - 8-7: X-ray with left AC joint arthritis; no acute fracture--no discomfort on range of motion, monitor  13.  Mild hyponatremia  - Recheck labs tomorrow  LOS: 4 days A FACE TO FACE EVALUATION WAS PERFORMED  Murray Collier 03/02/2024, 1:42 PM

## 2024-03-02 NOTE — Plan of Care (Signed)
  Problem: Consults Goal: RH GENERAL PATIENT EDUCATION Description: See Patient Education module for education specifics. Outcome: Progressing   Problem: RH BOWEL ELIMINATION Goal: RH STG MANAGE BOWEL WITH ASSISTANCE Description: STG Manage Bowel with  mod I Assistance. Outcome: Progressing   Problem: RH BLADDER ELIMINATION Goal: RH STG MANAGE BLADDER WITH ASSISTANCE Description: STG Manage Bladder With mod I Assistance Outcome: Progressing   Problem: RH SKIN INTEGRITY Goal: RH STG SKIN FREE OF INFECTION/BREAKDOWN Description: Manage ski breakdown with mod I assistance Outcome: Progressing   Problem: RH SAFETY Goal: RH STG ADHERE TO SAFETY PRECAUTIONS W/ASSISTANCE/DEVICE Description: STG Adhere to Safety Precautions With mod I Assistance/Device. Outcome: Progressing   Problem: RH PAIN MANAGEMENT Goal: RH STG PAIN MANAGED AT OR BELOW PT'S PAIN GOAL Description: <4 w/ prns Outcome: Progressing   Problem: RH KNOWLEDGE DEFICIT GENERAL Goal: RH STG INCREASE KNOWLEDGE OF SELF CARE AFTER HOSPITALIZATION Description: Manage increase knowledge of self care after hospitalization with mod I assistance from sister using educational materials provided Outcome: Progressing

## 2024-03-03 DIAGNOSIS — S73005D Unspecified dislocation of left hip, subsequent encounter: Secondary | ICD-10-CM | POA: Diagnosis not present

## 2024-03-03 LAB — CBC WITH DIFFERENTIAL/PLATELET
Abs Immature Granulocytes: 0.05 K/uL (ref 0.00–0.07)
Basophils Absolute: 0 K/uL (ref 0.0–0.1)
Basophils Relative: 0 %
Eosinophils Absolute: 0.2 K/uL (ref 0.0–0.5)
Eosinophils Relative: 2 %
HCT: 28.4 % — ABNORMAL LOW (ref 39.0–52.0)
Hemoglobin: 8.8 g/dL — ABNORMAL LOW (ref 13.0–17.0)
Immature Granulocytes: 1 %
Lymphocytes Relative: 10 %
Lymphs Abs: 0.8 K/uL (ref 0.7–4.0)
MCH: 27.5 pg (ref 26.0–34.0)
MCHC: 31 g/dL (ref 30.0–36.0)
MCV: 88.8 fL (ref 80.0–100.0)
Monocytes Absolute: 0.4 K/uL (ref 0.1–1.0)
Monocytes Relative: 5 %
Neutro Abs: 6.4 K/uL (ref 1.7–7.7)
Neutrophils Relative %: 82 %
Platelets: 398 K/uL (ref 150–400)
RBC: 3.2 MIL/uL — ABNORMAL LOW (ref 4.22–5.81)
RDW: 16.1 % — ABNORMAL HIGH (ref 11.5–15.5)
WBC: 7.9 K/uL (ref 4.0–10.5)
nRBC: 0 % (ref 0.0–0.2)

## 2024-03-03 LAB — BASIC METABOLIC PANEL WITH GFR
Anion gap: 8 (ref 5–15)
BUN: <5 mg/dL — ABNORMAL LOW (ref 8–23)
CO2: 27 mmol/L (ref 22–32)
Calcium: 8.2 mg/dL — ABNORMAL LOW (ref 8.9–10.3)
Chloride: 98 mmol/L (ref 98–111)
Creatinine, Ser: 0.63 mg/dL (ref 0.61–1.24)
GFR, Estimated: >60 mL/min (ref >60)
Glucose, Bld: 106 mg/dL — ABNORMAL HIGH (ref 70–99)
Potassium: 4 mmol/L (ref 3.5–5.1)
Sodium: 133 mmol/L — ABNORMAL LOW (ref 135–145)

## 2024-03-03 NOTE — Progress Notes (Signed)
 Met with patient to review current situation, team conference and plan of care. Reviewed medications, pain weight bearing restrictions, brace abductor OOB; Hip Adductor pillow when in bed. Skin with MASD use microguard plus skin protectant cream. Incision care. Continue to follow along to provide educational needs to facilitate preparation for discharge.

## 2024-03-03 NOTE — Plan of Care (Signed)
  Problem: Consults Goal: RH GENERAL PATIENT EDUCATION Description: See Patient Education module for education specifics. Outcome: Progressing   Problem: RH BOWEL ELIMINATION Goal: RH STG MANAGE BOWEL WITH ASSISTANCE Description: STG Manage Bowel with  mod I Assistance. Outcome: Progressing   Problem: RH BLADDER ELIMINATION Goal: RH STG MANAGE BLADDER WITH ASSISTANCE Description: STG Manage Bladder With mod I Assistance Outcome: Progressing   Problem: RH SKIN INTEGRITY Goal: RH STG SKIN FREE OF INFECTION/BREAKDOWN Description: Manage ski breakdown with mod I assistance Outcome: Progressing   Problem: RH SAFETY Goal: RH STG ADHERE TO SAFETY PRECAUTIONS W/ASSISTANCE/DEVICE Description: STG Adhere to Safety Precautions With mod I Assistance/Device. Outcome: Progressing   Problem: RH PAIN MANAGEMENT Goal: RH STG PAIN MANAGED AT OR BELOW PT'S PAIN GOAL Description: <4 w/ prns Outcome: Progressing   Problem: RH KNOWLEDGE DEFICIT GENERAL Goal: RH STG INCREASE KNOWLEDGE OF SELF CARE AFTER HOSPITALIZATION Description: Manage increase knowledge of self care after hospitalization with mod I assistance from sister using educational materials provided Outcome: Progressing

## 2024-03-03 NOTE — Progress Notes (Signed)
 Physical Therapy Session Note  Patient Details  Name: Jonathon Anderson MRN: [id number] Date of Birth: February 24, 1956  Today's Date: 03/03/2024 PT Individual Time: [phone] and 1415-1530 PT Individual Time Calculation (min): 75 min and 75 min.  Short Term Goals: Week 1:  PT Short Term Goal 1 (Week 1): STGs = LTGs  Skilled Therapeutic Interventions/Progress Updates:   First session:  Pt presents supine in bed and agreeable to therapy.  Pt transfers to EOB w/ supervision, occasional cues for THR precautions. Abd  brace donned in sitting, although adjusted during session 2/2 movement.  Pt wheeled to outside for change in scenery and gait on uneven surface.  Pt transfers sit to stand w/ supervision and occasional cues for precautions.  Pt performed standing calf raises, marching and knee flexion 3 x 10.  Pt requires seated rest breaks between trials 2/2 fatigue.  Pt amb x 90' including turn to return to w/c w/ cues especially for L turns to maintain hip precautions, educated on wide turns and maintaining position w/in RW.  Pt returned to inside for use of toilet.  Pt amb into BR and stood for voiding.  Pt continent in toilet w/ supervision and managing clothing.  Pt amb x 90' w/ turn to return to room, cues for precautions.  Pt amb to recliner and remained sitting w/ all needs in reach.  Second session: Pt presents supine in bed and c/o pain to L thigh/hip region.  PT was attempting early session so nursing asked for pain meds and PT returned.  Pt given rest breaks and decreased LE performance this PM for pain management.  Pt encouraged and agreeable eventually to participate w/ PT.  Pt transfers sup to sit w/ supervision.  PT assist w/ donning shoes and then abd brace.  Pt transfers sit to stand w/ supervision and increased time.  Pt amb x 15' into hallway and then sits in w/c follow.  Pt wheeled to small gym.  Pt performs seated UBE at Level 1 10 x 2 w/ seated rest break between, total of 518 revolutions.   Pt performed LAQ 3 x 10, 2.5# RLE and calf raises 3 x 15.  Pt returned to hallway outside room and amb w/ RW w/ CGA into room to bed, cues for turning and maintaining hip precautions.  Pt utilized urinal x 50 ml, charted in Flowsheets.  Pt performed sit to supine w/ self-mobilizing LLE.  ABD pillow appied for positioning in bed.  Bed alarm on and all needs in reach.     Therapy Documentation Precautions:  Precautions Precautions: Posterior Hip Recall of Precautions/Restrictions: Intact Precaution/Restrictions Comments: Able to recall precautions, difficulty with implementation. Wound vac. Required Braces or Orthoses: Other Brace Other Brace: Abdcutor brace Restrictions Weight Bearing Restrictions Per Provider Order: Yes LLE Weight Bearing Per Provider Order: Weight bearing as tolerated Other Position/Activity Restrictions: With brace in place. General:   Vital Signs:   Pain:5/10, PM session 7/10. Pain Assessment Pain Scale: 0-10 Pain Score: 0-No pain    Therapy/Group: Individual Therapy  [name] [name] 03/03/2024, 9:17 AM

## 2024-03-03 NOTE — Progress Notes (Signed)
 PROGRESS NOTE   Subjective/Complaints:  No events overnight.  No acute complaints this a.m. Vitals stable     03/03/2024    4:21 AM 03/03/2024    4:20 AM 03/02/2024    7:40 PM  Vitals with BMI  Weight 195 lbs 12 oz    BMI 29.77    Systolic  114 110  Diastolic  68 64  Pulse  75 82   AM labs significant for stable hyponatremia, otherwise stable.  ROS: Denies insomnia, N/V, abdominal pain, constipation, diarrhea, SOB, cough, chest pain, new weakness or paraesthesias.   + Left proximal thigh pain-improving  Objective:   No results found.  No results for input(s): WBC, HGB, HCT, PLT in the last 72 hours.  No results for input(s): NA, K, CL, CO2, GLUCOSE, BUN, CREATININE, CALCIUM  in the last 72 hours.   Intake/Output Summary (Last 24 hours) at 03/03/2024 0821 Last data filed at 03/03/2024 0600 Gross per 24 hour  Intake 1200 ml  Output 1950 ml  Net -750 ml        Physical Exam: Vital Signs Blood pressure 114/68, pulse 75, temperature 98.6 F (37 C), temperature source Oral, resp. rate 18, height 5' 8 (1.727 m), weight 88.8 kg, SpO2 100%.  General: No apparent distress.  Sitting up in bedside chair. HEENT: Head is normocephalic, atraumatic, sclera anicteric, oral mucosa moist Heart:RRR, no murmurs rubs or gallops. Chest: CTA bilaterally without wheezes, rales, or rhonchi; no distress Abdomen: Soft, non-tender, non--distended, bowel sounds positive  Extremities:  L hip wound vac-prevena- no drainage in canister; minimal edema through left hip and thigh. Psych: Patient is a very pleasant Skin: No breakdown noted on visible portion Neuro:     Mental Status: AAOx3 Speech/Languate: Fluent, follows simple commands CRANIAL NERVES: 2-12 grossly intact     MOTOR: RUE: 5/5 Deltoid, 5/5 Biceps, 5/5 Triceps,5/5 Grip LUE: 2-3/5 Deltoid, 5/5 Biceps, 5/5 Triceps, 5/5 Grip RLE: HF 5/5, KE 5/5, ADF  5/5, APF 5/5 LLE: HF 2/5, KE 4/5, ADF 5/5, APF 5/5   SENSORY: Normal to touch all 4 extremities   Coordination: No ataxia or dysmetria noted  MSK: No significant TTP left shoulder, no pain with passive range of motion   Physical exam unchanged from the above on reexamination 03/03/24    Assessment/Plan: 1. Functional deficits which require 3+ hours per day of interdisciplinary therapy in a comprehensive inpatient rehab setting. Physiatrist is providing close team supervision and 24 hour management of active medical problems listed below. Physiatrist and rehab team continue to assess barriers to discharge/monitor patient progress toward functional and medical goals  Care Tool:  Bathing    Body parts bathed by patient: Right arm, Left arm, Chest, Abdomen, Front perineal area, Buttocks, Right upper leg, Left upper leg, Face   Body parts bathed by helper: Right lower leg, Left lower leg     Bathing assist Assist Level: Minimal Assistance - Patient > 75%     Upper Body Dressing/Undressing Upper body dressing   What is the patient wearing?: Hospital gown only    Upper body assist Assist Level: Set up assist    Lower Body Dressing/Undressing Lower body dressing  What is the patient wearing?: Hospital gown only     Lower body assist Assist for lower body dressing: Set up assist     Toileting Toileting    Toileting assist Assist for toileting: Minimal Assistance - Patient > 75%     Transfers Chair/bed transfer  Transfers assist     Chair/bed transfer assist level: Supervision/Verbal cueing     Locomotion Ambulation   Ambulation assist      Assist level: Contact Guard/Touching assist Assistive device: Walker-rolling Max distance: 90'   Walk 10 feet activity   Assist     Assist level: Contact Guard/Touching assist Assistive device: Walker-rolling   Walk 50 feet activity   Assist    Assist level: Contact Guard/Touching assist Assistive  device: Walker-rolling    Walk 150 feet activity   Assist    Assist level: Contact Guard/Touching assist Assistive device: Walker-rolling    Walk 10 feet on uneven surface  activity   Assist     Assist level: Contact Guard/Touching assist Assistive device: Walker-rolling   Wheelchair     Assist Is the patient using a wheelchair?: Yes Type of Wheelchair: Manual    Wheelchair assist level: Dependent - Patient 0% Max wheelchair distance: 150'    Wheelchair 50 feet with 2 turns activity    Assist        Assist Level: Dependent - Patient 0%   Wheelchair 150 feet activity     Assist      Assist Level: Dependent - Patient 0%   Blood pressure 114/68, pulse 75, temperature 98.6 F (37 C), temperature source Oral, resp. rate 18, height 5' 8 (1.727 m), weight 88.8 kg, SpO2 100%.  1. Functional deficits secondary to recurrent dislocation left hip s/p closed reduction and irrigation, debridement.  Hip abduction brace when out of bed, pillow in bed.  WBAT LLE with strict posterior hip precautions             -patient may not shower             -ELOS/Goals: Mod I Pt/Ot 10-12 days             - Stable to continue CIR  2.  Antithrombotics: -DVT/anticoagulation:  Mechanical: Sequential compression devices, below knee Bilateral lower extremities Pharmaceutical: Lovenox   Confirmed initiation with Dr. Leandrew below              -antiplatelet therapy: Aspirin  81 mg held due bleeding risk              - Venous Dopplers ordered--negative  3. Pain Management: Oxycodone , Tylenol  and Robaxin  as needed  - Pain well-controlled on current regimen  - 8-8: Switch as needed oxycodone  to tramadol  50 mg every 6 hours as needed per patient request  - 8/9 patient used tramadol  twice last night, continue current regimen  8/10 used 1 dose of as needed tramadol  this morning, pain overall controlled  4. Mood/Behavior/Sleep: LCSW to follow for evaluation and support when  available.              -antipsychotic agents: N/A 5. Neuropsych/cognition: This patient is capable of making decisions on his own behalf. 6. Skin/Wound Care: routine pressure relief measures monitor wound for signs of infection              -Prevena incisional wound VAC changed 8/5              -Post op Abx cefadroxil  500 mg BID for 7 days ends 8/13.   -  8/11: Will discuss vac DC w/ Dr. Derrel his office, will remove VAC themselves prior to discharge from inpatient rehab, likely at the end of this week.  7. Fluids/Electrolytes/Nutrition: monitor I/O and daily weights--recheck Labs in a.m.              -Regular diet-encourage fluid intake +Ensure supplement - 8-11: Labs stable.  8.  Acute on chronic anemia: hgb 8.8/28.6  monitor  PT/INR recheck in a.m  nursing notified to collect daily FOBT  - Continue ferrous sulfate  and folic acid   - 8/7: Hemoglobin stable.  Change FOBT to once--negative on 8/7   - FOBT negative  9. HTN:  Amlodipine  and metoprolol . BP stable  -8/9-11 BP controlled continue current regimen    03/03/2024    4:21 AM 03/03/2024    4:20 AM 03/02/2024    7:40 PM  Vitals with BMI  Weight 195 lbs 12 oz    BMI 29.77    Systolic  114 110  Diastolic  68 64  Pulse  75 82    10.HLD: Atorvastatin   11. Chronic nausea/vomiting: stable -GI has signed off--continue Protonix  40 mg BID   - Transition to p.o. per patient request  - No events since admission rehab  12. L shoulder weakness: pt reports this began around time of hip injury, Will check shoulder xray  - 8-7: X-ray with left AC joint arthritis; no acute fracture--no discomfort on range of motion, monitor   13.  Mild hyponatremia  - Stable at 134-133; patient asymptomatic; monitor 14.  Constipation. -constipation: 1 week since LBM -bowel program Miralax  and Senokot scheduled, patient  agreeable to enema if unsuccessful today. - Medium bowel movement 8-7 - 8/10 LBM, large LOS: 5 days A FACE TO FACE  EVALUATION WAS PERFORMED  Joesph JAYSON Likes 03/03/2024, 8:21 AM

## 2024-03-03 NOTE — Progress Notes (Signed)
 Occupational Therapy Session Note  Patient Details  Name: JAYSTIN MCGARVEY MRN: [id number] Date of Birth: 1956/04/16  Today's Date: 03/03/2024 OT Individual Time: 1116-1200 OT Individual Time Calculation (min): 44 min    Short Term Goals: Week 1:  OT Short Term Goal 1 (Week 1): STGs=LTGs due to estimated length of stay.  Skilled Therapeutic Interventions/Progress Updates:    Pt received supine c/o muscle spasms along his L hip. D/t this he requested session to not focus on standing/walking today. Discussed d/c planning at length, including DME, home accessibility, help at home, and daily routines. He came to EOB with (S). Hip abduction brace donned with min A. He stood with increased effort but CGA overall. CGA with the RW for 50 ft of functional mobility to address increased functional activity tolerance. He transferred to the w/c and was then taken outside for improved mood/benefits of fresh air. As he sat outside he engaged in BUE therex strengthening circuit with a level 2 resistance band. He completed bicep curls, tricep extensions, scapular retractions and shoulder flexion. Hands on facilitation required throughout d/t baseline shoulder deficits. Intended carryover to increased UE support on the RW during ADL transfers. He returned to his room and was left sitting up in the w/c with all needs met, chair alarm set. Wound vac plugged in.   Therapy Documentation Precautions:  Precautions Precautions: Posterior Hip Recall of Precautions/Restrictions: Intact Precaution/Restrictions Comments: Able to recall precautions, difficulty with implementation. Wound vac. Required Braces or Orthoses: Other Brace Other Brace: Abdcutor brace Restrictions Weight Bearing Restrictions Per Provider Order: Yes LLE Weight Bearing Per Provider Order: Weight bearing as tolerated Other Position/Activity Restrictions: With brace in place. Therapy/Group: Individual Therapy  Nena VEAR Moats 03/03/2024, 6:21  AM

## 2024-03-04 NOTE — Progress Notes (Signed)
 Patient ID: Jonathon Anderson, male   DOB: 1956-05-12, 68 y.o.   MRN: [id number]  SW met with pt in room to provide updates from team conference, and d/c date is 8/16. Pt prefers HH vs Outpatient. Prefers [facility]. Pt is aware SW will follow-up with update.   SW sent HHPT/OT/aide referral to [facility] and waiting on follow-up.   Graeme Jude, MSW, LCSW Office: [phone] Cell: [phone] Fax: [phone]

## 2024-03-04 NOTE — Progress Notes (Signed)
 PROGRESS NOTE   Subjective/Complaints:  No events overnight.  No acute complaints this a.m. Patient endorses no issues or concerns today.  Still having some mild to moderate pain in the left hip, but feels it is well-controlled on current regimen.  Continues with minimal to no drainage out of wound VAC; discussed that Dr. Montine will be removing this towards the end of his stay.  Patient agreeable with current plan.   ROS: Denies insomnia, N/V, abdominal pain, constipation, diarrhea, SOB, cough, chest pain, new weakness or paraesthesias.   + Left proximal thigh pain-improving  Objective:   No results found.  Recent Labs    03/03/24 1017  WBC 7.9  HGB 8.8*  HCT 28.4*  PLT 398    Recent Labs    03/03/24 1017  NA 133*  K 4.0  CL 98  CO2 27  GLUCOSE 106*  BUN <5*  CREATININE 0.63  CALCIUM  8.2*     Intake/Output Summary (Last 24 hours) at 03/04/2024 1021 Last data filed at 03/04/2024 0813 Gross per 24 hour  Intake 840 ml  Output 850 ml  Net -10 ml        Physical Exam: Vital Signs Blood pressure 105/67, pulse 75, temperature 98.3 F (36.8 C), resp. rate 18, height 5' 8 (1.727 m), weight 85.9 kg, SpO2 100%.  General: No apparent distress.  Sitting up in wheelchair in therapy gym. HEENT: Head is normocephalic, atraumatic, sclera anicteric, oral mucosa moist Heart:RRR, no murmurs rubs or gallops. Chest: CTA bilaterally without wheezes, rales, or rhonchi; no distress Abdomen: Soft, non-tender, non--distended, bowel sounds positive  Extremities:   minimal edema through left hip and thigh. Psych: Patient is a very pleasant Skin: No breakdown noted on visible portion.  Wound VAC well adhered, minimal serosanguineous drainage in tubing, not in canister.  Neuro:     Mental Status: AAOx3 Speech/Languate: Fluent, follows simple commands CRANIAL NERVES: 2-12 grossly intact     MOTOR: RUE: 5/5 Deltoid,  5/5 Biceps, 5/5 Triceps,5/5 Grip LUE: 2-3/5 Deltoid, 5/5 Biceps, 5/5 Triceps, 5/5 Grip RLE: HF 5/5, KE 5/5, ADF 5/5, APF 5/5 LLE: HF 4-/5, KE 4-/5, ADF 5/5, APF 5/5   SENSORY: Normal to touch all 4 extremities   Coordination: No ataxia or dysmetria noted  MSK: No significant TTP left shoulder, no pain with passive range of motion    Assessment/Plan: 1. Functional deficits which require 3+ hours per day of interdisciplinary therapy in a comprehensive inpatient rehab setting. Physiatrist is providing close team supervision and 24 hour management of active medical problems listed below. Physiatrist and rehab team continue to assess barriers to discharge/monitor patient progress toward functional and medical goals  Care Tool:  Bathing    Body parts bathed by patient: Right arm, Left arm, Chest, Abdomen, Front perineal area, Buttocks, Right upper leg, Left upper leg, Face   Body parts bathed by helper: Right lower leg, Left lower leg     Bathing assist Assist Level: Minimal Assistance - Patient > 75%     Upper Body Dressing/Undressing Upper body dressing   What is the patient wearing?: Hospital gown only    Upper body assist Assist Level: Set up assist  Lower Body Dressing/Undressing Lower body dressing      What is the patient wearing?: Hospital gown only     Lower body assist Assist for lower body dressing: Dependent - Patient 0% (per OT note on 8/8)     Toileting Toileting    Toileting assist Assist for toileting: Supervision/Verbal cueing     Transfers Chair/bed transfer  Transfers assist     Chair/bed transfer assist level: Supervision/Verbal cueing     Locomotion Ambulation   Ambulation assist      Assist level: Contact Guard/Touching assist Assistive device: Walker-rolling Max distance: 90'   Walk 10 feet activity   Assist     Assist level: Contact Guard/Touching assist Assistive device: Walker-rolling   Walk 50 feet  activity   Assist    Assist level: Contact Guard/Touching assist Assistive device: Walker-rolling    Walk 150 feet activity   Assist    Assist level: Contact Guard/Touching assist Assistive device: Walker-rolling    Walk 10 feet on uneven surface  activity   Assist     Assist level: Contact Guard/Touching assist Assistive device: Walker-rolling   Wheelchair     Assist Is the patient using a wheelchair?: Yes Type of Wheelchair: Manual    Wheelchair assist level: Dependent - Patient 0% Max wheelchair distance: 150'    Wheelchair 50 feet with 2 turns activity    Assist        Assist Level: Dependent - Patient 0%   Wheelchair 150 feet activity     Assist      Assist Level: Dependent - Patient 0%   Blood pressure 105/67, pulse 75, temperature 98.3 F (36.8 C), resp. rate 18, height 5' 8 (1.727 m), weight 85.9 kg, SpO2 100%.  1. Functional deficits secondary to recurrent dislocation left hip s/p closed reduction and irrigation, debridement.  Hip abduction brace when out of bed, pillow in bed.  WBAT LLE with strict posterior hip precautions             -patient may not shower             -ELOS/Goals: Mod I Pt/Ot 10-12 days--03/08/24             - Stable to continue CIR   - 8/12: Doing well Min A to CGA with OT; downgrading goals to SPV. 90 ft CGA with RW; CGA transfers. Limited by endurance and L hip pain. Poor compliance with brace, safety precautions.   2.  Antithrombotics: -DVT/anticoagulation:  Mechanical: Sequential compression devices, below knee Bilateral lower extremities Pharmaceutical: Lovenox   Confirmed initiation with Dr. Leandrew below              -antiplatelet therapy: Aspirin  81 mg held due bleeding risk              - Venous Dopplers ordered--negative  3. Pain Management: Oxycodone , Tylenol  and Robaxin  as needed  - Pain well-controlled on current regimen  - 8-8: Switch as needed oxycodone  to tramadol  50 mg every 6 hours as  needed per patient request  - 8/9 patient used tramadol  twice last night, continue current regimen  - 8/10 used 1 dose of as needed tramadol  this morning, pain overall controlled  4. Mood/Behavior/Sleep: LCSW to follow for evaluation and support when available.              -antipsychotic agents: N/A 5. Neuropsych/cognition: This patient is capable of making decisions on his own behalf. 6. Skin/Wound Care: routine pressure relief measures monitor wound for signs  of infection              -Prevena incisional wound VAC changed 8/5              -Post op Abx cefadroxil  500 mg BID for 7 days ends 8/13.   - 8/11: Will discuss vac DC w/ Dr. Derrel his office, will remove VAC themselves prior to discharge from inpatient rehab, likely at the end of this week. - 8/12: masd on buttocks; managing conservatively with barrier  7. Fluids/Electrolytes/Nutrition: monitor I/O and daily weights--recheck Labs in a.m.              -Regular diet-encourage fluid intake +Ensure supplement - 8-11: Labs stable.  8.  Acute on chronic anemia: hgb 8.8/28.6  monitor  PT/INR recheck in a.m  nursing notified to collect daily FOBT  - Continue ferrous sulfate  and folic acid   - 8/7: Hemoglobin stable.  Change FOBT to once--negative on 8/7   - FOBT negative  9. HTN:  Amlodipine  and metoprolol . BP stable  -8 BP controlled continue current regimen    03/04/2024    7:07 AM 03/04/2024    5:38 AM 03/03/2024    8:31 PM  Vitals with BMI  Weight 189 lbs 6 oz    BMI 28.8    Systolic  105 109  Diastolic  67 59  Pulse  75 87    10.HLD: Atorvastatin   11. Chronic nausea/vomiting: stable -GI has signed off--continue Protonix  40 mg BID   - Transition to p.o. per patient request  - No events since admission rehab  12. L shoulder weakness: pt reports this began around time of hip injury, Will check shoulder xray  - 8-7: X-ray with left AC joint arthritis; no acute fracture--no discomfort on range of motion, monitor    13.  Mild hyponatremia  - Stable at 134-133; patient asymptomatic; monitor  14.  Constipation. -constipation: 1 week since LBM -bowel program Miralax  and Senokot scheduled, patient  agreeable to enema if unsuccessful today. - Medium bowel movement 8-7 - 8/10 LBM, large LOS: 6 days A FACE TO FACE EVALUATION WAS PERFORMED  Joesph JAYSON Likes 03/04/2024, 10:21 AM

## 2024-03-04 NOTE — Progress Notes (Signed)
 Occupational Therapy Session Note  Patient Details  Name: Jonathon Anderson MRN: [id number] Date of Birth: [birth year]  Session 1 Today's Date: 03/04/2024 OT Individual Time: 0904-1000 OT Individual Time Calculation (min): 56 min    Session 2 Today's Date: 03/04/2024 OT Individual Time: 1350-1459 OT Individual Time Calculation (min): 69 min    Short Term Goals: Week 1:  OT Short Term Goal 1 (Week 1): STGs=LTGs due to estimated length of stay.  Skilled Therapeutic Interventions/Progress Updates:    Session 1 Pt supine with 7/10 pain in his L hip but reporting he is premedicated and agreeable to session. He came to EOB with (S), not wearing abduction pillow at rest. He was challenged to don brace himself. He was able to do so with just light min A while sitting EOB. He stood with close (S) and completed 100 ft of functional mobility with the RW with CGA- progressing to (S). He reported need to urinate. He was quickly taken back to the room via w/c. He was able to use the urinal seated with (S). Pt was taken via w/c to the therapy gym for time management. He completed stair training to practice home accessibility. With B hand rails he was able to do 4 stairs with great carryover of precautions and technique with just CGA. Pt required use of rest breaks throughout session for recovery, as well as to support safety and prevent overexertion. During breaks, OT monitored recovery time to assess endurance and response to exertion. He then completed static standing balance activity with focus on prolonged LLE weightbearing for improved bone density and stability through the hip. He had BUE support throughout and was able to maintain balance with close (S). 2x15 repetitions. He returned to his room and was left sitting up with all needs met.    Session 2 Pt received sitting in the w/c with 5/10 pain in his L hip but agreeable to OT session. His wound vac was beeping and RN notified. He completed 100 ft  of functional mobility to the the therapy gym with the RW with cueing for RW management and stride length. He worked on static standing tolerance with cueing for shifting weight to the LLE in standing. Worked on reducing UE reliance and pt required frequent cueing for upright posture. Multiple repetitions completed, for about 3-5 min each time. Pt completed the BUE ergometer to challenge BUE strength and endurance needed to complete ADLs and IADLs with the highest level of independence. Pt completed 10 min total with no rest breaks. He then completed BITS functional reaching activity to challenge static standing balance to strengthen BLE and reduce reliance on UE for carryover to toileting and LB tasks in standing. 2 repetitions of just under 2 min each trial. He returned to his room and transferred back to supine with just min A for managing the LLE. Abduction pillow donned and pt left supine, all needs within reach.    Therapy Documentation Precautions:  Precautions Precautions: Posterior Hip Recall of Precautions/Restrictions: Intact Precaution/Restrictions Comments: Able to recall precautions, difficulty with implementation. Wound vac. Required Braces or Orthoses: Other Brace Other Brace: Abdcutor brace Restrictions Weight Bearing Restrictions Per Provider Order: Yes LLE Weight Bearing Per Provider Order: Weight bearing as tolerated Other Position/Activity Restrictions: With brace in place.  Therapy/Group: Individual Therapy  Nena VEAR Moats 03/04/2024, 9:18 AM

## 2024-03-04 NOTE — Patient Care Conference (Signed)
 Inpatient RehabilitationTeam Conference and Plan of Care Update Date: 03/04/2024   Time: 1021 am    Patient Name: Jonathon Anderson      Medical Record Number: [id number]  Date of Birth: 26-Apr-1956 Sex: Male         Room/Bed: 4M01C/4M01C-01 Payor Info: Payor: MEDICARE / Plan: MEDICARE PART A AND B / Product Type: *No Product type* /    Admit Date/Time:  02/27/2024 11:26 AM  Primary Diagnosis:  Dislocation, hip, left, subsequent encounter  Hospital Problems: Principal Problem:   Dislocation, hip, left, subsequent encounter Active Problems:   Debility    Expected Discharge Date: Expected Discharge Date: 03/08/24  Team Members Present: Physician leading conference: Dr. Joesph Likes Social Worker Present: Graeme Jude, LCSW Nurse Present: Eulalio Falls, RN PT Present: Catilin Osborn, PT OT Present: Nena Moats, OT SLP Present: Recardo Mole, SLP     Current Status/Progress Goal Weekly Team Focus  Bowel/Bladder      Continent of bowel and bladder    Remain continent of bowel and bladder    Assess bowel and bladder  shift   Swallow/Nutrition/ Hydration               ADL's   Min A LB dressing, (S) UB ADLs, min A to don brace, ADL transfers at [facility]- (S) overall   (S)   ADL, transfers, endurance, pain management    Mobility   transfers w/ supervision, gait up to 95' w/ RW and CGA   150' w/ supervision, supervision for transfers.  increase endurance, continued pt ed for brace and THR precautions.    Communication                Safety/Cognition/ Behavioral Observations               Pain      Left hip pain     < 4 w/ prns   Assess pain q shift   Skin       MASD buttocks Continue gerharts plus foam dressing   Skin free from infection entire stay    Assess skin q shift    Discharge Planning:  Pt will d/c to home with his sister who will provide 24/7 care. Pt needs to be as independent as possible. Support from other siblings. Fam edu on Wed  9am-12pm. SW will confirm there are no barriers to discharge.    Team Discussion: Patient was admitted post  recurrent dislocation of left hip. Patient s/p closed reduction, irrigation and debridement with wound vac.  Patient progress limited by pain, poor endurance  and left shoulder weakness.   Patient on target to meet rehab goals: Currently patient requires supervision assistance with upper body care and minimal assistance with lower body care. Patient requires supervision with transfers. Patient was able to ambulate 150' using a rolling walker with supervision-CGA. Overall goals at discharge are set for supervision assistance.  *See Care Plan and progress notes for long and short-term goals.   Revisions to Treatment Plan:  Weight bearing restrictions Strict posterior hip precaution Adduction pillow  Abduction brace  Teaching Needs: Safety, medications, weight bearing restrictions, toileting, transfers, brace application, etc.   Current Barriers to Discharge: Decreased caregiver support, Home enviroment access/layout, and Weight bearing restrictions  Possible Resolutions to Barriers: Family Education Home health follow up Transport Chair      Medical Summary Current Status: medically complicated by complex post-op wound with wound vac, poor pain control, behavioral challenges, hyponatremia and anemia  Barriers to  Discharge: Behavior/Mood;Complicated Wound;Uncontrolled Pain;Self-care education;Medical stability;Electrolyte abnormality   Possible Resolutions to [employer]: titrate pain medicaitons, wound vac removal per surgery this week, monitor labs for hyponatremia/anemia   Continued Need for Acute Rehabilitation Level of Care: The patient requires daily medical management by a physician with specialized training in physical medicine and rehabilitation for the following reasons: Direction of a multidisciplinary physical rehabilitation program to maximize  functional independence : Yes Medical management of patient stability for increased activity during participation in an intensive rehabilitation regime.: Yes Analysis of laboratory values and/or radiology reports with any subsequent need for medication adjustment and/or medical intervention. : Yes   I attest that I was present, lead the team conference, and concur with the assessment and plan of the team.   Aldon Hengst Gayo 03/04/2024, 1021 am

## 2024-03-04 NOTE — Progress Notes (Signed)
 Physical Therapy Session Note  Patient Details  Name: Jonathon Anderson MRN: [id number] Date of Birth: 06-25-1956  Today's Date: 03/04/2024 PT Individual Time: 1100-1200 PT Individual Time Calculation (min): 60 min   Short Term Goals: Week 1:  PT Short Term Goal 1 (Week 1): STGs = LTGs  Skilled Therapeutic Interventions/Progress Updates: Pt presents sitting in w/c and agreeable to therapy.  Pt wheeled to main gym.  Pt transfers sit to stand w/ supervision.  Pt amb x 120' including turn to return to w/c.  Pt demo's safe L u-turn w/ cueing to maintain positioning for hip precautions.  Pt requires seated rest breaks for fatigue.  Pt required return to room for replacing brief.  Pt stood at bedside for doff abd brace and then re-donned for improved positioning.  Pt performed standing hooking horseshoes over BBALL hoop to B sides w/ ed on maintaining hip precautions.  Pt returned to room and remained sitting in w/c w/ all needs in reach.     Therapy Documentation Precautions:  Precautions Precautions: Posterior Hip Recall of Precautions/Restrictions: Intact Precaution/Restrictions Comments: Able to recall precautions, difficulty with implementation. Wound vac. Required Braces or Orthoses: Other Brace Other Brace: Abdcutor brace Restrictions Weight Bearing Restrictions Per Provider Order: Yes LLE Weight Bearing Per Provider Order: Weight bearing as tolerated Other Position/Activity Restrictions: With brace in place. General:   Vital Signs:   Pain:6/10 Pain Assessment Pain Score: 4    Therapy/Group: Individual Therapy  Alyn Riedinger P Chiniqua Kilcrease 03/04/2024, 12:26 PM

## 2024-03-04 NOTE — Plan of Care (Signed)
  Problem: Consults Goal: RH GENERAL PATIENT EDUCATION Description: See Patient Education module for education specifics. Outcome: Progressing   Problem: RH BOWEL ELIMINATION Goal: RH STG MANAGE BOWEL WITH ASSISTANCE Description: STG Manage Bowel with  mod I Assistance. Outcome: Progressing   Problem: RH BLADDER ELIMINATION Goal: RH STG MANAGE BLADDER WITH ASSISTANCE Description: STG Manage Bladder With mod I Assistance Outcome: Progressing   Problem: RH SKIN INTEGRITY Goal: RH STG SKIN FREE OF INFECTION/BREAKDOWN Description: Manage ski breakdown with mod I assistance Outcome: Progressing   Problem: RH SAFETY Goal: RH STG ADHERE TO SAFETY PRECAUTIONS W/ASSISTANCE/DEVICE Description: STG Adhere to Safety Precautions With mod I Assistance/Device. Outcome: Progressing   Problem: RH PAIN MANAGEMENT Goal: RH STG PAIN MANAGED AT OR BELOW PT'S PAIN GOAL Description: <4 w/ prns Outcome: Progressing   Problem: RH KNOWLEDGE DEFICIT GENERAL Goal: RH STG INCREASE KNOWLEDGE OF SELF CARE AFTER HOSPITALIZATION Description: Manage increase knowledge of self care after hospitalization with mod I assistance from sister using educational materials provided Outcome: Progressing

## 2024-03-04 NOTE — Plan of Care (Signed)
 Goals downgraded to reflect need for safety   Problem: RH Balance Goal: LTG Patient will maintain dynamic standing with ADLs (OT) Description: LTG:  Patient will maintain dynamic standing balance with assist during activities of daily living (OT)  Flowsheets (Taken 03/04/2024 1024) LTG: Pt will maintain dynamic standing balance during ADLs with: (downgraded to reflect need for safety- SD) Supervision/Verbal cueing   Problem: Sit to Stand Goal: LTG:  Patient will perform sit to stand in prep for activites of daily living with assistance level (OT) Description: LTG:  Patient will perform sit to stand in prep for activites of daily living with assistance level (OT) Flowsheets (Taken 03/04/2024 1024) LTG: PT will perform sit to stand in prep for activites of daily living with assistance level: (downgraded to reflect need for safety- SD) Supervision/Verbal cueing   Problem: RH Toileting Goal: LTG Patient will perform toileting task (3/3 steps) with assistance level (OT) Description: LTG: Patient will perform toileting task (3/3 steps) with assistance level (OT)  Flowsheets (Taken 03/04/2024 1024) LTG: Pt will perform toileting task (3/3 steps) with assistance level: (downgraded to reflect need for safety- SD) Supervision/Verbal cueing   Problem: RH Toilet Transfers Goal: LTG Patient will perform toilet transfers w/assist (OT) Description: LTG: Patient will perform toilet transfers with assist, with/without cues using equipment (OT) Flowsheets (Taken 03/04/2024 1024) LTG: Pt will perform toilet transfers with assistance level of: (downgraded to reflect need for safety- SD) Supervision/Verbal cueing   Problem: RH Tub/Shower Transfers Goal: LTG Patient will perform tub/shower transfers w/assist (OT) Description: LTG: Patient will perform tub/shower transfers with assist, with/without cues using equipment (OT) Outcome: Not Applicable

## 2024-03-05 MED ORDER — METHOCARBAMOL 500 MG PO TABS
500.0000 mg | ORAL_TABLET | Freq: Three times a day (TID) | ORAL | Status: DC
  Administered 2024-03-05 – 2024-03-08 (×9): 500 mg via ORAL
  Filled 2024-03-05 (×8): qty 1

## 2024-03-05 MED ORDER — POLYETHYLENE GLYCOL 3350 17 G PO PACK: 17.0000 g | PACK | Freq: Every day | ORAL | Status: DC | PRN

## 2024-03-05 NOTE — Progress Notes (Signed)
 Occupational Therapy Session Note  Patient Details  Name: Jonathon Anderson MRN: [id number] Date of Birth: [birth year]  Session 1 Today's Date: 03/05/2024 OT Individual Time: 9051-8954 OT Individual Time Calculation (min): 57 min    Session 2 Today's Date: 03/05/2024 OT Individual Time: 1345-1455 OT Individual Time Calculation (min): 70 min    Short Term Goals: Week 1:  OT Short Term Goal 1 (Week 1): STGs=LTGs due to estimated length of stay.  Skilled Therapeutic Interventions/Progress Updates:    Session 1 Pt completed supine with mild pain in his L hip, no request for intervention and agreeable to OT session. Family education session completed with pt and his sister Jonathon Anderson. He declined completing ADLs. Verbal education provided re fall risk reduction, energy conservation strategies, home carryover of transfer training, ADLs, and IADLs. Demonstration completed for pt performance of UB/LB bathing and dressing at (S) level, toileting hygiene and transfers, and shower transfers. Provided education and demonstration on DME use recommendations at home. Printed off TTB that they will buy privately, as well as showed walker tray options. Provided LH sponge The following HEP was created- will be reviewed this afternoon. He returned to his room. Pt was left sitting up in the wheelchair with all needs met and call bell within reach.   Access Code: [email or web address] URL: [email or web address] Date: 03/05/2024 Prepared by: Nena Moats  Exercises - Seated Shoulder Horizontal Abduction with Resistance  - 1 x daily - 7 x weekly - 3 sets - 10 reps - Seated Shoulder Flexion with Self-Anchored Resistance  - 1 x daily - 7 x weekly - 3 sets - 10 reps - Seated Shoulder Abduction with Self-Anchored Resistance  - 1 x daily - 7 x weekly - 3 sets - 10 reps - Seated Elbow Flexion with Self-Anchored Resistance  - 1 x daily - 7 x weekly - 3 sets - 10 reps - Seated Elbow Extension with Self-Anchored  Resistance  - 1 x daily - 7 x weekly - 3 sets - 10 reps   Session 2 Pt received sitting EOB with 6/10 pain in his L hip, reporting he is agreeable to a limited session d/t pain. Notified LPN Camie for pain medication and she delivered it before session. Donned abduction brace with min A. He completed 10 ft of functional mobility to the w/c with the RW with close (S). He attempted w/c mobility for about 10 ft before asking for assist d/t fatigue. Reviewed the above HEP with a level two exercise band. Pt required hands on training and modifications to shoulder flex/abd d/t baseline shoulder deficits. He was then taken outside to improve mood and psychosocial adjustment by receiving some fresh air. He returned to his room following. He transferred to supine with min A and was left with all needs met, bed alarm set. Abduction pillow donned.   Therapy Documentation Precautions:  Precautions Precautions: Posterior Hip Recall of Precautions/Restrictions: Intact Precaution/Restrictions Comments: Able to recall precautions, difficulty with implementation. Wound vac. Required Braces or Orthoses: Other Brace Other Brace: Abdcutor brace Restrictions Weight Bearing Restrictions Per Provider Order: Yes LLE Weight Bearing Per Provider Order: Weight bearing as tolerated Other Position/Activity Restrictions: With brace in place.  Therapy/Group: Individual Therapy  Nena VEAR Moats 03/05/2024, 6:07 AM

## 2024-03-05 NOTE — Progress Notes (Signed)
 Physical Therapy Session Note  Patient Details  Name: Jonathon Anderson MRN: [id number] Date of Birth: 09-07-1955  Today's Date: 03/05/2024 PT Individual Time: 1109-1205 PT Individual Time Calculation (min): 56 min   Short Term Goals: Week 1:  PT Short Term Goal 1 (Week 1): STGs = LTGs  Skilled Therapeutic Interventions/Progress Updates:    Pt presents in [facility] in room with pt sister Elveria present for family education session. Pt denies pain throughout session. Session focused on therapeutic activities for facilitating participation and safety with managing self care tasks, car transfer training, stair training, and gait training. Pt completes transfers throughout session with supervision with RW. Pt transported via WC to main gym for time management. Pt ambulates 95' with RW close supervision, LLE decreased stance time noted, with increased UE weightbearing noted with L stance phase, decreased proximity to RW able to correct minimally with cues. Upon return to sitting following gait trial pt requesting to urgently use restroom. Pt taken quickly back to room and completes ambulatory transfer with RW supervision to toilet, requires min assist and cues to maintain brace on for clothing management. Pt continent of bowel, charted, and stands to complete periarea hygiene with supervision with unilateral UE support on RW. Pt comes to sitting in [facility] with supervision and RW, returned to main gym. Pt completes up/down stairs with BHRs CGA demonstrating excellent carryover of education for ascending leading with RLE, descending with LLE. Therapist educates pt sister on importance of remaining close to pt during task due to fall risk as well as managing RW up/down stairs for pt with pt sister verbalizing understanding. Pt then compeltes car transfer to car simulator positioned at pt sister's personal vehicle height, therapist provides education on posterior hip precautions for transfer with visual demonstration  provided. Pt requires min assist for managing LLE into/out of car however pt maintains precautions throughout task. Pt returns to room completes stand step transfer to bed with RW supervision, completes bed mobility with supervision and increased time. Pt remains semi reclined in bed with hip abductor in place, with all needs within reach, cal light in place and bed alarm activated at end of session.   Therapy Documentation Precautions:  Precautions Precautions: Posterior Hip Recall of Precautions/Restrictions: Intact Precaution/Restrictions Comments: Able to recall precautions, difficulty with implementation. Wound vac. Required Braces or Orthoses: Other Brace Other Brace: Abdcutor brace Restrictions Weight Bearing Restrictions Per Provider Order: Yes LLE Weight Bearing Per Provider Order: Weight bearing as tolerated Other Position/Activity Restrictions: With brace in place.   Therapy/Group: Individual Therapy  [name] PT, DPT 03/05/2024, 12:56 PM

## 2024-03-05 NOTE — Progress Notes (Signed)
 PROGRESS NOTE   Subjective/Complaints:  No events overnight.   Vitals are stable   patient states that he is still having some pain and edema in the left hip, but feels overall that this is tolerable for therapies.  He does complain of some muscle spasms in his left thigh, which are present for some time.   ROS: Denies insomnia, N/V, abdominal pain, constipation, diarrhea, SOB, cough, chest pain, new weakness or paraesthesias.   + Left proximal thigh pain ongoing, stable  Objective:   No results found.  Recent Labs    03/03/24 1017  WBC 7.9  HGB 8.8*  HCT 28.4*  PLT 398    Recent Labs    03/03/24 1017  NA 133*  K 4.0  CL 98  CO2 27  GLUCOSE 106*  BUN <5*  CREATININE 0.63  CALCIUM  8.2*     Intake/Output Summary (Last 24 hours) at 03/05/2024 0842 Last data filed at 03/05/2024 0809 Gross per 24 hour  Intake 360 ml  Output 850 ml  Net -490 ml        Physical Exam: Vital Signs Blood pressure 121/67, pulse 79, temperature 98 F (36.7 C), resp. rate 16, height 5' 8 (1.727 m), weight 88.7 kg, SpO2 99%.  General: No apparent distress.  Sitting up in bedside chair. HEENT: Head is normocephalic, atraumatic, sclera anicteric, oral mucosa moist Heart:RRR, no murmurs rubs or gallops. Chest: CTA bilaterally without wheezes, rales, or rhonchi; no distress Abdomen: Soft, non-tender, non--distended, bowel sounds positive  Extremities:  1+ edema through left hip and thigh. Psych: Patient is a very pleasant Skin: No breakdown noted on visible portion.  Wound VAC well adhered, minimal serosanguineous drainage in tubing, none in canister.  Unchanged  Neuro:     Mental Status: AAOx3 Speech/Languate: Fluent, follows simple commands CRANIAL NERVES: 2-12 grossly intact     MOTOR: 5 out of 5 bilateral upper and lower extremities with exception of 3-5 left deltoid and 4-5 left hip flexion   SENSORY: Normal to  touch all 4 extremities   Coordination: No ataxia or dysmetria noted  MSK: Left lower extremity tightness of the lower rectus femoris muscle, with some mild tenderness on palpation.  Full passive range of motion, no apparent tone.   Assessment/Plan: 1. Functional deficits which require 3+ hours per day of interdisciplinary therapy in a comprehensive inpatient rehab setting. Physiatrist is providing close team supervision and 24 hour management of active medical problems listed below. Physiatrist and rehab team continue to assess barriers to discharge/monitor patient progress toward functional and medical goals  Care Tool:  Bathing    Body parts bathed by patient: Right arm, Left arm, Chest, Abdomen, Front perineal area, Buttocks, Right upper leg, Left upper leg, Face   Body parts bathed by helper: Right lower leg, Left lower leg     Bathing assist Assist Level: Minimal Assistance - Patient > 75%     Upper Body Dressing/Undressing Upper body dressing   What is the patient wearing?: Hospital gown only    Upper body assist Assist Level: Set up assist    Lower Body Dressing/Undressing Lower body dressing      What is the patient  wearing?: Hospital gown only     Lower body assist Assist for lower body dressing: Dependent - Patient 0% (per OT note on 8/8)     Toileting Toileting    Toileting assist Assist for toileting: Supervision/Verbal cueing     Transfers Chair/bed transfer  Transfers assist     Chair/bed transfer assist level: Supervision/Verbal cueing     Locomotion Ambulation   Ambulation assist      Assist level: Contact Guard/Touching assist Assistive device: Walker-rolling Max distance: 120   Walk 10 feet activity   Assist     Assist level: Contact Guard/Touching assist Assistive device: Walker-rolling   Walk 50 feet activity   Assist    Assist level: Contact Guard/Touching assist Assistive device: Walker-rolling    Walk 150  feet activity   Assist    Assist level: Contact Guard/Touching assist Assistive device: Walker-rolling    Walk 10 feet on uneven surface  activity   Assist     Assist level: Contact Guard/Touching assist Assistive device: Walker-rolling   Wheelchair     Assist Is the patient using a wheelchair?: Yes Type of Wheelchair: Manual    Wheelchair assist level: Dependent - Patient 0% Max wheelchair distance: 150'    Wheelchair 50 feet with 2 turns activity    Assist        Assist Level: Dependent - Patient 0%   Wheelchair 150 feet activity     Assist      Assist Level: Dependent - Patient 0%   Blood pressure 121/67, pulse 79, temperature 98 F (36.7 C), resp. rate 16, height 5' 8 (1.727 m), weight 88.7 kg, SpO2 99%.  1. Functional deficits secondary to recurrent dislocation left hip s/p closed reduction and irrigation, debridement.  Hip abduction brace when out of bed, pillow in bed.  WBAT LLE with strict posterior hip precautions             -patient may not shower             -ELOS/Goals: Mod I Pt/Ot 10-12 days--03/08/24             - Stable to continue CIR   - 8/12: Doing well Min A to CGA with OT; downgrading goals to SPV. 90 ft CGA with RW; CGA transfers. Limited by endurance and L hip pain. Poor compliance with brace, safety precautions.   2.  Antithrombotics: -DVT/anticoagulation:  Mechanical: Sequential compression devices, below knee Bilateral lower extremities Pharmaceutical: Lovenox   Confirmed initiation with Dr. Leandrew below              -antiplatelet therapy: Aspirin  81 mg held due bleeding risk              - Venous Dopplers ordered--negative  3. Pain Management: Oxycodone , Tylenol  and Robaxin  as needed  - Pain well-controlled on current regimen  - 8-8: Switch as needed oxycodone  to tramadol  50 mg every 6 hours as needed per patient request  - 8/9 patient used tramadol  twice last night, continue current regimen  - 8/10 used 1 dose of  as needed tramadol  this morning, pain overall controlled  8-13: Schedule Robaxin  500 mg 3 times daily.  Using as needed tramadol  1-2 times daily, will likely need short course at discharge as well.  4. Mood/Behavior/Sleep: LCSW to follow for evaluation and support when available.              -antipsychotic agents: N/A 5. Neuropsych/cognition: This patient is capable of making decisions on his own behalf.  6. Skin/Wound Care: routine pressure relief measures monitor wound for signs of infection              -Prevena incisional wound VAC changed 8/5              -Post op Abx cefadroxil  500 mg BID for 7 days ends 8/13.   - 8/11: Will discuss vac DC w/ Dr. Derrel his office, will remove VAC themselves prior to discharge from inpatient rehab, likely at the end of this week. - 8/12: masd on buttocks; managing conservatively with barrier  7. Fluids/Electrolytes/Nutrition: monitor I/O and daily weights--recheck Labs in a.m.              -Regular diet-encourage fluid intake +Ensure supplement - 8-11: Labs stable.  8.  Acute on chronic anemia: hgb 8.8/28.6  monitor  PT/INR recheck in a.m  nursing notified to collect daily FOBT  - Continue ferrous sulfate  and folic acid   - 8/7: Hemoglobin stable.  Change FOBT to once--negative on 8/7   - FOBT negative  9. HTN:  Amlodipine  and metoprolol . BP stable  -8 BP controlled continue current regimen    03/05/2024    5:22 AM 03/05/2024    5:00 AM 03/04/2024    8:19 PM  Vitals with BMI  Weight  195 lbs 9 oz   BMI  29.74   Systolic 121  110  Diastolic 67  62  Pulse 79  82    10.HLD: Atorvastatin   11. Chronic nausea/vomiting: stable -GI has signed off--continue Protonix  40 mg BID   - Transition to p.o. per patient request  - No events since admission rehab  12. L shoulder weakness: pt reports this began around time of hip injury, Will check shoulder xray  - 8-7: X-ray with left AC joint arthritis; no acute fracture--no discomfort on range of  motion, monitor   13.  Mild hyponatremia  - Stable at 134-133; patient asymptomatic; monitor  14.  Constipation. -constipation: 1 week since LBM -bowel program Miralax  and Senokot scheduled, patient  agreeable to enema if unsuccessful today. - Medium bowel movement 8-7 - Last bowel meant 8/13, liquid, large.  DC Senokot, move MiraLAX  to daily as needed LOS: 7 days A FACE TO FACE EVALUATION WAS PERFORMED  Jonathon Anderson 03/05/2024, 8:42 AM

## 2024-03-05 NOTE — Progress Notes (Signed)
 Patient ID: Jonathon Anderson, male   DOB: 1956-04-22, 68 y.o.   MRN: [id number]  HHPT/OT/aide referral accepted by [facility].   SW met with pt in room to inform on above.   Graeme Jude, MSW, LCSW Office: [phone] Cell: [phone] Fax: [phone]

## 2024-03-06 ENCOUNTER — Other Ambulatory Visit (HOSPITAL_COMMUNITY): Payer: Self-pay

## 2024-03-06 LAB — CBC WITH DIFFERENTIAL/PLATELET
Abs Immature Granulocytes: 0.03 K/uL (ref 0.00–0.07)
Basophils Absolute: 0 K/uL (ref 0.0–0.1)
Basophils Relative: 0 %
Eosinophils Absolute: 0.1 K/uL (ref 0.0–0.5)
Eosinophils Relative: 2 %
HCT: 24.7 % — ABNORMAL LOW (ref 39.0–52.0)
Hemoglobin: 7.7 g/dL — ABNORMAL LOW (ref 13.0–17.0)
Immature Granulocytes: 0 %
Lymphocytes Relative: 11 %
Lymphs Abs: 0.8 K/uL (ref 0.7–4.0)
MCH: 26.9 pg (ref 26.0–34.0)
MCHC: 31.2 g/dL (ref 30.0–36.0)
MCV: 86.4 fL (ref 80.0–100.0)
Monocytes Absolute: 0.4 K/uL (ref 0.1–1.0)
Monocytes Relative: 5 %
Neutro Abs: 6.3 K/uL (ref 1.7–7.7)
Neutrophils Relative %: 82 %
Platelets: 343 K/uL (ref 150–400)
RBC: 2.86 MIL/uL — ABNORMAL LOW (ref 4.22–5.81)
RDW: 15.8 % — ABNORMAL HIGH (ref 11.5–15.5)
WBC: 7.7 K/uL (ref 4.0–10.5)
nRBC: 0 % (ref 0.0–0.2)

## 2024-03-06 LAB — BASIC METABOLIC PANEL WITH GFR
Anion gap: 8 (ref 5–15)
BUN: <5 mg/dL — ABNORMAL LOW (ref 8–23)
CO2: 27 mmol/L (ref 22–32)
Calcium: 8 mg/dL — ABNORMAL LOW (ref 8.9–10.3)
Chloride: 97 mmol/L — ABNORMAL LOW (ref 98–111)
Creatinine, Ser: 0.46 mg/dL — ABNORMAL LOW (ref 0.61–1.24)
GFR, Estimated: >60 mL/min (ref >60)
Glucose, Bld: 101 mg/dL — ABNORMAL HIGH (ref 70–99)
Potassium: 4.3 mmol/L (ref 3.5–5.1)
Sodium: 132 mmol/L — ABNORMAL LOW (ref 135–145)

## 2024-03-06 MED ORDER — FOLIC ACID 1 MG PO TABS
1.0000 mg | ORAL_TABLET | Freq: Every day | ORAL | 0 refills | Status: AC
  Filled 2024-03-06: qty 30, 30d supply, fill #0

## 2024-03-06 MED ORDER — DICLOFENAC SODIUM 1 % EX GEL
2.0000 g | Freq: Four times a day (QID) | CUTANEOUS | Status: DC
  Administered 2024-03-06 – 2024-03-07 (×5): 2 g via TOPICAL
  Filled 2024-03-06: qty 100

## 2024-03-06 MED ORDER — ACETAMINOPHEN 325 MG PO TABS: 325.0000 mg | ORAL_TABLET | ORAL | Status: AC | PRN

## 2024-03-06 MED ORDER — PANTOPRAZOLE SODIUM 40 MG PO TBEC
40.0000 mg | DELAYED_RELEASE_TABLET | Freq: Two times a day (BID) | ORAL | 0 refills | Status: AC
  Filled 2024-03-06: qty 60, 30d supply, fill #0

## 2024-03-06 MED ORDER — DICLOFENAC SODIUM 1 % EX GEL
2.0000 g | Freq: Four times a day (QID) | CUTANEOUS | 0 refills | Status: AC
  Filled 2024-03-06: qty 100, 13d supply, fill #0

## 2024-03-06 MED ORDER — TRAMADOL HCL 50 MG PO TABS
50.0000 mg | ORAL_TABLET | Freq: Four times a day (QID) | ORAL | 0 refills | Status: DC | PRN
  Filled 2024-03-06: qty 21, 6d supply, fill #0

## 2024-03-06 MED ORDER — METHOCARBAMOL 500 MG PO TABS
500.0000 mg | ORAL_TABLET | Freq: Three times a day (TID) | ORAL | 0 refills | Status: AC
  Filled 2024-03-06: qty 90, 30d supply, fill #0

## 2024-03-06 MED ORDER — POLYETHYLENE GLYCOL 3350 17 GM/SCOOP PO POWD
17.0000 g | Freq: Every day | ORAL | 0 refills | Status: DC | PRN
  Filled 2024-03-06: qty 238, 14d supply, fill #0

## 2024-03-06 MED ORDER — ASPIRIN 81 MG PO TBEC
81.0000 mg | DELAYED_RELEASE_TABLET | Freq: Two times a day (BID) | ORAL | 0 refills | Status: AC
Start: 2024-03-08 — End: 2024-03-26
  Filled 2024-03-06: qty 36, 18d supply, fill #0

## 2024-03-06 MED ORDER — FERROUS SULFATE 325 (65 FE) MG PO TABS
325.0000 mg | ORAL_TABLET | Freq: Every day | ORAL | 0 refills | Status: AC
  Filled 2024-03-06: qty 30, 30d supply, fill #0

## 2024-03-06 NOTE — Progress Notes (Signed)
 Physical Therapy Session Note  Patient Details  Name: BORDEN THUNE MRN: [id number] Date of Birth: [birth year]  Today's Date: 03/06/2024 PT Individual Time: 1015-1130 PT Individual Time Calculation (min): 75 min   Short Term Goals: Week 1:  PT Short Term Goal 1 (Week 1): STGs = LTGs  Skilled Therapeutic Interventions/Progress Updates: Pt presents sitting in w/c and agreeable to therapy.  Pt states fatigue and pain to L knee/thigh.  Rest breaks utilized for pain management.  Ace wrap applied to L knee and requested order for Voltaren  gel as uses bio-freeze at home.  Pt wheeled to small gym for energy conservation.  Pt performed UBE x 15' at Level 2 for endurance and strengthening for total of 342 revolutions, mets 1.8 and .4 miles.  Pt performed step transfer to mat table for donning of ace wrap and re-adjustment of abduction brace. Pt required return to room and uses urinal for 150 ml.  Pt returned to main gym for stair negotiation x 8 steps w/ supervision using 2 rails.  Pt performed seated calf raises 2 x 15 and then chest press 2 x 10 w/ 2# weighted bar.  Pt returned to hallway outside room and amb to recliner w/ RW and sup.  Pt remained sitting w/ LES elevated and all needs in reach.      Therapy Documentation Precautions:  Precautions Precautions: Posterior Hip Recall of Precautions/Restrictions: Intact Precaution/Restrictions Comments: Able to recall precautions, difficulty with implementation. Wound vac. Required Braces or Orthoses: Other Brace Other Brace: Abdcutor brace Restrictions Weight Bearing Restrictions Per Provider Order: Yes LLE Weight Bearing Per Provider Order: Weight bearing as tolerated Other Position/Activity Restrictions: With brace in place. General:   Vital Signs:   Pain:6/10 L knee/thigh Pain Assessment Pain Scale: 0-10 Pain Score: 7  Pain Location: Knee Pain Intervention(s): Medication (See eMAR)    Therapy/Group: Individual Therapy  Jauna Raczynski P  Kenlei Safi 03/06/2024, 11:33 AM

## 2024-03-06 NOTE — Discharge Instructions (Addendum)
 Inpatient Rehab Discharge Instructions  Jonathon Anderson Discharge date and time: 03/08/2024   Activities/Precautions/ Functional Status: Activity: activity as tolerated and no lifting, driving, or strenuous exercise for until cleared by MD.  Diet: regular diet Wound Care: keep wound clean and dry and reinforce dressing PRN Functional status:  ___ No restrictions     ___ Walk up steps independently ___ 24/7 supervision/assistance   ___ Walk up steps with assistance ___ Intermittent supervision/assistance  ___ Bathe/dress independently ___ Walk with walker     ___ Bathe/dress with assistance ___ Walk Independently    ___ Shower independently ___ Walk with assistance    ___ Shower with assistance _X__ No alcohol      ___ Return to work/school ________  Special Instructions:  - hip abduction brace when out of bed, pillow in bed.   -Follow up: 3-4 weeks after surgery for a wound check and suture removal with Dr. Edna at [facility].   -Increase activity slowly as tolerated, but follow the weight bearing instructions below. Continue with total hip precautions for the left leg. Do not drive for the next 4 weeks. In addition, you cannot be taking narcotics while you drive, and you must feel in control of the vehicle.   My questions have been answered and I understand these instructions. I will adhere to these goals and the provided educational materials after my discharge from the hospital.  Patient/Caregiver Signature _______________________________ Date __________  Clinician Signature _______________________________________ Date __________  Please bring this form and your medication list with you to all your follow-up doctor's appointments.

## 2024-03-06 NOTE — Progress Notes (Signed)
 PROGRESS NOTE   Subjective/Complaints:  No events overnight.  No acute complaints. Vac removed yesterday, no drainage, hip feeling sore but ok.  Vitals are stable   ROS: Denies insomnia, N/V, abdominal pain, constipation, diarrhea, SOB, cough, chest pain, new weakness or paraesthesias.   + Left proximal thigh pain ongoing, stable  Objective:   No results found.  Recent Labs    03/03/24 1017 03/06/24 0447  WBC 7.9 7.7  HGB 8.8* 7.7*  HCT 28.4* 24.7*  PLT 398 343    Recent Labs    03/03/24 1017 03/06/24 0447  NA 133* 132*  K 4.0 4.3  CL 98 97*  CO2 27 27  GLUCOSE 106* 101*  BUN <5* <5*  CREATININE 0.63 0.46*  CALCIUM  8.2* 8.0*     Intake/Output Summary (Last 24 hours) at 03/06/2024 0928 Last data filed at 03/06/2024 0700 Gross per 24 hour  Intake 709 ml  Output 600 ml  Net 109 ml        Physical Exam: Vital Signs Blood pressure 110/61, pulse 77, temperature 98.3 F (36.8 C), resp. rate 18, height 5' 8 (1.727 m), weight 89 kg, SpO2 100%.  General: No apparent distress.  Sitting up in bedside chair. HEENT: Head is normocephalic, atraumatic, sclera anicteric, oral mucosa moist Heart:RRR, no murmurs rubs or gallops. Chest: CTA bilaterally without wheezes, rales, or rhonchi; no distress Abdomen: Soft, non-tender, non--distended, bowel sounds positive  Extremities:  1+ edema through left hip and thigh--unchanged Psych: Patient is a very pleasant Skin: No breakdown noted on visible portion.  Post-surgical dressing over L hip, no apparent drainage.  Neuro:     Mental Status: AAOx3 Speech/Languate: Fluent, follows simple commands CRANIAL NERVES: 2-12 grossly intact     MOTOR: 5 out of 5 bilateral upper and lower extremities with exception of 3-5 left deltoid and 4-5 left hip flexion   SENSORY: Normal to touch all 4 extremities   Coordination: No ataxia or dysmetria noted  MSK: Left lower  extremity tightness/edema, mild ttp around thigh/hip. stable   Assessment/Plan: 1. Functional deficits which require 3+ hours per day of interdisciplinary therapy in a comprehensive inpatient rehab setting. Physiatrist is providing close team supervision and 24 hour management of active medical problems listed below. Physiatrist and rehab team continue to assess barriers to discharge/monitor patient progress toward functional and medical goals  Care Tool:  Bathing    Body parts bathed by patient: Right arm, Left arm, Chest, Abdomen, Front perineal area, Buttocks, Right upper leg, Left upper leg, Face   Body parts bathed by helper: Right lower leg, Left lower leg     Bathing assist Assist Level: Minimal Assistance - Patient > 75%     Upper Body Dressing/Undressing Upper body dressing   What is the patient wearing?: Hospital gown only    Upper body assist Assist Level: Set up assist    Lower Body Dressing/Undressing Lower body dressing      What is the patient wearing?: Hospital gown only     Lower body assist Assist for lower body dressing: Dependent - Patient 0% (per OT note on 8/8)     Toileting Toileting    Toileting assist Assist  for toileting: Supervision/Verbal cueing     Transfers Chair/bed transfer  Transfers assist     Chair/bed transfer assist level: Supervision/Verbal cueing     Locomotion Ambulation   Ambulation assist      Assist level: Contact Guard/Touching assist Assistive device: Walker-rolling Max distance: 120   Walk 10 feet activity   Assist     Assist level: Contact Guard/Touching assist Assistive device: Walker-rolling   Walk 50 feet activity   Assist    Assist level: Contact Guard/Touching assist Assistive device: Walker-rolling    Walk 150 feet activity   Assist    Assist level: Contact Guard/Touching assist Assistive device: Walker-rolling    Walk 10 feet on uneven surface  activity   Assist      Assist level: Contact Guard/Touching assist Assistive device: Walker-rolling   Wheelchair     Assist Is the patient using a wheelchair?: Yes Type of Wheelchair: Manual    Wheelchair assist level: Dependent - Patient 0% Max wheelchair distance: 150'    Wheelchair 50 feet with 2 turns activity    Assist        Assist Level: Dependent - Patient 0%   Wheelchair 150 feet activity     Assist      Assist Level: Dependent - Patient 0%   Blood pressure 110/61, pulse 77, temperature 98.3 F (36.8 C), resp. rate 18, height 5' 8 (1.727 m), weight 89 kg, SpO2 100%.  1. Functional deficits secondary to recurrent dislocation left hip s/p closed reduction and irrigation, debridement.  Hip abduction brace when out of bed, pillow in bed.  WBAT LLE with strict posterior hip precautions             -patient may not shower             -ELOS/Goals: Mod I Pt/Ot 10-12 days--03/08/24             - Stable to continue CIR   - 8/12: Doing well Min A to CGA with OT; downgrading goals to SPV. 90 ft CGA with RW; CGA transfers. Limited by endurance and L hip pain. Poor compliance with brace, safety precautions.   2.  Antithrombotics: -DVT/anticoagulation:  Mechanical: Sequential compression devices, below knee Bilateral lower extremities Pharmaceutical: Lovenox   Confirmed initiation with Dr. Leandrew below              -antiplatelet therapy: Aspirin  81 mg held due bleeding risk              - Venous Dopplers ordered--negative   - 8/14: ASA 81 mg for DVT ppx at discharge per ortho, 28d total  3. Pain Management: Oxycodone , Tylenol  and Robaxin  as needed  - Pain well-controlled on current regimen  - 8-8: Switch as needed oxycodone  to tramadol  50 mg every 6 hours as needed per patient request  - 8/9 patient used tramadol  twice last night, continue current regimen  - 8/10 used 1 dose of as needed tramadol  this morning, pain overall controlled  8-13: Schedule Robaxin  500 mg 3 times daily.   Using as needed tramadol  1-2 times daily, will likely need short course at discharge as well.  4. Mood/Behavior/Sleep: LCSW to follow for evaluation and support when available.              -antipsychotic agents: N/A 5. Neuropsych/cognition: This patient is capable of making decisions on his own behalf. 6. Skin/Wound Care: routine pressure relief measures monitor wound for signs of infection              -  Prevena incisional wound VAC changed 8/5              -Post op Abx cefadroxil  500 mg BID for 7 days ends 8/13.   - 8/11: Will discuss vac DC w/ Dr. Derrel his office, will remove VAC themselves prior to discharge from inpatient rehab, likely at the end of this week--removed 8/13 - 8/12: masd on buttocks; managing conservatively with barrier  7. Fluids/Electrolytes/Nutrition: monitor I/O and daily weights--recheck Labs in a.m.              -Regular diet-encourage fluid intake +Ensure supplement - 8-11: Labs stable.  8.  Acute on chronic anemia: hgb 8.8/28.6  monitor  PT/INR recheck in a.m  nursing notified to collect daily FOBT  - Continue ferrous sulfate  and folic acid   - 8/7: Hemoglobin stable.  Change FOBT to once--negative on 8/7   - FOBT negative   - 8/14: HgB 7.7; mild decrease, no apparent bleeding, follow up as OP  9. HTN:  Amlodipine  and metoprolol . BP stable  -8 BP controlled continue current regimen    03/06/2024    4:23 AM 03/06/2024    4:00 AM 03/05/2024    9:51 PM  Vitals with BMI  Weight  196 lbs 3 oz   BMI  29.84   Systolic 110  104  Diastolic 61  56  Pulse 77  83    10.HLD: Atorvastatin   11. Chronic nausea/vomiting: stable -GI has signed off--continue Protonix  40 mg BID   - Transition to p.o. per patient request  - No events since admission rehab  12. L shoulder weakness: pt reports this began around time of hip injury, Will check shoulder xray  - 8-7: X-ray with left AC joint arthritis; no acute fracture--no discomfort on range of motion,  monitor   13.  Mild hyponatremia  - Stable at 134-133; patient asymptomatic; monitor  14.  Constipation. -constipation: 1 week since LBM -bowel program Miralax  and Senokot scheduled, patient  agreeable to enema if unsuccessful today. - Medium bowel movement 8-7 - Last bowel meant 8/13, liquid, large.  DC Senokot, move MiraLAX  to daily as needed LOS: 8 days A FACE TO FACE EVALUATION WAS PERFORMED  Joesph JAYSON Likes 03/06/2024, 9:28 AM

## 2024-03-06 NOTE — Discharge Summary (Addendum)
 Physician Discharge Summary  Patient ID: Jonathon Anderson MRN: [id number] DOB/AGE: July 18, 1956 68 y.o.  Admit date: 02/27/2024 Discharge date: 03/08/2024  Discharge Diagnoses:  Principal Problem:   Dislocation, hip, left, subsequent encounter Active Problems:   Closed subtrochanteric fracture of hip, left, initial encounter (HCC)   Hypertension   Mixed hyperlipidemia   Debility   Constipation   Discharged Condition: stable  Significant Diagnostic Studies: VAS US  LOWER EXTREMITY VENOUS (DVT) Result Date: 02/28/2024  Lower Venous DVT Study Patient Name:  Jonathon Anderson  Date of Exam:   02/28/2024 Medical Rec #: [id number]          Accession #:    [id number] Date of Birth: 12-03-1955          Patient Gender: M Patient Age:   61 years Exam Location:  [facility] [place] Procedure:      VAS US  LOWER EXTREMITY VENOUS (DVT) Referring Phys: DAPHNE Allannah Kempen --------------------------------------------------------------------------------  Indications: Immobility.  Performing Technologist: Elmarie Lindau, RVT  Examination Guidelines: A complete evaluation includes B-mode imaging, spectral Doppler, color Doppler, and power Doppler as needed of all accessible portions of each vessel. Bilateral testing is considered an integral part of a complete examination. Limited examinations for reoccurring indications may be performed as noted. The reflux portion of the exam is performed with the patient in reverse Trendelenburg.  +---------+---------------+---------+-----------+----------+--------------+ RIGHT    CompressibilityPhasicitySpontaneityPropertiesThrombus Aging +---------+---------------+---------+-----------+----------+--------------+ CFV      Full           Yes      Yes                                 +---------+---------------+---------+-----------+----------+--------------+ SFJ      Full                                                         +---------+---------------+---------+-----------+----------+--------------+ FV Prox  Full                                                        +---------+---------------+---------+-----------+----------+--------------+ FV Mid   Full                                                        +---------+---------------+---------+-----------+----------+--------------+ FV DistalFull                                                        +---------+---------------+---------+-----------+----------+--------------+ PFV      Full                                                        +---------+---------------+---------+-----------+----------+--------------+  POP      Full           Yes      Yes                                 +---------+---------------+---------+-----------+----------+--------------+ PTV      Full                                                        +---------+---------------+---------+-----------+----------+--------------+ PERO     Full                                                        +---------+---------------+---------+-----------+----------+--------------+   +---------+---------------+---------+-----------+----------+--------------+ LEFT     CompressibilityPhasicitySpontaneityPropertiesThrombus Aging +---------+---------------+---------+-----------+----------+--------------+ CFV      Full           Yes      Yes                                 +---------+---------------+---------+-----------+----------+--------------+ SFJ      Full                                                        +---------+---------------+---------+-----------+----------+--------------+ FV Prox  Full                                                        +---------+---------------+---------+-----------+----------+--------------+ FV Mid   Full                                                         +---------+---------------+---------+-----------+----------+--------------+ FV DistalFull                                                        +---------+---------------+---------+-----------+----------+--------------+ PFV      Full                                                        +---------+---------------+---------+-----------+----------+--------------+ POP      Full           Yes      Yes                                 +---------+---------------+---------+-----------+----------+--------------+  PTV      Full                                                        +---------+---------------+---------+-----------+----------+--------------+ PERO     Full                                                        +---------+---------------+---------+-----------+----------+--------------+     Summary: RIGHT: - There is no evidence of deep vein thrombosis in the lower extremity.  - No cystic structure found in the popliteal fossa.  LEFT: - There is no evidence of deep vein thrombosis in the lower extremity.  - No cystic structure found in the popliteal fossa.  *See table(s) above for measurements and observations. Electronically signed by Debby Robertson on 02/28/2024 at 5:15:47 PM.    Final    DG Shoulder Left Result Date: 02/27/2024 CLINICAL DATA:  [id number] Weakness 141835 EXAM: LEFT SHOULDER - 2+ VIEW COMPARISON:  None Available. FINDINGS: No acute fracture or dislocation. Moderate joint space loss of the AC joint. Soft tissues are unremarkable. IMPRESSION: 1. No acute fracture or dislocation. 2. 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 Electronically Signed   By: Rogelia Myers M.D.   On: 02/27/2024 19:26   MR ABDOMEN W WO CONTRAST Result Date: 02/21/2024 CLINICAL DATA:  Intra-abdominal neoplasm suspected EXAM: MRI ABDOMEN WITHOUT AND WITH CONTRAST TECHNIQUE: Multiplanar multisequence MR imaging of the abdomen was performed both before and after the administration of  intravenous contrast. CONTRAST:  10mL GADAVIST  GADOBUTROL  1 MMOL/ML IV SOLN COMPARISON:  CT abdomen pelvis February 20, 2024 FINDINGS: Lower chest: No acute findings. Hepatobiliary: There is a hepatic dome segment 8 T2 hyperintense nonenhancing cyst measuring 7.9 x 6.9 cm. A punctate satellite cyst is also identified measuring 4 mm next to this cyst. There is a nonenhancing T2 hyperintense small cyst measuring 1 cm in sub subcapsular right hepatic lobe posterior to right hepatic vein (3/22). There is a lobulated lesion measuring 4.6 x 2.6x 4.3 cm in subcapsular posterior aspect of segment 6 with peripheral discontinuous nodular enhancement and gradual filling on delayed phase sequence consistent with hemangioma. These lesions are stable to prior CT. No significant hepatic steatosis. Mild hepatomegaly measuring 18.2 cm in craniocaudal dimension. Gallbladder stones/sludge. Pancreas: No mass, inflammatory changes, or other parenchymal abnormality identified. Mild atrophic changes. Spleen:  Within normal limits in size and appearance. Adrenals/Urinary Tract: Few T2 hyperintense subcentimeter simple cortical cysts in both kidneys. No evidence of hydronephrosis. Normal adrenal glands. Stomach/Bowel: No mass, inflammatory changes, or other parenchymal abnormality identified. Vascular/Lymphatic: No pathologically enlarged lymph nodes identified. No abdominal aortic aneurysm demonstrated. Other: Partial visualization of T2 hyperintense soft tissue changes around the left hip prosthesis better assessed on recent CT. Musculoskeletal: T2 hyperintense lesion in L4 vertebral body likely hemangioma. IMPRESSION: Mild hepatomegaly with multiple liver cysts. A right hepatic lobe lesion with signal characteristics most consistent with hemangioma. Recommend follow-up to ensure stability. Few simple renal cortical cysts which does not require imaging follow-up. L4 lesion likely representing intraosseous hemangioma. Electronically Signed    By: Megan  Zare M.D.   On: 02/21/2024 14:23   CT Angio Chest  Pulmonary Embolism (PE) W or WO Contrast Result Date: 02/20/2024 CLINICAL DATA:  Concern for pulmonary embolism and bowel obstruction. EXAM: CT ANGIOGRAPHY CHEST CT ABDOMEN AND PELVIS WITH CONTRAST TECHNIQUE: Multidetector CT imaging of the chest was performed using the standard protocol during bolus administration of intravenous contrast. Multiplanar CT image reconstructions and MIPs were obtained to evaluate the vascular anatomy. Multidetector CT imaging of the abdomen and pelvis was performed using the standard protocol during bolus administration of intravenous contrast. RADIATION DOSE REDUCTION: This exam was performed according to the departmental dose-optimization program which includes automated exposure control, adjustment of the mA and/or kV according to patient size and/or use of iterative reconstruction technique. CONTRAST:  OMNIPAQUE  IOHEXOL  350 MG/ML SOLN COMPARISON:  None Available. FINDINGS: CTA CHEST FINDINGS Cardiovascular: There is no cardiomegaly or pericardial effusion. The thoracic aorta is unremarkable. The origins of the great vessels of the aortic arch are patent. Evaluation of the pulmonary arteries is limited due to respiratory motion. No central pulmonary artery embolus identified. Mediastinum/Nodes: No hilar or mediastinal adenopathy. The esophagus is grossly unremarkable. No mediastinal fluid collection. Lungs/Pleura: No focal consolidation, pleural effusion, pneumothorax. There are bibasilar atelectasis. The central airways are patent. Musculoskeletal: Degenerative changes of the spine. No acute osseous pathology. Review of the MIP images confirms the above findings. CT ABDOMEN and PELVIS FINDINGS No intra-abdominal free air or free fluid. Hepatobiliary: There is a 6.8 x 7.3 cm cyst in the dome of the liver. Additional 3.7 x 2.2 cm heterogeneously enhancing lesion in the posterior right lobe of the liver is not  characterized on this CT. Further characterization with MRI without and with contrast recommended. No biliary dilatation. There is sludge or small stones in the gallbladder. No pericholecystic fluid or evidence of acute cholecystitis by CT. Pancreas: Unremarkable. No pancreatic ductal dilatation or surrounding inflammatory changes. Spleen: Normal in size without focal abnormality. Adrenals/Urinary Tract: The adrenal glands are unremarkable. There is no hydronephrosis on either side. There is symmetric enhancement and excretion of contrast by both kidneys. The visualized ureters and urinary bladder appear unremarkable. Stomach/Bowel: There is sigmoid diverticulosis. There is no bowel obstruction or active inflammation. The appendix is normal. Vascular/Lymphatic: Mild aortoiliac atherosclerotic disease. The IVC is unremarkable. No portal venous gas. There is no adenopathy. Reproductive: The prostate and seminal vesicles are grossly remarkable. Other: Small fat containing umbilical hernia. Musculoskeletal: There is a total left hip arthroplasty. A 10 x 11 cm fluid collection with pockets of air and enhancing walls adjacent to the arthroplasty is may be postsurgical but concerning for an infectious process. Clinical correlation is recommended. There is degenerative changes of the spine. No acute osseous pathology. Review of the MIP images confirms the above findings. IMPRESSION: 1. No acute intrathoracic pathology. No CT evidence of central pulmonary artery embolus. 2. Sigmoid diverticulosis. No bowel obstruction. Normal appendix. 3. A complex periprostatic collection adjacent to the left hip arthroplasty may be postsurgical but concerning for an infectious process. 4. A 3.7 x 2.2 cm heterogeneously enhancing lesion in the posterior right lobe of the liver is not characterized on this CT. Further characterization with MRI without and with contrast recommended. 5. Cholelithiasis. 6.  Aortic Atherosclerosis (ICD10-I70.0).  Electronically Signed   By: Vanetta Chou M.D.   On: 02/20/2024 11:29   CT ABDOMEN PELVIS W CONTRAST Result Date: 02/20/2024 CLINICAL DATA:  Concern for pulmonary embolism and bowel obstruction. EXAM: CT ANGIOGRAPHY CHEST CT ABDOMEN AND PELVIS WITH CONTRAST TECHNIQUE: Multidetector CT imaging of the chest was performed using  the standard protocol during bolus administration of intravenous contrast. Multiplanar CT image reconstructions and MIPs were obtained to evaluate the vascular anatomy. Multidetector CT imaging of the abdomen and pelvis was performed using the standard protocol during bolus administration of intravenous contrast. RADIATION DOSE REDUCTION: This exam was performed according to the departmental dose-optimization program which includes automated exposure control, adjustment of the mA and/or kV according to patient size and/or use of iterative reconstruction technique. CONTRAST:  OMNIPAQUE  IOHEXOL  350 MG/ML SOLN COMPARISON:  None Available. FINDINGS: CTA CHEST FINDINGS Cardiovascular: There is no cardiomegaly or pericardial effusion. The thoracic aorta is unremarkable. The origins of the great vessels of the aortic arch are patent. Evaluation of the pulmonary arteries is limited due to respiratory motion. No central pulmonary artery embolus identified. Mediastinum/Nodes: No hilar or mediastinal adenopathy. The esophagus is grossly unremarkable. No mediastinal fluid collection. Lungs/Pleura: No focal consolidation, pleural effusion, pneumothorax. There are bibasilar atelectasis. The central airways are patent. Musculoskeletal: Degenerative changes of the spine. No acute osseous pathology. Review of the MIP images confirms the above findings. CT ABDOMEN and PELVIS FINDINGS No intra-abdominal free air or free fluid. Hepatobiliary: There is a 6.8 x 7.3 cm cyst in the dome of the liver. Additional 3.7 x 2.2 cm heterogeneously enhancing lesion in the posterior right lobe of the liver is not  characterized on this CT. Further characterization with MRI without and with contrast recommended. No biliary dilatation. There is sludge or small stones in the gallbladder. No pericholecystic fluid or evidence of acute cholecystitis by CT. Pancreas: Unremarkable. No pancreatic ductal dilatation or surrounding inflammatory changes. Spleen: Normal in size without focal abnormality. Adrenals/Urinary Tract: The adrenal glands are unremarkable. There is no hydronephrosis on either side. There is symmetric enhancement and excretion of contrast by both kidneys. The visualized ureters and urinary bladder appear unremarkable. Stomach/Bowel: There is sigmoid diverticulosis. There is no bowel obstruction or active inflammation. The appendix is normal. Vascular/Lymphatic: Mild aortoiliac atherosclerotic disease. The IVC is unremarkable. No portal venous gas. There is no adenopathy. Reproductive: The prostate and seminal vesicles are grossly remarkable. Other: Small fat containing umbilical hernia. Musculoskeletal: There is a total left hip arthroplasty. A 10 x 11 cm fluid collection with pockets of air and enhancing walls adjacent to the arthroplasty is may be postsurgical but concerning for an infectious process. Clinical correlation is recommended. There is degenerative changes of the spine. No acute osseous pathology. Review of the MIP images confirms the above findings. IMPRESSION: 1. No acute intrathoracic pathology. No CT evidence of central pulmonary artery embolus. 2. Sigmoid diverticulosis. No bowel obstruction. Normal appendix. 3. A complex periprostatic collection adjacent to the left hip arthroplasty may be postsurgical but concerning for an infectious process. 4. A 3.7 x 2.2 cm heterogeneously enhancing lesion in the posterior right lobe of the liver is not characterized on this CT. Further characterization with MRI without and with contrast recommended. 5. Cholelithiasis. 6.  Aortic Atherosclerosis (ICD10-I70.0).  Electronically Signed   By: Vanetta Chou M.D.   On: 02/20/2024 11:29   DG HIP UNILAT W OR W/O PELVIS 2-3 VIEWS LEFT Result Date: 02/18/2024 CLINICAL DATA:  Postop reduction EXAM: DG HIP (WITH OR WITHOUT PELVIS) 2-3V LEFT COMPARISON:  02/18/2024 FINDINGS: Interval reduction of the previously seen dislocated left hip prosthesis. Normal alignment. No visible acute fracture. IMPRESSION: Interval reduction. Electronically Signed   By: Franky Crease M.D.   On: 02/18/2024 17:39   DG HIP UNILAT WITH PELVIS 2-3 VIEWS LEFT Result Date: 02/18/2024 CLINICAL  DATA:  Dislocated left hip prosthesis EXAM: DG HIP (WITH OR WITHOUT PELVIS) 2-3V LEFT COMPARISON:  02/18/2024 FINDINGS: Multiple intraoperative images initially demonstrate dislocated left hip prosthesis. Final images demonstrate reduction. IMPRESSION: Intraoperative imaging for reduction of the dislocated left hip prosthesis. Electronically Signed   By: Franky Crease M.D.   On: 02/18/2024 17:38   DG Pelvis Portable Result Date: 02/18/2024 CLINICAL DATA:  Left hip instability preoperative EXAM: PORTABLE PELVIS 1-2 VIEWS COMPARISON:  January 18, 2024 FINDINGS: Status post left hip total arthroplasty with superolateral dislocation of the left femoral component surgical clips superimposing the right femoral trochanter. No acute osseous fracture. Soft tissues are unremarkable. He she high, Similar to prior radiograph. IMPRESSION: Superolateral dislocation of left hip arthroplasty femoral component. Electronically Signed   By: Megan  Zare M.D.   On: 02/18/2024 14:53   DG C-Arm 1-60 Min-No Report Result Date: 02/18/2024 Fluoroscopy was utilized by the requesting physician.  No radiographic interpretation.   DG C-Arm 1-60 Min-No Report Result Date: 02/18/2024 Fluoroscopy was utilized by the requesting physician.  No radiographic interpretation.    Labs:  Basic Metabolic Panel: Recent Labs  Lab 03/03/24 1017 03/06/24 0447  NA 133* 132*  K 4.0 4.3  CL 98 97*   CO2 27 27  GLUCOSE 106* 101*  BUN <5* <5*  CREATININE 0.63 0.46*  CALCIUM  8.2* 8.0*    CBC: Recent Labs  Lab 03/03/24 1017 03/06/24 0447  WBC 7.9 7.7  NEUTROABS 6.4 6.3  HGB 8.8* 7.7*  HCT 28.4* 24.7*  MCV 88.8 86.4  PLT 398 343     Brief HPI:   Jonathon Anderson is a 68 y.o. male  with PMHx of history of HTN, HLD, and arthritis. He underwent a left total hip arthroplasty revision on 01/09/24 and a subsequent revision on January 21, 2024, following a dislocation due to a hardware removal, proximal femur replacement, and soft tissue advancement. His incisions continued to demonstrate poor wound healing with chronic clear drainage, leading to his admission to [facility] on 7/28 for closed reduction, irrigation, and debridement of the left hip arthroplasty with wound vac placement by Dr. Edna.  Postoperative complications included hypoxemia, anemia, and vomiting. A CT angiogram ruled out a pulmonary embolism. Preoperatively, his hemoglobin was 9.5 and fell to 5.5 postoperatively, for which he received 2 units of packed red blood cells and 1 unit of FFP. Aspirin  was discontinued. GI was consulted and did not suspect a GI bleed and recommended supportive care with Protonix , it was felt that the anemia was likely secondary to bleeding at the surgical site in the setting of a supratherapeutic INR. A CT abdomen pelvis showed an incidental finding of a 3.7 x 2.2 cm heterogeneously enhancing lesion in the posterior right lobe of the liver, with further care through excision with MRI recommended. The MRI of the abdomen showed multiple liver cysts with the right hepatic lobe lesion having signal characteristics most consistent with a hemangioma. The patient received multiple doses of vitamin K  due to an INR of around 7.1. Pt reports L shoulder weakness since his injury. Oncology was consulted and agreed with the management. Prior to admission patient was Mod I with RW and independent with ADLs  living in mobile home with 6 steps to enter. He currently requires assistance with ADLs with mod-min assist for mobility with device. Therapy evaluations completed due to patient decreased functional mobility was admitted for a comprehensive rehab program.      Hospital Course: Jonathon Anderson  was admitted to rehab 02/27/2024 for inpatient therapies to consist of PT, ST and OT at least three hours five days a week. Past admission physiatrist, therapy team and rehab RN have worked together to provide customized collaborative inpatient rehab. Patient was admitted to inpatient rehab for functional deficits following a recurrent dislocation of the left hip. He underwent a closed reduction and irrigation debridement with weight-bearing restrictions and strict posterior hip precautions. The patient was initially on Lovenox  for DVT prophylaxis, transitioning to aspirin  81 mg for a total of 28 days as per orthopedic recommendations. A venous Doppler study was negative for DVT. Pain management included multimodal therapies such as Robaxin , Tramadol , and Tylenol  as needed, along with Diclofenac  topical cream, which provided significant relief. A wound vac was placed on the incisional wound and discontinued on 03/05/2024. Follow up has been arranged with Dr. Vassie for suture removal in the outpatient setting.  The buttocks excoriation was managed with barrier cream and routine pressure relief measures. There were no signs of infection at the wound site. The patient maintained a regular diet with encouraged fluid intake. He has a history of acute on chronic anemia, with hemoglobin levels dropping from 8.8 to 7.7 without significant bleeding, fecal occult blood test was negative. A follow-up with gastroenterology is scheduled in two months. He will continue with ferrous sulfate  and folic acid  supplements. Blood pressures were monitored on TID basis and his hypertension was stable on the current regimen, and his blood  pressure was well-managed. Atorvastatin  was continued for hyperlipidemia. Chronic nausea and vomiting were addressed by GI with a continuation of Protonix  twice daily. An x-ray for left shoulder weakness showed left AC joint arthritis but no acute fracture. Hyponatremia was asymptomatic, with levels stabilizing independently. Patient has been advised to follow up with primary care provider for ongoing management. Constipation was managed with a bowel program using Miralax  and Senokot, resulting in a significant bowel movement without further adjustments.   Rehab course: During patient's stay in rehab weekly team conferences were held to monitor patient's progress, set goals and discuss barriers to discharge. At admission, patient required  Mod I with RW and independent with ADLs living in mobile home with 6 steps to enter. He currently requires assistance with ADLs with mod-min assist for mobility with device. In physical therapy, Jonathon Anderson met 8 out of 9 long-term goals, achieving ambulation up to 110 feet with a rolling walker under supervision. To continue advancing safe functional mobility, addressing strength impairments, pain, and minimizing fall risk, he will benefit from ongoing skilled PT services through home health care. He has access to a rolling walker at home. In occupational therapy, he met all long-term goals and will be discharged at a supervision modified independent level, with recommendations for ongoing OT services to continue supporting his rehabilitation progress. Family teaching completed upon discharge to home.      Disposition: Discharge disposition: 01-Home or Self Care        Diet: Regular Diet    Special Instructions:  -No driving smoking or alcohol  or illicit drug use    - hip abduction brace when out of bed, pillow in bed.   -Follow up: 3-4 weeks after surgery for a wound check and suture removal with Dr. Edna at [facility].   -Increase  activity slowly as tolerated, but follow the weight bearing instructions below. Continue with total hip precautions for the left leg. Do not drive for the next 4 weeks. In addition, you cannot be taking narcotics  while you drive, and you must feel in control of the vehicle.   Allergies as of 03/07/2024   No Known Allergies      Medication List     STOP taking these medications    cefadroxil  500 MG capsule Commonly known as: DURICEF   celecoxib  100 MG capsule Commonly known as: CeleBREX    cyclobenzaprine  10 MG tablet Commonly known as: FLEXERIL    ondansetron  4 MG tablet Commonly known as: ZOFRAN    polyethylene glycol 17 g packet Commonly known as: MiraLax  Replaced by: polyethylene glycol powder 17 GM/SCOOP powder       TAKE these medications    acetaminophen  325 MG tablet Commonly known as: TYLENOL  Take 1-2 tablets (325-650 mg total) by mouth every 4 (four) hours as needed for mild pain (pain score 1-3). What changed:  medication strength how much to take when to take this reasons to take this   amLODipine  10 MG tablet Commonly known as: NORVASC  Take 10 mg by mouth in the morning.   aspirin  EC 81 MG tablet Take 1 tablet (81 mg total) by mouth 2 (two) times daily for 18 days. Swallow whole. Start taking on: March 08, 2024   atorvastatin  20 MG tablet Commonly known as: LIPITOR Take 20 mg by mouth at bedtime.   BIOFREEZE EX Apply 1 Application topically daily as needed (pain).   diclofenac  Sodium 1 % Gel Commonly known as: VOLTAREN  Apply 2 g topically 4 (four) times daily.   FeroSul 325 (65 Fe) MG tablet Generic drug: ferrous sulfate  Take 1 tablet (325 mg total) by mouth daily with breakfast.   folic acid  1 MG tablet Commonly known as: FOLVITE  Take 1 tablet (1 mg total) by mouth daily.   magnesium  hydroxide 400 MG/5ML suspension Commonly known as: MILK OF MAGNESIA Take 15 mLs by mouth daily as needed (constipation.).   methocarbamol  500 MG  tablet Commonly known as: ROBAXIN  Take 1 tablet (500 mg total) by mouth 3 (three) times daily.   metoprolol  tartrate 100 MG tablet Commonly known as: LOPRESSOR  Take 100 mg by mouth 2 (two) times daily.   pantoprazole  40 MG tablet Commonly known as: PROTONIX  Take 1 tablet (40 mg total) by mouth 2 (two) times daily.   polyethylene glycol powder 17 GM/SCOOP powder Commonly known as: GLYCOLAX /MIRALAX  Take 17 g by mouth daily as needed for mild constipation or moderate constipation. Replaces: polyethylene glycol 17 g packet   traMADol  50 MG tablet Commonly known as: ULTRAM  Take 1 tablet (50 mg total) by mouth every 6 (six) hours as needed for moderate pain (pain score 4-6).        Follow-up Information     Hamrick, Charlene CROME, MD Follow up.   Specialty: Family Medicine Why: Call for an appointment within 1-2 weeks Contact information: [address] [place] [address] [phone]         Edna Toribio LABOR, MD Follow up.   Specialty: Orthopedic Surgery Why: Call for an appointment. Follow up: 3-4 weeks after surgery for a wound check and suture removal with Dr. Edna at [facility]. Contact information: [address] [place] [address] [phone]         Kriss Stagger H, DO Follow up.   Specialty: Gastroenterology Why: Call to schedule an appointment within 2 months for anemia. Contact information: [address] [place] [address] [phone]                 Signed: Daphne  L Petrina Melby 03/07/2024, 4:49 PM

## 2024-03-06 NOTE — Plan of Care (Signed)
  Problem: Consults Goal: RH GENERAL PATIENT EDUCATION Description: See Patient Education module for education specifics. Outcome: Progressing   Problem: RH SKIN INTEGRITY Goal: RH STG SKIN FREE OF INFECTION/BREAKDOWN Description: Manage ski breakdown with mod I assistance Outcome: Progressing

## 2024-03-06 NOTE — Progress Notes (Signed)
 Occupational Therapy Session Note  Patient Details  Name: Jonathon Anderson MRN: [id number] Date of Birth: 1956-07-14  Today's Date: 03/06/2024 OT Individual Time: 9150-9049 OT Individual Time Calculation (min): 61 min    Short Term Goals: Week 1:  OT Short Term Goal 1 (Week 1): STGs=LTGs due to estimated length of stay.  Skilled Therapeutic Interventions/Progress Updates:  Pt greeted seated EOB, pt agreeable to OT intervention.      Transfers/bed mobility/functional mobility:  Pt completed sit>stands with CGA, pt completed stand pivot to w/c with RW and CGA.     ADLs:  LB dressing: donned posterior hip brace in standing with total A however pt was able to direct level of care when donning.    Exercises: pt completed below BUE therex with 3lb dowel rod for 2 mins with 1 min rest break provided to facilitate improved functional endurance:  Chest presses  Bicep curls Forward rows Horizontal shoulder abb/add with pt pushing bar side/side   Pt completed 2x3 mins of continuous BUE aerobic activity with pt instructed to use 2lb dowel rod to toss ball back and forth to facilitate improved BUE endurance  Pt completed 3 mins on Nustep UE ergometer for BUE strength and endurance, level 1 resistance for , stats listed below: .06 miles, 51 revolutions,  1.6 avg mets   Ended session with pt seated in w/c with all needs within reach.   Therapy Documentation Precautions:  Precautions Precautions: Posterior Hip Recall of Precautions/Restrictions: Intact Precaution/Restrictions Comments: Able to recall precautions, difficulty with implementation. Wound vac. Required Braces or Orthoses: Other Brace Other Brace: Abdcutor brace Restrictions Weight Bearing Restrictions Per Provider Order: Yes LLE Weight Bearing Per Provider Order: Weight bearing as tolerated Other Position/Activity Restrictions: With brace in place.  Pain: 7/10 pain reported in LLE, graded session to meet pt needs  for pain mgmt. Rest breaks provided as needed.    Therapy/Group: Individual Therapy  [name] 03/06/2024, 12:00 PM

## 2024-03-06 NOTE — Group Note (Signed)
 Patient Details Name: Jonathon Anderson MRN: [id number] DOB: 24-Oct-1955 Today's Date: 03/06/2024  Time Calculation: OT Group Time Calculation OT Group Start Time: 1430 OT Group Stop Time: 1530 OT Group Time Calculation (min): 60 min      Group Description: Dance Group: Pt participated in dance group with an emphasis on social interaction, motor planning, increasing overall activity tolerance and bimanual tasks. All songs were selected by group members. Dance moves included AROM of BUE/BLE gross motor movements with an emphasis on building functional endurance.   Individual level documentation: Patient completed group from sitting level. Patientt needed supervision to complete various dance moves with cues to maintain hip precautions and to support participation.  Patient needed min modifications during group.  Pain:  0/10  Precautions:  Posterior Hip  Jonathon Anderson 03/06/2024, 3:48 PM

## 2024-03-06 NOTE — Progress Notes (Signed)
    17 Days Post-Op Procedure(s) (LRB): IRRIGATION AND DEBRIDEMENT WOUND (Left) APPLICATION, WOUND VAC (Left) CLOSED REDUCTION, HIP  Subjective: Patient currently at CIR.  Mobilizing better and improved appetite day by day. Patient states he may be discharging home this Saturday if continues to improve.  Minimal hip soreness today.  Denies numbness or tingling in leg, no other concerns today.   Objective:   VITALS:   Vitals:   03/05/24 2151 03/06/24 0400 03/06/24 0423 03/06/24 1300  BP: (!) 104/56  110/61 104/64  Pulse: 83  77 80  Resp:   18 17  Temp:   98.3 F (36.8 C) 98.1 F (36.7 C)  TempSrc:    Oral  SpO2:   100% 100%  Weight:  89 kg    Height:        AAOx4, resting in NAD Sensation intact distally Intact pulses distally Dorsiflexion/Plantar flexion intact Incision: dressing C/D/I Compartment soft Tolerates gentle hip ROM Wiggles toes appropriately Wound VAC holding suction 120 mmHg and no output in canister Wound VAC removed, sutures in place, skin healing well without erythema or drainage, Aquacel placed   Lab Results  Component Value Date   WBC 7.7 03/06/2024   HGB 7.7 (L) 03/06/2024   HCT 24.7 (L) 03/06/2024   MCV 86.4 03/06/2024   PLT 343 03/06/2024   BMET    Component Value Date/Time   NA 132 (L) 03/06/2024 0447   K 4.3 03/06/2024 0447   CL 97 (L) 03/06/2024 0447   CO2 27 03/06/2024 0447   GLUCOSE 101 (H) 03/06/2024 0447   BUN <5 (L) 03/06/2024 0447   CREATININE 0.46 (L) 03/06/2024 0447   CALCIUM  8.0 (L) 03/06/2024 0447   GFRNONAA >60 03/06/2024 0447     Xray: Reduced proximal femoral replacement arthroplasty in good alignment without adverse features  Assessment/Plan:     Principal Problem:   Dislocation, hip, left, subsequent encounter Active Problems:   Closed subtrochanteric fracture of hip, left, initial encounter (HCC)   Hypertension   Mixed hyperlipidemia   Debility  Status post closed reduction left hip dislocation and  irrigation debridement superficial wound dehiscence 02/18/24  Post op recs: WB: WBAT LLE with strict posterior hip precautions, hip abduction brace when out of bed, pillow in bed. Abx: ancef  post op, finished outpatient bactrim  while at CIR Imaging: PACU xrays Dressing: Prevena incisional wound VAC changed 02/26/24, then removed on 03/06/24 and replaced with Aquacel. Incision benign and healing well. Sutures in place 5-7 more days, plan to remove in office at follow up. DVT prophylaxis: Aspirin  81 mg Follow up: 3-4 weeks after surgery for a wound check and suture removal with Dr. Edna at [facility].  Address: [address], [place], [place] [address]  Office Phone: [phone]    Jonathon Anderson 03/06/2024, 5:03 PM    Contact information:   Weekdays 7am-5pm epic message Dr. Edna, or call office for patient follow up: [phone] After hours and holidays please check [email or web address] for group call information for [facility]

## 2024-03-07 DIAGNOSIS — K5900 Constipation, unspecified: Secondary | ICD-10-CM | POA: Diagnosis not present

## 2024-03-07 NOTE — Progress Notes (Signed)
 Physical Therapy Session Note  Patient Details  Name: Jonathon Anderson MRN: [id number] Date of Birth: Dec 10, 1955  Today's Date: 03/07/2024 PT Individual Time: 0917-1030 and 1402-1430 PT Individual Time Calculation (min): 73 min and 28 min  Short Term Goals: Week 1:  PT Short Term Goal 1 (Week 1): STGs = LTGs  Skilled Therapeutic Interventions/Progress Updates:   First session:  Pt presents sitting in w/c and agreeable to therapy.  Pt states pain meds given and voltaren  gel and ace wrap to L knee for pain management, rest given during session.  Pt wheeled to small gym for energy conservation.  Pt performed car transfer at sedan height w/ supervision only, pt assisting w/ bringing LLE in/out of car.  Pt amb up/down ramp surface w/ supervision, ed for safe turns to maintain hip precautions. Pt negotiated 8 6 steps w/ B rails and supervision.  Pt amb x 50' w/ RW and supervision.  Pt w/ c/o pain to LLE and refuses further gait.  Pt returned to room and amb x 10' to recliner.  Pt remained sitting in recliner w/ LES elevated and all needs in reach.    Second session:  Pt presents sitting in recliner and agreeable to therapy but c/o pain.  PT opened pt ed folder and educated on HEP.  Pt able to recall 3/3 hip precautions.  Pt educated and practiced doff and then don of abduction brace.  Pt remained sitting in recliner w/ LES elevated.     Therapy Documentation Precautions:  Precautions Precautions: Posterior Hip Recall of Precautions/Restrictions: Intact Precaution/Restrictions Comments: Able to recall precautions Required Braces or Orthoses: Other Brace Other Brace: abductor brace Restrictions Weight Bearing Restrictions Per Provider Order: Yes LLE Weight Bearing Per Provider Order: Weight bearing as tolerated Other Position/Activity Restrictions: With brace in place. General:   Vital Signs:   Pain:6/10 , 5/10 second session. Pain Assessment Pain Scale: 0-10 Pain Score: 5  Pain  Location: Knee Pain Orientation: Left Pain Descriptors / Indicators: Aching Pain Onset: On-going Pain Intervention(s): Medication (See eMAR);Other (Comment) (voltaren  gel) Mobility: Bed Mobility Bed Mobility: Supine to Sit;Sit to Supine Supine to Sit: Supervision/Verbal cueing (pt using UES for LLE movement, supervision for maintaining hip precautions.) Sit to Supine: Supervision/Verbal cueing Transfers Transfers: Sit to Stand;Stand to Sit;Stand Pivot Transfers Sit to Stand: Independent with assistive device Stand to Sit: Independent with assistive device Stand Pivot Transfers: Independent with assistive device Stand Pivot Transfer Details: Verbal cues for precautions/safety Transfer (Assistive device): Rolling walker Locomotion : Gait Ambulation: Yes Gait Assistance: Supervision/Verbal cueing Gait Distance (Feet): 120 Feet Assistive device: Rolling walker Gait Assistance Details: Other (comment) (verbal cues for posture and for u-turns to maintain positioning of LLE to maintain hip precautions.) Gait Gait: Yes Gait Pattern: Impaired Gait Pattern: Step-to pattern;Step-through pattern;Decreased step length - left;Antalgic Gait velocity: decreased Stairs / Additional Locomotion Stairs: Yes Stairs Assistance: Supervision/Verbal cueing Stair Management Technique: Two rails Number of Stairs: 8 Height of Stairs: 6 Ramp: Supervision/Verbal cueing Curb: Supervision/Verbal cueing Wheelchair Mobility Wheelchair Mobility: No  Trunk/Postural Assessment : Cervical Assessment Cervical Assessment: Within Functional Limits Thoracic Assessment Thoracic Assessment: Within Functional Limits Lumbar Assessment Lumbar Assessment: Within Functional Limits Postural Control Righting Reactions: Decreased/delayed Protective Responses: Decreased/delayed  Balance: Balance Balance Assessed: Yes Static Sitting Balance Static Sitting - Balance Support: Feet supported Static Sitting - Level of  Assistance: 7: Independent Dynamic Sitting Balance Dynamic Sitting - Balance Support: Feet supported;During functional activity Dynamic Sitting - Level of Assistance: 7: Independent Static Standing Balance Static  Standing - Balance Support: During functional activity;Bilateral upper extremity supported Static Standing - Level of Assistance: 6: Modified independent (Device/Increase time) Dynamic Standing Balance Dynamic Standing - Balance Support: During functional activity;Bilateral upper extremity supported Dynamic Standing - Level of Assistance: 5: Stand by assistance Exercises:   Other Treatments:      Therapy/Group: Individual Therapy  Akyia Borelli P Chelesa Weingartner 03/07/2024, 12:54 PM

## 2024-03-07 NOTE — Progress Notes (Signed)
 Occupational Therapy Discharge Summary  Patient Details  Name: Jonathon Anderson MRN: [id number] Date of Birth: 1956/06/02  Date of Discharge from OT service:March 07, 2024  Session 1 Today's Date: 03/07/2024 OT Individual Time: 9268-9169 OT Individual Time Calculation (min): 59 min   Session 2 Today's Date: 03/07/2024 OT Individual Time: 1300-1330 OT Individual Time Calculation (min): 30 min    Patient has met 6 of 6 long term goals due to improved activity tolerance, improved balance, postural control, and ability to compensate for deficits.  Patient to discharge at overall Supervision- mod I level.  Patient's care partner is independent to provide the necessary physical assistance at discharge. Family education has been completed with his sister. Jonathon Anderson has made great progress in CIR and is ready for d/c home with his family's assist.   Recommendation:  Patient will benefit from ongoing skilled OT services in home health setting to continue to advance functional skills in the area of BADL and iADL.  Equipment: Family buying TTB privately  Reasons for discharge: treatment goals met and discharge from hospital  Patient/family agrees with progress made and goals achieved: Yes  Skilled OT Intervention Session 1 Pt received sitting EOB with no c/o pain, agreeable to OT session. Offered ADLs and he declined. He stood from the EOB with mod I and used the RW to complete 125 ft of functional mobility to the ADL apt with (S). Min cueing for RW management. He completed furniture transfer to the couch with (S). He completed tub transfer using a TTB with min cueing and (S). Pt required use of rest breaks throughout session for recovery, as well as to support safety and prevent overexertion. During breaks, OT monitored recovery time to assess endurance and response to exertion. He was taken to the therapy gym. Pt completed the BUE ergometer to challenge BUE strength and endurance needed to complete  ADLs and IADLs with the highest level of independence. Pt completed 10 min total. He then completed standing level activity at the BITS to challenge static standing balance, reduce UE reliance on the RW ,and functional activity tolerance for carryover to standing level ADLs. He lasted about 1 min 40 seconds before needing to sit. He returned to his room and was left sitting up in the w/c with all needs met.   Session 2 Pt received in recliner with 6/10 L hip pain, reporting he is premedicated and agreeable to session. He completed a stand pivot to the w/c with mod I using the RW. Pt was taken via w/c to the therapy gym for time management. He completed B shoulder AROM with the L in a gravity eliminated plane- towel slides with a 1.5 wrist weight 2x15 repetitions. He was able to complete RUE shoulder flex with thumbs up positioning, no weight 2x10 repetitions. He was able to complete self AAROM B shoulder flex to 90 degrees. He returned to his room and was left sitting up with all needs met.    OT Discharge Precautions/Restrictions  Precautions Precautions: Posterior Hip Other Brace: abductor brace Restrictions Weight Bearing Restrictions Per Provider Order: Yes LLE Weight Bearing Per Provider Order: Weight bearing as tolerated Other Position/Activity Restrictions: With brace in place.   Vital Signs Therapy Vitals Temp: 97.7 F (36.5 C) Pulse Rate: 81 Resp: 18 BP: 125/65 Patient Position (if appropriate): Sitting Oxygen Therapy SpO2: 100 % O2 Device: Room Air Pain Pain Assessment Pain Scale: 0-10 Pain Score: 5  Pain Location: Hip Pain Intervention(s): Medication (See eMAR) ADL ADL Eating: Independent  Where Assessed-Eating: Bed level, Wheelchair Grooming: Modified independent Where Assessed-Grooming: Sitting at sink Upper Body Bathing: Setup Where Assessed-Upper Body Bathing: Edge of bed Lower Body Bathing: Supervision/safety Where Assessed-Lower Body Bathing: Edge of  bed Upper Body Dressing: Setup Where Assessed-Upper Body Dressing: Edge of bed Lower Body Dressing: Supervision/safety Where Assessed-Lower Body Dressing: Edge of bed Toileting: Supervision/safety Where Assessed-Toileting: Toilet, Bedside Commode Toilet Transfer: Distant supervision Toilet Transfer Method: Ambulating Tub/Shower Transfer: Close supervison Web designer Method: Ship broker: Insurance underwriter: Unable to assess Vision Baseline Vision/History: 1 Wears glasses Patient Visual Report: No change from baseline Vision Assessment?: No apparent visual deficits Perception  Perception: Within Functional Limits Praxis Praxis: WFL Cognition Cognition Overall Cognitive Status: Within Functional Limits for tasks assessed Arousal/Alertness: Awake/alert Orientation Level: Person;Place;Situation Person: Oriented Place: Oriented Situation: Oriented Memory: Appears intact Awareness: Appears intact Problem Solving: Appears intact Safety/Judgment: Appears intact Brief Interview for Mental Status (BIMS) Repetition of Three Words (First Attempt): 3 Temporal Orientation: Year: Correct Temporal Orientation: Month: Accurate within 5 days Temporal Orientation: Day: Correct Recall: Sock: Yes, no cue required Recall: Blue: Yes, no cue required Recall: Bed: Yes, no cue required BIMS Summary Score: 15 Sensation Sensation Light Touch: Impaired Detail Central sensation comments: Some numbness along wound vac Light Touch Impaired Details: Impaired LLE Hot/Cold: Appears Intact Coordination Gross Motor Movements are Fluid and Coordinated: No Fine Motor Movements are Fluid and Coordinated: Yes Coordination and Movement Description: LLE pain guarding Motor  Motor Motor: Within Functional Limits Mobility  Bed Mobility Bed Mobility: Supine to Sit;Sit to Supine Supine to Sit: Supervision/Verbal cueing Sit to Supine:  Supervision/Verbal cueing Transfers Sit to Stand: Independent with assistive device Stand to Sit: Independent with assistive device  Trunk/Postural Assessment  Cervical Assessment Cervical Assessment: Within Functional Limits Thoracic Assessment Thoracic Assessment: Within Functional Limits Lumbar Assessment Lumbar Assessment: Within Functional Limits Postural Control Postural Control: Deficits on evaluation Righting Reactions: Decreased/delayed Protective Responses: Decreased/delayed  Balance Balance Balance Assessed: Yes Static Sitting Balance Static Sitting - Balance Support: Feet supported Static Sitting - Level of Assistance: 7: Independent Dynamic Sitting Balance Dynamic Sitting - Balance Support: Feet supported Dynamic Sitting - Level of Assistance: 7: Independent Static Standing Balance Static Standing - Balance Support: During functional activity;Bilateral upper extremity supported Static Standing - Level of Assistance: 6: Modified independent (Device/Increase time) Dynamic Standing Balance Dynamic Standing - Balance Support: During functional activity;Bilateral upper extremity supported Dynamic Standing - Level of Assistance: 5: Stand by assistance Extremity/Trunk Assessment RUE Assessment RUE Assessment: Exceptions to [facility] Active Range of Motion (AROM) Comments: 100 degrees General Strength Comments: 3-/5 LUE Assessment LUE Assessment: Exceptions to [facility] Active Range of Motion (AROM) Comments: 45 degrees shoulder flexin/abduction LUE Body System: Kemp Nena VEAR Nicholaus 03/07/2024, 8:14 AM

## 2024-03-07 NOTE — Progress Notes (Signed)
 Physical Therapy Discharge Summary  Patient Details  Name: Jonathon Anderson MRN: [id number] Date of Birth: 04-12-1956  Date of Discharge from PT service:March 07, 2024  Today's Date: 03/07/2024 PT Individual Time: 1402-1430 PT Individual Time Calculation (min): 28 min    Patient has met 8 of 9 long term goals due to improved activity tolerance, increased strength, decreased pain, and improved coordination.  Patient to discharge at an ambulatory level Supervision.   Patient's care partner is independent to provide the necessary physical assistance at discharge.  Reasons goals not met: Pt has met 8/9  LTG but only amb up to 120' w/ RW and supervision.  Pt able to amb household distance consistently (50').  Pt does assist LLE w/ UES in/out of bed and car.  Recommendation:  Patient will benefit from ongoing skilled PT services in home health setting to continue to advance safe functional mobility, address ongoing impairments in strength and pain, and minimize fall risk.  Equipment: Pt has RW at home.  Reasons for discharge: treatment goals met and discharge from hospital  Patient/family agrees with progress made and goals achieved: Yes  PT Discharge Precautions/Restrictions Precautions Precautions: Posterior Hip Recall of Precautions/Restrictions: Intact Precaution/Restrictions Comments: Able to recall precautions Required Braces or Orthoses: Other Brace Other Brace: abductor brace Restrictions Weight Bearing Restrictions Per Provider Order: Yes LLE Weight Bearing Per Provider Order: Weight bearing as tolerated Other Position/Activity Restrictions: With brace in place. Vital Signs   Pain Pain Assessment Pain Scale: 0-10 Pain Score: 5  Pain Location: Knee Pain Orientation: Left Pain Descriptors / Indicators: Aching Pain Onset: On-going Pain Intervention(s): Medication (See eMAR);Other (Comment) (voltaren  gel) Pain Interference Pain Interference Pain Effect on Sleep: 1.  Rarely or not at all Pain Interference with Therapy Activities: 1. Rarely or not at all Pain Interference with Day-to-Day Activities: 1. Rarely or not at all Vision/Perception     Cognition Overall Cognitive Status: Within Functional Limits for tasks assessed Arousal/Alertness: Awake/alert Awareness: Appears intact Problem Solving: Appears intact Safety/Judgment: Appears intact Sensation Sensation Light Touch: Impaired Detail Light Touch Impaired Details: Impaired LLE Coordination Gross Motor Movements are Fluid and Coordinated: No Coordination and Movement Description: LLE pain guarding Heel Shin Test: NT Motor  Motor Motor: Within Functional Limits Motor - Skilled Clinical Observations: decreased L Hip strength 2/2 pain/disuse.  Mobility Bed Mobility Bed Mobility: Supine to Sit;Sit to Supine Supine to Sit: Supervision/Verbal cueing (pt using UES for LLE movement, supervision for maintaining hip precautions.) Sit to Supine: Supervision/Verbal cueing Transfers Transfers: Sit to Stand;Stand to Sit;Stand Pivot Transfers Sit to Stand: Independent with assistive device Stand to Sit: Independent with assistive device Stand Pivot Transfers: Independent with assistive device Stand Pivot Transfer Details: Verbal cues for precautions/safety Transfer (Assistive device): Rolling walker Locomotion  Gait Ambulation: Yes Gait Assistance: Supervision/Verbal cueing Gait Distance (Feet): 120 Feet Assistive device: Rolling walker Gait Assistance Details: Other (comment) (verbal cues for posture and for u-turns to maintain positioning of LLE to maintain hip precautions.) Gait Gait: Yes Gait Pattern: Impaired Gait Pattern: Step-to pattern;Step-through pattern;Decreased step length - left;Antalgic Gait velocity: decreased Stairs / Additional Locomotion Stairs: Yes Stairs Assistance: Supervision/Verbal cueing Stair Management Technique: Two rails Number of Stairs: 8 Height of Stairs:  6 Ramp: Supervision/Verbal cueing Curb: Supervision/Verbal cueing Pick up small object from the floor assist level: Supervision/Verbal cueing Pick up small object from the floor assistive device: reacher, pt will have reacher at home. Wheelchair Mobility Wheelchair Mobility: No  Trunk/Postural Assessment  Cervical Assessment Cervical Assessment: Within Functional  Limits Thoracic Assessment Thoracic Assessment: Within Functional Limits Lumbar Assessment Lumbar Assessment: Within Functional Limits Postural Control Righting Reactions: Decreased/delayed Protective Responses: Decreased/delayed  Balance Balance Balance Assessed: Yes Static Sitting Balance Static Sitting - Balance Support: Feet supported Static Sitting - Level of Assistance: 7: Independent Dynamic Sitting Balance Dynamic Sitting - Balance Support: Feet supported;During functional activity Dynamic Sitting - Level of Assistance: 7: Independent Static Standing Balance Static Standing - Balance Support: During functional activity;Bilateral upper extremity supported Static Standing - Level of Assistance: 6: Modified independent (Device/Increase time) Dynamic Standing Balance Dynamic Standing - Balance Support: During functional activity;Bilateral upper extremity supported Dynamic Standing - Level of Assistance: 5: Stand by assistance Extremity Assessment      RLE Assessment RLE Assessment: Within Functional Limits LLE Assessment LLE Assessment: Exceptions to [facility] General Strength Comments: Knee and ankle at least 3+/5, hip not formally tested 2/2 pain   Jonathon Anderson 03/07/2024, 1:01 PM

## 2024-03-07 NOTE — Progress Notes (Signed)
 PROGRESS NOTE   Subjective/Complaints:  No events overnight.  No acute complaints.  Ongoing swelling in the left hip - not increased, with some soreness but is overall feeling better. No concerns about going home.  Discussed Hgb downtrend; OP follow up. He is asymptomatic.   ROS: Denies insomnia, N/V, abdominal pain, constipation, diarrhea, SOB, cough, chest pain, new weakness or paraesthesias.   + Left proximal thigh pain ongoing, stable  Objective:   No results found.  Recent Labs    03/06/24 0447  WBC 7.7  HGB 7.7*  HCT 24.7*  PLT 343    Recent Labs    03/06/24 0447  NA 132*  K 4.3  CL 97*  CO2 27  GLUCOSE 101*  BUN <5*  CREATININE 0.46*  CALCIUM  8.0*     Intake/Output Summary (Last 24 hours) at 03/07/2024 1541 Last data filed at 03/07/2024 1200 Gross per 24 hour  Intake 240 ml  Output 1275 ml  Net -1035 ml        Physical Exam: Vital Signs Blood pressure (!) 107/59, pulse 80, temperature 98.7 F (37.1 C), temperature source Oral, resp. rate 18, height 5' 8 (1.727 m), weight 85.5 kg, SpO2 98%.  General: No apparent distress.  Sitting up in bed.  HEENT: Head is normocephalic, atraumatic, sclera anicteric, oral mucosa moist Heart:RRR, no murmurs rubs or gallops. Chest: CTA bilaterally without wheezes, rales, or rhonchi; no distress Abdomen: Soft, non-tender, non--distended, bowel sounds positive  Extremities:  1-2+ edema through left hip and thigh--unchanged Psych: Patient is a very pleasant Skin: No breakdown noted on visible portion.  Post-surgical dressing over L hip, no apparent drainage - stable 8/15  Neuro:     Mental Status: AAOx3 Speech/Languate: Fluent, follows simple commands CRANIAL NERVES: 2-12 grossly intact     MOTOR: 5 out of 5 bilateral upper and lower extremities with exception of 4-5 left hip flexion   SENSORY: Normal to touch all 4 extremities   Coordination: No  ataxia or dysmetria noted  MSK: Left lower extremity tightness/edema, mild ttp around thigh/hip --  improving   Assessment/Plan: 1. Functional deficits which require 3+ hours per day of interdisciplinary therapy in a comprehensive inpatient rehab setting. Physiatrist is providing close team supervision and 24 hour management of active medical problems listed below. Physiatrist and rehab team continue to assess barriers to discharge/monitor patient progress toward functional and medical goals  Care Tool:  Bathing    Body parts bathed by patient: Right arm, Left arm, Chest, Abdomen, Front perineal area, Buttocks, Right upper leg, Left upper leg, Face, Right lower leg, Left lower leg   Body parts bathed by helper: Right lower leg, Left lower leg     Bathing assist Assist Level: Independent with assistive device     Upper Body Dressing/Undressing Upper body dressing   What is the patient wearing?: Pull over shirt    Upper body assist Assist Level: Independent with assistive device    Lower Body Dressing/Undressing Lower body dressing      What is the patient wearing?: Underwear/pull up, Pants     Lower body assist Assist for lower body dressing: Supervision/Verbal cueing     Toileting Toileting  Toileting assist Assist for toileting: Supervision/Verbal cueing     Transfers Chair/bed transfer  Transfers assist     Chair/bed transfer assist level: Independent with assistive device Chair/bed transfer assistive device: Arboriculturist assist      Assist level: Supervision/Verbal cueing Assistive device: Walker-rolling Max distance: 50 (c/o pain and fatigue LLE.)   Walk 10 feet activity   Assist     Assist level: Supervision/Verbal cueing Assistive device: Walker-rolling   Walk 50 feet activity   Assist    Assist level: Supervision/Verbal cueing Assistive device: Walker-rolling    Walk 150 feet  activity   Assist    Assist level: Contact Guard/Touching assist Assistive device: Walker-rolling    Walk 10 feet on uneven surface  activity   Assist     Assist level: Supervision/Verbal cueing Assistive device: Walker-rolling   Wheelchair     Assist Is the patient using a wheelchair?: Yes Type of Wheelchair: Manual    Wheelchair assist level: Dependent - Patient 0% Max wheelchair distance: 150'    Wheelchair 50 feet with 2 turns activity    Assist        Assist Level: Dependent - Patient 0%   Wheelchair 150 feet activity     Assist      Assist Level: Dependent - Patient 0%   Blood pressure (!) 107/59, pulse 80, temperature 98.7 F (37.1 C), temperature source Oral, resp. rate 18, height 5' 8 (1.727 m), weight 85.5 kg, SpO2 98%.  1. Functional deficits secondary to recurrent dislocation left hip s/p closed reduction and irrigation, debridement.  Hip abduction brace when out of bed, pillow in bed.  WBAT LLE with strict posterior hip precautions             -patient may not shower             -ELOS/Goals: Mod I Pt/Ot 10-12 days--03/08/24             - Stable to continue CIR   - 8/12: Doing well Min A to CGA with OT; downgrading goals to SPV. 90 ft CGA with RW; CGA transfers. Limited by endurance and L hip pain. Poor compliance with brace, safety precautions.   The patient is medically ready for discharge to home 8/16 and will not need follow-up with [facility]. In addition, they will need to follow up with their PCP, Orthopedics.   2.  Antithrombotics: -DVT/anticoagulation:  Mechanical: Sequential compression devices, below knee Bilateral lower extremities Pharmaceutical: Lovenox   Confirmed initiation with Dr. Leandrew below              -antiplatelet therapy: Aspirin  81 mg held due bleeding risk              - Venous Dopplers ordered--negative   - 8/14: ASA 81 mg for DVT ppx at discharge per ortho, 28d total  3. Pain Management: Oxycodone , Tylenol   and Robaxin  as needed  - Pain well-controlled on current regimen  - 8-8: Switch as needed oxycodone  to tramadol  50 mg every 6 hours as needed per patient request  - 8/9 patient used tramadol  twice last night, continue current regimen  - 8/10 used 1 dose of as needed tramadol  this morning, pain overall controlled  8-13: Schedule Robaxin  500 mg 3 times daily.  Using as needed tramadol  1-2 times daily, will likely need short course at discharge as well. - 8/15: Doing better with muscle relaxers  4. Mood/Behavior/Sleep: LCSW to follow for evaluation and support  when available.              -antipsychotic agents: N/A 5. Neuropsych/cognition: This patient is capable of making decisions on his own behalf. 6. Skin/Wound Care: routine pressure relief measures monitor wound for signs of infection              -Prevena incisional wound VAC changed 8/5              -Post op Abx cefadroxil  500 mg BID for 7 days ends 8/13.   - 8/11: Will discuss vac DC w/ Dr. Derrel his office, will remove VAC themselves prior to discharge from inpatient rehab, likely at the end of this week--removed 8/13 - 8/12: masd on buttocks; managing conservatively with barrier  7. Fluids/Electrolytes/Nutrition: monitor I/O and daily weights--recheck Labs in a.m.              -Regular diet-encourage fluid intake +Ensure supplement - 8-11: Labs stable.  8.  Acute on chronic anemia: hgb 8.8/28.6  monitor  PT/INR recheck in a.m  nursing notified to collect daily FOBT  - Continue ferrous sulfate  and folic acid   - 8/7: Hemoglobin stable.  Change FOBT to once--negative on 8/7   - FOBT negative   - 8/14: HgB 7.7; mild decrease, no apparent bleeding, follow up with PCP as OP  9. HTN:  Amlodipine  and metoprolol . BP stable  -8 BP controlled continue current regimen    03/07/2024    1:51 PM 03/07/2024    4:20 AM 03/06/2024    8:44 PM  Vitals with BMI  Weight  188 lbs 8 oz   BMI  28.67   Systolic 107 125 885  Diastolic 59 65  61  Pulse 80 81 84    10.HLD: Atorvastatin   11. Chronic nausea/vomiting: stable -GI has signed off--continue Protonix  40 mg BID   - Transition to p.o. per patient request  - No events since admission rehab  12. L shoulder weakness: pt reports this began around time of hip injury, Will check shoulder xray  - 8-7: X-ray with left AC joint arthritis; no acute fracture--no discomfort on range of motion, monitor   13.  Mild hyponatremia  - Stable at 134-133; patient asymptomatic; monitor  14.  Constipation. -constipation: 1 week since LBM -bowel program Miralax  and Senokot scheduled, patient  agreeable to enema if unsuccessful today. - Medium bowel movement 8-7 - Last bowel meant 8/13, liquid, large.  DC Senokot, move MiraLAX  to daily as needed LOS: 9 days A FACE TO FACE EVALUATION WAS PERFORMED  Jonathon Anderson Anderson 03/07/2024, 3:41 PM

## 2024-03-07 NOTE — Progress Notes (Signed)
 Physical Therapy Session Note  Patient Details  Name: Jonathon Anderson MRN: [id number] Date of Birth: [birth year]  Today's Date: 03/07/2024 PT Individual Time: 1124-1205 PT Individual Time Calculation (min): 41 min   Short Term Goals: Week 1:  PT Short Term Goal 1 (Week 1): STGs = LTGs  Skilled Therapeutic Interventions/Progress Updates:    Pt presents in room seated in recliner, agreeable to PT. Pt reporting pain in L hip and knee as well as some pain in low back. Session focused on NMR to complete as HEP at DC as well as therapeutic activities to promote improved transfer mechanics and provided education on precautions and using hip abduction brace at home with pt verbalizing understanding. Pt transported to main gym and set up at standing table to simulate kitchen counter at home. Pt completes NMR in standing with BUE support on standing table, completes one set of the following exercises with cues for activity modification for desired muscular activation and to decrease pain including: Access Code: [email or web address] URL: [email or web address] Date: 03/07/2024 Prepared by: [name]  Exercises - Standing March with Counter Support  - 1 x daily - 7 x weekly - 3 sets - 10 reps - Heel Raises with Counter Support  - 1 x daily - 7 x weekly - 3 sets - 10 reps - Standing Gluteal Sets  - 1 x daily - 7 x weekly - 3 sets - 10 reps - Mini Squat with Counter Support  - 1 x daily - 7 x weekly - 3 sets - 10 reps - Standing Hip Abduction with Counter Support  - 1 x daily - 7 x weekly - 3 sets - 10 reps - Standing Knee Flexion with Counter Support  - 1 x daily - 7 x weekly - 3 sets - 10 reps  Pt reports urgency of bladder, returned to room quickly and pt manages voiding bladder continently from sitting position with urinal. Pt completes stand step transfer with RW supervision to recliner where he remains with all needs within reach, call light in place at end of sessoin.  Therapy  Documentation Precautions:  Precautions Precautions: Posterior Hip Recall of Precautions/Restrictions: Intact Precaution/Restrictions Comments: Able to recall precautions Required Braces or Orthoses: Other Brace Other Brace: abductor brace Restrictions Weight Bearing Restrictions Per Provider Order: Yes LLE Weight Bearing Per Provider Order: Weight bearing as tolerated Other Position/Activity Restrictions: With brace in place.    Therapy/Group: Individual Therapy  [name] PT, DPT 03/07/2024, 12:19 PM

## 2024-03-07 NOTE — Progress Notes (Signed)
 Inpatient Rehabilitation Discharge Medication Review by a Pharmacist   A complete drug regimen review was completed for this patient to identify any potential clinically significant medication issues.   High Risk Drug Classes Is patient taking? Indication by Medication  Antipsychotic No   Anticoagulant No    Antibiotic No   Opioid Yes Tramadol - pain  Antiplatelet Yes  Aspirin  81mg  BID - DVT prophylaxis per ortho (x 18 days) to complete total 28 days VTE ppx therapy including Lovenox  given in CIR encounter.  Hypoglycemics/insulin No    Vasoactive Medication Yes Amlodipine , metoprolol  - HTN  Chemotherapy No    Other Yes Acetaminophen , diclofenac  gel,  Biofreeze EX topical - pain Atorvastatin  - HLD Ferrous sulfate , folic acid - supplement Methocarbamol  - muscle spasms Miralax , magnesium  hydroxide susp.- constipation Pantoprazole  - Reflux  Trazodone  prn sleep        Type of Medication Issue Identified Description of Issue Recommendation(s)  Drug Interaction(s) (clinically significant)        Duplicate Therapy        Allergy        No Medication Administration End Date        Incorrect Dose        Additional Drug Therapy Needed        Significant med changes from prior encounter (inform family/care partners about these prior to discharge). Cylcobenzaprine discontinued, changed to Methocarbamol . Celebrex  and ondansetron  discontinued.  Cefadroxil  therapy completed. Communicate to patient /family/ caregiver prior to discharge.    Other            Clinically significant medication issues were identified that warrant physician communication and completion of prescribed/recommended actions by midnight of the next day:  No   Name of provider notified for urgent issues identified:    Provider Method of Notification:       Pharmacist comments: None   Time spent performing this drug regimen review (minutes):  30   Levorn Gaskins, [place] Clinical Pharmacist 03/07/2024 11:14 AM

## 2024-03-08 ENCOUNTER — Other Ambulatory Visit (HOSPITAL_COMMUNITY): Payer: Self-pay

## 2024-03-08 DIAGNOSIS — K5901 Slow transit constipation: Secondary | ICD-10-CM

## 2024-03-08 NOTE — Progress Notes (Signed)
 PROGRESS NOTE   Subjective/Complaints:  Pt doing well, slept well and ready to go home. Pain manageable. LBM yesterday per pt, 8/14 per documentation. Urinating fine. No other complaints or concerns.   ROS: Denies insomnia, N/V, abdominal pain, constipation, diarrhea, SOB, cough, chest pain, new weakness or paraesthesias.   + Left proximal thigh pain ongoing, stable  Objective:   No results found.  Recent Labs    03/06/24 0447  WBC 7.7  HGB 7.7*  HCT 24.7*  PLT 343    Recent Labs    03/06/24 0447  NA 132*  K 4.3  CL 97*  CO2 27  GLUCOSE 101*  BUN <5*  CREATININE 0.46*  CALCIUM  8.0*     Intake/Output Summary (Last 24 hours) at 03/08/2024 1014 Last data filed at 03/08/2024 0754 Gross per 24 hour  Intake 540 ml  Output 850 ml  Net -310 ml        Physical Exam: Vital Signs Blood pressure 115/64, pulse 78, temperature 98.2 F (36.8 C), temperature source Oral, resp. rate 19, height 5' 8 (1.727 m), weight 86.7 kg, SpO2 100%.  General: No apparent distress.  Sitting up in bed.  HEENT: Head is normocephalic, atraumatic, sclera anicteric, oral mucosa moist Heart:RRR, no murmurs rubs or gallops. Chest: CTA bilaterally without wheezes, rales, or rhonchi; no distress Abdomen: Soft, non-tender, non-distended, bowel sounds positive  Extremities:  1+ edema through left hip and thigh--unchanged, ACE wrap on L knee/thigh Psych: Patient is a very pleasant Skin: No breakdown noted on visible portion.  Post-surgical dressing over L hip, no apparent drainage - stable 8/15-- not reassessed 8/16  PRIOR EXAMS: Neuro:     Mental Status: AAOx3 Speech/Languate: Fluent, follows simple commands CRANIAL NERVES: 2-12 grossly intact     MOTOR: 5 out of 5 bilateral upper and lower extremities with exception of 4-5 left hip flexion   SENSORY: Normal to touch all 4 extremities   Coordination: No ataxia or dysmetria  noted  MSK: Left lower extremity tightness/edema, mild ttp around thigh/hip --  improving   Assessment/Plan: 1. Functional deficits which require 3+ hours per day of interdisciplinary therapy in a comprehensive inpatient rehab setting. Physiatrist is providing close team supervision and 24 hour management of active medical problems listed below. Physiatrist and rehab team continue to assess barriers to discharge/monitor patient progress toward functional and medical goals  Care Tool:  Bathing    Body parts bathed by patient: Right arm, Left arm, Chest, Abdomen, Front perineal area, Buttocks, Right upper leg, Left upper leg, Face, Right lower leg, Left lower leg   Body parts bathed by helper: Right lower leg, Left lower leg     Bathing assist Assist Level: Independent with assistive device     Upper Body Dressing/Undressing Upper body dressing   What is the patient wearing?: Pull over shirt    Upper body assist Assist Level: Independent with assistive device    Lower Body Dressing/Undressing Lower body dressing      What is the patient wearing?: Underwear/pull up, Pants     Lower body assist Assist for lower body dressing: Supervision/Verbal cueing     Toileting Toileting    Toileting assist  Assist for toileting: Supervision/Verbal cueing     Transfers Chair/bed transfer  Transfers assist     Chair/bed transfer assist level: Independent with assistive device Chair/bed transfer assistive device: Arboriculturist assist      Assist level: Supervision/Verbal cueing Assistive device: Walker-rolling Max distance: 50 (c/o pain and fatigue LLE.)   Walk 10 feet activity   Assist     Assist level: Supervision/Verbal cueing Assistive device: Walker-rolling   Walk 50 feet activity   Assist    Assist level: Supervision/Verbal cueing Assistive device: Walker-rolling    Walk 150 feet activity   Assist    Assist  level: Contact Guard/Touching assist Assistive device: Walker-rolling    Walk 10 feet on uneven surface  activity   Assist     Assist level: Supervision/Verbal cueing Assistive device: Walker-rolling   Wheelchair     Assist Is the patient using a wheelchair?: Yes Type of Wheelchair: Manual    Wheelchair assist level: Dependent - Patient 0% Max wheelchair distance: 150'    Wheelchair 50 feet with 2 turns activity    Assist        Assist Level: Dependent - Patient 0%   Wheelchair 150 feet activity     Assist      Assist Level: Dependent - Patient 0%   Blood pressure 115/64, pulse 78, temperature 98.2 F (36.8 C), temperature source Oral, resp. rate 19, height 5' 8 (1.727 m), weight 86.7 kg, SpO2 100%.  Medical Problem List and Plan: 1. Functional deficits secondary to recurrent dislocation left hip s/p closed reduction and irrigation, debridement.  Hip abduction brace when out of bed, pillow in bed.  WBAT LLE with strict posterior hip precautions             -patient may not shower             -ELOS/Goals: Mod I Pt/Ot 10-12 days--03/08/24             - Stable to continue CIR   - 8/12: Doing well Min A to CGA with OT; downgrading goals to SPV. 90 ft CGA with RW; CGA transfers. Limited by endurance and L hip pain. Poor compliance with brace, safety precautions.   -03/08/24 d/c instructions reviewed, stable for d/c  The patient is medically ready for discharge to home 8/16 and will not need follow-up with [facility] PM[facility]. In addition, they will need to follow up with their PCP, Orthopedics.   2.  Antithrombotics: -DVT/anticoagulation:  Mechanical: Sequential compression devices, below knee Bilateral lower extremities Pharmaceutical: Lovenox   Confirmed initiation with Dr. Leandrew below              -antiplatelet therapy: Aspirin  81 mg held due bleeding risk              - Venous Dopplers ordered--negative   - 8/14: ASA 81 mg for DVT ppx at discharge per ortho,  28d total  3. Pain Management: Oxycodone , Tylenol  and Robaxin  as needed  - Pain well-controlled on current regimen  - 8-8: Switch as needed oxycodone  to tramadol  50 mg every 6 hours as needed per patient request  - 8/9 patient used tramadol  twice last night, continue current regimen  - 8/10 used 1 dose of as needed tramadol  this morning, pain overall controlled  8-13: Schedule Robaxin  500 mg 3 times daily.  Using as needed tramadol  1-2 times daily, will likely need short course at discharge as well. - 8/15: Doing better with muscle relaxers  4. Mood/Behavior/Sleep: LCSW to follow for evaluation and support when available.              -antipsychotic agents: N/A 5. Neuropsych/cognition: This patient is capable of making decisions on his own behalf. 6. Skin/Wound Care: routine pressure relief measures monitor wound for signs of infection              -Prevena incisional wound VAC changed 8/5              -Post op Abx cefadroxil  500 mg BID for 7 days ends 8/13.   - 8/11: Will discuss vac DC w/ Dr. Derrel his office, will remove VAC themselves prior to discharge from inpatient rehab, likely at the end of this week--removed 8/13 - 8/12: masd on buttocks; managing conservatively with barrier  7. Fluids/Electrolytes/Nutrition: monitor I/O and daily weights--recheck Labs in a.m.              -Regular diet-encourage fluid intake +Ensure supplement - 8-11: Labs stable.  8.  Acute on chronic anemia: hgb 8.8/28.6  monitor  PT/INR recheck in a.m  nursing notified to collect daily FOBT  - Continue ferrous sulfate  and folic acid   - 8/7: Hemoglobin stable.  Change FOBT to once--negative on 8/7   - FOBT negative   - 8/14: HgB 7.7; mild decrease, no apparent bleeding, follow up with PCP as OP  9. HTN:  Amlodipine  and metoprolol . BP stable  -03/08/24 BP controlled continue current regimen and f/up outpatient    03/08/2024    3:58 AM 03/07/2024    7:37 PM 03/07/2024    1:51 PM  Vitals with BMI   Weight 191 lbs 2 oz    BMI 29.07    Systolic 115 108 892  Diastolic 64 60 59  Pulse 78 82 80    10.HLD: Atorvastatin   11. Chronic nausea/vomiting: stable -GI has signed off--continue Protonix  40 mg BID   - Transition to p.o. per patient request  - No events since admission rehab  12. L shoulder weakness: pt reports this began around time of hip injury, Will check shoulder xray  - 8-7: X-ray with left AC joint arthritis; no acute fracture--no discomfort on range of motion, monitor   13.  Mild hyponatremia  - Stable at 134-133; patient asymptomatic; monitor  14.  Constipation. -constipation: 1 week since LBM -bowel program Miralax  and Senokot scheduled, patient  agreeable to enema if unsuccessful today. - Medium bowel movement 8-7 - Last bowel meant 8/13, liquid, large.  DC Senokot, move MiraLAX  to daily as needed -03/08/24 LBM yesterday per pt, 8/14 per documentation; monitor outpatient LOS: 10 days A FACE TO FACE EVALUATION WAS PERFORMED  [address] 03/08/2024, 10:14 AM

## 2024-03-10 NOTE — Progress Notes (Signed)
 Inpatient Rehabilitation Care Coordinator Discharge Note   Patient Details  Name: KAHMARI KOLLER MRN: [id number] Date of Birth: 30-Aug-1955   Discharge location: d/c to home with his sister  Length of Stay: 9 days  Discharge activity level: Supervision  Home/community participation: Limited  Patient response un:Yzjouy Literacy - How often do you need to have someone help you when you read instructions, pamphlets, or other written material from your doctor or pharmacy?: Rarely  Patient response un:Dnrpjo Isolation - How often do you feel lonely or isolated from those around you?: Rarely  Services provided included: MD, RD, PT, OT, RN, CM, TR, Pharmacy, Neuropsych, SW  Financial Services:  Field seismologist Utilized: Medicare    Choices offered to/list presented to: patient  Follow-up services arranged:  Home Health Home Health Agency: [facility]         Patient response to transportation need: Is the patient able to respond to transportation needs?: Yes In the past 12 months, has lack of transportation kept you from medical appointments or from getting medications?: No In the past 12 months, has lack of transportation kept you from meetings, work, or from getting things needed for daily living?: No   Patient/Family verbalized understanding of follow-up arrangements:  Yes  Individual responsible for coordination of the follow-up plan: Contact pt  Confirmed correct DME delivered: Graeme DELENA Jude 03/10/2024    Comments (or additional information):fam edu completed  Summary of Stay    Date/Time Discharge Planning CSW  03/03/24 1534 Pt will d/c to home with his sister who will provide 24/7 care. Pt needs to be as independent as possible. Support from other siblings. Fam edu on Wed 9am-12pm. SW will confirm there are no barriers to discharge. AAC       Delance Weide A Jude

## 2024-03-11 DIAGNOSIS — T84021A Dislocation of internal left hip prosthesis, initial encounter: Secondary | ICD-10-CM | POA: Diagnosis not present

## 2024-03-11 DIAGNOSIS — E785 Hyperlipidemia, unspecified: Secondary | ICD-10-CM | POA: Diagnosis not present

## 2024-03-11 DIAGNOSIS — D500 Iron deficiency anemia secondary to blood loss (chronic): Secondary | ICD-10-CM | POA: Diagnosis not present

## 2024-03-11 DIAGNOSIS — G4733 Obstructive sleep apnea (adult) (pediatric): Secondary | ICD-10-CM | POA: Diagnosis not present

## 2024-03-11 DIAGNOSIS — I10 Essential (primary) hypertension: Secondary | ICD-10-CM | POA: Diagnosis not present

## 2024-03-11 DIAGNOSIS — K5900 Constipation, unspecified: Secondary | ICD-10-CM | POA: Diagnosis not present

## 2024-03-13 DIAGNOSIS — S73015D Posterior dislocation of left hip, subsequent encounter: Secondary | ICD-10-CM | POA: Diagnosis not present

## 2024-03-14 DIAGNOSIS — Z96649 Presence of unspecified artificial hip joint: Secondary | ICD-10-CM | POA: Diagnosis not present

## 2024-03-14 DIAGNOSIS — D500 Iron deficiency anemia secondary to blood loss (chronic): Secondary | ICD-10-CM | POA: Diagnosis not present

## 2024-03-14 DIAGNOSIS — I10 Essential (primary) hypertension: Secondary | ICD-10-CM | POA: Diagnosis not present

## 2024-03-14 DIAGNOSIS — Z79899 Other long term (current) drug therapy: Secondary | ICD-10-CM | POA: Diagnosis not present

## 2024-03-14 DIAGNOSIS — K5900 Constipation, unspecified: Secondary | ICD-10-CM | POA: Diagnosis not present

## 2024-03-14 DIAGNOSIS — R972 Elevated prostate specific antigen [PSA]: Secondary | ICD-10-CM | POA: Diagnosis not present

## 2024-03-14 DIAGNOSIS — R791 Abnormal coagulation profile: Secondary | ICD-10-CM | POA: Diagnosis not present

## 2024-03-14 DIAGNOSIS — E871 Hypo-osmolality and hyponatremia: Secondary | ICD-10-CM | POA: Diagnosis not present

## 2024-03-14 DIAGNOSIS — T84021A Dislocation of internal left hip prosthesis, initial encounter: Secondary | ICD-10-CM | POA: Diagnosis not present

## 2024-03-14 DIAGNOSIS — T84029D Dislocation of unspecified internal joint prosthesis, subsequent encounter: Secondary | ICD-10-CM | POA: Diagnosis not present

## 2024-03-14 DIAGNOSIS — G4733 Obstructive sleep apnea (adult) (pediatric): Secondary | ICD-10-CM | POA: Diagnosis not present

## 2024-03-14 DIAGNOSIS — E785 Hyperlipidemia, unspecified: Secondary | ICD-10-CM | POA: Diagnosis not present

## 2024-03-18 DIAGNOSIS — I10 Essential (primary) hypertension: Secondary | ICD-10-CM | POA: Diagnosis not present

## 2024-03-18 DIAGNOSIS — T84021A Dislocation of internal left hip prosthesis, initial encounter: Secondary | ICD-10-CM | POA: Diagnosis not present

## 2024-03-18 DIAGNOSIS — K5900 Constipation, unspecified: Secondary | ICD-10-CM | POA: Diagnosis not present

## 2024-03-18 DIAGNOSIS — G4733 Obstructive sleep apnea (adult) (pediatric): Secondary | ICD-10-CM | POA: Diagnosis not present

## 2024-03-18 DIAGNOSIS — E785 Hyperlipidemia, unspecified: Secondary | ICD-10-CM | POA: Diagnosis not present

## 2024-03-18 DIAGNOSIS — D500 Iron deficiency anemia secondary to blood loss (chronic): Secondary | ICD-10-CM | POA: Diagnosis not present

## 2024-03-20 DIAGNOSIS — E785 Hyperlipidemia, unspecified: Secondary | ICD-10-CM | POA: Diagnosis not present

## 2024-03-20 DIAGNOSIS — K5900 Constipation, unspecified: Secondary | ICD-10-CM | POA: Diagnosis not present

## 2024-03-20 DIAGNOSIS — D500 Iron deficiency anemia secondary to blood loss (chronic): Secondary | ICD-10-CM | POA: Diagnosis not present

## 2024-03-20 DIAGNOSIS — G4733 Obstructive sleep apnea (adult) (pediatric): Secondary | ICD-10-CM | POA: Diagnosis not present

## 2024-03-20 DIAGNOSIS — I10 Essential (primary) hypertension: Secondary | ICD-10-CM | POA: Diagnosis not present

## 2024-03-20 DIAGNOSIS — T84021A Dislocation of internal left hip prosthesis, initial encounter: Secondary | ICD-10-CM | POA: Diagnosis not present

## 2024-03-21 DIAGNOSIS — D500 Iron deficiency anemia secondary to blood loss (chronic): Secondary | ICD-10-CM | POA: Diagnosis not present

## 2024-03-21 DIAGNOSIS — G4733 Obstructive sleep apnea (adult) (pediatric): Secondary | ICD-10-CM | POA: Diagnosis not present

## 2024-03-21 DIAGNOSIS — K5900 Constipation, unspecified: Secondary | ICD-10-CM | POA: Diagnosis not present

## 2024-03-21 DIAGNOSIS — T84021A Dislocation of internal left hip prosthesis, initial encounter: Secondary | ICD-10-CM | POA: Diagnosis not present

## 2024-03-21 DIAGNOSIS — I10 Essential (primary) hypertension: Secondary | ICD-10-CM | POA: Diagnosis not present

## 2024-03-21 DIAGNOSIS — E785 Hyperlipidemia, unspecified: Secondary | ICD-10-CM | POA: Diagnosis not present

## 2024-03-25 DIAGNOSIS — E538 Deficiency of other specified B group vitamins: Secondary | ICD-10-CM | POA: Diagnosis not present

## 2024-03-25 DIAGNOSIS — D500 Iron deficiency anemia secondary to blood loss (chronic): Secondary | ICD-10-CM | POA: Diagnosis not present

## 2024-03-25 DIAGNOSIS — D649 Anemia, unspecified: Secondary | ICD-10-CM | POA: Diagnosis not present

## 2024-03-25 DIAGNOSIS — K5900 Constipation, unspecified: Secondary | ICD-10-CM | POA: Diagnosis not present

## 2024-03-25 DIAGNOSIS — I700 Atherosclerosis of aorta: Secondary | ICD-10-CM | POA: Diagnosis not present

## 2024-03-25 DIAGNOSIS — G4733 Obstructive sleep apnea (adult) (pediatric): Secondary | ICD-10-CM | POA: Diagnosis not present

## 2024-03-25 DIAGNOSIS — I10 Essential (primary) hypertension: Secondary | ICD-10-CM | POA: Diagnosis not present

## 2024-03-25 DIAGNOSIS — Z8673 Personal history of transient ischemic attack (TIA), and cerebral infarction without residual deficits: Secondary | ICD-10-CM | POA: Diagnosis not present

## 2024-03-25 DIAGNOSIS — Z79891 Long term (current) use of opiate analgesic: Secondary | ICD-10-CM | POA: Diagnosis not present

## 2024-03-25 DIAGNOSIS — S72142K Displaced intertrochanteric fracture of left femur, subsequent encounter for closed fracture with nonunion: Secondary | ICD-10-CM | POA: Diagnosis not present

## 2024-03-25 DIAGNOSIS — E782 Mixed hyperlipidemia: Secondary | ICD-10-CM | POA: Diagnosis not present

## 2024-03-25 DIAGNOSIS — Z9181 History of falling: Secondary | ICD-10-CM | POA: Diagnosis not present

## 2024-03-28 DIAGNOSIS — S73015D Posterior dislocation of left hip, subsequent encounter: Secondary | ICD-10-CM | POA: Diagnosis not present

## 2024-04-01 DIAGNOSIS — D500 Iron deficiency anemia secondary to blood loss (chronic): Secondary | ICD-10-CM | POA: Diagnosis not present

## 2024-04-01 DIAGNOSIS — E538 Deficiency of other specified B group vitamins: Secondary | ICD-10-CM | POA: Diagnosis not present

## 2024-04-01 DIAGNOSIS — E782 Mixed hyperlipidemia: Secondary | ICD-10-CM | POA: Diagnosis not present

## 2024-04-01 DIAGNOSIS — S72142K Displaced intertrochanteric fracture of left femur, subsequent encounter for closed fracture with nonunion: Secondary | ICD-10-CM | POA: Diagnosis not present

## 2024-04-01 DIAGNOSIS — I10 Essential (primary) hypertension: Secondary | ICD-10-CM | POA: Diagnosis not present

## 2024-04-01 DIAGNOSIS — G4733 Obstructive sleep apnea (adult) (pediatric): Secondary | ICD-10-CM | POA: Diagnosis not present

## 2024-04-08 DIAGNOSIS — I10 Essential (primary) hypertension: Secondary | ICD-10-CM | POA: Diagnosis not present

## 2024-04-08 DIAGNOSIS — D500 Iron deficiency anemia secondary to blood loss (chronic): Secondary | ICD-10-CM | POA: Diagnosis not present

## 2024-04-08 DIAGNOSIS — E538 Deficiency of other specified B group vitamins: Secondary | ICD-10-CM | POA: Diagnosis not present

## 2024-04-08 DIAGNOSIS — E782 Mixed hyperlipidemia: Secondary | ICD-10-CM | POA: Diagnosis not present

## 2024-04-08 DIAGNOSIS — S72142K Displaced intertrochanteric fracture of left femur, subsequent encounter for closed fracture with nonunion: Secondary | ICD-10-CM | POA: Diagnosis not present

## 2024-04-08 DIAGNOSIS — G4733 Obstructive sleep apnea (adult) (pediatric): Secondary | ICD-10-CM | POA: Diagnosis not present

## 2024-04-15 DIAGNOSIS — S72142K Displaced intertrochanteric fracture of left femur, subsequent encounter for closed fracture with nonunion: Secondary | ICD-10-CM | POA: Diagnosis not present

## 2024-04-15 DIAGNOSIS — E538 Deficiency of other specified B group vitamins: Secondary | ICD-10-CM | POA: Diagnosis not present

## 2024-04-15 DIAGNOSIS — D500 Iron deficiency anemia secondary to blood loss (chronic): Secondary | ICD-10-CM | POA: Diagnosis not present

## 2024-04-15 DIAGNOSIS — E782 Mixed hyperlipidemia: Secondary | ICD-10-CM | POA: Diagnosis not present

## 2024-04-15 DIAGNOSIS — G4733 Obstructive sleep apnea (adult) (pediatric): Secondary | ICD-10-CM | POA: Diagnosis not present

## 2024-04-15 DIAGNOSIS — I10 Essential (primary) hypertension: Secondary | ICD-10-CM | POA: Diagnosis not present

## 2024-04-24 DIAGNOSIS — I10 Essential (primary) hypertension: Secondary | ICD-10-CM | POA: Diagnosis not present

## 2024-04-24 DIAGNOSIS — Z9181 History of falling: Secondary | ICD-10-CM | POA: Diagnosis not present

## 2024-04-24 DIAGNOSIS — D500 Iron deficiency anemia secondary to blood loss (chronic): Secondary | ICD-10-CM | POA: Diagnosis not present

## 2024-04-24 DIAGNOSIS — D649 Anemia, unspecified: Secondary | ICD-10-CM | POA: Diagnosis not present

## 2024-04-24 DIAGNOSIS — G4733 Obstructive sleep apnea (adult) (pediatric): Secondary | ICD-10-CM | POA: Diagnosis not present

## 2024-04-24 DIAGNOSIS — E538 Deficiency of other specified B group vitamins: Secondary | ICD-10-CM | POA: Diagnosis not present

## 2024-04-24 DIAGNOSIS — S72142K Displaced intertrochanteric fracture of left femur, subsequent encounter for closed fracture with nonunion: Secondary | ICD-10-CM | POA: Diagnosis not present

## 2024-04-24 DIAGNOSIS — Z79891 Long term (current) use of opiate analgesic: Secondary | ICD-10-CM | POA: Diagnosis not present

## 2024-04-24 DIAGNOSIS — Z8673 Personal history of transient ischemic attack (TIA), and cerebral infarction without residual deficits: Secondary | ICD-10-CM | POA: Diagnosis not present

## 2024-04-24 DIAGNOSIS — I700 Atherosclerosis of aorta: Secondary | ICD-10-CM | POA: Diagnosis not present

## 2024-04-24 DIAGNOSIS — K5900 Constipation, unspecified: Secondary | ICD-10-CM | POA: Diagnosis not present

## 2024-04-24 DIAGNOSIS — E782 Mixed hyperlipidemia: Secondary | ICD-10-CM | POA: Diagnosis not present

## 2024-04-29 DIAGNOSIS — S72142K Displaced intertrochanteric fracture of left femur, subsequent encounter for closed fracture with nonunion: Secondary | ICD-10-CM | POA: Diagnosis not present

## 2024-04-29 DIAGNOSIS — D500 Iron deficiency anemia secondary to blood loss (chronic): Secondary | ICD-10-CM | POA: Diagnosis not present

## 2024-04-29 DIAGNOSIS — E782 Mixed hyperlipidemia: Secondary | ICD-10-CM | POA: Diagnosis not present

## 2024-04-29 DIAGNOSIS — E538 Deficiency of other specified B group vitamins: Secondary | ICD-10-CM | POA: Diagnosis not present

## 2024-04-29 DIAGNOSIS — G4733 Obstructive sleep apnea (adult) (pediatric): Secondary | ICD-10-CM | POA: Diagnosis not present

## 2024-04-29 DIAGNOSIS — I10 Essential (primary) hypertension: Secondary | ICD-10-CM | POA: Diagnosis not present

## 2024-05-06 DIAGNOSIS — G4733 Obstructive sleep apnea (adult) (pediatric): Secondary | ICD-10-CM | POA: Diagnosis not present

## 2024-05-06 DIAGNOSIS — E782 Mixed hyperlipidemia: Secondary | ICD-10-CM | POA: Diagnosis not present

## 2024-05-06 DIAGNOSIS — I10 Essential (primary) hypertension: Secondary | ICD-10-CM | POA: Diagnosis not present

## 2024-05-06 DIAGNOSIS — E538 Deficiency of other specified B group vitamins: Secondary | ICD-10-CM | POA: Diagnosis not present

## 2024-05-06 DIAGNOSIS — S72142K Displaced intertrochanteric fracture of left femur, subsequent encounter for closed fracture with nonunion: Secondary | ICD-10-CM | POA: Diagnosis not present

## 2024-05-06 DIAGNOSIS — D500 Iron deficiency anemia secondary to blood loss (chronic): Secondary | ICD-10-CM | POA: Diagnosis not present

## 2024-05-15 ENCOUNTER — Encounter: Payer: Self-pay | Admitting: Student

## 2024-05-19 DIAGNOSIS — S72142K Displaced intertrochanteric fracture of left femur, subsequent encounter for closed fracture with nonunion: Secondary | ICD-10-CM | POA: Diagnosis not present

## 2024-05-19 DIAGNOSIS — D500 Iron deficiency anemia secondary to blood loss (chronic): Secondary | ICD-10-CM | POA: Diagnosis not present

## 2024-05-19 DIAGNOSIS — E782 Mixed hyperlipidemia: Secondary | ICD-10-CM | POA: Diagnosis not present

## 2024-05-19 DIAGNOSIS — I10 Essential (primary) hypertension: Secondary | ICD-10-CM | POA: Diagnosis not present

## 2024-05-19 DIAGNOSIS — G4733 Obstructive sleep apnea (adult) (pediatric): Secondary | ICD-10-CM | POA: Diagnosis not present

## 2024-05-19 DIAGNOSIS — E538 Deficiency of other specified B group vitamins: Secondary | ICD-10-CM | POA: Diagnosis not present

## 2024-05-29 DIAGNOSIS — Z96642 Presence of left artificial hip joint: Secondary | ICD-10-CM | POA: Diagnosis not present

## 2024-06-24 DIAGNOSIS — L97121 Non-pressure chronic ulcer of left thigh limited to breakdown of skin: Secondary | ICD-10-CM | POA: Diagnosis not present

## 2024-06-24 DIAGNOSIS — R972 Elevated prostate specific antigen [PSA]: Secondary | ICD-10-CM | POA: Diagnosis not present

## 2024-06-24 DIAGNOSIS — Z1331 Encounter for screening for depression: Secondary | ICD-10-CM | POA: Diagnosis not present

## 2024-06-24 DIAGNOSIS — Z139 Encounter for screening, unspecified: Secondary | ICD-10-CM | POA: Diagnosis not present

## 2024-06-24 DIAGNOSIS — I10 Essential (primary) hypertension: Secondary | ICD-10-CM | POA: Diagnosis not present

## 2024-06-24 DIAGNOSIS — E782 Mixed hyperlipidemia: Secondary | ICD-10-CM | POA: Diagnosis not present

## 2024-06-24 DIAGNOSIS — Z23 Encounter for immunization: Secondary | ICD-10-CM | POA: Diagnosis not present

## 2024-06-24 DIAGNOSIS — M545 Low back pain, unspecified: Secondary | ICD-10-CM | POA: Diagnosis not present

## 2024-06-24 DIAGNOSIS — Z9181 History of falling: Secondary | ICD-10-CM | POA: Diagnosis not present

## 2024-06-24 DIAGNOSIS — T8149XA Infection following a procedure, other surgical site, initial encounter: Secondary | ICD-10-CM | POA: Diagnosis not present

## 2024-06-24 DIAGNOSIS — D500 Iron deficiency anemia secondary to blood loss (chronic): Secondary | ICD-10-CM | POA: Diagnosis not present

## 2024-06-26 DIAGNOSIS — Z96642 Presence of left artificial hip joint: Secondary | ICD-10-CM | POA: Diagnosis not present

## 2024-06-26 DIAGNOSIS — T8131XA Disruption of external operation (surgical) wound, not elsewhere classified, initial encounter: Secondary | ICD-10-CM | POA: Diagnosis not present

## 2024-06-26 DIAGNOSIS — T8484XD Pain due to internal orthopedic prosthetic devices, implants and grafts, subsequent encounter: Secondary | ICD-10-CM | POA: Diagnosis not present

## 2024-07-04 NOTE — Progress Notes (Signed)
 Attempted to obtain medical history via telephone, unable to reach at this time. HIPAA compliant voicemail message left requesting return call to pre surgical testing department.

## 2024-07-06 ENCOUNTER — Encounter (HOSPITAL_COMMUNITY): Payer: Self-pay

## 2024-07-07 ENCOUNTER — Encounter (HOSPITAL_COMMUNITY): Payer: Self-pay

## 2024-07-07 ENCOUNTER — Inpatient Hospital Stay (HOSPITAL_COMMUNITY): Admission: RE | Admit: 2024-07-07 | Admitting: Orthopedic Surgery

## 2024-07-07 ENCOUNTER — Encounter (HOSPITAL_COMMUNITY): Admission: RE | Payer: Self-pay | Source: Home / Self Care

## 2024-07-07 DIAGNOSIS — R972 Elevated prostate specific antigen [PSA]: Secondary | ICD-10-CM | POA: Insufficient documentation

## 2024-07-07 SURGERY — INCISION AND DRAINAGE OF DEEP ABSCESS, HIP JOINT, POSTERIOR APPROACH
Anesthesia: Spinal | Laterality: Left

## 2024-07-07 NOTE — Assessment & Plan Note (Deleted)
 PSA 4.2 (04/23/2023) PSA 4.1 (06/04/2023) PSA 7.5 (Aug 2025)  We discussed the significance of an elevated prostate-specific antigen (PSA) level. PSA is a nonspecific marker and may be elevated due to both benign and malignant causes. Benign factors include benign prostatic hyperplasia (BPH), prostatitis or urinary tract infection, recent ejaculation, catheterization or instrumentation, and advancing age. Malignant causes include prostate cancer of varying risk categories.  We reviewed that a single PSA value is less informative than following PSA trends over time, which can better reflect underlying pathology. Risk factors for prostate cancer include increasing age, family history of prostate cancer, African American race, and known germline mutations (e.g., BRCA2).  Next steps may include repeating PSA to confirm elevation, consideration of additional biomarkers or PSA derivatives (e.g., %free PSA, PSA density), and obtaining a multiparametric prostate MRI to assess for suspicious lesions. Based on PSA kinetics, risk profile, and MRI findings, a prostate biopsy may be recommended for definitive diagnosis.  - Proceed with prostate MRI.  If positive, we will proceed with targeted fusion prostate biopsy.  If negative, we will proceed with 12 core systematic biopsy template.  I reviewed the rationale for imaging and possible outcomes including future procedures with him today.  Will communicate via MyChart regarding MRI results

## 2024-07-07 NOTE — Progress Notes (Deleted)
 07/07/2024 7:26 AM   Jonathon Anderson [birth year] [id number]   HPI: 68 y.o. male here for initial evaluation of elevated PSA Reviewed outside scanned referral records  - Was initially referred to urology in November 2024 although he never scheduled due to ongoing orthopedic surgery  PSA 4.2 (04/23/2023) PSA 4.1 (06/04/2023) PSA 7.5 (Aug 2025)  Complicated orthopedic surgery for left hip in July 2025  - Required hospitalization irrigation, debridement and wound VAC  - Postop hypoxia, anemia, requiring transfusions  Current smoker for 40 years, typically half pack to 1/3/day    PMH: Past Medical History:  Diagnosis Date   Arthritis    Hyperlipidemia    Hypertension    Nicotine dependence    Stroke (HCC)    LIGHT STROKE - NUMBNESS AND TINGLING IN LEFT HAND - 10/2008 APPROX    Surgical History: Past Surgical History:  Procedure Laterality Date   APPLICATION OF WOUND VAC Left 01/09/2024   Procedure: APPLICATION, WOUND VAC;  Surgeon: Edna Toribio LABOR, MD;  Location: [facility];  Service: Orthopedics;  Laterality: Left;   APPLICATION OF WOUND VAC Left 02/18/2024   Procedure: APPLICATION, WOUND VAC;  Surgeon: Edna Toribio LABOR, MD;  Location: [facility];  Service: Orthopedics;  Laterality: Left;   CONVERSION TO TOTAL HIP Left 01/09/2024   Procedure: CONVERSION TO TOTAL HIP;  Surgeon: Edna Toribio LABOR, MD;  Location: [facility];  Service: Orthopedics;  Laterality: Left;   FEMUR IM NAIL Left 07/27/2022   Procedure: REPAIR FEMUR WITH AUTOGRAFT/ RIA;  Surgeon: Celena Sharper, MD;  Location: [facility] OR;  Service: Orthopedics;  Laterality: Left;   HARDWARE REMOVAL Left 06/06/2022   Procedure: HARDWARE REMOVAL;  Surgeon: Celena Sharper, MD;  Location: [facility] OR;  Service: Orthopedics;  Laterality: Left;   HARDWARE REMOVAL Bilateral 07/27/2022   Procedure: REPAIR OF LEFT PROXIMAL FEMUR NONUNION, REMOVAL OF ANTIBIOTIC SPACER, REAMED INTERMEDULLARY ASPRIATE RIGHT FEMUR;  Surgeon: Celena Sharper, MD;   Location: [facility] OR;  Service: Orthopedics;  Laterality: Bilateral;   HIP CLOSED REDUCTION Left 01/19/2024   Procedure: Attempted CLOSED REDUCTION, HIP;  Surgeon: Cristy Bonner DASEN, MD;  Location: [facility] OR;  Service: Orthopedics;  Laterality: Left;   HIP CLOSED REDUCTION  02/18/2024   Procedure: CLOSED REDUCTION, HIP;  Surgeon: Edna Toribio LABOR, MD;  Location: [facility];  Service: Orthopedics;;   INCISION AND DRAINAGE OF WOUND Left 02/18/2024   Procedure: IRRIGATION AND DEBRIDEMENT WOUND;  Surgeon: Edna Toribio LABOR, MD;  Location: [facility];  Service: Orthopedics;  Laterality: Left;   INTRAMEDULLARY (IM) NAIL INTERTROCHANTERIC Left 05/11/2021   Procedure: INTRAMEDULLARY (IM) NAIL INTERTROCHANTRIC OF LEFT SUBTHROCHANTRIC HIP FRACTURE;  Surgeon: Edie Norleen PARAS, MD;  Location: [facility] ORS;  Service: Orthopedics;  Laterality: Left;   ORIF FEMUR FRACTURE Left 06/06/2022   Procedure: OPEN REDUCTION INTERNAL FIXATION FEMORAL PROXIMAL FRACTURE;  Surgeon: Celena Sharper, MD;  Location: [facility] OR;  Service: Orthopedics;  Laterality: Left;   TOTAL HIP REVISION Left 01/21/2024   Procedure: Open Hip Reduction with Local Tissue Advancement;  Surgeon: Edna Toribio LABOR, MD;  Location: [facility] OR;  Service: Orthopedics;  Laterality: Left;    Family History: Family History  Problem Relation Age of Onset   Heart attack Mother    Hypertension Brother    Heart failure Paternal Grandfather     Social History:  reports that he quit smoking about 2 years ago. His smoking use included cigarettes. He smoked an average of 0.5 packs per day. He has never used smokeless tobacco. He reports current alcohol   use. He reports that he does not use drugs.      Physical Exam: There were no vitals taken for this visit.   Constitutional:  Alert and oriented, No acute distress. Cardiovascular: No clubbing, cyanosis, or edema. Respiratory: Normal respiratory effort, no increased work of breathing. GI: Nondistended GU: *** Skin: No rashes,  bruises or suspicious lesions. Neurologic: Grossly intact, no focal deficits, moving all 4 extremities. Psychiatric: Normal mood and affect.  Laboratory Data: PSA 4.2 (04/23/2023) PSA 4.1 (06/04/2023) PSA 7.5 (Aug 2025)   Pertinent Imaging: N/A    Assessment & Plan:    Elevated PSA Assessment & Plan: PSA 4.2 (04/23/2023) PSA 4.1 (06/04/2023) PSA 7.5 (Aug 2025)  We discussed the significance of an elevated prostate-specific antigen (PSA) level. PSA is a nonspecific marker and may be elevated due to both benign and malignant causes. Benign factors include benign prostatic hyperplasia (BPH), prostatitis or urinary tract infection, recent ejaculation, catheterization or instrumentation, and advancing age. Malignant causes include prostate cancer of varying risk categories.  We reviewed that a single PSA value is less informative than following PSA trends over time, which can better reflect underlying pathology. Risk factors for prostate cancer include increasing age, family history of prostate cancer, African American race, and known germline mutations (e.g., BRCA2).  Next steps may include repeating PSA to confirm elevation, consideration of additional biomarkers or PSA derivatives (e.g., %free PSA, PSA density), and obtaining a multiparametric prostate MRI to assess for suspicious lesions. Based on PSA kinetics, risk profile, and MRI findings, a prostate biopsy may be recommended for definitive diagnosis.  - Proceed with prostate MRI.  If positive, we will proceed with targeted fusion prostate biopsy.  If negative, we will proceed with 12 core systematic biopsy template.  I reviewed the rationale for imaging and possible outcomes including future procedures with him today.  Will communicate via MyChart regarding MRI results       Penne Skye, MD 07/07/2024  [facility] [address] [place], [place] [address] [phone]

## 2024-07-09 ENCOUNTER — Ambulatory Visit: Admitting: Urology

## 2024-07-29 NOTE — Assessment & Plan Note (Addendum)
 PSA 4.2 (04/23/2023) PSA 4.1 (06/04/2023) PSA 7.5 (Aug 2025) Fhx+ CaP in maternal uncle  We discussed the significance of an elevated prostate-specific antigen (PSA) level. PSA is a nonspecific marker and may be elevated due to both benign and malignant causes. Benign factors include benign prostatic hyperplasia (BPH), prostatitis or urinary tract infection, recent ejaculation, catheterization or instrumentation, and advancing age. Malignant causes include prostate cancer of varying risk categories.  We reviewed that a single PSA value is less informative than following PSA trends over time, which can better reflect underlying pathology. Risk factors for prostate cancer include increasing age, family history of prostate cancer, African American race, and known germline mutations (e.g., BRCA2).  Next steps may include repeating PSA to confirm elevation, consideration of additional biomarkers or PSA derivatives (e.g., %free PSA, PSA density), and obtaining a multiparametric prostate MRI to assess for suspicious lesions. Based on PSA kinetics, risk profile, and MRI findings, a prostate biopsy may be recommended for definitive diagnosis.  - Typically, would proceed with dedicated prostate MRI.  Although he has indwelling left orthopedic hip hardware-which may cause artifact.  Additionally, history of frequent no-shows. As such, discussed moving forward with 12 core systematic biopsy.  Reviewed pros and cons of each.  He preferred to move forward with upfront biopsy. -Schedule next available 12 core systematic biopsy, he will need to hold baby aspirin  5 days prior.  Patient instructions provided - interval PSA today -Follow-up with me 1 week postprocedure for pathology review

## 2024-07-29 NOTE — Progress Notes (Signed)
 "  08/01/2024 11:03 AM   Jonathon Anderson [birth year] [id number]   HPI: 69 y.o. male here for initial evaluation of elevated PSA Reviewed outside scanned referral records  - Was initially referred to urology in November 2024 although he never scheduled due to ongoing orthopedic surgery  PSA 4.2 (04/23/2023) PSA 4.1 (06/04/2023) PSA 7.5 (Aug 2025)  Complicated orthopedic surgery for left hip in July 2025  - Required hospitalization irrigation, debridement and wound VAC  - Postop hypoxia, anemia, requiring transfusions  Current smoker for 40 years, typically half pack to 1/3/day Family history of prostate cancer in maternal uncle, does not know his paternal history  Denies prior GU conditions, prostatitis, nephrolithiasis, gross hematuria Denies prior GU surgeries Has never seen a urologist    PMH: Past Medical History:  Diagnosis Date   Arthritis    Hyperlipidemia    Hypertension    Nicotine dependence    Stroke (HCC)    LIGHT STROKE - NUMBNESS AND TINGLING IN LEFT HAND - 10/2008 APPROX    Surgical History: Past Surgical History:  Procedure Laterality Date   APPLICATION OF WOUND VAC Left 01/09/2024   Procedure: APPLICATION, WOUND VAC;  Surgeon: Edna Toribio LABOR, MD;  Location: [facility];  Service: Orthopedics;  Laterality: Left;   APPLICATION OF WOUND VAC Left 02/18/2024   Procedure: APPLICATION, WOUND VAC;  Surgeon: Edna Toribio LABOR, MD;  Location: [facility];  Service: Orthopedics;  Laterality: Left;   CONVERSION TO TOTAL HIP Left 01/09/2024   Procedure: CONVERSION TO TOTAL HIP;  Surgeon: Edna Toribio LABOR, MD;  Location: [facility];  Service: Orthopedics;  Laterality: Left;   FEMUR IM NAIL Left 07/27/2022   Procedure: REPAIR FEMUR WITH AUTOGRAFT/ RIA;  Surgeon: Celena Sharper, MD;  Location: [facility];  Service: Orthopedics;  Laterality: Left;   HARDWARE REMOVAL Left 06/06/2022   Procedure: HARDWARE REMOVAL;  Surgeon: Celena Sharper, MD;  Location: [facility] OR;  Service:  Orthopedics;  Laterality: Left;   HARDWARE REMOVAL Bilateral 07/27/2022   Procedure: REPAIR OF LEFT PROXIMAL FEMUR NONUNION, REMOVAL OF ANTIBIOTIC SPACER, REAMED INTERMEDULLARY ASPRIATE RIGHT FEMUR;  Surgeon: Celena Sharper, MD;  Location: [facility] OR;  Service: Orthopedics;  Laterality: Bilateral;   HIP CLOSED REDUCTION Left 01/19/2024   Procedure: Attempted CLOSED REDUCTION, HIP;  Surgeon: Cristy Bonner DASEN, MD;  Location: [facility] OR;  Service: Orthopedics;  Laterality: Left;   HIP CLOSED REDUCTION  02/18/2024   Procedure: CLOSED REDUCTION, HIP;  Surgeon: Edna Toribio LABOR, MD;  Location: [facility];  Service: Orthopedics;;   INCISION AND DRAINAGE OF WOUND Left 02/18/2024   Procedure: IRRIGATION AND DEBRIDEMENT WOUND;  Surgeon: Edna Toribio LABOR, MD;  Location: [facility];  Service: Orthopedics;  Laterality: Left;   INTRAMEDULLARY (IM) NAIL INTERTROCHANTERIC Left 05/11/2021   Procedure: INTRAMEDULLARY (IM) NAIL INTERTROCHANTRIC OF LEFT SUBTHROCHANTRIC HIP FRACTURE;  Surgeon: Edie Norleen PARAS, MD;  Location: [facility];  Service: Orthopedics;  Laterality: Left;   ORIF FEMUR FRACTURE Left 06/06/2022   Procedure: OPEN REDUCTION INTERNAL FIXATION FEMORAL PROXIMAL FRACTURE;  Surgeon: Celena Sharper, MD;  Location: [facility] OR;  Service: Orthopedics;  Laterality: Left;   TOTAL HIP REVISION Left 01/21/2024   Procedure: Open Hip Reduction with Local Tissue Advancement;  Surgeon: Edna Toribio LABOR, MD;  Location: [facility] OR;  Service: Orthopedics;  Laterality: Left;    Family History: Family History  Problem Relation Age of Onset   Heart attack Mother    Hypertension Brother    Heart failure Paternal Grandfather     Social History:  reports that he quit smoking about 2 years ago. His smoking use included cigarettes. He smoked an average of 0.5 packs per day. He has never used smokeless tobacco. He reports current alcohol  use. He reports that he does not use drugs.      Physical Exam: BP (!) 150/87   Pulse 87   Ht 5' 8 (1.727  m)   Wt 180 lb (81.6 kg)   BMI 27.37 kg/m    Constitutional:  Alert and oriented, No acute distress. Cardiovascular: No clubbing, cyanosis, or edema. Respiratory: Normal respiratory effort, no increased work of breathing. GI: Nondistended GU: deferred, will perform with biopsy Skin: No rashes, bruises or suspicious lesions. Neurologic: Grossly intact, no focal deficits, moving all 4 extremities. Psychiatric: Normal mood and affect.  Laboratory Data: PSA 4.2 (04/23/2023) PSA 4.1 (06/04/2023) PSA 7.5 (Aug 2025)   Pertinent Imaging: N/A    Assessment & Plan:    Elevated PSA Assessment & Plan: PSA 4.2 (04/23/2023) PSA 4.1 (06/04/2023) PSA 7.5 (Aug 2025) Fhx+ CaP in maternal uncle  We discussed the significance of an elevated prostate-specific antigen (PSA) level. PSA is a nonspecific marker and may be elevated due to both benign and malignant causes. Benign factors include benign prostatic hyperplasia (BPH), prostatitis or urinary tract infection, recent ejaculation, catheterization or instrumentation, and advancing age. Malignant causes include prostate cancer of varying risk categories.  We reviewed that a single PSA value is less informative than following PSA trends over time, which can better reflect underlying pathology. Risk factors for prostate cancer include increasing age, family history of prostate cancer, African American race, and known germline mutations (e.g., BRCA2).  Next steps may include repeating PSA to confirm elevation, consideration of additional biomarkers or PSA derivatives (e.g., %free PSA, PSA density), and obtaining a multiparametric prostate MRI to assess for suspicious lesions. Based on PSA kinetics, risk profile, and MRI findings, a prostate biopsy may be recommended for definitive diagnosis.  - Typically, would proceed with dedicated prostate MRI.  Although he has indwelling left orthopedic hip hardware-which may cause artifact.  Additionally, history  of frequent no-shows. As such, discussed moving forward with 12 core systematic biopsy.  Reviewed pros and cons of each.  He preferred to move forward with upfront biopsy. -Schedule next available 12 core systematic biopsy, he will need to hold baby aspirin  5 days prior.  Patient instructions provided - interval PSA today -Follow-up with me 1 week postprocedure for pathology review  Orders: -     PSA; Future       Penne Skye, MD 08/01/2024  [facility] [address] [place], [place] [address] [phone] "

## 2024-08-01 ENCOUNTER — Other Ambulatory Visit: Payer: Self-pay

## 2024-08-01 ENCOUNTER — Ambulatory Visit (INDEPENDENT_AMBULATORY_CARE_PROVIDER_SITE_OTHER): Admitting: Urology

## 2024-08-01 VITALS — BP 150/87 | HR 87 | Ht 68.0 in | Wt 180.0 lb

## 2024-08-01 DIAGNOSIS — R972 Elevated prostate specific antigen [PSA]: Secondary | ICD-10-CM | POA: Diagnosis not present

## 2024-08-01 NOTE — Patient Instructions (Signed)
 Prostate Biopsy Instructions  Stop all aspirin or blood thinners (aspirin, plavix, coumadin, warfarin, motrin, ibuprofen, advil, aleve, naproxen, naprosyn) for 7 days prior to the procedure.  If you have any questions about stopping these medications, please contact your primary care physician or cardiologist.  Having a light meal prior to the procedure is recommended.  If you are diabetic or have low blood sugar please bring a small snack or glucose tablet.  A Fleets enema is needed to be purchased over the counter at a local pharmacy and used 2 hours before you scheduled appointment.  This can be purchased over the counter at any pharmacy.  Antibiotics will be administered in the clinic at the time of the procedure unless otherwise specified.    Please bring someone with you to the procedure to drive you home.  A follow up appointment has been scheduled for you to receive the results of the biopsy.  If you have any questions or concerns, please feel free to call the office at [phone] or send a Mychart message.    Thank you, Staff at [facility]

## 2024-08-02 LAB — PSA: Prostate Specific Ag, Serum: 5.5 ng/mL — ABNORMAL HIGH (ref 0.0–4.0)

## 2024-08-13 NOTE — Progress Notes (Unsigned)
" ° °  08/13/24  Indication:  PSA 4.2 (04/23/2023) PSA 4.1 (06/04/2023) PSA 7.5 (Aug 2025) Fhx+ CaP in maternal uncle  12-core TRUS Prostate Biopsy Procedure   Informed consent was obtained, and we discussed the risks of bleeding and infection/sepsis. A time out was performed to ensure correct patient identity.  Pre-Procedure: - Last PSA Level: No results found for: PSA - Gentamicin and levaquin given for antibiotic prophylaxis  Procedure: - DRE - *** - Periprostatic nerve block performed using 20 cc 1% lidocaine   - Anatomic measurements performed revealing a *** gm prostate, PSA density *** - No significant hypoechoic lesions or median lobe noted - Standard 12-core systematic biopsy template performed   Total biopsy cores: 12  Post-Procedure: - Patient tolerated the procedure well - He was counseled to seek immediate medical attention if experiences significant bleeding, fevers, urinary retention, or severe pain - Return in one week to discuss biopsy results  Assessment/ Plan: Will follow up in ~7-10 days to discuss pathology  Penne Skye, MD 08/13/2024   "

## 2024-08-18 ENCOUNTER — Other Ambulatory Visit: Admitting: Urology

## 2024-08-27 ENCOUNTER — Ambulatory Visit: Admitting: Urology

## 2024-08-27 ENCOUNTER — Telehealth: Payer: Self-pay

## 2024-08-27 NOTE — Telephone Encounter (Signed)
 Called patient to let him know we got his cardiac clearance back and to stop taking his Aspirin  7 days prior to his procedure. I informed patient he can stop taking is blood thinners starting tomorrow. Patient voiced understanding and had no further questions.-Taedyn Glasscock,CMA

## 2024-08-28 NOTE — Progress Notes (Unsigned)
" ° °  08/28/24  Indication:  PSA 4.2 (04/23/2023) PSA 4.1 (06/04/2023) PSA 7.5 (Aug 2025) PSA 5.5 (08/01/24) Fhx+ CaP in maternal uncle  12-core TRUS Prostate Biopsy Procedure   Informed consent was obtained, and we discussed the risks of bleeding and infection/sepsis. A time out was performed to ensure correct patient identity.  Pre-Procedure: - Last PSA Level: PSA 5.5 (08/01/24) - Gentamicin and levaquin given for antibiotic prophylaxis  Procedure: - DRE - *** - Periprostatic nerve block performed using 20 cc 1% lidocaine   - Anatomic measurements performed revealing a *** gm prostate, PSA density *** - No significant hypoechoic lesions or median lobe noted - Standard 12-core systematic biopsy template performed   Total biopsy cores: 12  Post-Procedure: - Patient tolerated the procedure well - He was counseled to seek immediate medical attention if experiences significant bleeding, fevers, urinary retention, or severe pain - Return in one week to discuss biopsy results  Assessment/ Plan: Will follow up in ~7-10 days to discuss pathology  Penne Skye, MD 08/28/2024   "

## 2024-09-03 ENCOUNTER — Other Ambulatory Visit: Admitting: Urology

## 2024-09-10 ENCOUNTER — Ambulatory Visit: Admitting: Urology
